# Patient Record
Sex: Female | Born: 1950
Health system: Southern US, Community
[De-identification: ages and names within clinical notes are randomized; demographics above are authoritative.]

## PROBLEM LIST (undated history)

## (undated) DIAGNOSIS — K219 Gastro-esophageal reflux disease without esophagitis: Secondary | ICD-10-CM

## (undated) DIAGNOSIS — R569 Unspecified convulsions: Secondary | ICD-10-CM

## (undated) DIAGNOSIS — R011 Cardiac murmur, unspecified: Secondary | ICD-10-CM

## (undated) DIAGNOSIS — C50919 Malignant neoplasm of unspecified site of unspecified female breast: Secondary | ICD-10-CM

## (undated) DIAGNOSIS — I10 Essential (primary) hypertension: Secondary | ICD-10-CM

## (undated) DIAGNOSIS — E7800 Pure hypercholesterolemia, unspecified: Secondary | ICD-10-CM

## (undated) HISTORY — PX: TUBAL LIGATION: SHX77

## (undated) HISTORY — DX: Cardiac murmur, unspecified: R01.1

## (undated) HISTORY — DX: Pure hypercholesterolemia, unspecified: E78.00

## (undated) HISTORY — DX: Essential (primary) hypertension: I10

## (undated) HISTORY — DX: Unspecified convulsions: R56.9

## (undated) HISTORY — DX: Gastro-esophageal reflux disease without esophagitis: K21.9

---

## 1976-12-21 HISTORY — PX: ABDOMINAL HYSTERECTOMY: SHX81

## 1989-12-21 DIAGNOSIS — C50919 Malignant neoplasm of unspecified site of unspecified female breast: Secondary | ICD-10-CM

## 1989-12-21 HISTORY — DX: Malignant neoplasm of unspecified site of unspecified female breast: C50.919

## 1989-12-21 HISTORY — PX: MASTECTOMY: SHX3

## 2005-05-06 ENCOUNTER — Ambulatory Visit: Payer: Self-pay

## 2006-07-28 ENCOUNTER — Ambulatory Visit: Payer: Self-pay

## 2008-07-10 ENCOUNTER — Ambulatory Visit: Payer: Self-pay

## 2008-07-10 IMAGING — MG MAM BCCCP UNILATERAL  RT W/CAD
1 series · 3 of 3 positions shown · non-contrast
Comparison: none

REASON FOR EXAM: History of left mastectomy in [P0]   [REDACTED]
COMMENTS:

PROCEDURE:     MAM - MAM SNOUN RT BREAST W/CAD  - [DATE]  [DATE]
RESULT:     Comparison is made to prior studies dated [DATE], [DATE]
and [DATE].
The breasts demonstrate a heterogeneous parenchymal pattern. There is no new
radiographic evidence to suggest malignancy.

[R CC · right · 3 of 3 slices shown]
[im 1/3]
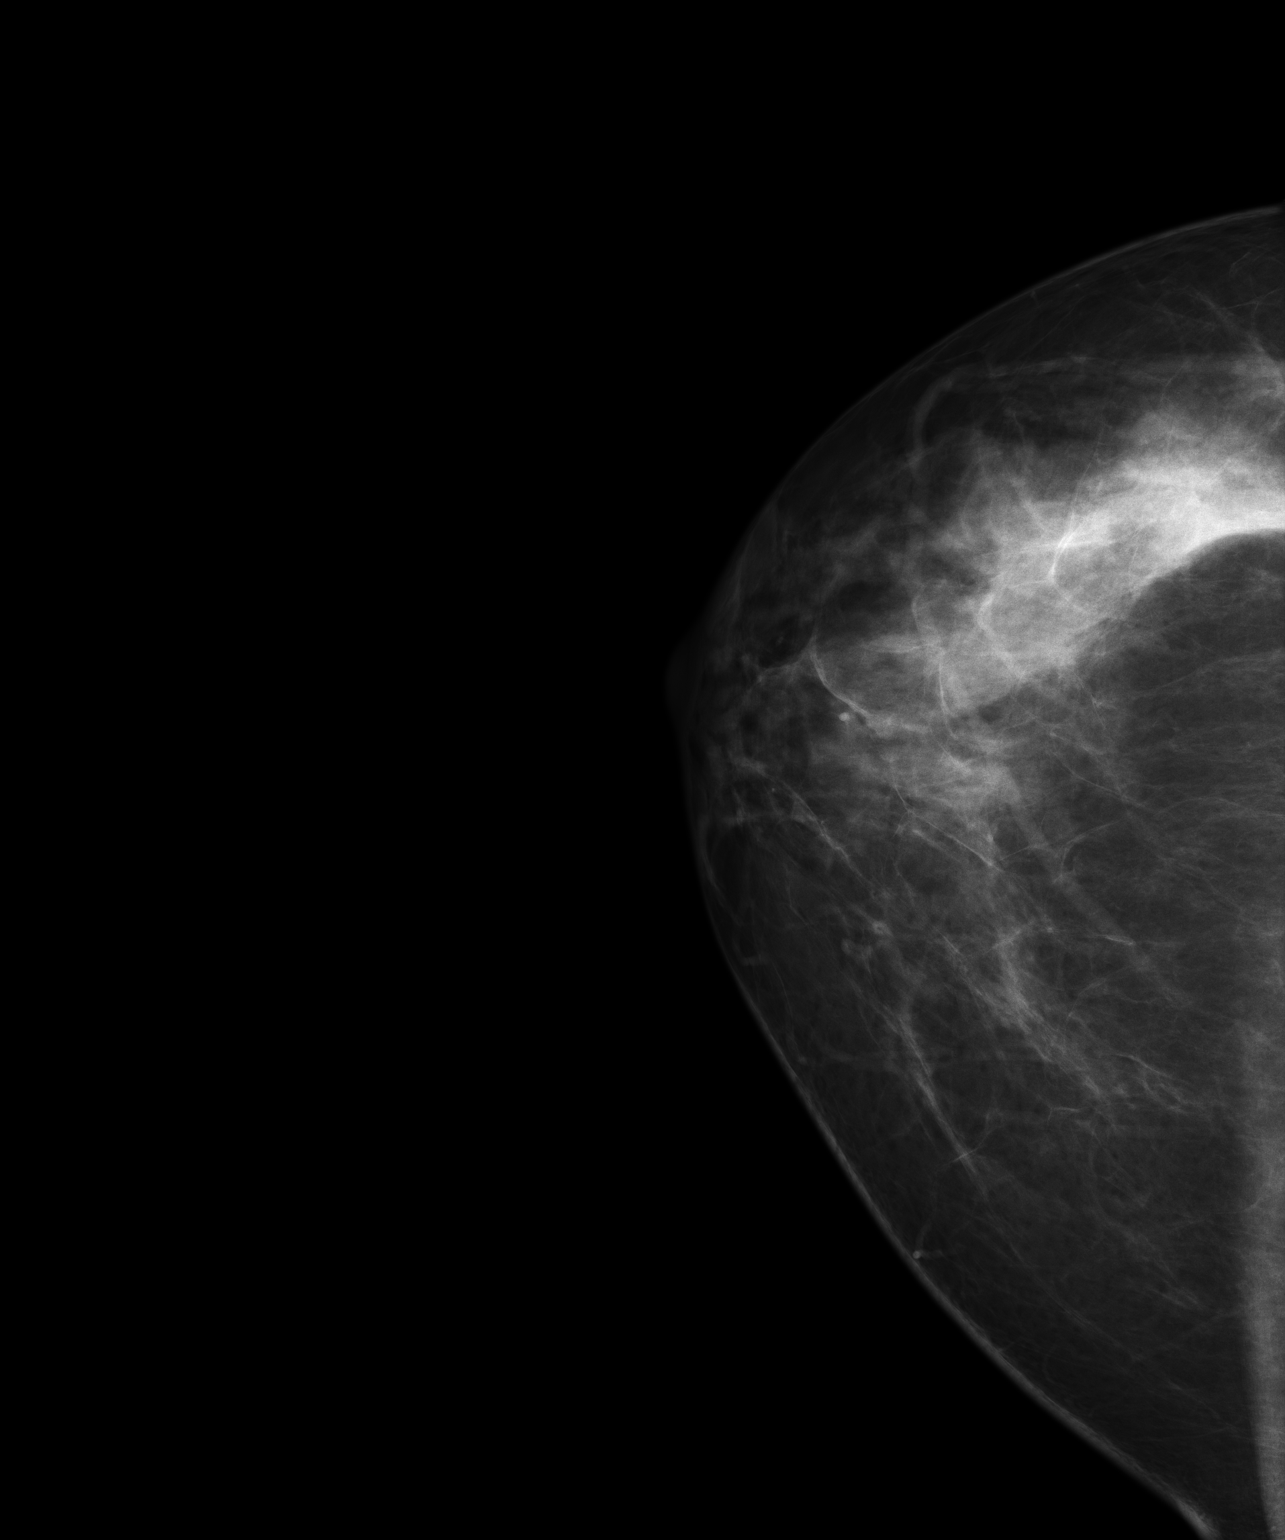
[im 2/3]
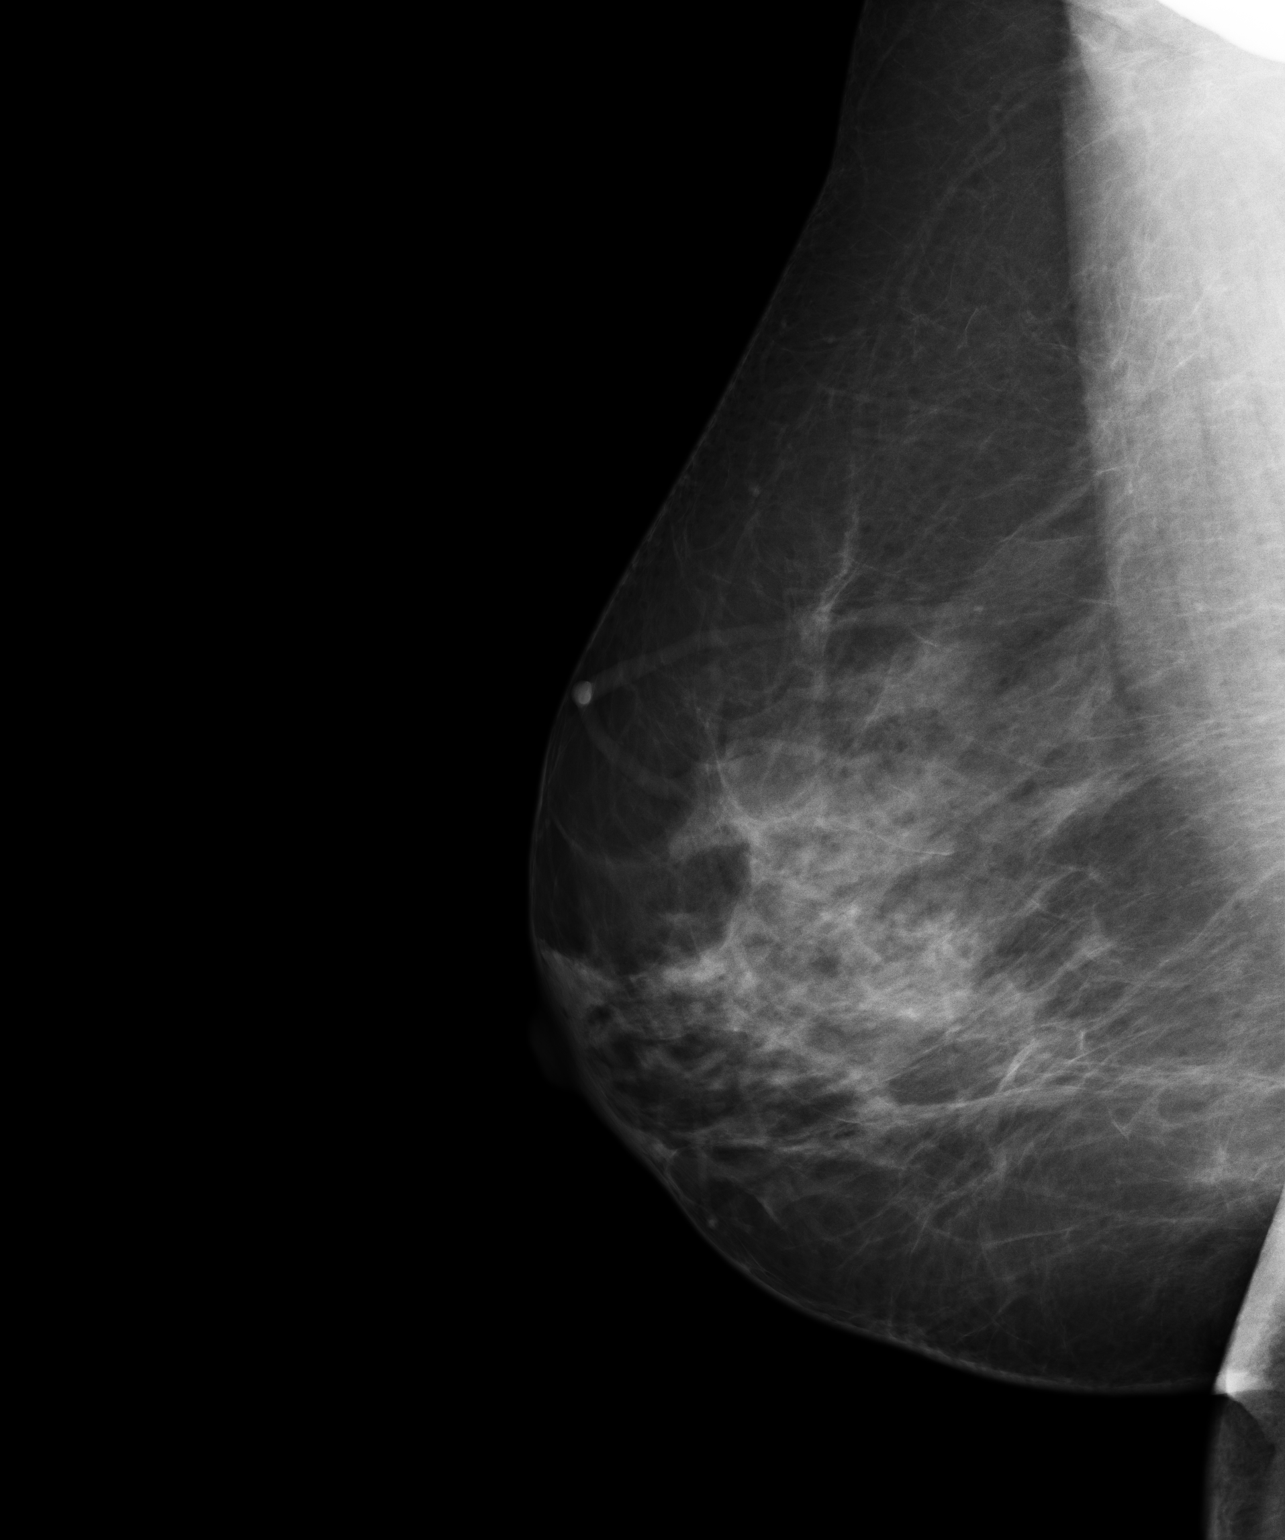
[im 3/3]
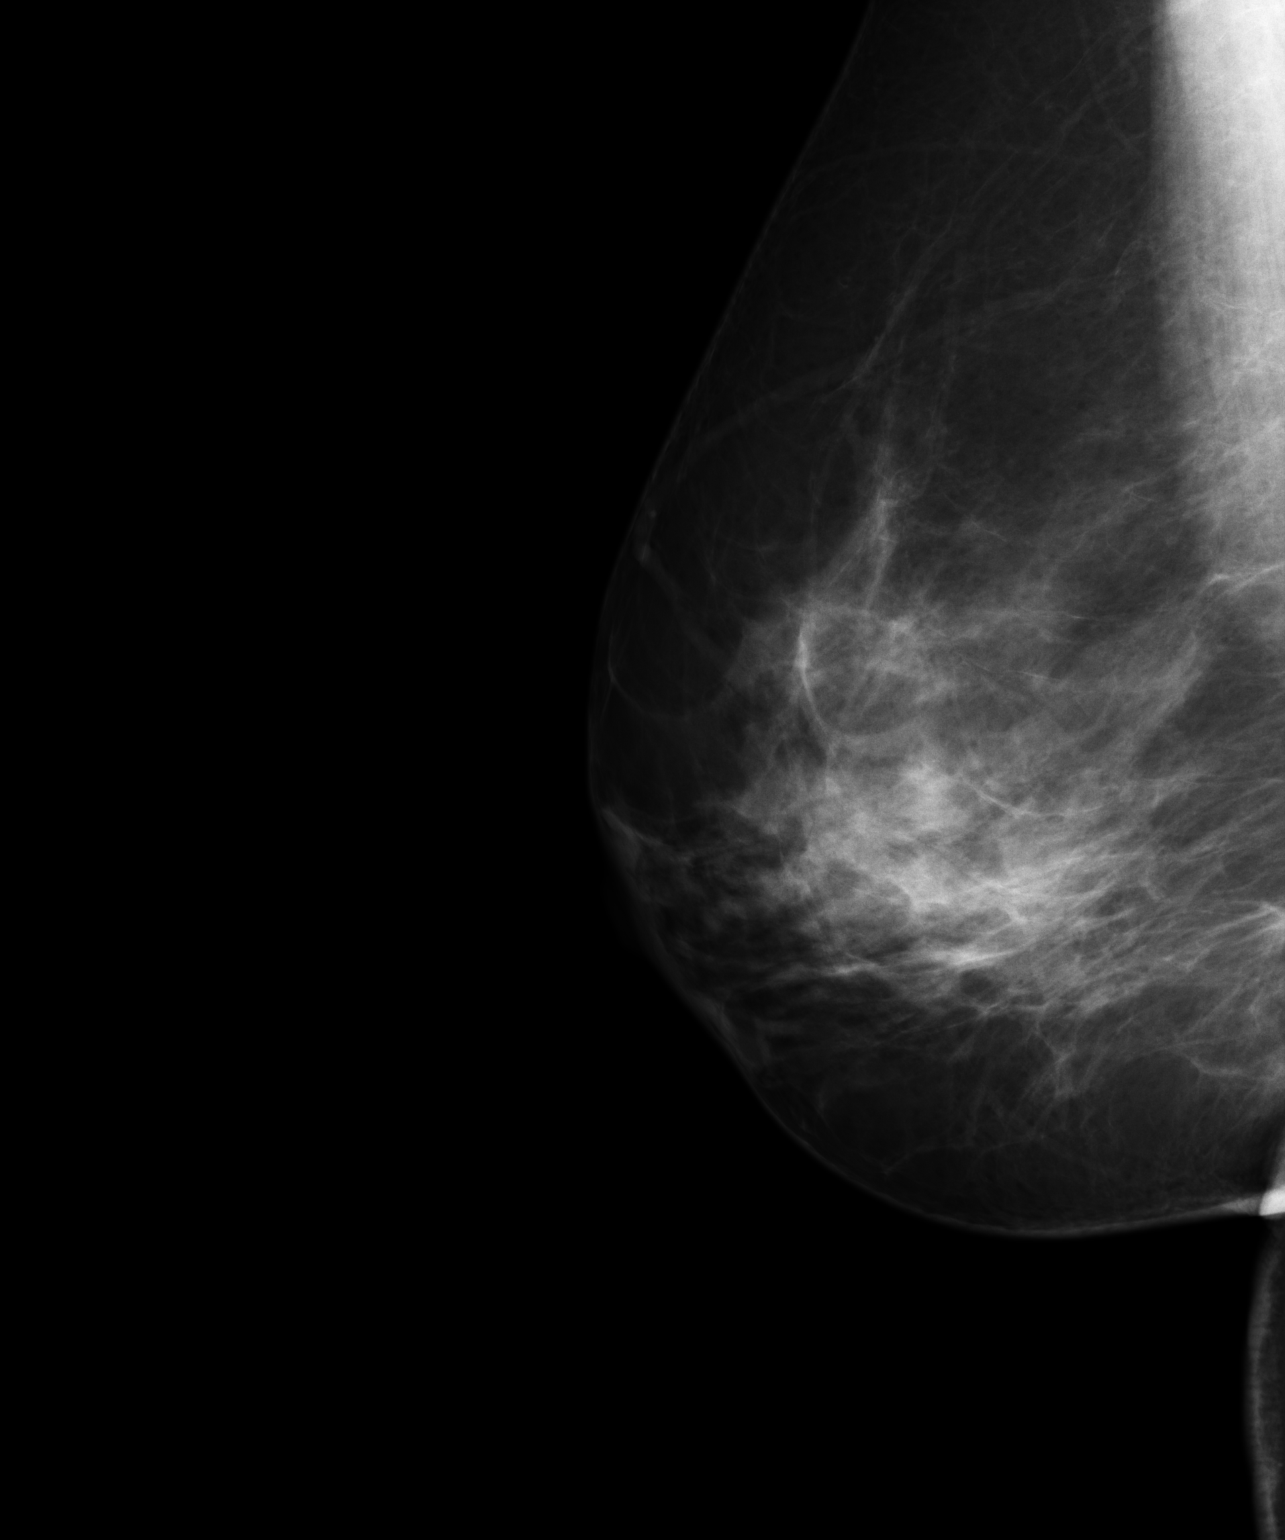

[3 of 3 positions shown; findings below may reference images not displayed]

IMPRESSION: BI-RADS: Category 2 - Benign Finding

Thank you for this opportunity to contribute to the care of your patient.

A NEGATIVE MAMMOGRAM REPORT DOES NOT PRECLUDE BIOPSY OR OTHER EVALUATION OF
A CLINICALLY PALPABLE OR OTHERWISE SUSPICIOUS MASS OR LESION. BREAST CANCER
MAY NOT BE DETECTED BY MAMMOGRAPHY IN UP TO 10% OF CASES.

## 2009-07-30 ENCOUNTER — Ambulatory Visit: Payer: Self-pay

## 2009-07-30 IMAGING — MG MAM BCCCP UNILATERAL RIGHT
1 series · 3 of 3 positions shown · non-contrast
Comparison: none

REASON FOR EXAM: history of left cancer with mastectomy [Y8] [REDACTED] YURIDIA
COMMENTS:

[R CC · right · 3 of 3 slices shown]
[im 1/3]
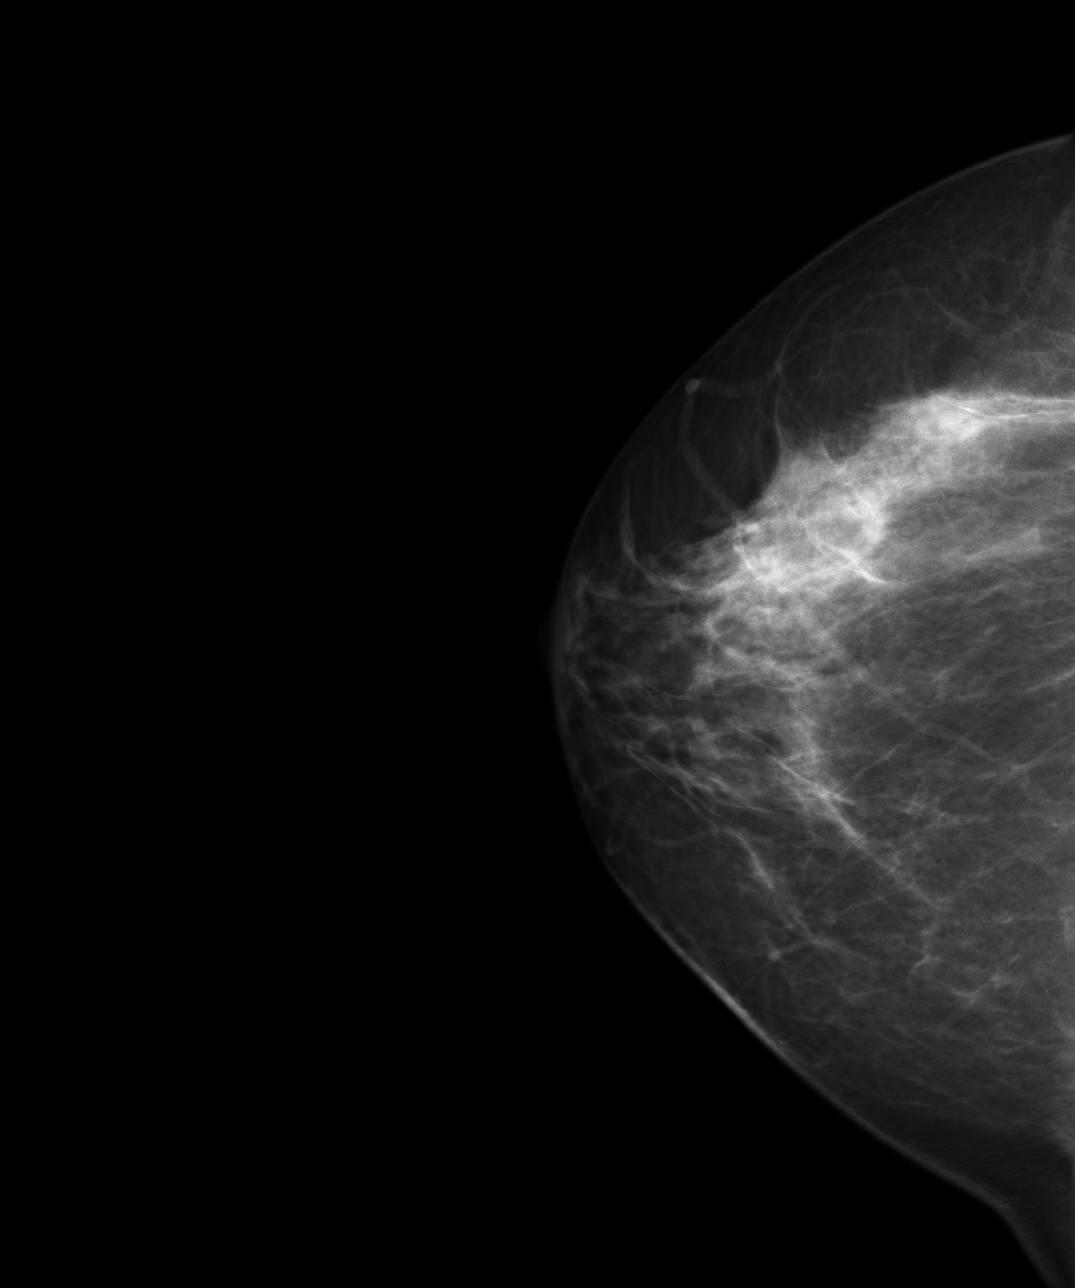
[im 2/3]
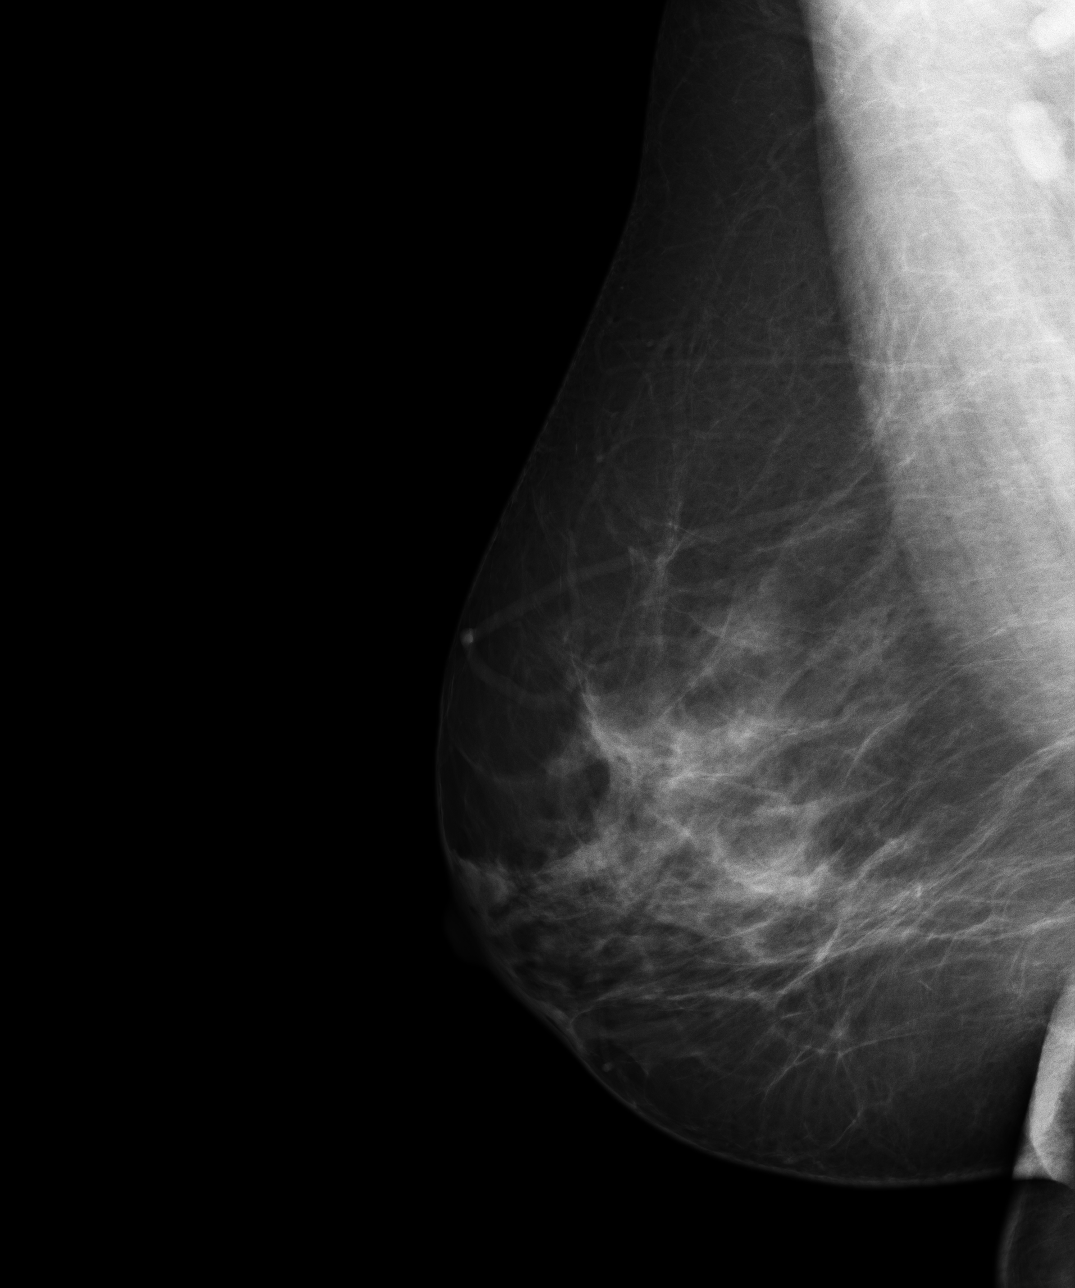
[im 3/3]
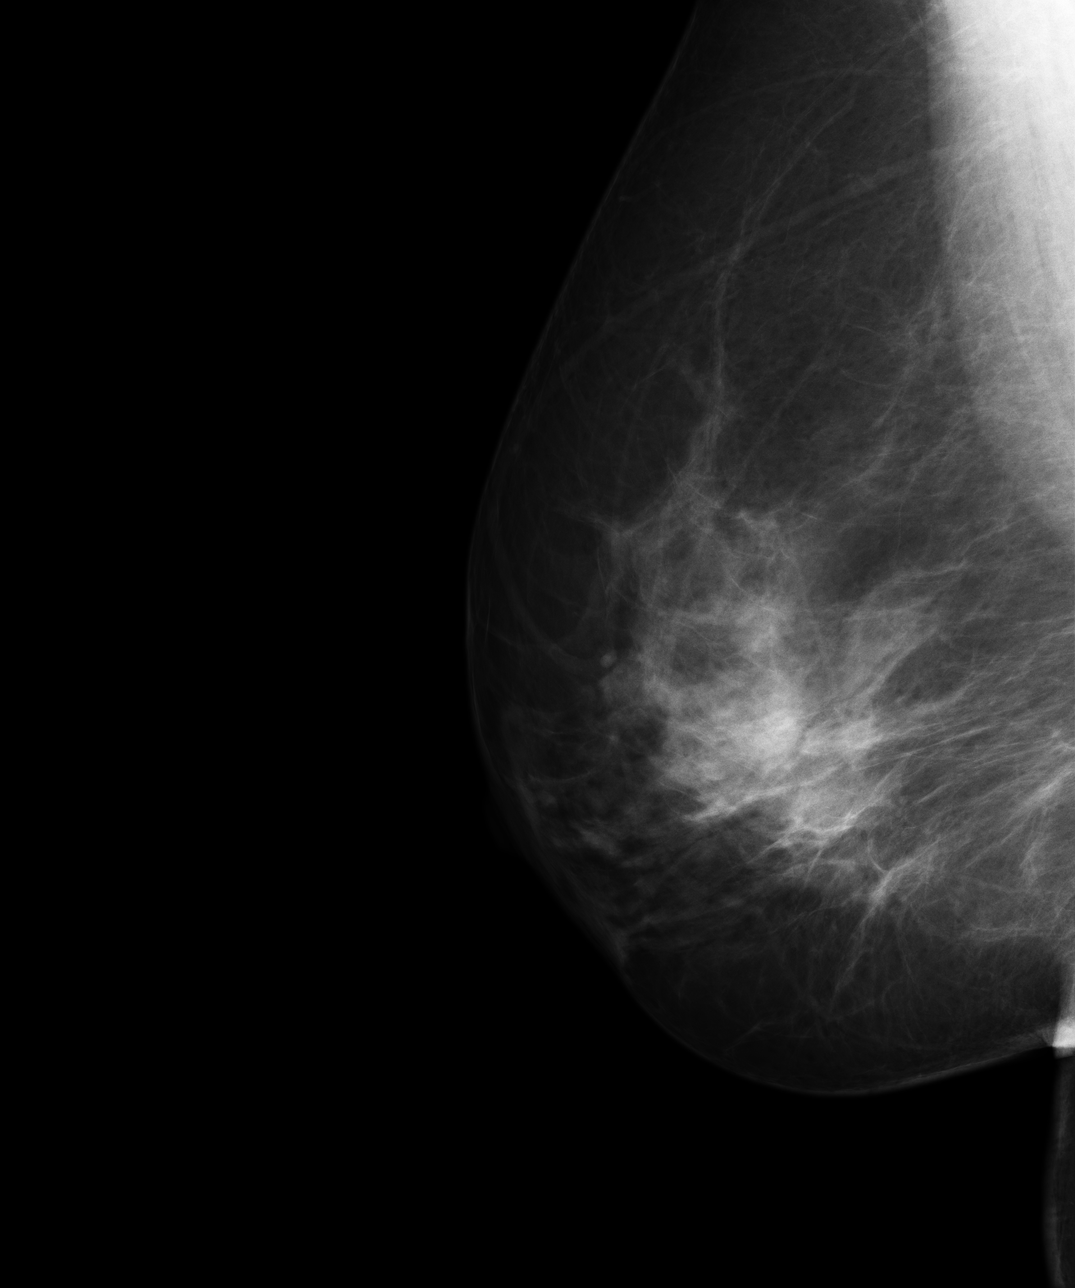

[3 of 3 positions shown; findings below may reference images not displayed]

PROCEDURE:     MAM - MAM YURIDIA RT BREAST W/CAD  - [DATE] [DATE]

RESULT:     Comparison is made to film screen images of [DATE] of the
right breast only and to digital images dated [DATE] as well as
[DATE] and [DATE] of the right breast.

The right breast exhibits a moderately dense parenchymal pattern which
appears to be stable without evidence of an area of developing density or
dominant mass. There is no malignant calcification or architectural
distortion.
IMPRESSION: Stable, benign appearing unilateral right breast mammogram.

BI-RADS: Category 2 - Benign Finding

RECOMMENDATION:  Please continue to encourage annual mammographic follow-up.

Thank you for this opportunity to contribute to the care of your patient.

A NEGATIVE MAMMOGRAM REPORT DOES NOT PRECLUDE BIOPSY OR OTHER EVALUATION OF
A CLINICALLY PALPABLE OR OTHERWISE SUSPICIOUS MASS OR LESION. BREAST CANCER
MAY NOT BE DETECTED BY MAMMOGRAPHY IN UP TO 10% OF CASES.

## 2010-01-31 ENCOUNTER — Emergency Department: Payer: Self-pay | Admitting: Emergency Medicine

## 2010-07-30 ENCOUNTER — Ambulatory Visit: Payer: Self-pay

## 2010-07-30 IMAGING — MG MAM BCCCP DIG UNI RT W/CAD
1 series · 3 of 3 positions shown · non-contrast
Comparison: none

REASON FOR EXAM: history of left mastectomy [1I]
COMMENTS:

[R CC · right · 3 of 3 slices shown]
[im 1/3]
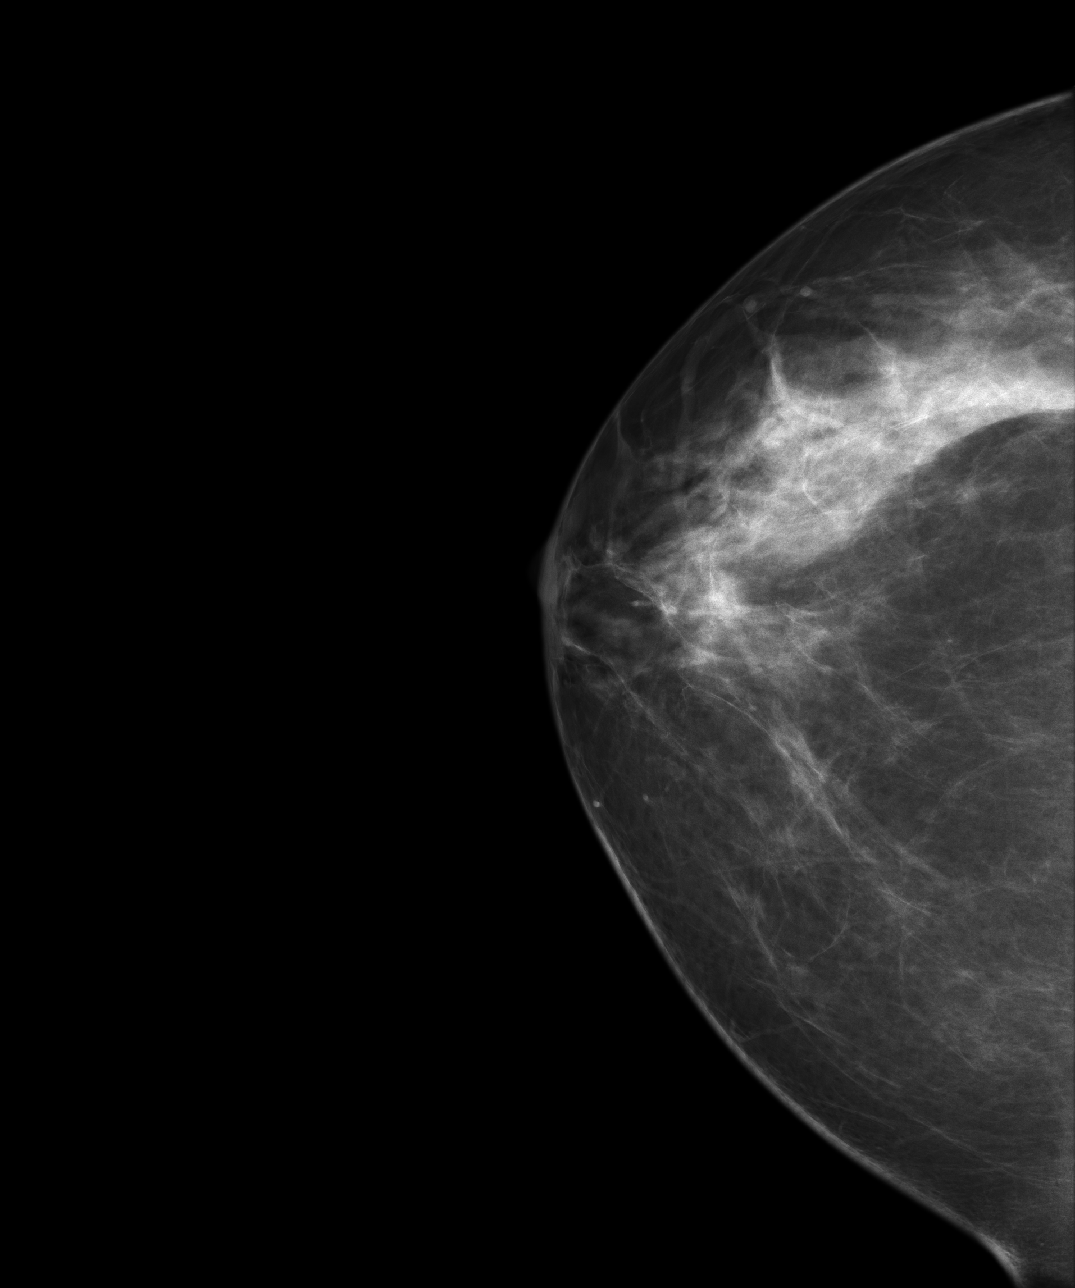
[im 2/3]
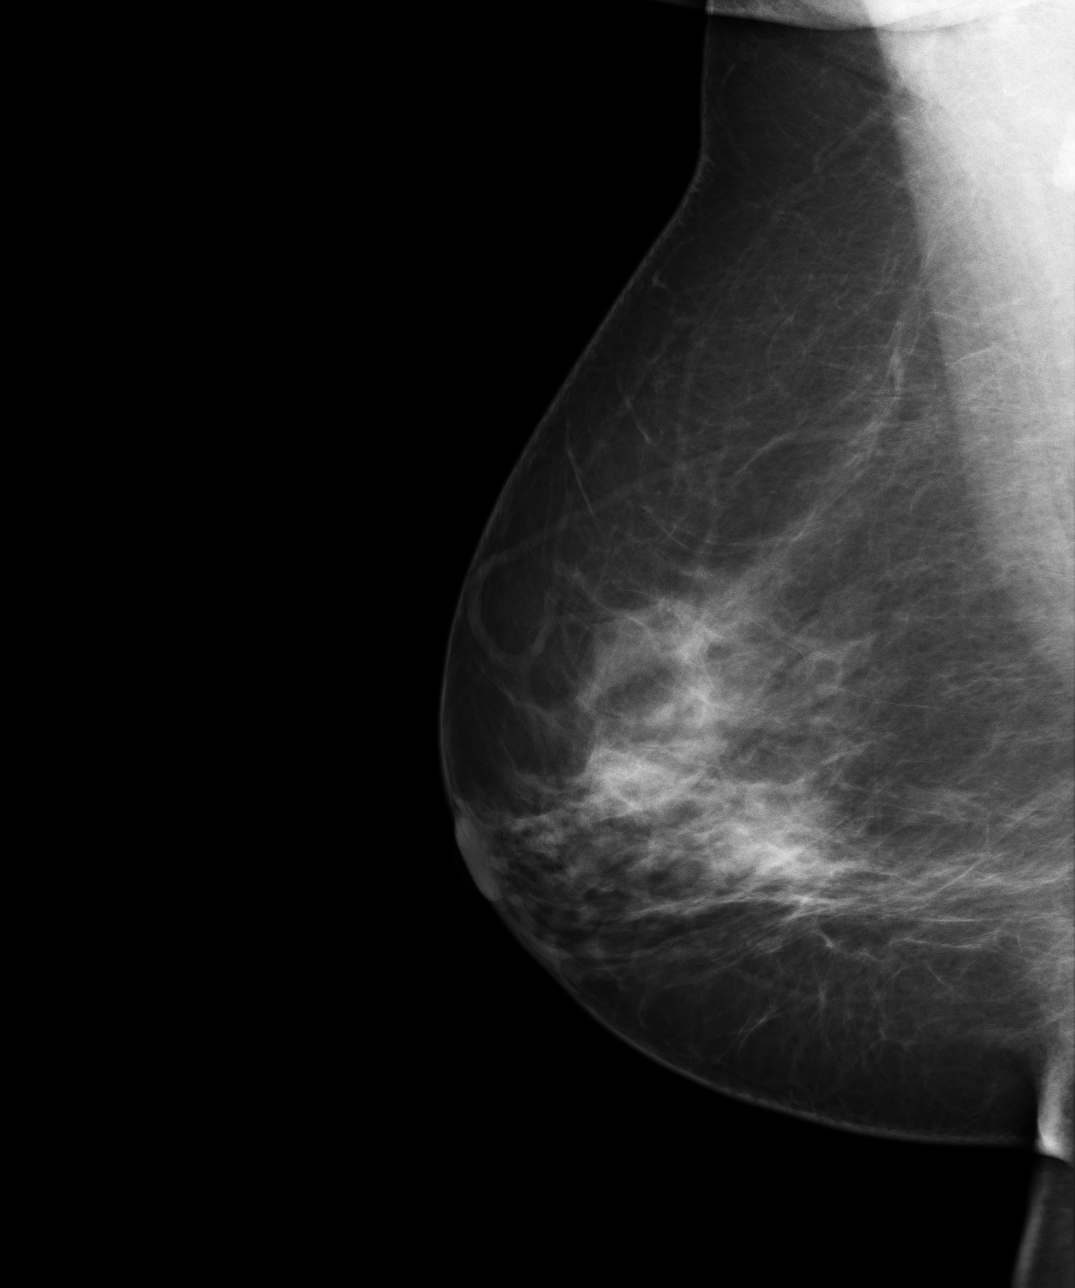
[im 3/3]
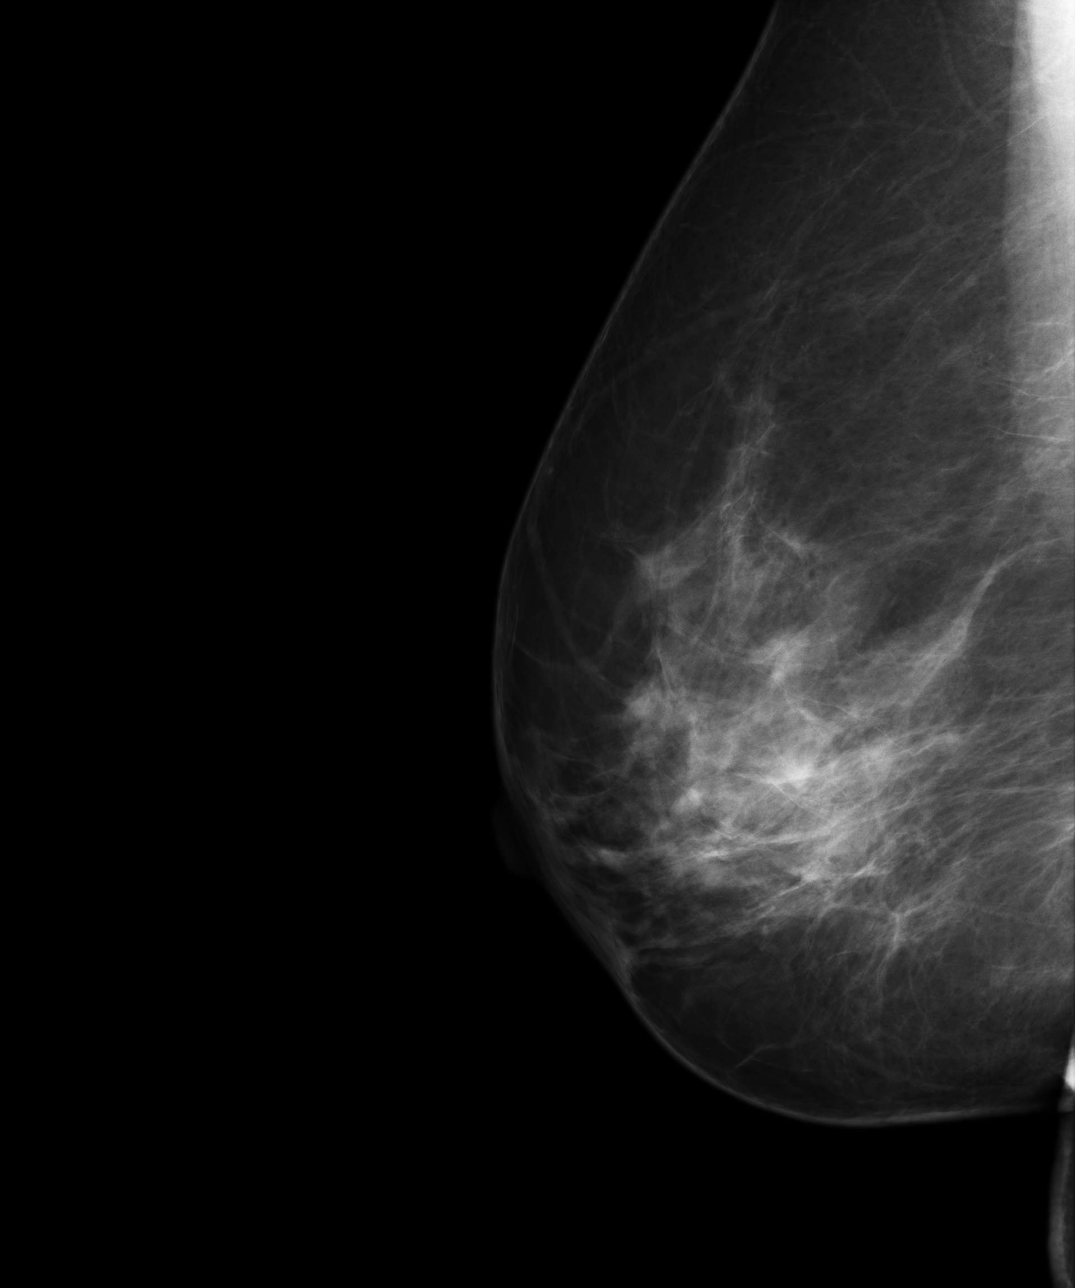

[3 of 3 positions shown; findings below may reference images not displayed]

PROCEDURE:     MAM - MAM [REDACTED] DIG UNI RT W/CAD  - [DATE] [DATE]

RESULT:     Comparison is made to right breast analog images of [DATE], and
to images digitally obtained on [DATE], as well as [DATE] and [DATE].
The breasts exhibit a stable, moderately dense parenchymal pattern without a
developing density, dominant mass or malignant calcification.
IMPRESSION: 1.Stable, benign appearing unilateral right breast mammogram.

BI-RADS: Category 2 - Benign Findings

RECOMMENDATIONS:

1.     Please continue to encourage yearly mammographic follow-up.

A NEGATIVE MAMMOGRAM REPORT DOES NOT PRECLUDE BIOPSY OR OTHER EVALUATION OF
A CLINICALLY PALPABLE OR OTHERWISE SUSPICIOUS MASS OR LESION. BREAST CANCER
MAY NOT BE DETECTED BY MAMMOGRAPHY IN UP TO 10% OF CASES.

## 2010-08-03 ENCOUNTER — Emergency Department: Payer: Self-pay | Admitting: Internal Medicine

## 2012-09-22 ENCOUNTER — Ambulatory Visit: Payer: Self-pay

## 2012-09-22 IMAGING — MG MM MAMMO DIAGNOSTIC UNILATERAL*R*
1 series · 3 of 3 positions shown · non-contrast
Comparison: none

REASON FOR EXAM: HX BRST CA left mast
COMMENTS:

PROCEDURE:     MAM - MAM DGTL UNI MAM RT BREAST W/CAD  - [DATE]  [DATE]
RESULT:     COMPARISON:  [DATE], [DATE], [DATE]
TECHNIQUE: Digital right mammograms were obtained. FDA approved
computer-aided detection (CAD) for mammography was utilized for this study.

[R CC · right · 3 of 3 slices shown]
[im 1/3]
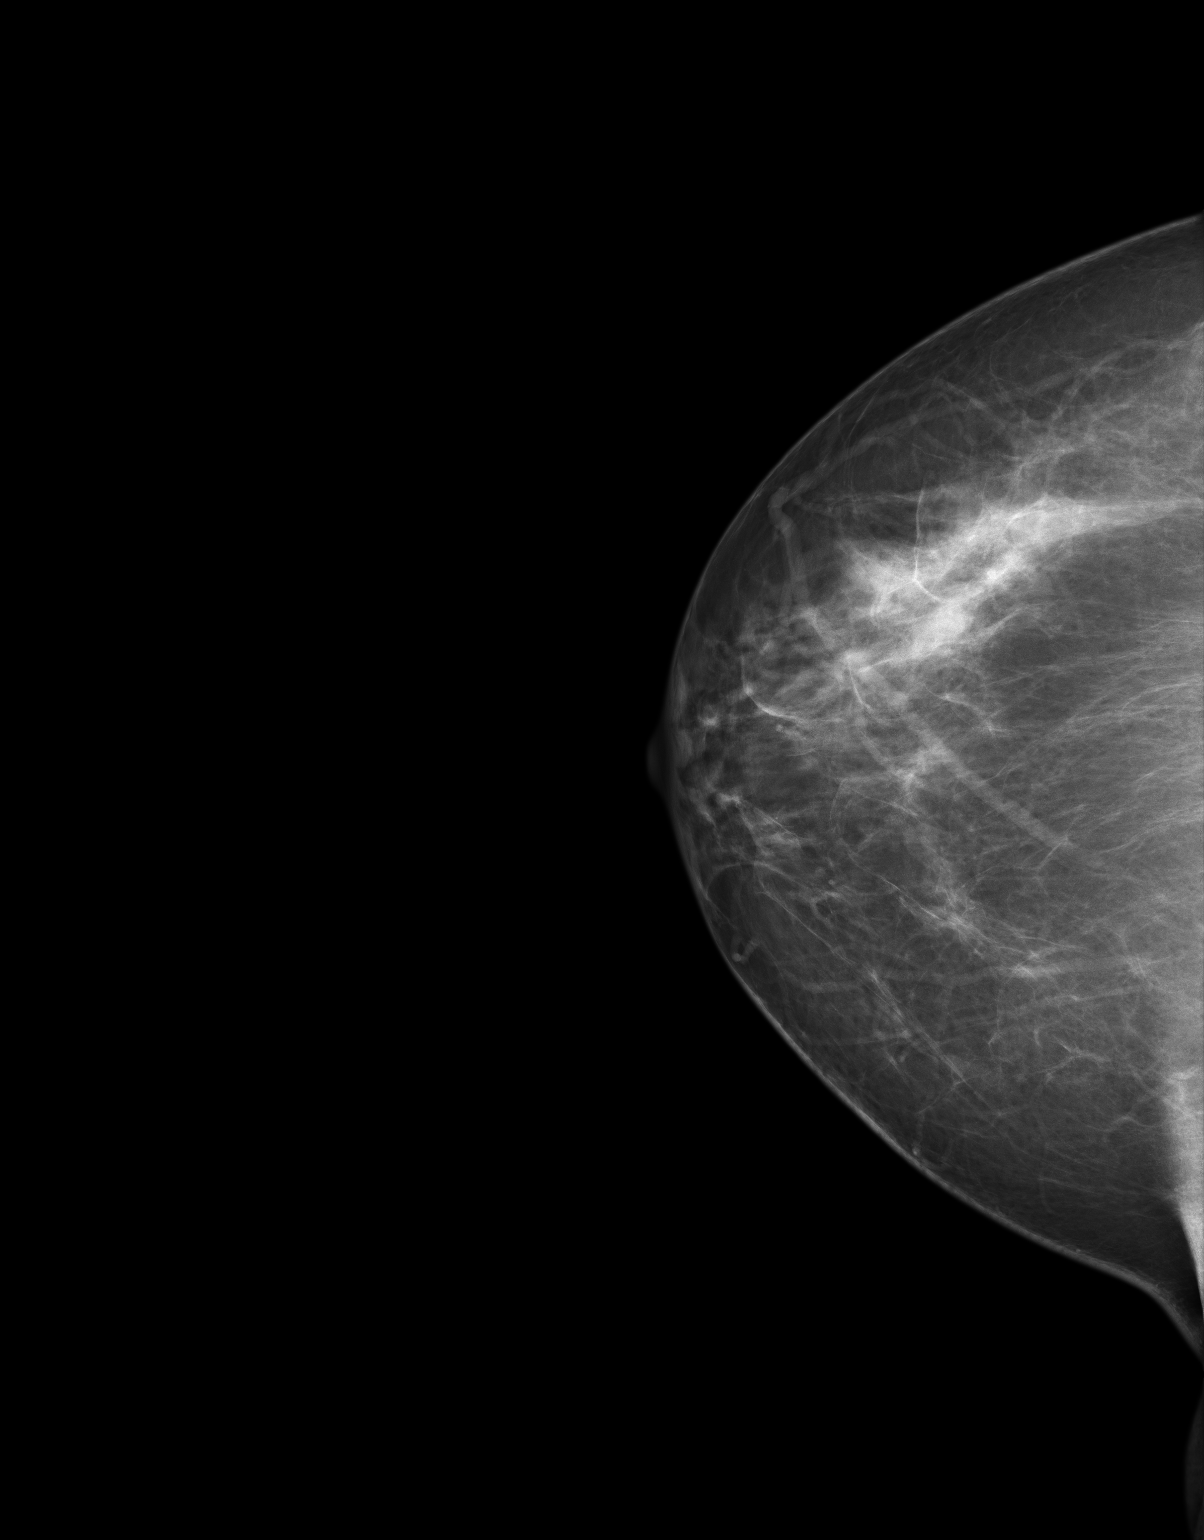
[im 2/3]
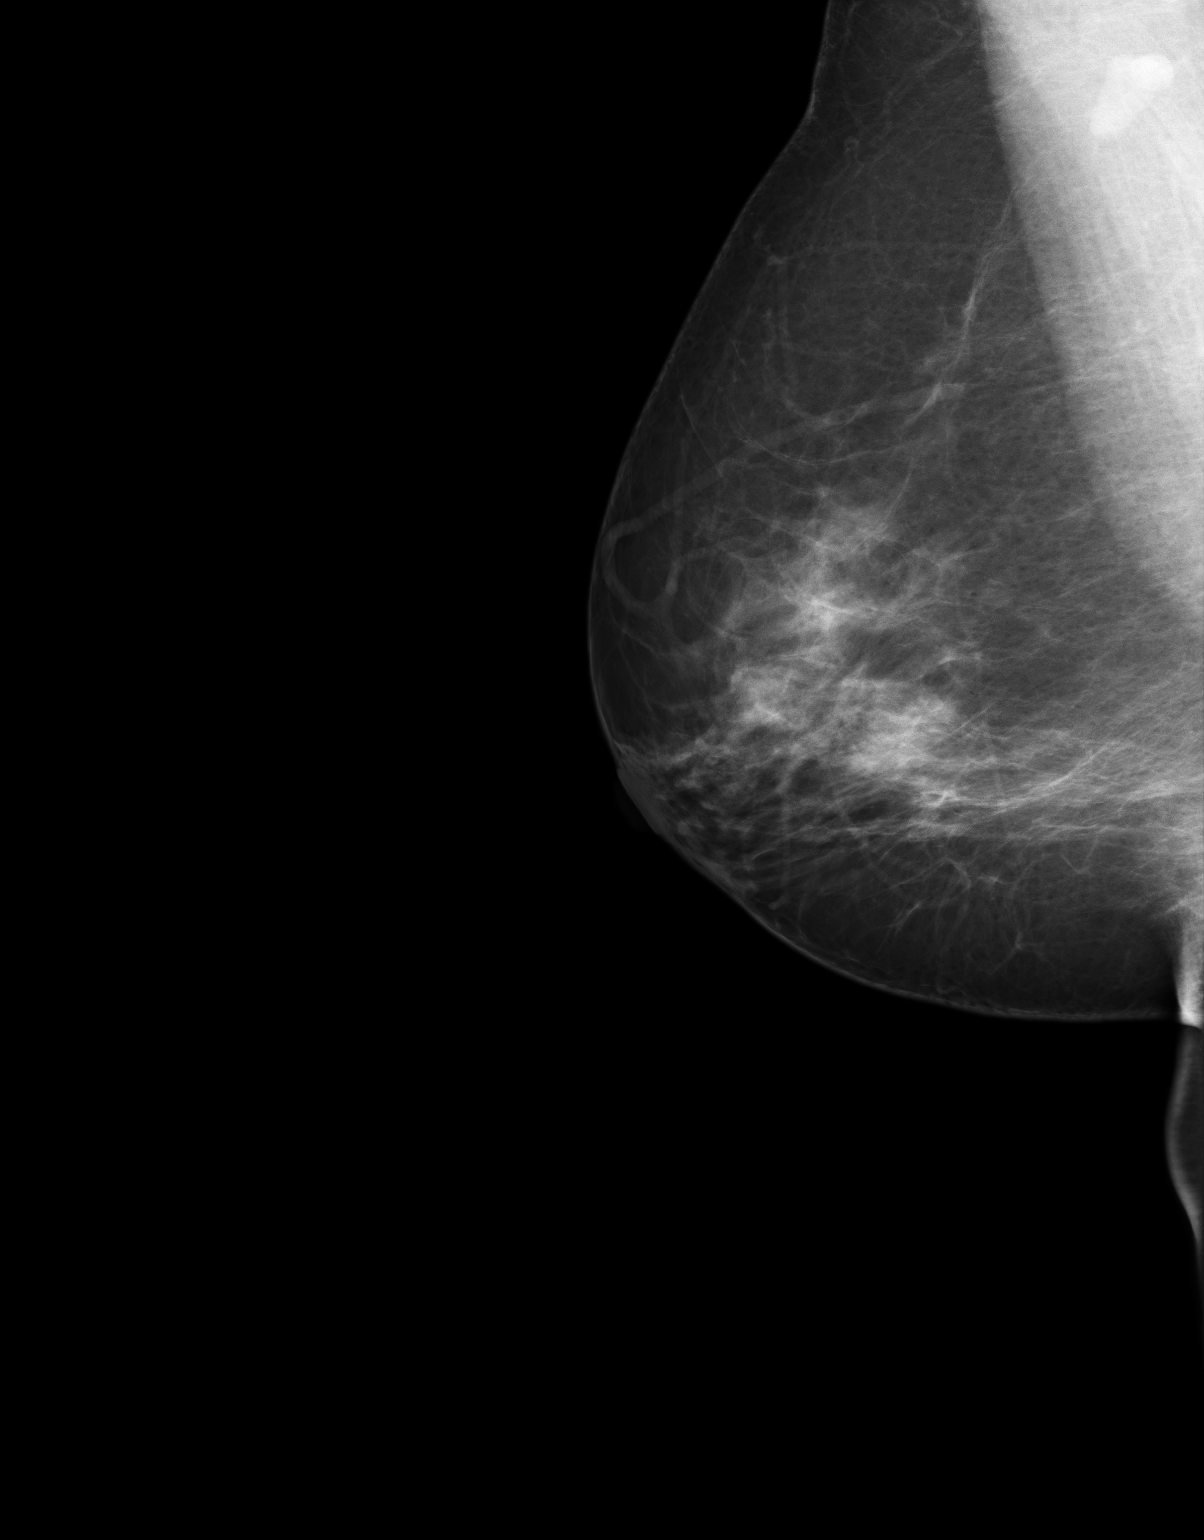
[im 3/3]
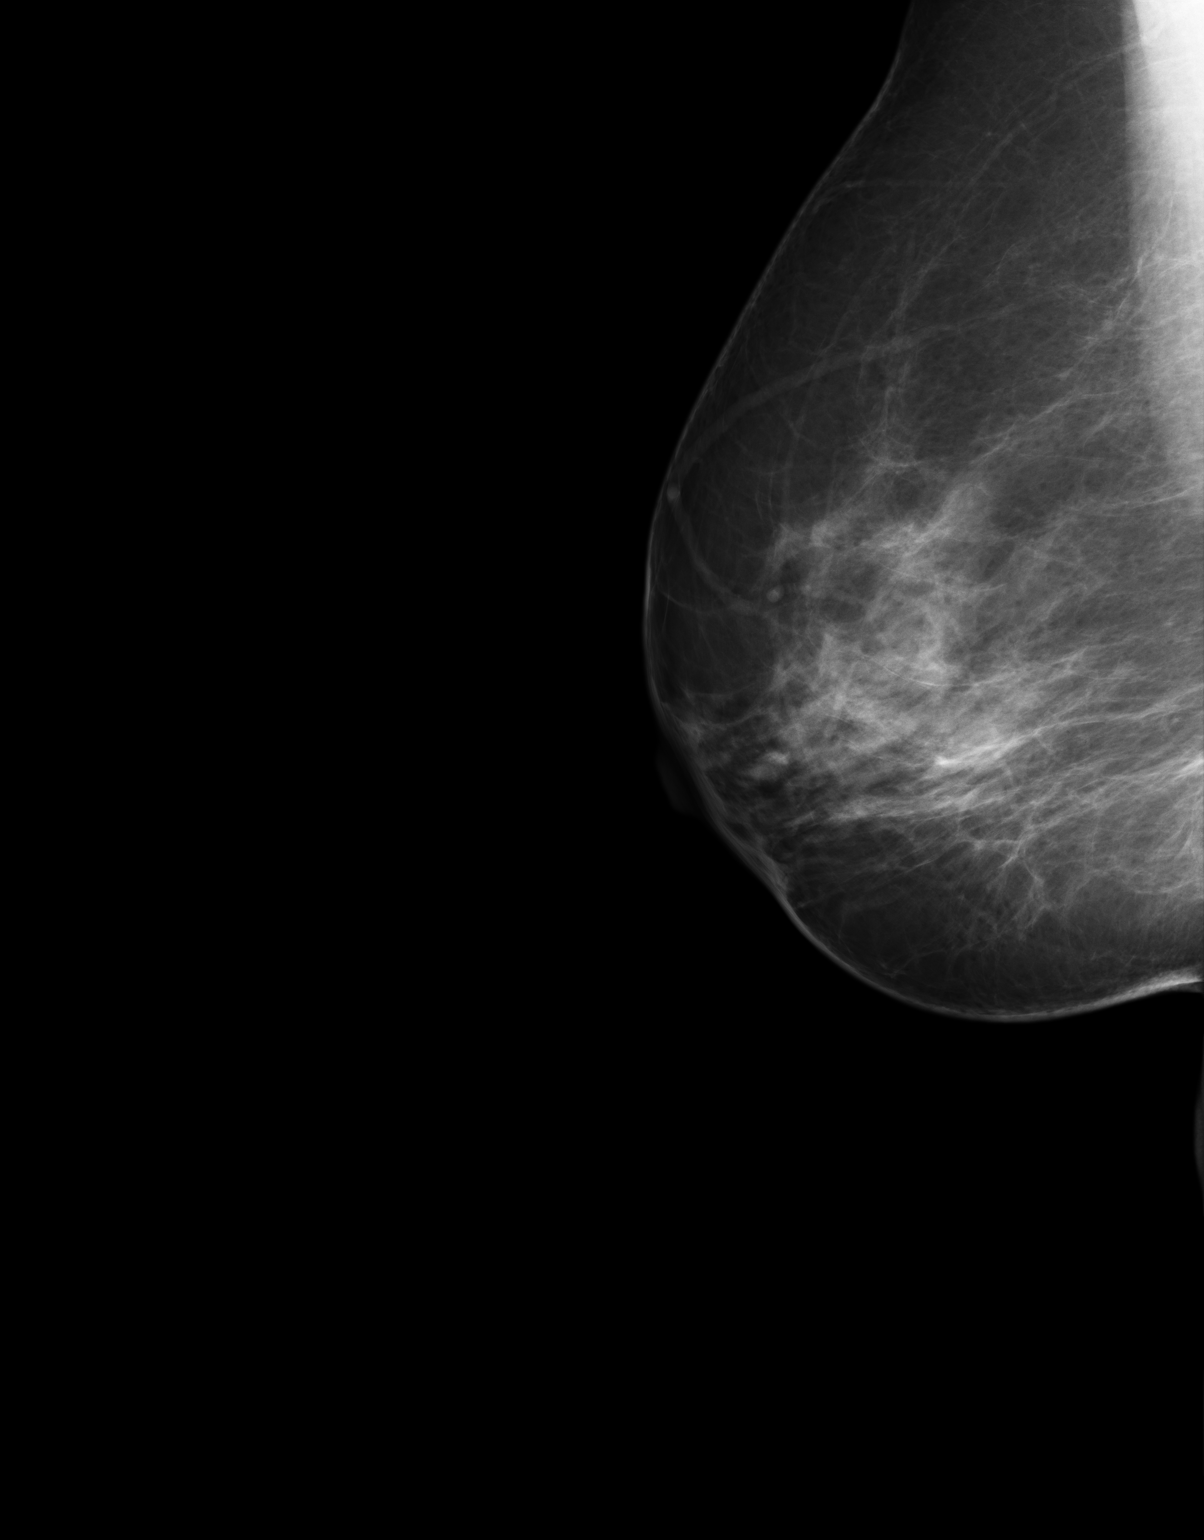

[3 of 3 positions shown; findings below may reference images not displayed]

FINDING: The right breast is heterogeneously dense which may lower the sensitivity of
mammography.  There is no dominant mass, architectural distortion or
clusters of suspicious microcalcifications.
IMPRESSION: 1.     Stable right breast mammogram.
2.     Annual mammographic follow up recommended.
3.     BI-RADS:  Category 2- Benign.

A negative mammogram report does not preclude biopsy or other evaluation of
a clinically palpable or otherwise suspicious mass or lesion. Breast cancer
may not be detected by mammography in up to 10% of cases.

[REDACTED]

## 2013-01-16 ENCOUNTER — Ambulatory Visit: Payer: Self-pay | Admitting: Gastroenterology

## 2013-09-25 ENCOUNTER — Ambulatory Visit: Payer: Self-pay

## 2013-09-25 IMAGING — MG MM MAMMO SCREENING UNILAT*R*
1 series · 2 of 2 positions shown · non-contrast
Comparison: Previous exam(s).

REASON FOR EXAM: SCR MAMMO NO ORDER
COMMENTS:

PROCEDURE:     MAM - MAM DGTL UNI SCREEN W/CAD RIGHT  - [DATE]  [DATE]
CLINICAL DATA: Screening.
EXAM:
DIGITAL SCREENING UNILATERAL RIGHT MAMMOGRAM

[R CC · right · 2 of 2 slices shown]
[im 1/2]
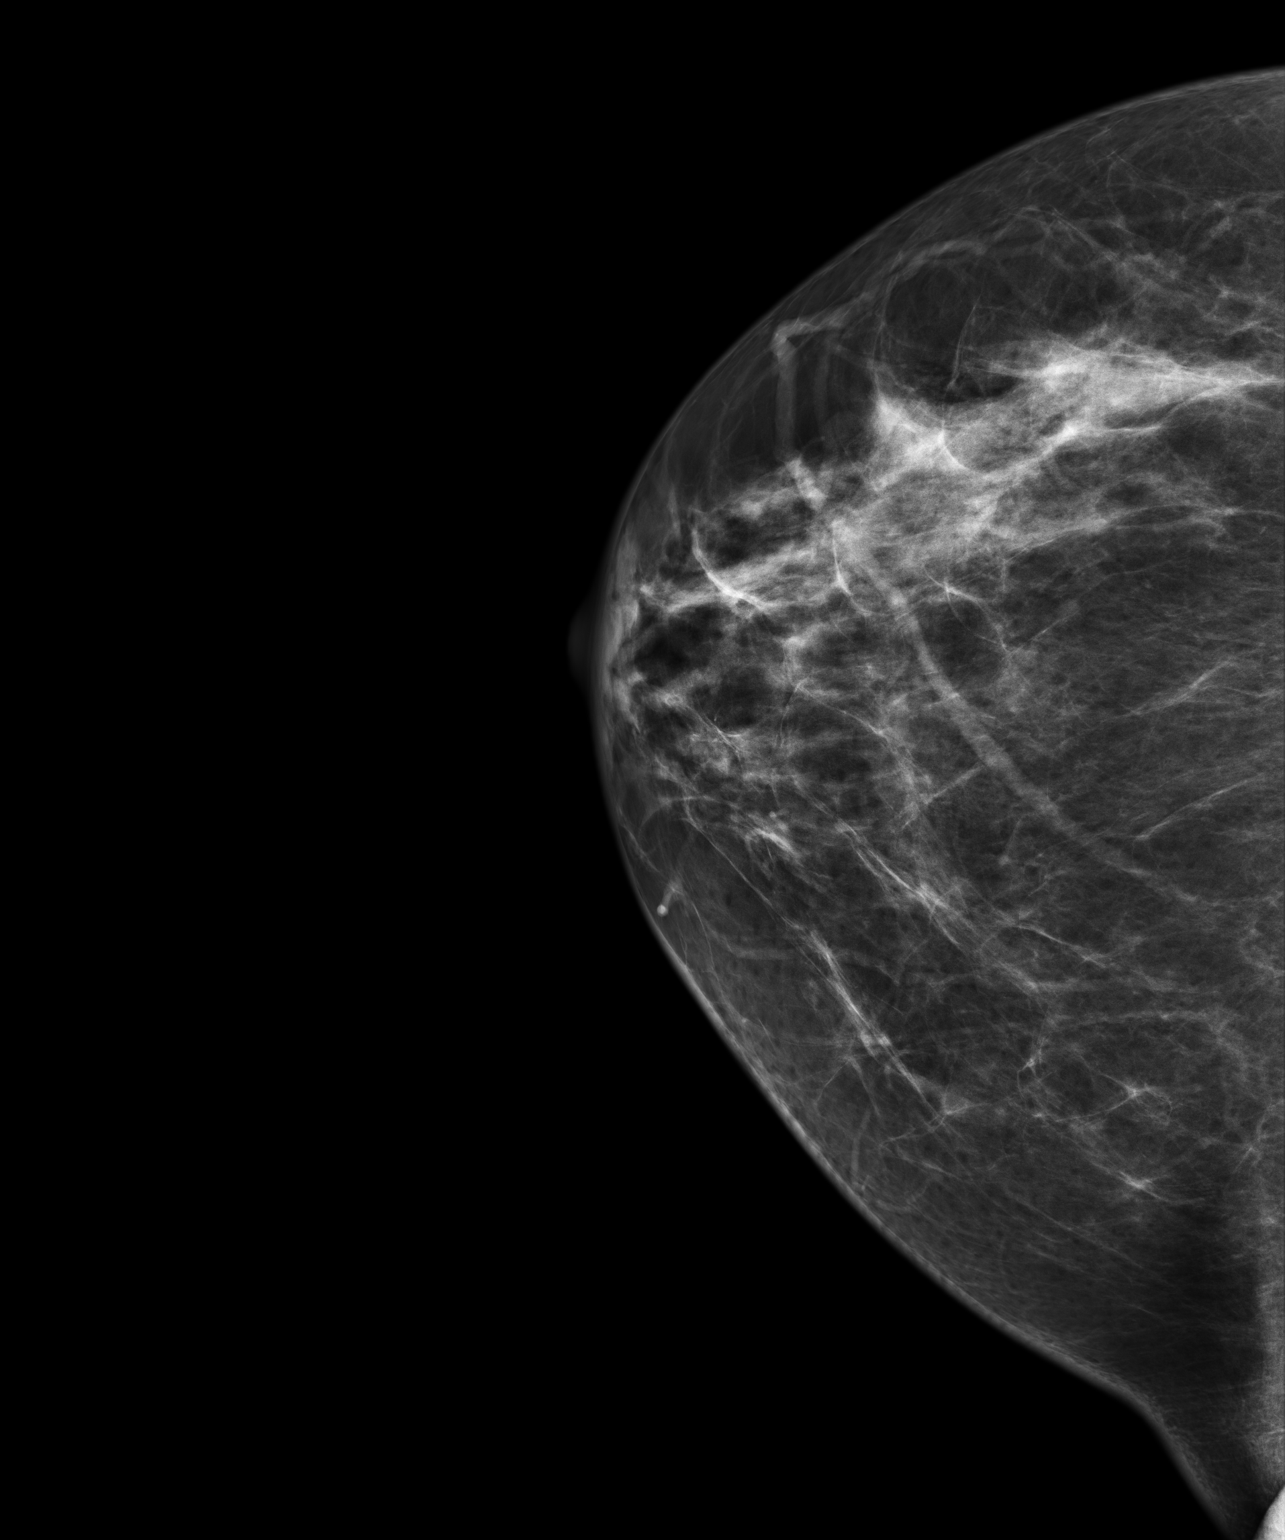
[im 2/2]
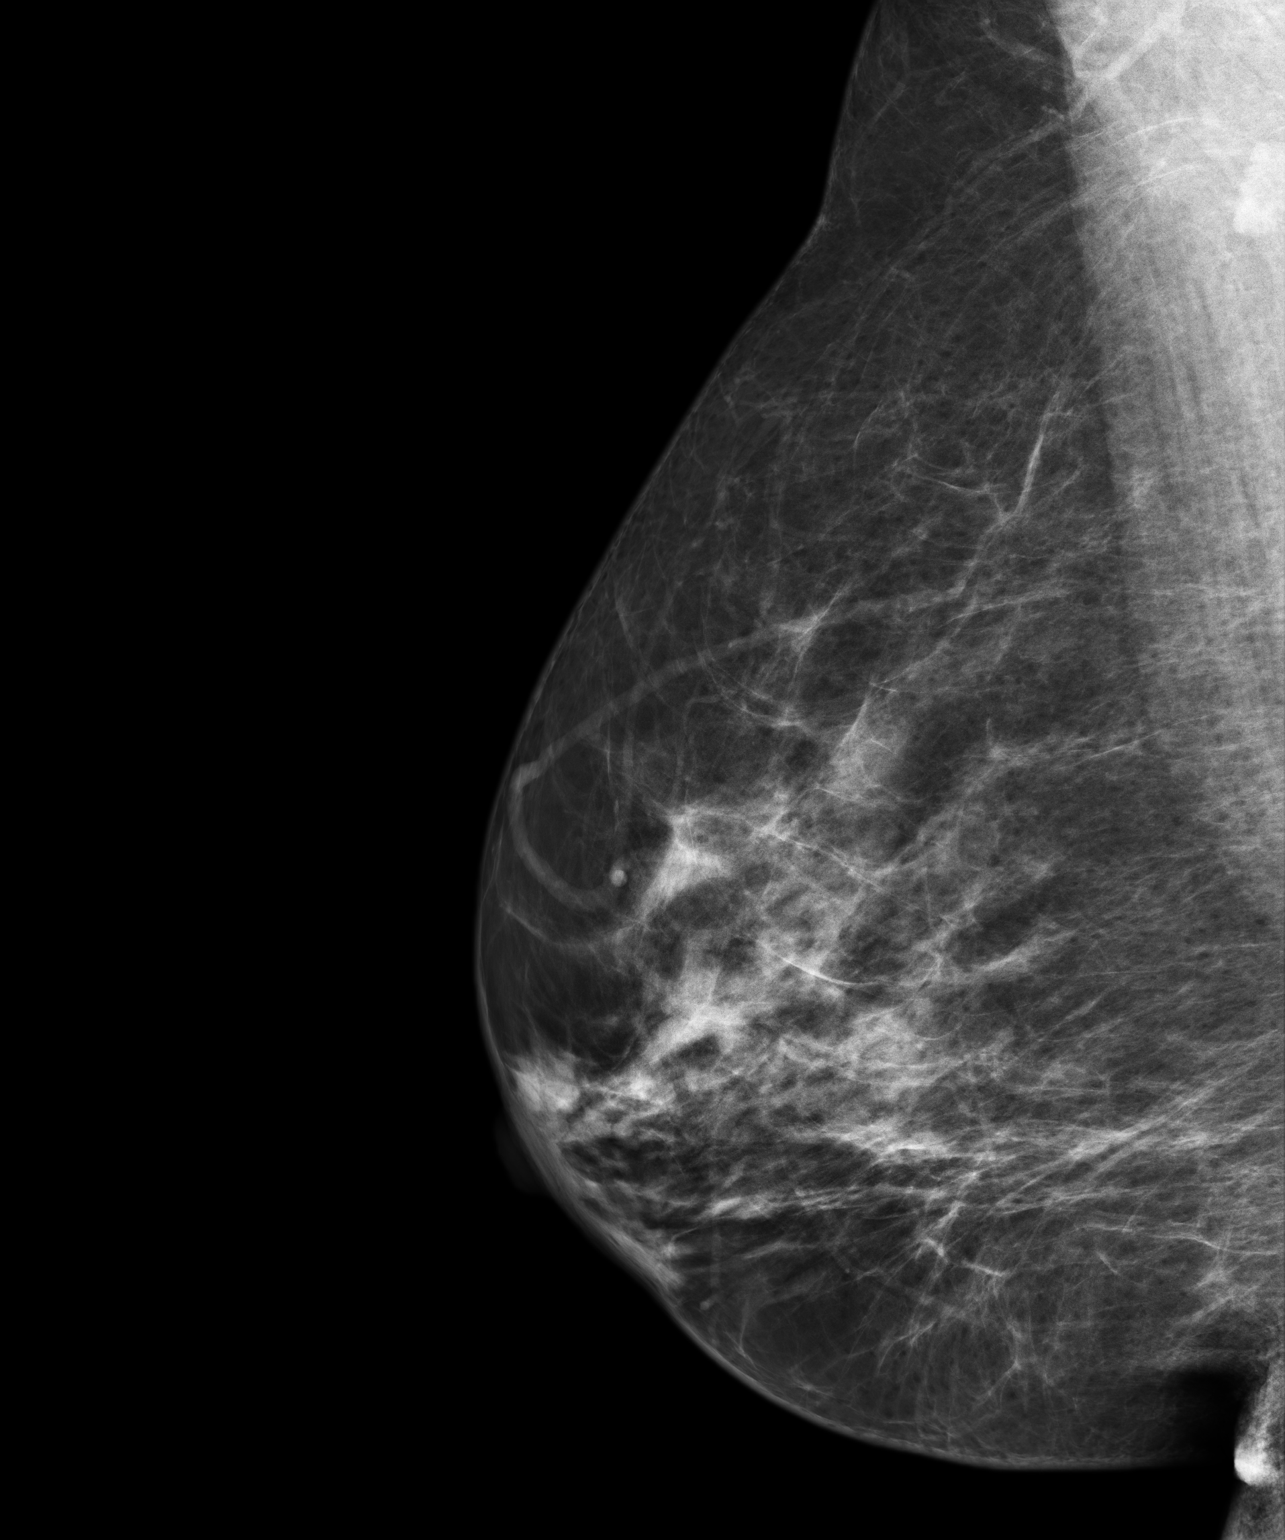

[2 of 2 positions shown; findings below may reference images not displayed]

ACR Breast Density Category b: There are scattered areas of
fibroglandular density.
FINDINGS: There are no findings suspicious for malignancy.
IMPRESSION: No mammographic evidence of malignancy. A result letter of this
screening mammogram will be mailed directly to the patient.

RECOMMENDATION:
Screening mammogram in one year. (Code:[SV])

BI-RADS CATEGORY  1: Negative

## 2014-08-13 ENCOUNTER — Ambulatory Visit: Payer: Self-pay | Admitting: Podiatry

## 2014-09-03 ENCOUNTER — Ambulatory Visit: Payer: Self-pay | Admitting: Podiatry

## 2015-04-08 ENCOUNTER — Ambulatory Visit
Admit: 2015-04-08 | Disposition: A | Discharge status: Home / Self Care | Payer: Self-pay | Attending: Unknown Physician Specialty | Admitting: Unknown Physician Specialty

## 2015-04-08 IMAGING — MG MM MAMMO SCREENING UNILAT*R*
1 series · 2 of 2 positions shown · non-contrast
Comparison: Previous exam(s).

CLINICAL DATA: Screening.

EXAM:
DIGITAL SCREENING UNILATERAL RIGHT MAMMOGRAM

[R CC · right · 2 of 2 slices shown]
[im 1/2]
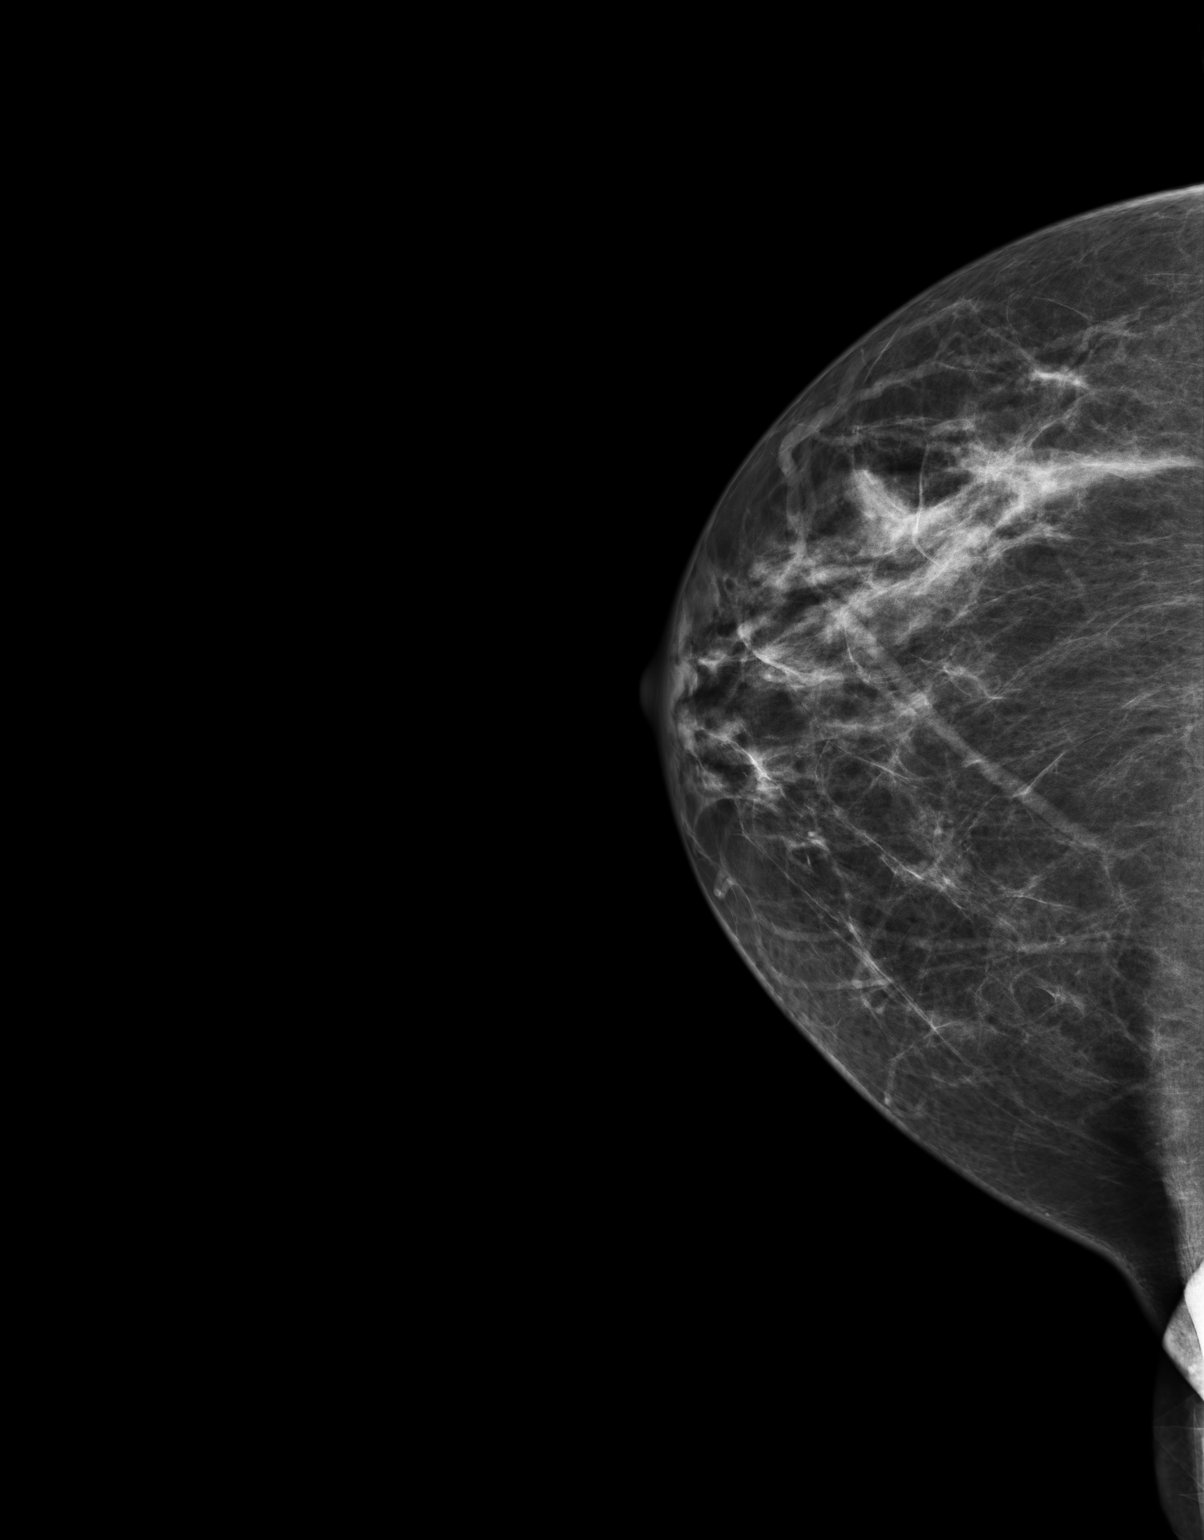
[im 2/2]
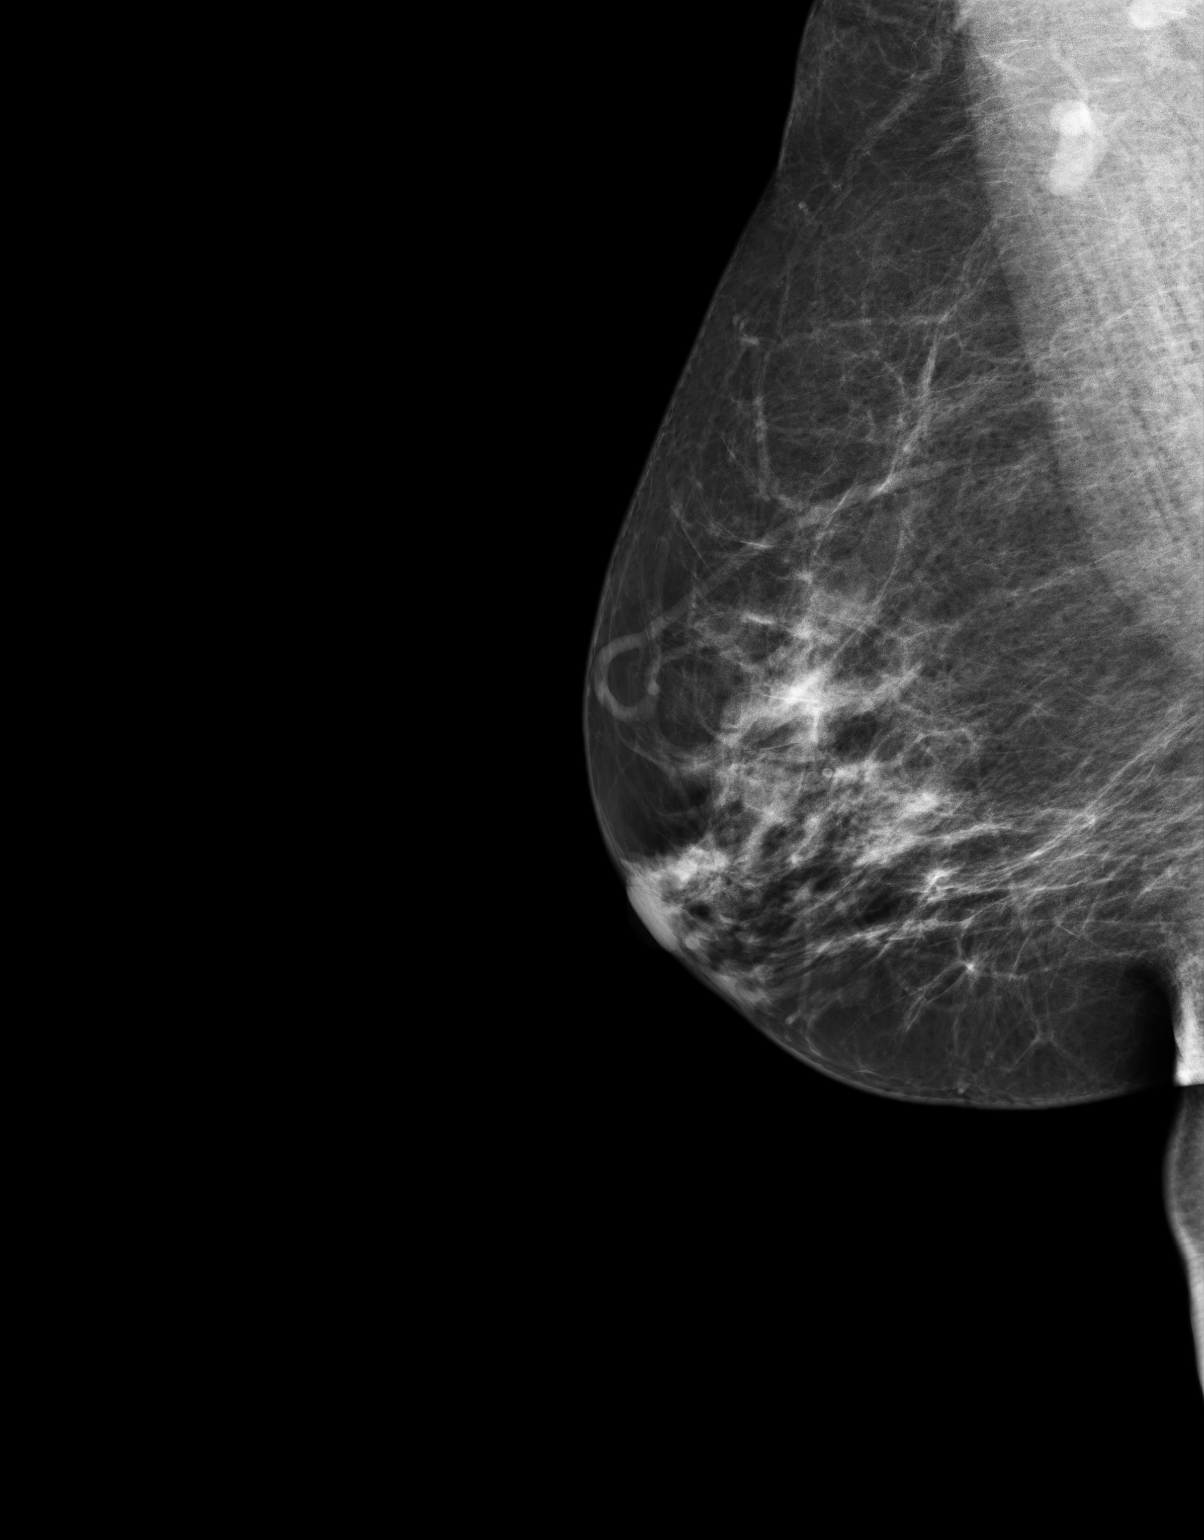

[2 of 2 positions shown; findings below may reference images not displayed]

ACR Breast Density Category c: The breast tissue is heterogeneously
dense, which may obscure small masses.
FINDINGS: In the right breast, a possible asymmetry warrants further
evaluation. In the left breast, no findings suspicious for
malignancy.
IMPRESSION: Further evaluation is suggested for possible asymmetry in the right
breast.

RECOMMENDATION:
Diagnostic mammogram and possibly ultrasound of the right breast.
(Code:[RF])

The patient will be contacted regarding the findings, and additional
imaging will be scheduled.

BI-RADS CATEGORY  0: Incomplete. Need additional imaging evaluation
and/or prior mammograms for comparison.

## 2015-04-10 ENCOUNTER — Other Ambulatory Visit: Payer: Self-pay | Admitting: Unknown Physician Specialty

## 2015-04-10 DIAGNOSIS — N6489 Other specified disorders of breast: Secondary | ICD-10-CM

## 2015-04-22 ENCOUNTER — Ambulatory Visit
Admission: RE | Admit: 2015-04-22 | Discharge: 2015-04-22 | Disposition: A | Discharge status: Home / Self Care | Payer: BLUE CROSS/BLUE SHIELD | Source: Ambulatory Visit | Attending: Unknown Physician Specialty | Admitting: Unknown Physician Specialty

## 2015-04-22 DIAGNOSIS — N6489 Other specified disorders of breast: Secondary | ICD-10-CM

## 2015-04-22 IMAGING — MG MM DIAG BREAST TOMO UNI RIGHT
6 series · 6 of 14 positions shown · non-contrast
Comparison: [DATE] and prior mammograms dating back to
[DATE].

CLINICAL DATA: 63-year-old female with possible outer right breast
mass on screening mammogram. History of left breast cancer and left
mastectomy in [ZR].

EXAM:
DIGITAL DIAGNOSTIC RIGHT MAMMOGRAM WITH 3D TOMOSYNTHESIS AND CAD

[R CC]
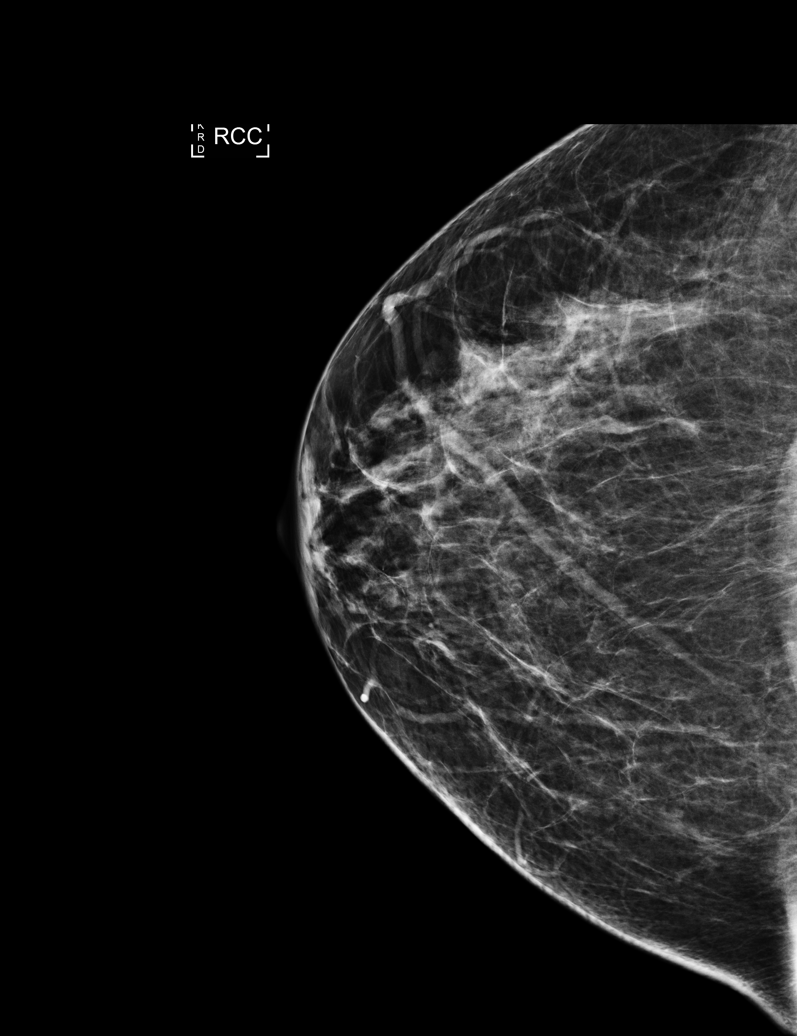

[R MLO]
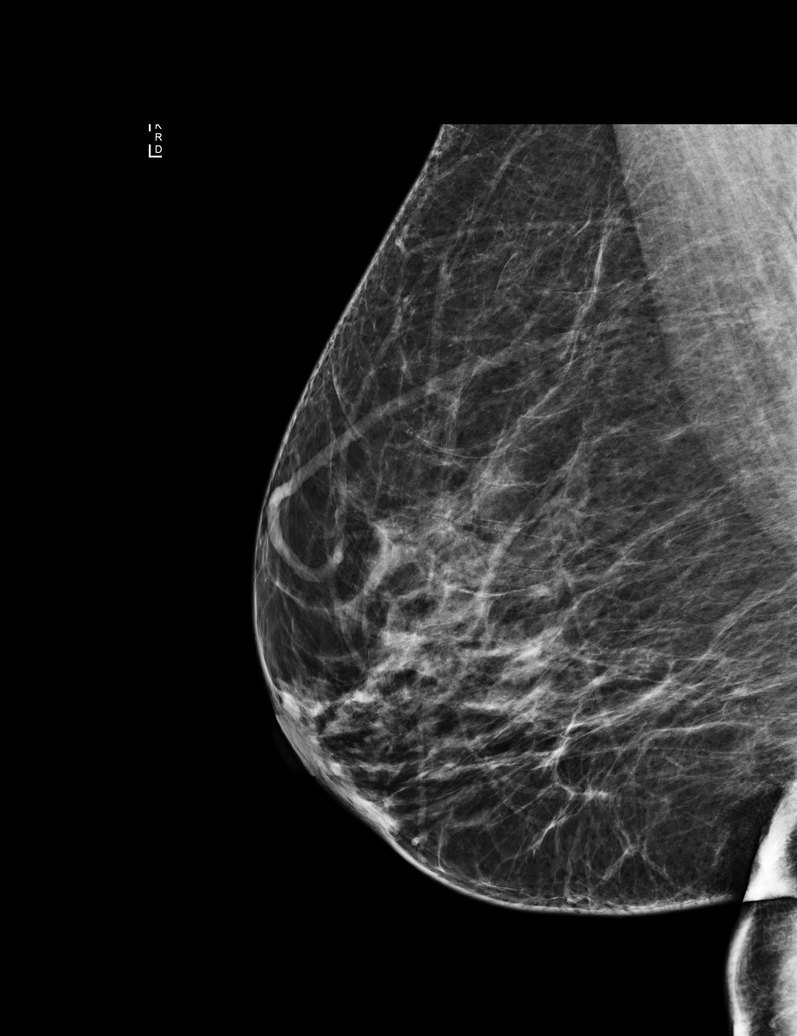

[R CC synth-2D]
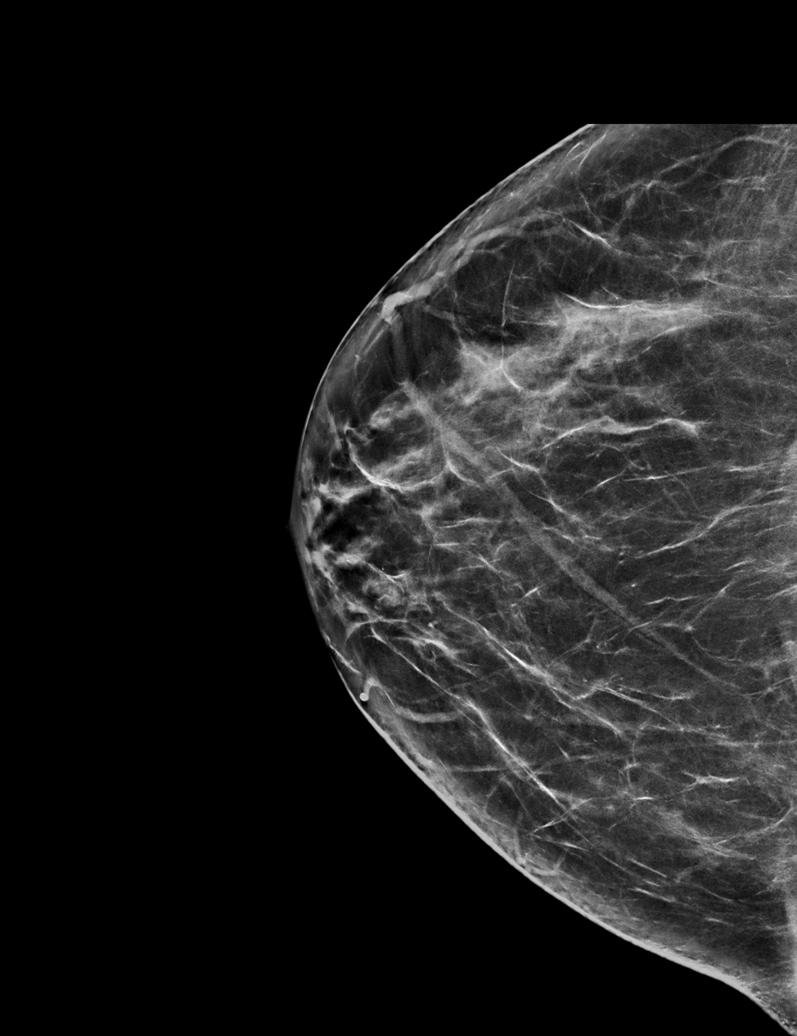

[R MLO synth-2D]
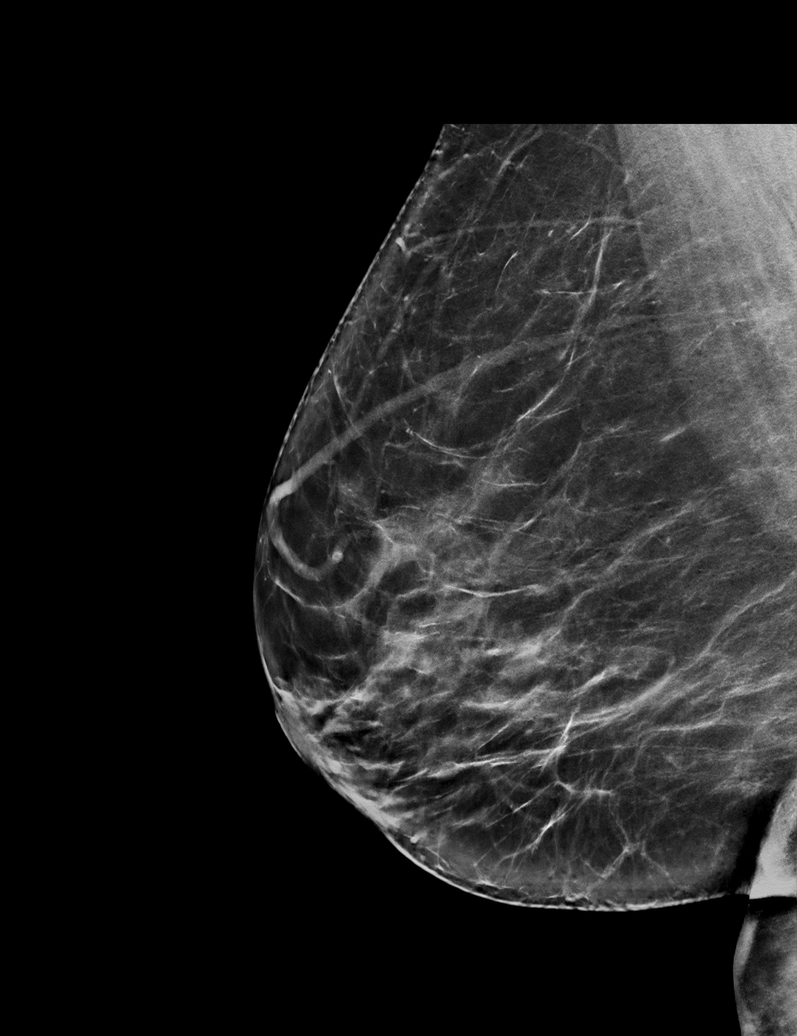

[R MLO tomo · tomo slice 41/81.0]
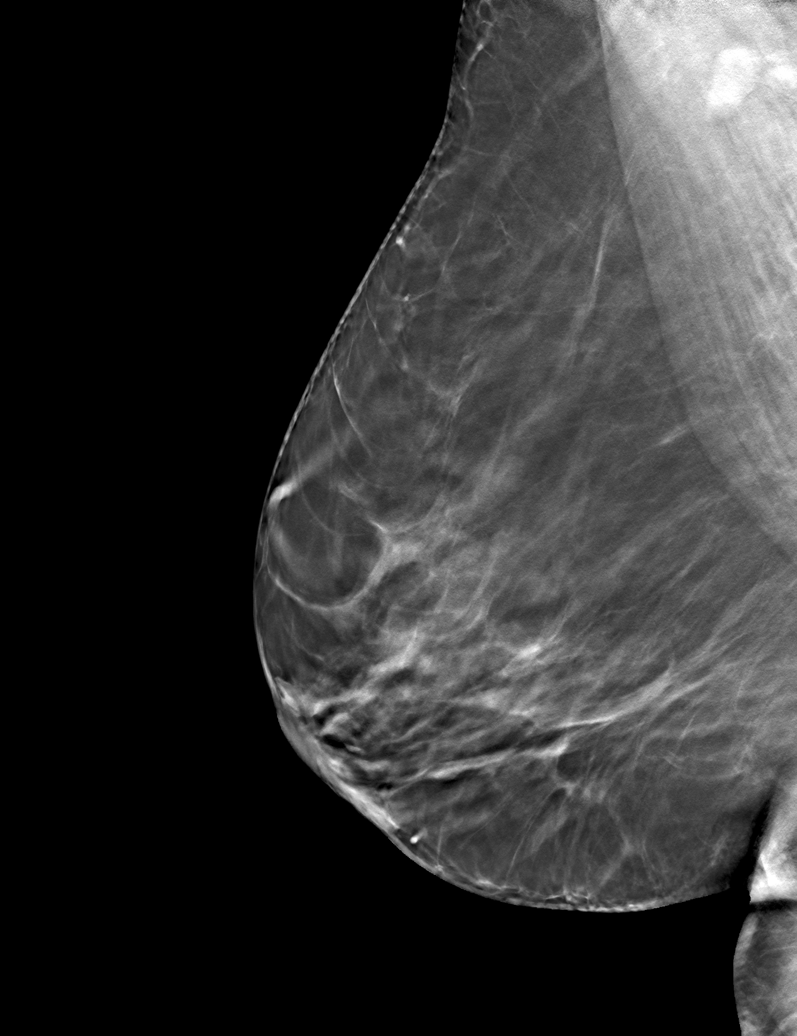

[R CC tomo · tomo slice 35/70.0]
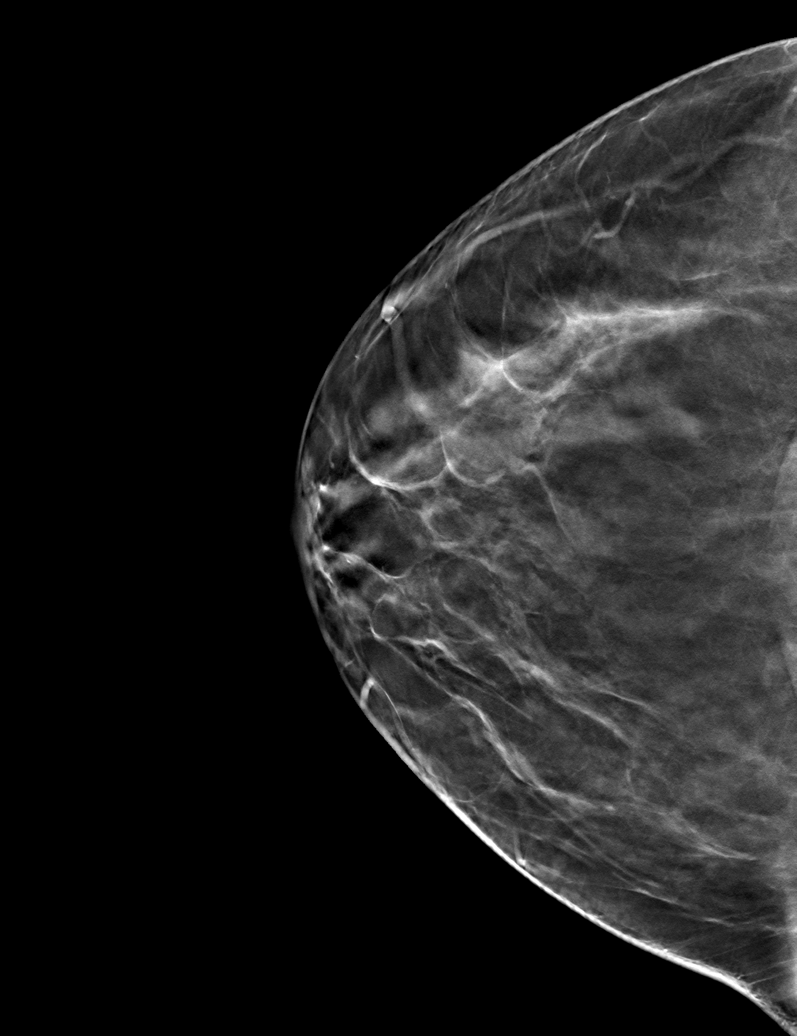

[6 of 14 positions shown; findings below may reference images not displayed]

ACR Breast Density Category b: There are scattered areas of
fibroglandular density.
FINDINGS: Routine 2D and 3D views of the right breast demonstrate no
persistent mass, distortion or worrisome calcifications. No findings
suspicious for malignancy are identified.

Mammographic images were processed with CAD.
IMPRESSION: No persistent abnormality in the area of the screening study
finding.

RECOMMENDATION:
Right screening mammogram in 1 year.

I have discussed the findings and recommendations with the patient.
Results were also provided in writing at the conclusion of the
visit. If applicable, a reminder letter will be sent to the patient
regarding the next appointment.

BI-RADS CATEGORY  1: Negative.

## 2015-05-28 ENCOUNTER — Telehealth: Payer: Self-pay | Admitting: Unknown Physician Specialty

## 2015-05-28 NOTE — Telephone Encounter (Signed)
Thanks.  We wrote an order yesterday

## 2015-05-28 NOTE — Telephone Encounter (Signed)
Pt called stated she needs a RX for a mastectomy bra. RX needs to be faxed to [phone] [facility]). Please call pt with any concerns or questions @ [phone]. Thanks.

## 2015-05-30 ENCOUNTER — Other Ambulatory Visit: Payer: Self-pay | Admitting: Unknown Physician Specialty

## 2015-05-30 DIAGNOSIS — E785 Hyperlipidemia, unspecified: Secondary | ICD-10-CM

## 2015-05-30 MED ORDER — ATORVASTATIN CALCIUM 10 MG PO TABS: 10.0000 mg | ORAL_TABLET | Freq: Every day | ORAL | Status: DC

## 2015-05-30 NOTE — Telephone Encounter (Signed)
Pt needs seen further refills.  Please make an appointment

## 2015-06-03 ENCOUNTER — Encounter (INDEPENDENT_AMBULATORY_CARE_PROVIDER_SITE_OTHER): Payer: Self-pay

## 2015-06-03 ENCOUNTER — Ambulatory Visit: Payer: Self-pay | Admitting: Unknown Physician Specialty

## 2015-06-03 ENCOUNTER — Encounter: Payer: Self-pay | Admitting: Unknown Physician Specialty

## 2015-06-03 ENCOUNTER — Ambulatory Visit (INDEPENDENT_AMBULATORY_CARE_PROVIDER_SITE_OTHER): Payer: BLUE CROSS/BLUE SHIELD | Admitting: Unknown Physician Specialty

## 2015-06-03 ENCOUNTER — Other Ambulatory Visit: Payer: Self-pay | Admitting: Unknown Physician Specialty

## 2015-06-03 VITALS — BP 135/84 | HR 69 | Temp 98.4°F | Ht <= 58 in | Wt 145.6 lb

## 2015-06-03 DIAGNOSIS — D649 Anemia, unspecified: Secondary | ICD-10-CM

## 2015-06-03 DIAGNOSIS — E0521 Thyrotoxicosis with toxic multinodular goiter with thyrotoxic crisis or storm: Secondary | ICD-10-CM

## 2015-06-03 DIAGNOSIS — I10 Essential (primary) hypertension: Secondary | ICD-10-CM

## 2015-06-03 DIAGNOSIS — E049 Nontoxic goiter, unspecified: Secondary | ICD-10-CM | POA: Insufficient documentation

## 2015-06-03 DIAGNOSIS — G4700 Insomnia, unspecified: Secondary | ICD-10-CM | POA: Insufficient documentation

## 2015-06-03 DIAGNOSIS — I159 Secondary hypertension, unspecified: Secondary | ICD-10-CM

## 2015-06-03 DIAGNOSIS — E785 Hyperlipidemia, unspecified: Secondary | ICD-10-CM

## 2015-06-03 DIAGNOSIS — Z853 Personal history of malignant neoplasm of breast: Secondary | ICD-10-CM | POA: Insufficient documentation

## 2015-06-03 DIAGNOSIS — K219 Gastro-esophageal reflux disease without esophagitis: Secondary | ICD-10-CM

## 2015-06-03 LAB — LIPID PANEL PICCOLO, WAIVED
Chol/HDL Ratio Piccolo,Waive: 3.1 mg/dL
Cholesterol Piccolo, Waived: 178 mg/dL (ref <200)
HDL CHOL PICCOLO, WAIVED: 58 mg/dL (ref >59)
LDL Chol Calc Piccolo Waived: 101 mg/dL — ABNORMAL HIGH (ref <100)
TRIGLYCERIDES PICCOLO,WAIVED: 93 mg/dL (ref <150)
VLDL Chol Calc Piccolo,Waive: 19 mg/dL (ref <30)

## 2015-06-03 NOTE — Progress Notes (Signed)
   BP 135/84 mmHg  Pulse 69  Temp(Src) 98.4 F (36.9 C)  Ht 4' 9.4" (1.458 m)  Wt 145 lb 9.6 oz (66.044 kg)  BMI 31.07 kg/m2  SpO2 99%   Subjective:    Patient ID: Kelly Poole, female    DOB: 20-Oct-1951, 64 y.o.   MRN: [id number]  HPI: Kelly Poole is a 64 y.o. female  Chief Complaint  Patient presents with  . Hyperlipidemia    Relevant past medical, surgical, family and social history reviewed and updated as indicated. Interim medical history since our last visit reviewed. Allergies and medications reviewed and updated.  Hyperlipidemia This is a chronic (Check today) problem. This is a new diagnosis. Pertinent negatives include no chest pain or shortness of breath. Current antihyperlipidemic treatment includes statins. Risk factors for coronary artery disease include hypertension and a sedentary lifestyle.  Hypertension This is a chronic problem. The problem is controlled. Pertinent negatives include no anxiety, blurred vision, chest pain, headaches, malaise/fatigue, neck pain, orthopnea, palpitations, peripheral edema, PND, shortness of breath or sweats. The current treatment provides significant improvement. There are no compliance problems.  Hypertensive end-organ damage includes kidney disease.     Review of Systems  Constitutional: Negative for malaise/fatigue.  Eyes: Negative for blurred vision.  Respiratory: Negative for shortness of breath.   Cardiovascular: Negative for chest pain, palpitations, orthopnea and PND.  Musculoskeletal: Negative for neck pain.  Neurological: Negative for headaches.    Per HPI unless specifically indicated above     Objective:    BP 135/84 mmHg  Pulse 69  Temp(Src) 98.4 F (36.9 C)  Ht 4' 9.4" (1.458 m)  Wt 145 lb 9.6 oz (66.044 kg)  BMI 31.07 kg/m2  SpO2 99%  Wt Readings from Last 3 Encounters:  06/03/15 145 lb 9.6 oz (66.044 kg)  03/11/15 142 lb (64.411 kg)    Physical Exam  Constitutional: She is oriented to  person, place, and time. She appears well-developed and well-nourished. No distress.  HENT:  Head: Normocephalic and atraumatic.  Eyes: Conjunctivae and lids are normal. Right eye exhibits no discharge. Left eye exhibits no discharge. No scleral icterus.  Cardiovascular: Normal rate and regular rhythm.   Pulmonary/Chest: Effort normal and breath sounds normal. No respiratory distress.  Abdominal: Normal appearance and bowel sounds are normal. She exhibits no distension. There is no splenomegaly or hepatomegaly. There is no tenderness.  Musculoskeletal: Normal range of motion.  Neurological: She is alert and oriented to person, place, and time.  Skin: Skin is dry and intact. No rash noted. No pallor.  Psychiatric: She has a normal mood and affect. Her behavior is normal. Judgment and thought content normal.      No results found for this or any previous visit.    Assessment & Plan:   Problem List Items Addressed This Visit      Cardiovascular and Mediastinum   High blood pressure - Primary    Check CMP to follow low GFR from previous        Other   Hyperlipidemia    Other Visit Diagnoses    Anemia, unspecified anemia type            Follow up plan: Return in about 6 months (around 12/03/2015).

## 2015-06-03 NOTE — Assessment & Plan Note (Signed)
Check CMP to follow low GFR from previous

## 2015-06-04 LAB — COMPREHENSIVE METABOLIC PANEL
ALBUMIN: 4.5 g/dL (ref 3.6–4.8)
ALT: 12 IU/L (ref 0–32)
AST: 15 IU/L (ref 0–40)
Albumin/Globulin Ratio: 1.6 (ref 1.1–2.5)
Alkaline Phosphatase: 89 IU/L (ref 39–117)
BUN/Creatinine Ratio: 20 (ref 11–26)
BUN: 21 mg/dL (ref 8–27)
Bilirubin Total: 0.2 mg/dL (ref 0.0–1.2)
CHLORIDE: 102 mmol/L (ref 97–108)
CO2: 22 mmol/L (ref 18–29)
CREATININE: 1.04 mg/dL — AB (ref 0.57–1.00)
Calcium: 9.7 mg/dL (ref 8.7–10.3)
GFR calc non Af Amer: 58 mL/min/{1.73_m2} — ABNORMAL LOW (ref >59)
GFR, EST AFRICAN AMERICAN: 67 mL/min/{1.73_m2} (ref >59)
GLOBULIN, TOTAL: 2.9 g/dL (ref 1.5–4.5)
Glucose: 99 mg/dL (ref 65–99)
Potassium: 4.2 mmol/L (ref 3.5–5.2)
Sodium: 142 mmol/L (ref 134–144)
TOTAL PROTEIN: 7.4 g/dL (ref 6.0–8.5)

## 2015-06-04 LAB — CBC
HEMATOCRIT: 33.6 % — AB (ref 34.0–46.6)
HEMOGLOBIN: 10.4 g/dL — AB (ref 11.1–15.9)
MCH: 29.1 pg (ref 26.6–33.0)
MCHC: 31 g/dL — AB (ref 31.5–35.7)
MCV: 94 fL (ref 79–97)
Platelets: 298 10*3/uL (ref 150–379)
RBC: 3.58 x10E6/uL — ABNORMAL LOW (ref 3.77–5.28)
RDW: 13.7 % (ref 12.3–15.4)
WBC: 6.3 10*3/uL (ref 3.4–10.8)

## 2015-06-28 ENCOUNTER — Telehealth: Payer: Self-pay | Admitting: Unknown Physician Specialty

## 2015-06-28 NOTE — Telephone Encounter (Signed)
Routing to provider  

## 2015-06-28 NOTE — Telephone Encounter (Signed)
I called, reached voicemail; left message that I'll try her tomorrow

## 2015-06-28 NOTE — Telephone Encounter (Signed)
Pt called in and said that her blood pressure medication was costing $50 more and she cant afford if and she would like to know if you could call in something cheaper to [facility] on [address]

## 2015-06-29 NOTE — Telephone Encounter (Signed)
I called, reached voicemail; left message, will try Monday

## 2015-07-01 MED ORDER — LISINOPRIL-HYDROCHLOROTHIAZIDE 10-12.5 MG PO TABS: 1.0000 | ORAL_TABLET | Freq: Every day | ORAL | Status: DC

## 2015-07-01 NOTE — Telephone Encounter (Signed)
Spoke to patient and let her know that Dr. Sanda Klein sent in a new medication that will hopefully be cheaper for her. Patient also scheduled a nurse visit on 07/15/15 for a BP check to see how she is doing on the new medication.

## 2015-07-01 NOTE — Telephone Encounter (Addendum)
Please call patient I have tried to reach her Let her know I am switching her BP medicine to something that should be reasonably priced Ask her to come by for a CMA visit to recheck her BP 2-3 weeks after starting the new medicine (to [facility] for review)

## 2015-07-15 ENCOUNTER — Ambulatory Visit: Payer: 59

## 2015-07-15 VITALS — BP 113/75

## 2015-07-15 DIAGNOSIS — Z0130 Encounter for examination of blood pressure without abnormal findings: Secondary | ICD-10-CM

## 2015-07-28 ENCOUNTER — Other Ambulatory Visit: Payer: Self-pay | Admitting: Unknown Physician Specialty

## 2015-07-28 ENCOUNTER — Other Ambulatory Visit: Payer: Self-pay | Admitting: Family Medicine

## 2015-07-29 NOTE — Telephone Encounter (Signed)
Forwarding to her primary provider

## 2015-07-29 NOTE — Telephone Encounter (Signed)
Routing to provider  

## 2015-09-03 ENCOUNTER — Other Ambulatory Visit: Payer: Self-pay | Admitting: Unknown Physician Specialty

## 2015-09-03 MED ORDER — ATORVASTATIN CALCIUM 10 MG PO TABS: 10.0000 mg | ORAL_TABLET | Freq: Every day | ORAL | Status: DC

## 2015-09-03 NOTE — Telephone Encounter (Signed)
Last sgpt and creatinine and K+ checked; Rxs approved

## 2015-10-06 ENCOUNTER — Other Ambulatory Visit: Payer: Self-pay | Admitting: Family Medicine

## 2015-10-07 NOTE — Telephone Encounter (Signed)
Forwarding to primary 

## 2015-11-05 ENCOUNTER — Other Ambulatory Visit: Payer: Self-pay | Admitting: Unknown Physician Specialty

## 2015-11-11 ENCOUNTER — Other Ambulatory Visit: Payer: Self-pay | Admitting: Unknown Physician Specialty

## 2015-12-03 ENCOUNTER — Ambulatory Visit: Payer: BLUE CROSS/BLUE SHIELD | Admitting: Unknown Physician Specialty

## 2015-12-04 ENCOUNTER — Ambulatory Visit: Payer: 59 | Admitting: Unknown Physician Specialty

## 2015-12-07 ENCOUNTER — Other Ambulatory Visit: Payer: Self-pay | Admitting: Unknown Physician Specialty

## 2015-12-09 ENCOUNTER — Other Ambulatory Visit: Payer: Self-pay | Admitting: Unknown Physician Specialty

## 2015-12-19 ENCOUNTER — Ambulatory Visit: Payer: 59 | Admitting: Unknown Physician Specialty

## 2015-12-27 ENCOUNTER — Encounter: Payer: Self-pay | Admitting: Unknown Physician Specialty

## 2015-12-27 ENCOUNTER — Ambulatory Visit (INDEPENDENT_AMBULATORY_CARE_PROVIDER_SITE_OTHER): Payer: 59 | Admitting: Unknown Physician Specialty

## 2015-12-27 VITALS — BP 116/75 | HR 83 | Temp 98.3°F | Ht <= 58 in | Wt 149.4 lb

## 2015-12-27 DIAGNOSIS — D649 Anemia, unspecified: Secondary | ICD-10-CM

## 2015-12-27 DIAGNOSIS — I10 Essential (primary) hypertension: Secondary | ICD-10-CM | POA: Diagnosis not present

## 2015-12-27 DIAGNOSIS — E785 Hyperlipidemia, unspecified: Secondary | ICD-10-CM | POA: Diagnosis not present

## 2015-12-27 DIAGNOSIS — D518 Other vitamin B12 deficiency anemias: Secondary | ICD-10-CM | POA: Insufficient documentation

## 2015-12-27 NOTE — Progress Notes (Signed)
BP 116/75 mmHg  Pulse 83  Temp(Src) 98.3 F (36.8 C)  Ht 4' 8.5" (1.435 m)  Wt 149 lb 6.4 oz (67.767 kg)  BMI 32.91 kg/m2  SpO2 97%   Subjective:    Patient ID: Kelly Poole, female    DOB: 02/02/1951, 65 y.o.   MRN: [id number]  HPI: Kelly Poole is a 65 y.o. female  Chief Complaint  Patient presents with  . Hyperlipidemia  . Hypertension   Hypertension Using medications without difficulty Average home BPs "sometime" about the same as here.    No problems or lightheadedness No chest pain with exertion or shortness of breath No Edema   Hyperlipidemia Using medications without problems: No Muscle aches  Diet compliance: good Exercise "a little bit" but active at work  Anemia H/H was low last visit.  This is typical for her.  We need to recheck  Relevant past medical, surgical, family and social history reviewed and updated as indicated. Interim medical history since our last visit reviewed. Allergies and medications reviewed and updated.  Review of Systems  Per HPI unless specifically indicated above     Objective:    BP 116/75 mmHg  Pulse 83  Temp(Src) 98.3 F (36.8 C)  Ht 4' 8.5" (1.435 m)  Wt 149 lb 6.4 oz (67.767 kg)  BMI 32.91 kg/m2  SpO2 97%  Wt Readings from Last 3 Encounters:  12/27/15 149 lb 6.4 oz (67.767 kg)  06/03/15 145 lb 9.6 oz (66.044 kg)  03/11/15 142 lb (64.411 kg)    Physical Exam  Constitutional: She is oriented to person, place, and time. She appears well-developed and well-nourished. No distress.  HENT:  Head: Normocephalic and atraumatic.  Eyes: Conjunctivae and lids are normal. Right eye exhibits no discharge. Left eye exhibits no discharge. No scleral icterus.  Neck: Normal range of motion. Neck supple. No JVD present. Carotid bruit is not present.  Cardiovascular: Normal rate, regular rhythm and normal heart sounds.   Pulmonary/Chest: Effort normal and breath sounds normal.  Abdominal: Normal appearance. There is  no splenomegaly or hepatomegaly.  Musculoskeletal: Normal range of motion.  Neurological: She is alert and oriented to person, place, and time.  Skin: Skin is warm, dry and intact. No rash noted. No pallor.  Psychiatric: She has a normal mood and affect. Her behavior is normal. Judgment and thought content normal.    Results for orders placed or performed in visit on 06/03/15  CBC  Result Value Ref Range   WBC 6.3 3.4 - 10.8 x10E3/uL   RBC 3.58 (L) 3.77 - 5.28 x10E6/uL   Hemoglobin 10.4 (L) 11.1 - 15.9 g/dL   Hematocrit 33.6 (L) 34.0 - 46.6 %   MCV 94 79 - 97 fL   MCH 29.1 26.6 - 33.0 pg   MCHC 31.0 (L) 31.5 - 35.7 g/dL   RDW 13.7 12.3 - 15.4 %   Platelets 298 150 - 379 x10E3/uL  Comp Met (CMET)  Result Value Ref Range   Glucose 99 65 - 99 mg/dL   BUN 21 8 - 27 mg/dL   Creatinine, Ser 1.04 (H) 0.57 - 1.00 mg/dL   GFR calc non Af Amer 58 (L) >59 mL/min/1.73   GFR calc Af Amer 67 >59 mL/min/1.73   BUN/Creatinine Ratio 20 11 - 26   Sodium 142 134 - 144 mmol/L   Potassium 4.2 3.5 - 5.2 mmol/L   Chloride 102 97 - 108 mmol/L   CO2 22 18 - 29 mmol/L  Calcium 9.7 8.7 - 10.3 mg/dL   Total Protein 7.4 6.0 - 8.5 g/dL   Albumin 4.5 3.6 - 4.8 g/dL   Globulin, Total 2.9 1.5 - 4.5 g/dL   Albumin/Globulin Ratio 1.6 1.1 - 2.5   Bilirubin Total 0.2 0.0 - 1.2 mg/dL   Alkaline Phosphatase 89 39 - 117 IU/L   AST 15 0 - 40 IU/L   ALT 12 0 - 32 IU/L  Lipid Panel Piccolo, Waived  Result Value Ref Range   Cholesterol Piccolo, Waived 178 <200 mg/dL   HDL Chol Piccolo, Waived 58 >59 mg/dL   Triglycerides Piccolo,Waived 93 <150 mg/dL   Chol/HDL Ratio Piccolo,Waive 3.1 mg/dL   LDL Chol Calc Piccolo Waived 101 (H) <100 mg/dL   VLDL Chol Calc Piccolo,Waive 19 <30 mg/dL      Assessment & Plan:   Problem List Items Addressed This Visit      Unprioritized   Hyperlipidemia    Check lipid panel      Relevant Orders   Lipid Panel w/o Chol/HDL Ratio   Anemia    Check CBC      Relevant  Medications   vitamin B-12 (CYANOCOBALAMIN) 1000 MCG tablet   Other Relevant Orders   CBC with Differential/Platelet   Hypertension - Primary    Stable, continue present medications.        Relevant Orders   Comprehensive metabolic panel       Follow up plan: Return in about 6 months (around 06/25/2016).

## 2015-12-27 NOTE — Assessment & Plan Note (Signed)
Check CBC 

## 2015-12-27 NOTE — Assessment & Plan Note (Signed)
Stable, continue present medications.   

## 2015-12-27 NOTE — Assessment & Plan Note (Signed)
Check lipid panel  

## 2015-12-28 LAB — LIPID PANEL W/O CHOL/HDL RATIO
CHOLESTEROL TOTAL: 198 mg/dL (ref 100–199)
HDL: 58 mg/dL (ref >39)
LDL Calculated: 122 mg/dL — ABNORMAL HIGH (ref 0–99)
TRIGLYCERIDES: 89 mg/dL (ref 0–149)
VLDL Cholesterol Cal: 18 mg/dL (ref 5–40)

## 2015-12-28 LAB — CBC WITH DIFFERENTIAL/PLATELET
BASOS ABS: 0 10*3/uL (ref 0.0–0.2)
Basos: 1 %
EOS (ABSOLUTE): 0.1 10*3/uL (ref 0.0–0.4)
Eos: 2 %
Hematocrit: 33.2 % — ABNORMAL LOW (ref 34.0–46.6)
Hemoglobin: 10.6 g/dL — ABNORMAL LOW (ref 11.1–15.9)
IMMATURE GRANS (ABS): 0 10*3/uL (ref 0.0–0.1)
IMMATURE GRANULOCYTES: 0 %
LYMPHS: 36 %
Lymphocytes Absolute: 2.3 10*3/uL (ref 0.7–3.1)
MCH: 29.5 pg (ref 26.6–33.0)
MCHC: 31.9 g/dL (ref 31.5–35.7)
MCV: 93 fL (ref 79–97)
MONOS ABS: 0.4 10*3/uL (ref 0.1–0.9)
Monocytes: 7 %
NEUTROS PCT: 54 %
Neutrophils Absolute: 3.5 10*3/uL (ref 1.4–7.0)
PLATELETS: 299 10*3/uL (ref 150–379)
RBC: 3.59 x10E6/uL — AB (ref 3.77–5.28)
RDW: 13.8 % (ref 12.3–15.4)
WBC: 6.4 10*3/uL (ref 3.4–10.8)

## 2015-12-28 LAB — COMPREHENSIVE METABOLIC PANEL
A/G RATIO: 1.5 (ref 1.1–2.5)
ALK PHOS: 96 IU/L (ref 39–117)
ALT: 16 IU/L (ref 0–32)
AST: 14 IU/L (ref 0–40)
Albumin: 4.6 g/dL (ref 3.6–4.8)
BUN/Creatinine Ratio: 17 (ref 11–26)
BUN: 22 mg/dL (ref 8–27)
Bilirubin Total: 0.2 mg/dL (ref 0.0–1.2)
CALCIUM: 9.7 mg/dL (ref 8.7–10.3)
CHLORIDE: 103 mmol/L (ref 96–106)
CO2: 19 mmol/L (ref 18–29)
Creatinine, Ser: 1.31 mg/dL — ABNORMAL HIGH (ref 0.57–1.00)
GFR calc Af Amer: 50 mL/min/{1.73_m2} — ABNORMAL LOW (ref >59)
GFR calc non Af Amer: 44 mL/min/{1.73_m2} — ABNORMAL LOW (ref >59)
GLOBULIN, TOTAL: 3 g/dL (ref 1.5–4.5)
Glucose: 112 mg/dL — ABNORMAL HIGH (ref 65–99)
POTASSIUM: 4.5 mmol/L (ref 3.5–5.2)
SODIUM: 141 mmol/L (ref 134–144)
Total Protein: 7.6 g/dL (ref 6.0–8.5)

## 2015-12-31 ENCOUNTER — Other Ambulatory Visit: Payer: Self-pay | Admitting: Unknown Physician Specialty

## 2015-12-31 ENCOUNTER — Telehealth: Payer: Self-pay | Admitting: Unknown Physician Specialty

## 2015-12-31 DIAGNOSIS — N183 Chronic kidney disease, stage 3 unspecified: Secondary | ICD-10-CM

## 2015-12-31 NOTE — Progress Notes (Signed)
Discussed with pt.  GFR 44 with normocytic anemia.  Refer to Nephrology

## 2015-12-31 NOTE — Telephone Encounter (Signed)
Routing to Cheryl

## 2015-12-31 NOTE — Telephone Encounter (Signed)
Pt called stated she is calling Malachy Mood back regarding her labs. Patient missed the first call. Please call ASAP. Thanks

## 2016-01-02 ENCOUNTER — Telehealth: Payer: Self-pay | Admitting: Unknown Physician Specialty

## 2016-01-02 NOTE — Telephone Encounter (Signed)
Pt would like to speak to CW about her lisinopril.

## 2016-01-02 NOTE — Telephone Encounter (Signed)
Forward to provider

## 2016-01-03 MED ORDER — LISINOPRIL-HYDROCHLOROTHIAZIDE 10-12.5 MG PO TABS: 1.0000 | ORAL_TABLET | Freq: Every day | ORAL | Status: DC

## 2016-01-03 MED ORDER — ATORVASTATIN CALCIUM 10 MG PO TABS: 10.0000 mg | ORAL_TABLET | Freq: Every day | ORAL | Status: DC

## 2016-01-03 MED ORDER — OMEPRAZOLE MAGNESIUM 20 MG PO TBEC: 20.0000 mg | DELAYED_RELEASE_TABLET | Freq: Every day | ORAL | Status: DC

## 2016-01-03 NOTE — Telephone Encounter (Signed)
Discussed Lisinopril with patient and refills given.

## 2016-03-04 ENCOUNTER — Other Ambulatory Visit: Payer: Self-pay | Admitting: Unknown Physician Specialty

## 2016-04-02 ENCOUNTER — Other Ambulatory Visit: Payer: Self-pay | Admitting: Unknown Physician Specialty

## 2016-04-30 ENCOUNTER — Other Ambulatory Visit: Payer: Self-pay | Admitting: Unknown Physician Specialty

## 2016-04-30 DIAGNOSIS — Z1231 Encounter for screening mammogram for malignant neoplasm of breast: Secondary | ICD-10-CM

## 2016-05-06 ENCOUNTER — Other Ambulatory Visit: Payer: Self-pay | Admitting: Unknown Physician Specialty

## 2016-05-08 ENCOUNTER — Ambulatory Visit
Admission: RE | Admit: 2016-05-08 | Discharge: 2016-05-08 | Disposition: A | Discharge status: Home / Self Care | Payer: 59 | Source: Ambulatory Visit | Attending: Unknown Physician Specialty | Admitting: Unknown Physician Specialty

## 2016-05-08 DIAGNOSIS — Z1231 Encounter for screening mammogram for malignant neoplasm of breast: Secondary | ICD-10-CM | POA: Diagnosis present

## 2016-05-08 IMAGING — MG MM DIGITAL SCREENING UNILAT*R* W/ CAD
3 series · 3 of 3 positions shown · non-contrast
Comparison: Previous exam(s).

CLINICAL DATA: Screening.

EXAM:
DIGITAL SCREENING UNILATERAL RIGHT MAMMOGRAM WITH CAD

[R CC]
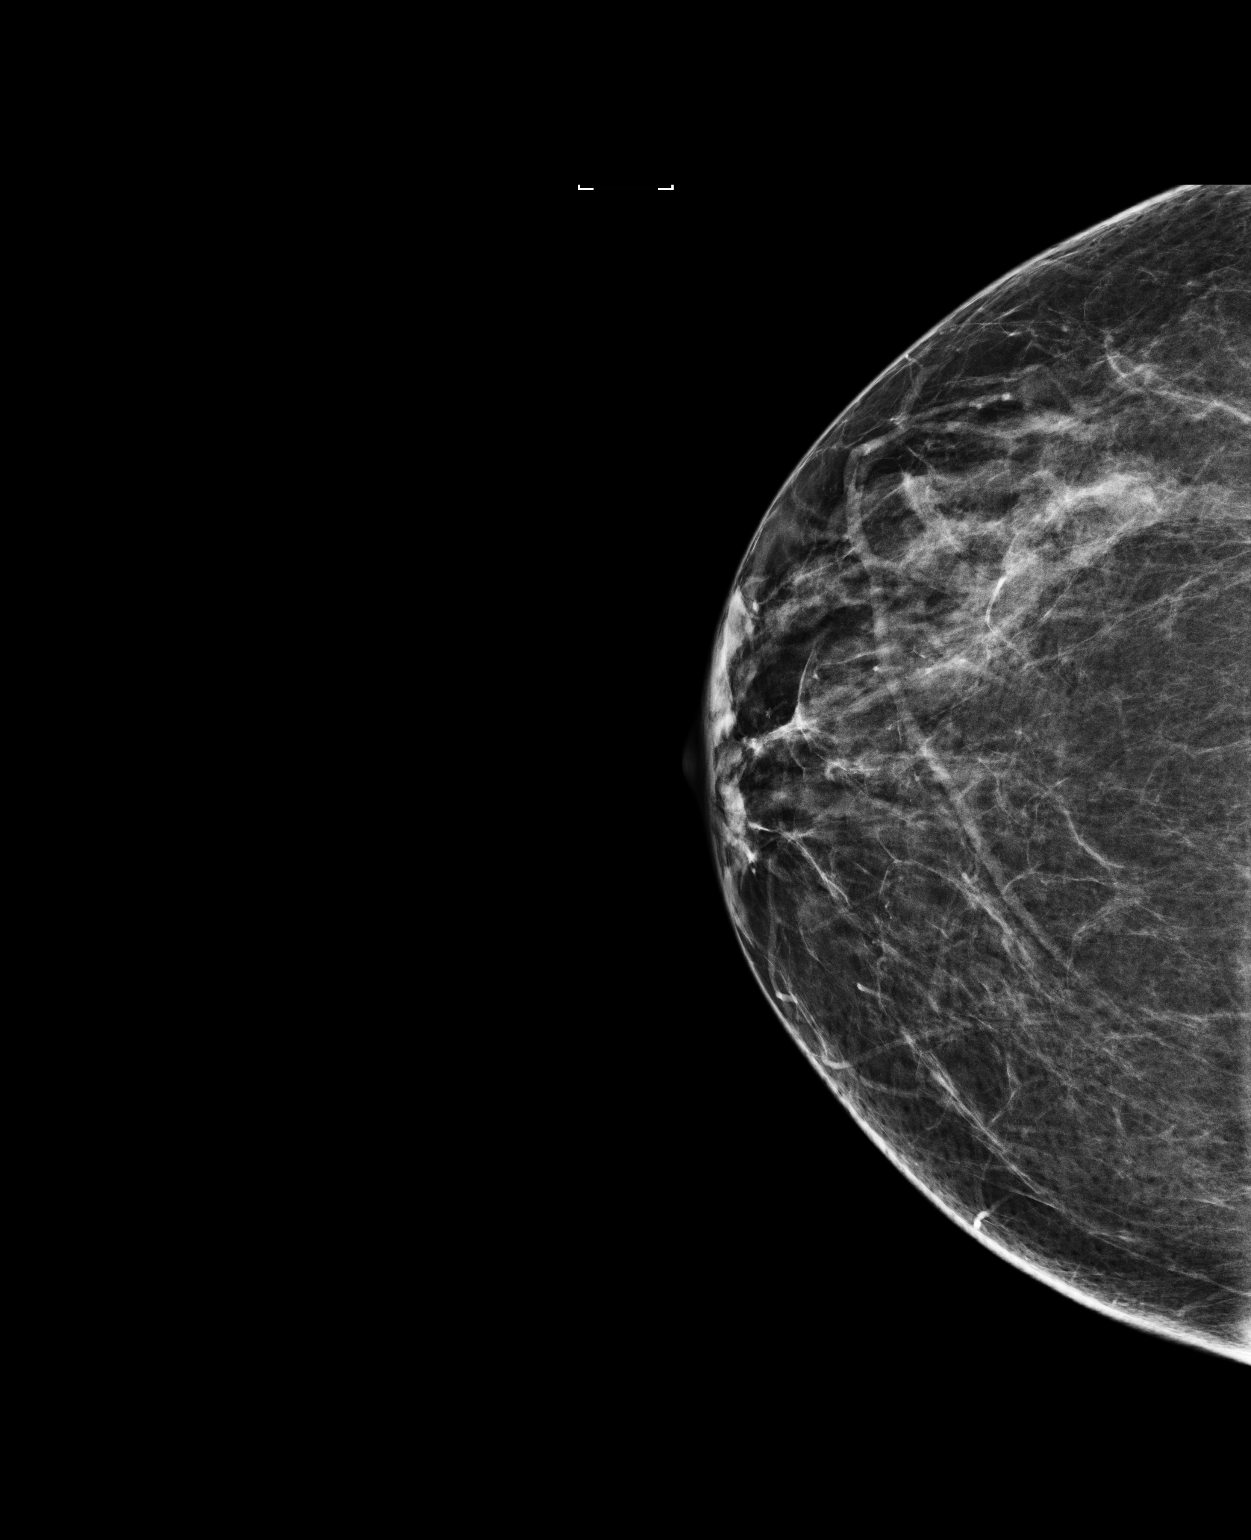

[R MLO]
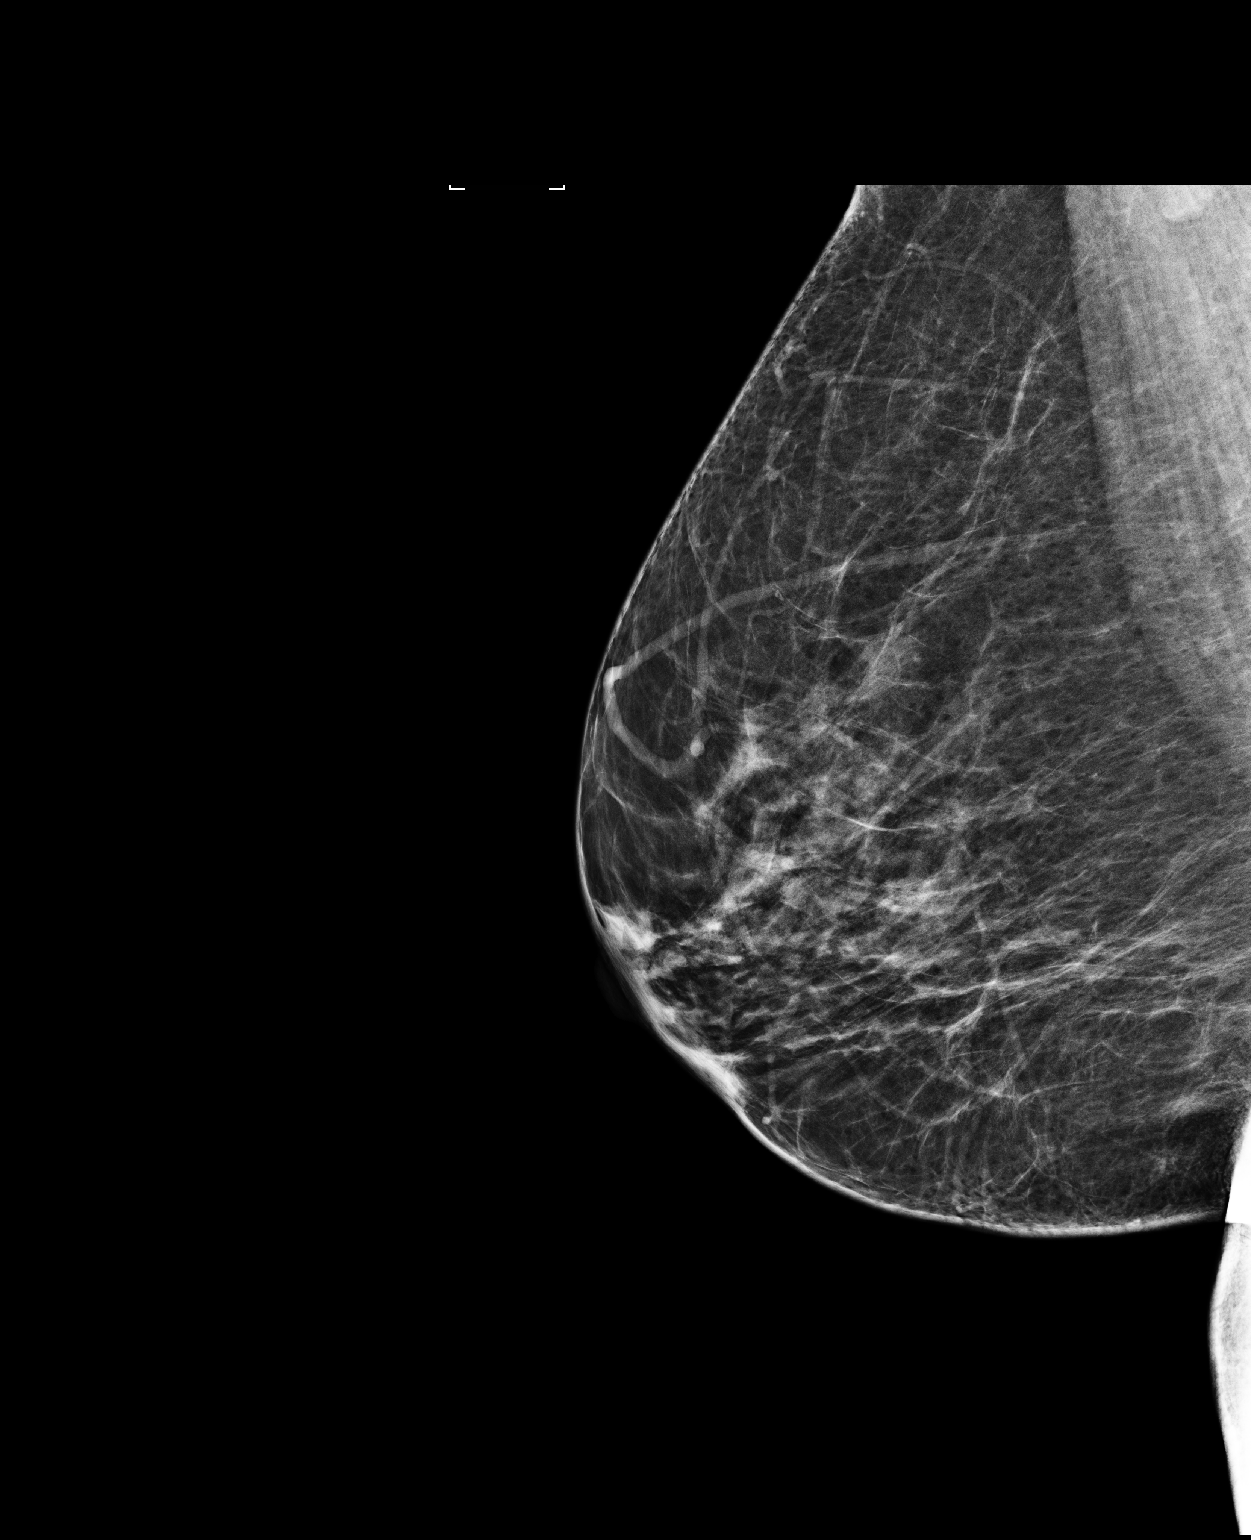

[R XCCL]
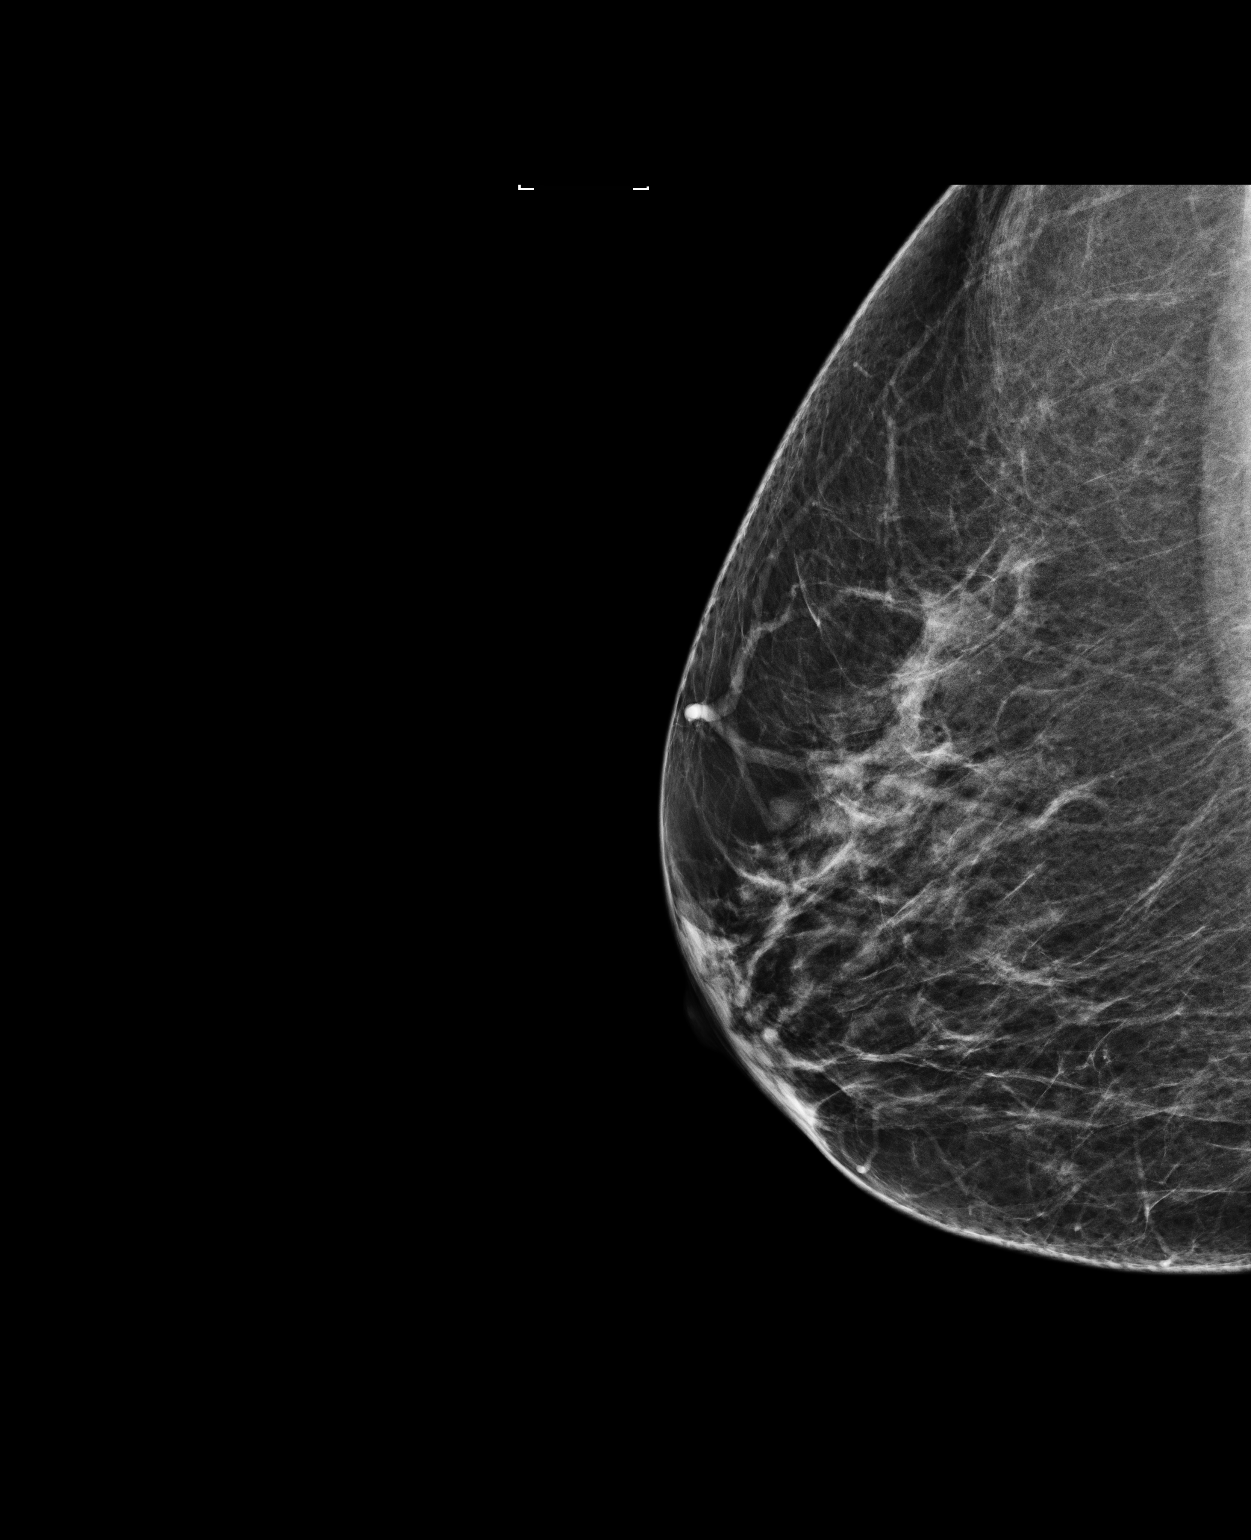

[3 of 3 positions shown; findings below may reference images not displayed]

ACR Breast Density Category b: There are scattered areas of
fibroglandular density.
FINDINGS: There are no findings suspicious for malignancy. Images were
processed with CAD.
IMPRESSION: No mammographic evidence of malignancy. A result letter of this
screening mammogram will be mailed directly to the patient.

RECOMMENDATION:
Screening mammogram in one year. (Code:[N8])

BI-RADS CATEGORY  1: Negative.

## 2016-06-05 ENCOUNTER — Other Ambulatory Visit: Payer: Self-pay | Admitting: Unknown Physician Specialty

## 2016-06-05 NOTE — Telephone Encounter (Signed)
Needs seen further refills 

## 2016-06-05 NOTE — Telephone Encounter (Signed)
Patient has f/u already scheduled for 06/29/16.

## 2016-06-11 ENCOUNTER — Other Ambulatory Visit: Payer: Self-pay | Admitting: Unknown Physician Specialty

## 2016-06-29 ENCOUNTER — Encounter: Payer: 59 | Admitting: Unknown Physician Specialty

## 2016-07-05 ENCOUNTER — Other Ambulatory Visit: Payer: Self-pay | Admitting: Unknown Physician Specialty

## 2016-07-10 ENCOUNTER — Other Ambulatory Visit: Payer: Self-pay | Admitting: Unknown Physician Specialty

## 2016-08-04 ENCOUNTER — Other Ambulatory Visit: Payer: Self-pay | Admitting: Unknown Physician Specialty

## 2016-09-04 ENCOUNTER — Other Ambulatory Visit: Payer: Self-pay | Admitting: Unknown Physician Specialty

## 2016-09-04 MED ORDER — LISINOPRIL-HYDROCHLOROTHIAZIDE 10-12.5 MG PO TABS: 1.0000 | ORAL_TABLET | Freq: Every day | ORAL | 1 refills | Status: DC

## 2016-09-04 MED ORDER — OMEPRAZOLE 20 MG PO CPDR
20.0000 mg | DELAYED_RELEASE_CAPSULE | Freq: Every day | ORAL | 1 refills | Status: DC
Start: 2016-09-04 — End: 2016-10-05

## 2016-09-04 MED ORDER — ATORVASTATIN CALCIUM 10 MG PO TABS: 10.0000 mg | ORAL_TABLET | Freq: Every day | ORAL | 1 refills | Status: DC

## 2016-09-04 NOTE — Telephone Encounter (Signed)
Routing to provider. Please send enough medication to get to appointment,

## 2016-09-04 NOTE — Telephone Encounter (Signed)
Pt called and made appt for 09/22/16.  She would like to get enough medications to get here through to that appt.  The medication had been denied due to not being seen since January.

## 2016-09-22 ENCOUNTER — Ambulatory Visit: Payer: 59 | Admitting: Unknown Physician Specialty

## 2016-10-05 ENCOUNTER — Encounter: Payer: Self-pay | Admitting: Unknown Physician Specialty

## 2016-10-05 ENCOUNTER — Ambulatory Visit: Payer: 59 | Admitting: Unknown Physician Specialty

## 2016-10-05 ENCOUNTER — Ambulatory Visit (INDEPENDENT_AMBULATORY_CARE_PROVIDER_SITE_OTHER): Payer: 59 | Admitting: Unknown Physician Specialty

## 2016-10-05 VITALS — BP 113/72 | HR 72 | Temp 97.9°F | Ht <= 58 in | Wt 148.0 lb

## 2016-10-05 DIAGNOSIS — N183 Chronic kidney disease, stage 3 unspecified: Secondary | ICD-10-CM | POA: Insufficient documentation

## 2016-10-05 DIAGNOSIS — I10 Essential (primary) hypertension: Secondary | ICD-10-CM

## 2016-10-05 DIAGNOSIS — E7800 Pure hypercholesterolemia, unspecified: Secondary | ICD-10-CM

## 2016-10-05 DIAGNOSIS — I129 Hypertensive chronic kidney disease with stage 1 through stage 4 chronic kidney disease, or unspecified chronic kidney disease: Secondary | ICD-10-CM

## 2016-10-05 DIAGNOSIS — N182 Chronic kidney disease, stage 2 (mild): Secondary | ICD-10-CM

## 2016-10-05 MED ORDER — OMEPRAZOLE 20 MG PO CPDR: 20.0000 mg | DELAYED_RELEASE_CAPSULE | Freq: Every day | ORAL | 12 refills | Status: DC

## 2016-10-05 MED ORDER — ATORVASTATIN CALCIUM 10 MG PO TABS: 10.0000 mg | ORAL_TABLET | Freq: Every day | ORAL | 6 refills | Status: DC

## 2016-10-05 MED ORDER — LISINOPRIL-HYDROCHLOROTHIAZIDE 10-12.5 MG PO TABS: 1.0000 | ORAL_TABLET | Freq: Every day | ORAL | 6 refills | Status: DC

## 2016-10-05 NOTE — Assessment & Plan Note (Signed)
Stable, continue present medications.   

## 2016-10-05 NOTE — Progress Notes (Addendum)
BP 113/72 (BP Location: Left Arm, Cuff Size: Large)   Pulse 72   Temp 97.9 F (36.6 C)   Ht 4' 9.5" (1.461 m) Comment: pt had shoes on  Wt 148 lb (67.1 kg) Comment: pt had shoes on  SpO2 100%   BMI 31.47 kg/m    Subjective:    Patient ID: Kelly Poole, female    DOB: 05-22-1951, 65 y.o.   MRN: [id number]  HPI: Kelly Poole is a 65 y.o. female  Chief Complaint  Patient presents with  . Hyperlipidemia  . Hypertension  . Gastroesophageal Reflux   Hypertension Using medications without difficulty Average home BPs "sometime" about the same as here.    No problems or lightheadedness No chest pain with exertion or shortness of breath No Edema   Hyperlipidemia Using medications without problems: No Muscle aches  Diet compliance: good Exercise "a little bit" but active at work  Anemia H/H was low last visit.  This is typical for her.  We need to recheck  Relevant past medical, surgical, family and social history reviewed and updated as indicated. Interim medical history since our last visit reviewed. Allergies and medications reviewed and updated.  Review of Systems  Per HPI unless specifically indicated above     Objective:    BP 113/72 (BP Location: Left Arm, Cuff Size: Large)   Pulse 72   Temp 97.9 F (36.6 C)   Ht 4' 9.5" (1.461 m) Comment: pt had shoes on  Wt 148 lb (67.1 kg) Comment: pt had shoes on  SpO2 100%   BMI 31.47 kg/m   Wt Readings from Last 3 Encounters:  10/05/16 148 lb (67.1 kg)  12/27/15 149 lb 6.4 oz (67.8 kg)  06/03/15 145 lb 9.6 oz (66 kg)    Physical Exam  Constitutional: She is oriented to person, place, and time. She appears well-developed and well-nourished. No distress.  HENT:  Head: Normocephalic and atraumatic.  Eyes: Conjunctivae and lids are normal. Right eye exhibits no discharge. Left eye exhibits no discharge. No scleral icterus.  Neck: Normal range of motion. Neck supple. No JVD present. Carotid bruit is not  present.  Cardiovascular: Normal rate, regular rhythm and normal heart sounds.   Pulmonary/Chest: Effort normal and breath sounds normal.  Abdominal: Normal appearance. There is no splenomegaly or hepatomegaly.  Musculoskeletal: Normal range of motion.  Neurological: She is alert and oriented to person, place, and time.  Skin: Skin is warm, dry and intact. No rash noted. No pallor.  Psychiatric: She has a normal mood and affect. Her behavior is normal. Judgment and thought content normal.    Results for orders placed or performed in visit on 12/27/15  Lipid Panel w/o Chol/HDL Ratio  Result Value Ref Range   Cholesterol, Total 198 100 - 199 mg/dL   Triglycerides 89 0 - 149 mg/dL   HDL 58 >39 mg/dL   VLDL Cholesterol Cal 18 5 - 40 mg/dL   LDL Calculated 122 (H) 0 - 99 mg/dL  Comprehensive metabolic panel  Result Value Ref Range   Glucose 112 (H) 65 - 99 mg/dL   BUN 22 8 - 27 mg/dL   Creatinine, Ser 1.31 (H) 0.57 - 1.00 mg/dL   GFR calc non Af Amer 44 (L) >59 mL/min/1.73   GFR calc Af Amer 50 (L) >59 mL/min/1.73   BUN/Creatinine Ratio 17 11 - 26   Sodium 141 134 - 144 mmol/L   Potassium 4.5 3.5 - 5.2 mmol/L   Chloride  103 96 - 106 mmol/L   CO2 19 18 - 29 mmol/L   Calcium 9.7 8.7 - 10.3 mg/dL   Total Protein 7.6 6.0 - 8.5 g/dL   Albumin 4.6 3.6 - 4.8 g/dL   Globulin, Total 3.0 1.5 - 4.5 g/dL   Albumin/Globulin Ratio 1.5 1.1 - 2.5   Bilirubin Total 0.2 0.0 - 1.2 mg/dL   Alkaline Phosphatase 96 39 - 117 IU/L   AST 14 0 - 40 IU/L   ALT 16 0 - 32 IU/L  CBC with Differential/Platelet  Result Value Ref Range   WBC 6.4 3.4 - 10.8 x10E3/uL   RBC 3.59 (L) 3.77 - 5.28 x10E6/uL   Hemoglobin 10.6 (L) 11.1 - 15.9 g/dL   Hematocrit 33.2 (L) 34.0 - 46.6 %   MCV 93 79 - 97 fL   MCH 29.5 26.6 - 33.0 pg   MCHC 31.9 31.5 - 35.7 g/dL   RDW 13.8 12.3 - 15.4 %   Platelets 299 150 - 379 x10E3/uL   Neutrophils 54 %   Lymphs 36 %   Monocytes 7 %   Eos 2 %   Basos 1 %   Neutrophils Absolute  3.5 1.4 - 7.0 x10E3/uL   Lymphocytes Absolute 2.3 0.7 - 3.1 x10E3/uL   Monocytes Absolute 0.4 0.1 - 0.9 x10E3/uL   EOS (ABSOLUTE) 0.1 0.0 - 0.4 x10E3/uL   Basophils Absolute 0.0 0.0 - 0.2 x10E3/uL   Immature Granulocytes 0 %   Immature Grans (Abs) 0.0 0.0 - 0.1 x10E3/uL      Assessment & Plan:   Problem List Items Addressed This Visit      Unprioritized   Chronic kidney disease, stage 3    Check CBC and CMP      Relevant Orders   CBC with Differential/Platelet   Comprehensive metabolic panel   Hyperlipidemia - Primary   Relevant Medications   atorvastatin (LIPITOR) 10 MG tablet   lisinopril-hydrochlorothiazide (PRINZIDE,ZESTORETIC) 10-12.5 MG tablet   Hypertension    Stable, continue present medications.        Relevant Medications   atorvastatin (LIPITOR) 10 MG tablet   lisinopril-hydrochlorothiazide (PRINZIDE,ZESTORETIC) 10-12.5 MG tablet   Other Relevant Orders   Comprehensive metabolic panel    Other Visit Diagnoses   None.      Follow up plan: Return in about 6 months (around 04/05/2017) for physical.

## 2016-10-05 NOTE — Assessment & Plan Note (Signed)
Check CBC and CMP. 

## 2016-10-05 NOTE — Addendum Note (Signed)
Addended by: Kathrine Haddock on: 10/05/2016 02:56 PM   Modules accepted: Orders

## 2016-10-06 ENCOUNTER — Encounter: Payer: Self-pay | Admitting: Unknown Physician Specialty

## 2016-10-06 LAB — CBC WITH DIFFERENTIAL/PLATELET
BASOS ABS: 0 10*3/uL (ref 0.0–0.2)
Basos: 0 %
EOS (ABSOLUTE): 0.2 10*3/uL (ref 0.0–0.4)
EOS: 2 %
HEMATOCRIT: 31 % — AB (ref 34.0–46.6)
HEMOGLOBIN: 10.1 g/dL — AB (ref 11.1–15.9)
Immature Grans (Abs): 0 10*3/uL (ref 0.0–0.1)
Immature Granulocytes: 0 %
LYMPHS ABS: 3 10*3/uL (ref 0.7–3.1)
Lymphs: 37 %
MCH: 30.5 pg (ref 26.6–33.0)
MCHC: 32.6 g/dL (ref 31.5–35.7)
MCV: 94 fL (ref 79–97)
MONOCYTES: 7 %
Monocytes Absolute: 0.6 10*3/uL (ref 0.1–0.9)
Neutrophils Absolute: 4.3 10*3/uL (ref 1.4–7.0)
Neutrophils: 54 %
Platelets: 303 10*3/uL (ref 150–379)
RBC: 3.31 x10E6/uL — ABNORMAL LOW (ref 3.77–5.28)
RDW: 13.6 % (ref 12.3–15.4)
WBC: 8 10*3/uL (ref 3.4–10.8)

## 2016-10-06 LAB — COMPREHENSIVE METABOLIC PANEL
A/G RATIO: 1.4 (ref 1.2–2.2)
ALBUMIN: 4.6 g/dL (ref 3.6–4.8)
ALK PHOS: 80 IU/L (ref 39–117)
ALT: 18 IU/L (ref 0–32)
AST: 18 IU/L (ref 0–40)
BUN / CREAT RATIO: 19 (ref 12–28)
BUN: 22 mg/dL (ref 8–27)
CHLORIDE: 103 mmol/L (ref 96–106)
CO2: 24 mmol/L (ref 18–29)
Calcium: 9.5 mg/dL (ref 8.7–10.3)
Creatinine, Ser: 1.16 mg/dL — ABNORMAL HIGH (ref 0.57–1.00)
GFR calc Af Amer: 58 mL/min/{1.73_m2} — ABNORMAL LOW (ref >59)
GFR calc non Af Amer: 51 mL/min/{1.73_m2} — ABNORMAL LOW (ref >59)
GLOBULIN, TOTAL: 3.2 g/dL (ref 1.5–4.5)
Glucose: 103 mg/dL — ABNORMAL HIGH (ref 65–99)
Potassium: 5 mmol/L (ref 3.5–5.2)
SODIUM: 141 mmol/L (ref 134–144)
Total Protein: 7.8 g/dL (ref 6.0–8.5)

## 2017-01-18 ENCOUNTER — Telehealth: Payer: Self-pay | Admitting: Unknown Physician Specialty

## 2017-01-18 NOTE — Telephone Encounter (Signed)
Called and left patient a VM letting her know that we received an order form this morning for these supplies. Kelly Poole signed it and I faxed it back. I let the patient know that this was done and I asked for her to please call me back if she had any questions or if we needed to do something else for her.

## 2017-01-19 ENCOUNTER — Telehealth: Payer: Self-pay | Admitting: Unknown Physician Specialty

## 2017-01-19 NOTE — Telephone Encounter (Signed)
Patient called regarding the order.  I gave her the information that this had been taken care of.  Just FYI.  Thank You

## 2017-03-30 ENCOUNTER — Telehealth: Payer: Self-pay | Admitting: Unknown Physician Specialty

## 2017-03-30 NOTE — Telephone Encounter (Signed)
Called and spoke to the patient. I let her know what Malachy Mood said about Ambien. She asked if there was something that she could be prescribed or recommended that she take for hot flashes. Patient states she has been bothered with them since she was 52.

## 2017-03-30 NOTE — Telephone Encounter (Signed)
Routing to provider  

## 2017-03-30 NOTE — Telephone Encounter (Signed)
Ambien is not a recommended medication for hot flashes

## 2017-03-30 NOTE — Telephone Encounter (Signed)
Pt wanted to know if she could take ambrien for hot flashes with her current medications.

## 2017-03-30 NOTE — Telephone Encounter (Signed)
Spoke with Malachy Mood about this patient in person to ask if the patient could take anything OTC. Cheryl stated estroven PM. Called and left the patient a VM (signed DPR) letting her know what Malachy Mood said . I asked for her to please give [place] a call back to schedule her f/up visit.

## 2017-03-30 NOTE — Telephone Encounter (Signed)
She is due to be seen (6 months from October visit).  We can certainly talk about it then.

## 2017-04-06 ENCOUNTER — Other Ambulatory Visit: Payer: Self-pay | Admitting: Unknown Physician Specialty

## 2017-04-06 DIAGNOSIS — Z1231 Encounter for screening mammogram for malignant neoplasm of breast: Secondary | ICD-10-CM

## 2017-05-11 ENCOUNTER — Ambulatory Visit: Payer: BLUE CROSS/BLUE SHIELD

## 2017-05-26 ENCOUNTER — Encounter: Payer: 59 | Admitting: Unknown Physician Specialty

## 2017-06-02 ENCOUNTER — Encounter: Payer: 59 | Admitting: Unknown Physician Specialty

## 2017-06-02 ENCOUNTER — Other Ambulatory Visit: Payer: Self-pay | Admitting: Unknown Physician Specialty

## 2017-06-04 ENCOUNTER — Telehealth: Payer: Self-pay | Admitting: Unknown Physician Specialty

## 2017-06-04 NOTE — Telephone Encounter (Signed)
I think Kelly Poole is due to seem me.  I sympathize with this problem.  It's best to start with Melatonin.  Other medications should be short term.  Best to schedule an appointment and we can discuss

## 2017-06-04 NOTE — Telephone Encounter (Signed)
Pt called and would like to know what she can take over the counter she can take to help her sleep. 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

## 2017-06-04 NOTE — Telephone Encounter (Signed)
Routing to provider  

## 2017-06-07 NOTE — Telephone Encounter (Signed)
Tried calling patient, no answer and VM box was full so I was unable to leave a message. Will try to call again later.  

## 2017-06-07 NOTE — Telephone Encounter (Signed)
Called and let patient know what Malachy Mood said. Patient already has appointment scheduled for 07/09/17.

## 2017-07-01 ENCOUNTER — Ambulatory Visit
Admission: RE | Admit: 2017-07-01 | Discharge: 2017-07-01 | Disposition: A | Discharge status: Home / Self Care | Payer: BLUE CROSS/BLUE SHIELD | Source: Ambulatory Visit | Attending: Unknown Physician Specialty | Admitting: Unknown Physician Specialty

## 2017-07-01 DIAGNOSIS — Z1231 Encounter for screening mammogram for malignant neoplasm of breast: Secondary | ICD-10-CM

## 2017-07-01 HISTORY — DX: Malignant neoplasm of unspecified site of unspecified female breast: C50.919

## 2017-07-01 IMAGING — MG MM DIGITAL SCREENING UNILAT*R* W/ TOMO W/ CAD
6 series · 6 of 14 positions shown · non-contrast
Comparison: Previous exam(s).

CLINICAL DATA: Screening.

EXAM:
2D DIGITAL SCREENING UNILATERAL RIGHT MAMMOGRAM WITH CAD AND ADJUNCT
TOMO

[R CC synth-2D]
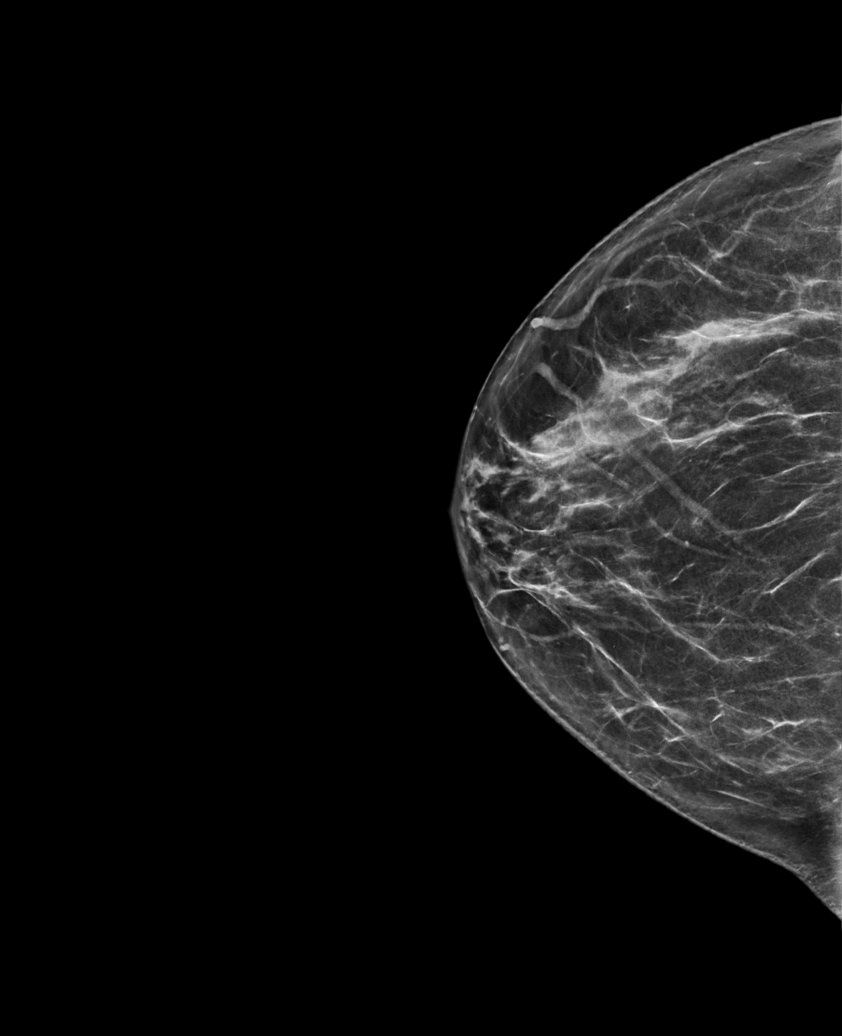

[R CC]
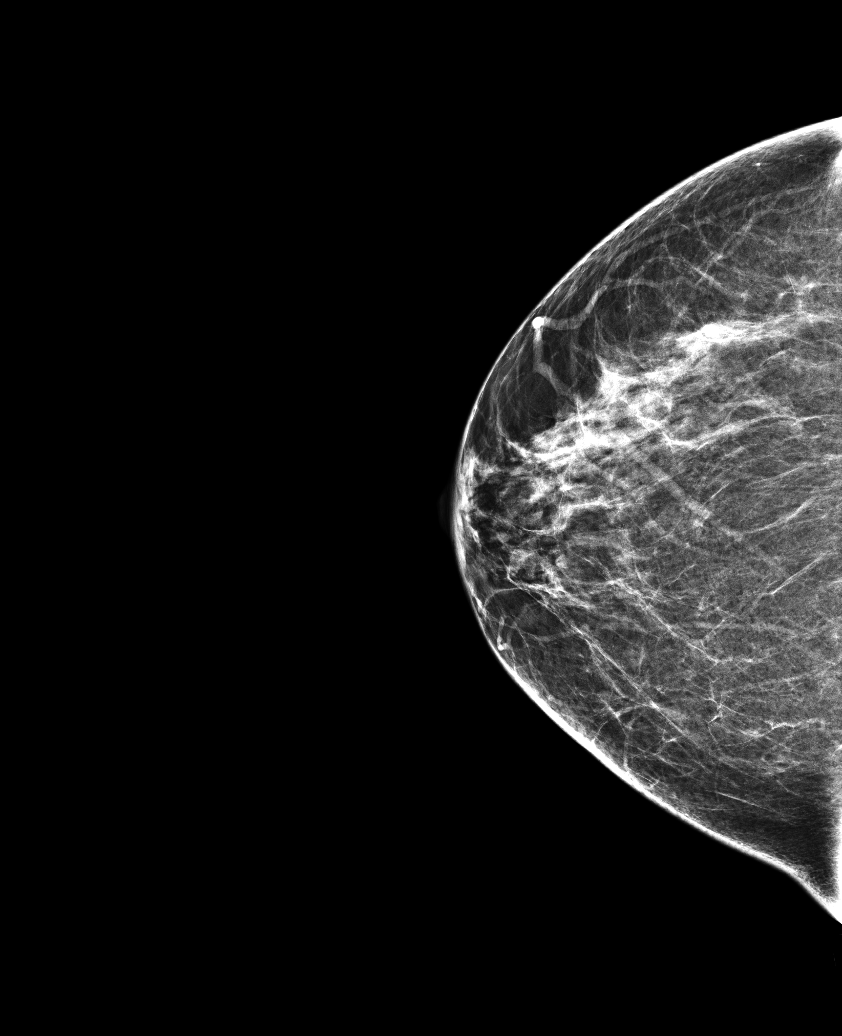

[R MLO synth-2D]
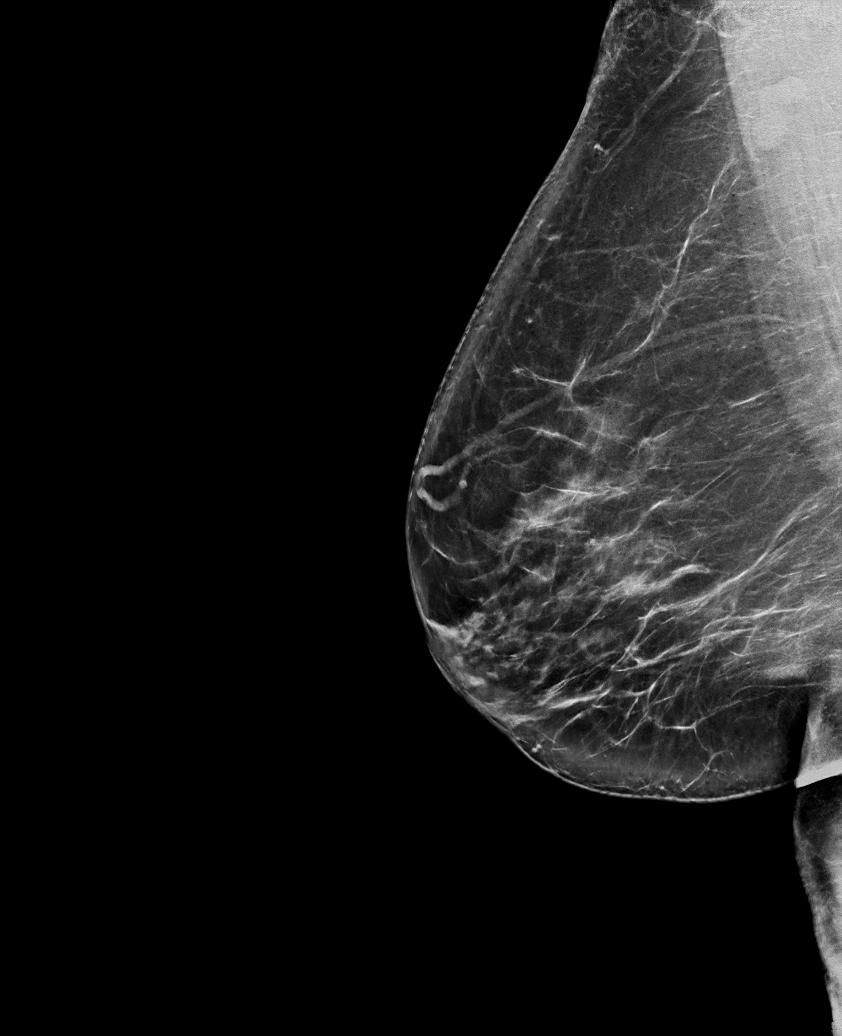

[R MLO]
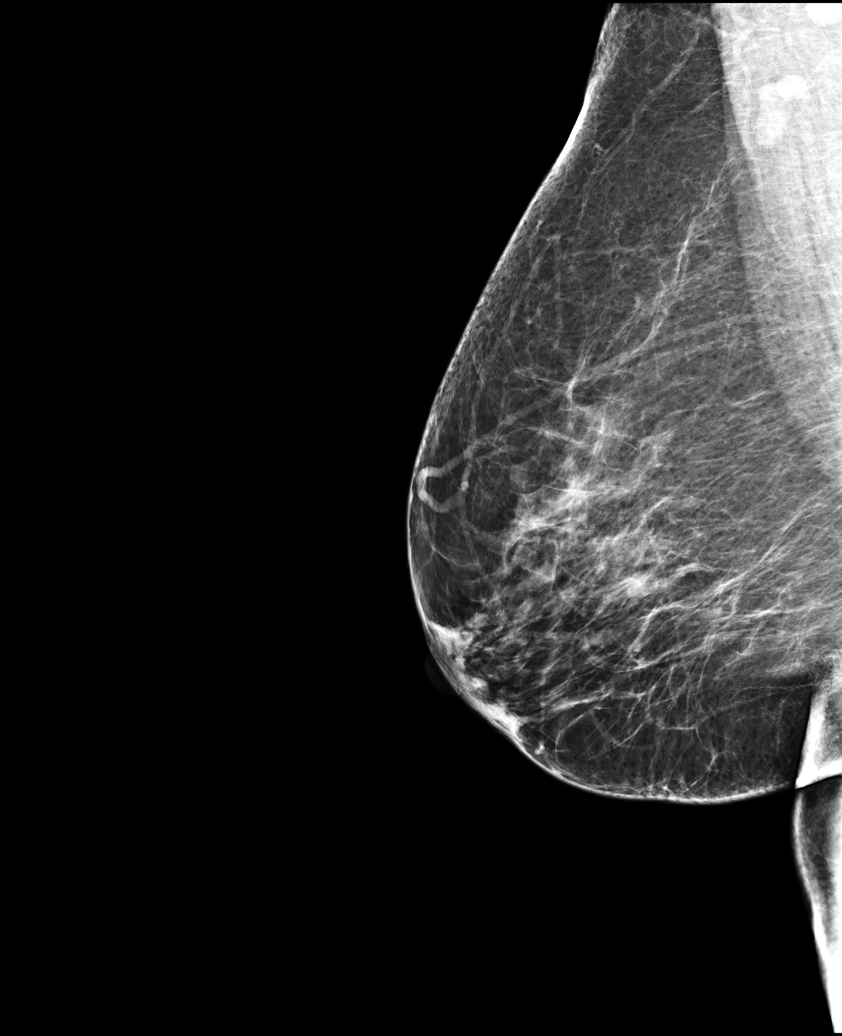

[R CC tomo · tomo slice 37/72.0]
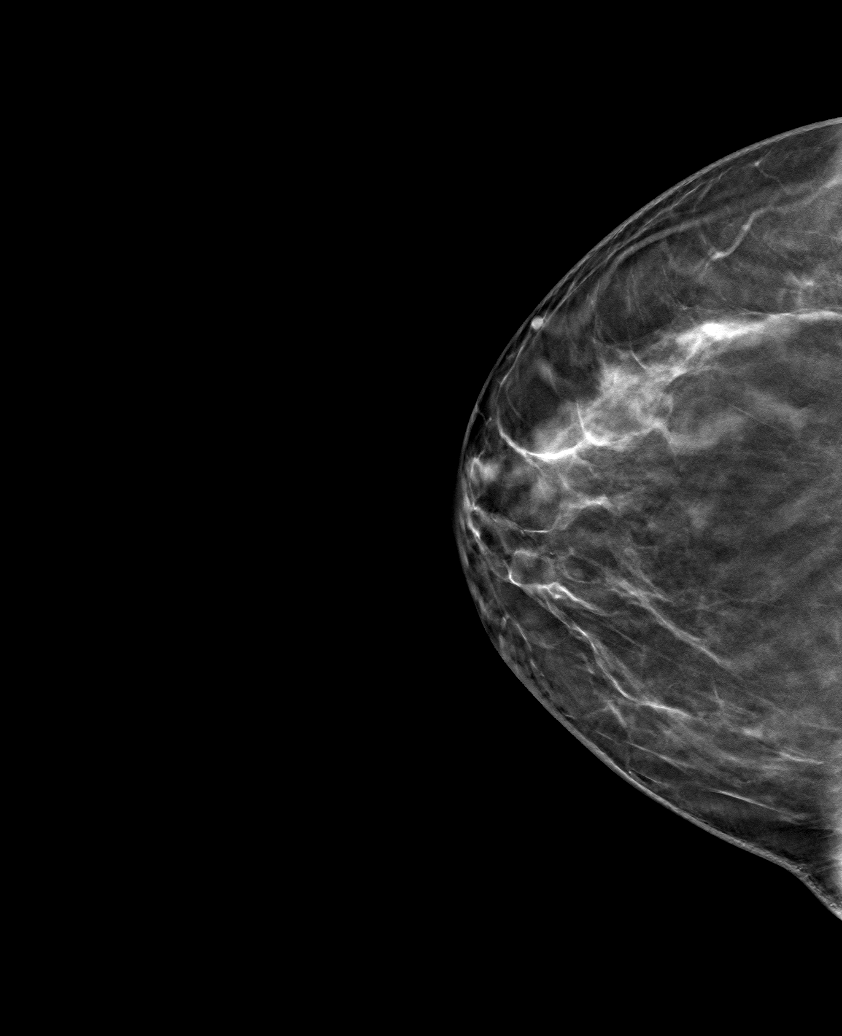

[R MLO tomo · tomo slice 45/89.0]
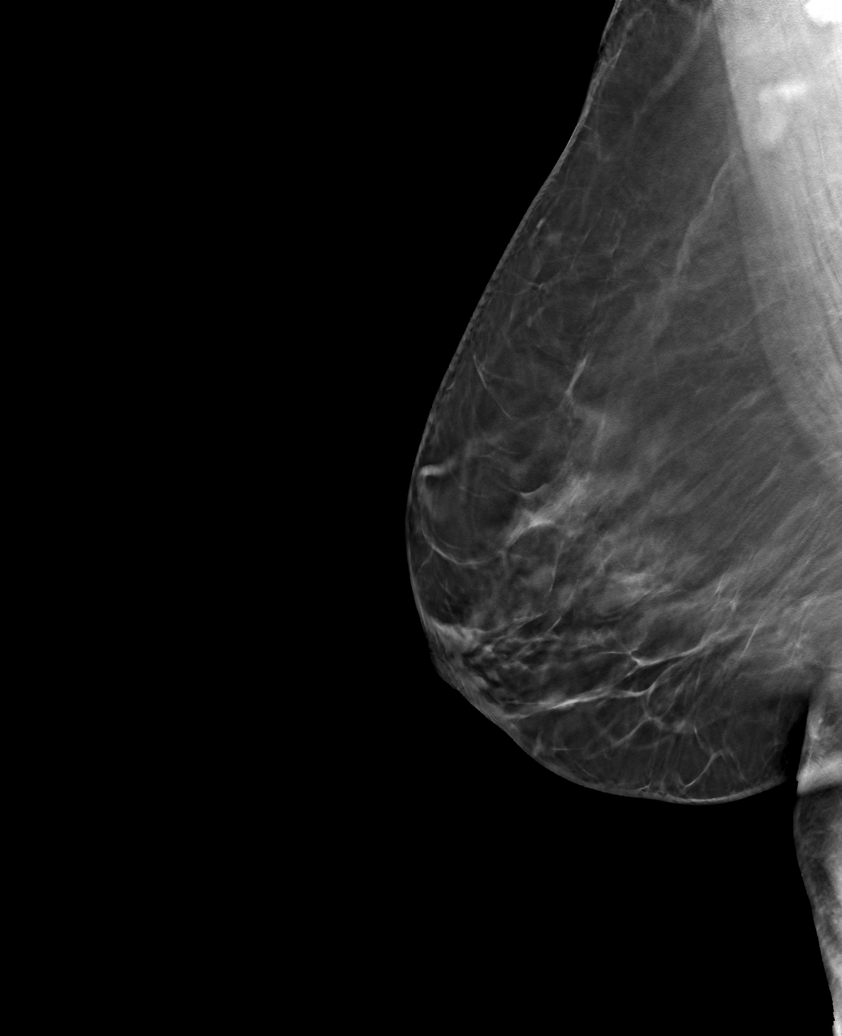

[6 of 14 positions shown; findings below may reference images not displayed]

ACR Breast Density Category c: The breast tissue is heterogeneously
dense, which may obscure small masses.
FINDINGS: There are no findings suspicious for malignancy. Images were
processed with CAD.
IMPRESSION: No mammographic evidence of malignancy. A result letter of this
screening mammogram will be mailed directly to the patient.

RECOMMENDATION:
Screening mammogram in one year. (Code:[FX])

BI-RADS CATEGORY  1: Negative.

## 2017-07-09 ENCOUNTER — Encounter: Payer: 59 | Admitting: Unknown Physician Specialty

## 2017-07-11 ENCOUNTER — Other Ambulatory Visit: Payer: Self-pay | Admitting: Unknown Physician Specialty

## 2017-07-12 ENCOUNTER — Other Ambulatory Visit: Payer: Self-pay

## 2017-07-12 MED ORDER — LISINOPRIL-HYDROCHLOROTHIAZIDE 10-12.5 MG PO TABS: 1.0000 | ORAL_TABLET | Freq: Every day | ORAL | 1 refills | Status: DC

## 2017-07-12 NOTE — Telephone Encounter (Signed)
Patient has upcoming appointment scheduled for 08/09/17.

## 2017-08-09 ENCOUNTER — Ambulatory Visit (INDEPENDENT_AMBULATORY_CARE_PROVIDER_SITE_OTHER): Payer: BLUE CROSS/BLUE SHIELD | Admitting: Unknown Physician Specialty

## 2017-08-09 ENCOUNTER — Encounter: Payer: Self-pay | Admitting: Unknown Physician Specialty

## 2017-08-09 VITALS — BP 122/83 | HR 72 | Temp 98.4°F | Ht <= 58 in | Wt 145.5 lb

## 2017-08-09 DIAGNOSIS — N183 Chronic kidney disease, stage 3 unspecified: Secondary | ICD-10-CM

## 2017-08-09 DIAGNOSIS — Z0000 Encounter for general adult medical examination without abnormal findings: Secondary | ICD-10-CM

## 2017-08-09 DIAGNOSIS — I10 Essential (primary) hypertension: Secondary | ICD-10-CM | POA: Diagnosis not present

## 2017-08-09 DIAGNOSIS — E7800 Pure hypercholesterolemia, unspecified: Secondary | ICD-10-CM

## 2017-08-09 MED ORDER — ATORVASTATIN CALCIUM 10 MG PO TABS
10.0000 mg | ORAL_TABLET | Freq: Every day | ORAL | 1 refills | Status: DC
Start: 2017-08-09 — End: 2018-01-30

## 2017-08-09 MED ORDER — OMEPRAZOLE 20 MG PO CPDR: 20.0000 mg | DELAYED_RELEASE_CAPSULE | Freq: Every day | ORAL | 3 refills | Status: DC

## 2017-08-09 MED ORDER — LISINOPRIL-HYDROCHLOROTHIAZIDE 10-12.5 MG PO TABS: 1.0000 | ORAL_TABLET | Freq: Every day | ORAL | 1 refills | Status: DC

## 2017-08-09 NOTE — Progress Notes (Signed)
BP 122/83   Pulse 72   Temp 98.4 F (36.9 C)   Ht 4\' 9"  (1.448 m)   Wt 145 lb 8 oz (66 kg)   SpO2 98%   BMI 31.49 kg/m    Subjective:    Patient ID: Kelly Poole, female    DOB: [birth year], 66 y.o.   MRN: [id number]  HPI: Kelly Poole is a 66 y.o. female  Chief Complaint  Patient presents with  . Annual Exam   Hypertension Using medications without difficulty Average home BP Doing well  No problems or lightheadedness No chest pain with exertion or shortness of breath No Edema  Hyperlipidemia Using medications without problems: No Muscle aches  Diet compliance: Improving diet Exercise: walking   Social History   Social History  . Marital status: Single    Spouse name: N/A  . Number of children: N/A  . Years of education: N/A   Occupational History  . Not on file.   Social History Main Topics  . Smoking status: Never Smoker  . Smokeless tobacco: Never Used  . Alcohol use No  . Drug use: No  . Sexual activity: Yes   Other Topics Concern  . Not on file   Social History Narrative  . No narrative on file   Family History  Problem Relation Age of Onset  . Stroke Mother   . Thyroid disease Mother   . Cancer Father   . Diabetes Maternal Aunt   . Breast cancer Neg Hx    Past Medical History:  Diagnosis Date  . Breast cancer ([place]) 1991   left breast mastectomy. No chemo or rad tx  . GERD (gastroesophageal reflux disease)   . Heart murmur   . High cholesterol   . Hypertension    Past Surgical History:  Procedure Laterality Date  . ABDOMINAL HYSTERECTOMY  1978  . MASTECTOMY Left 1991   Due to breast cancer  . TUBAL LIGATION      Relevant past medical, surgical, family and social history reviewed and updated as indicated. Interim medical history since our last visit reviewed. Allergies and medications reviewed and updated.  Review of Systems  Per HPI unless specifically indicated above     Objective:    BP 122/83   Pulse 72    Temp 98.4 F (36.9 C)   Ht 4\' 9"  (1.448 m)   Wt 145 lb 8 oz (66 kg)   SpO2 98%   BMI 31.49 kg/m   Wt Readings from Last 3 Encounters:  08/09/17 145 lb 8 oz (66 kg)  10/05/16 148 lb (67.1 kg)  12/27/15 149 lb 6.4 oz (67.8 kg)    Physical Exam  Constitutional: She is oriented to person, place, and time. She appears well-developed and well-nourished.  HENT:  Head: Normocephalic and atraumatic.  Eyes: Pupils are equal, round, and reactive to light. Right eye exhibits no discharge. Left eye exhibits no discharge. No scleral icterus.  Neck: Normal range of motion. Neck supple. Carotid bruit is not present. No thyromegaly present.  Cardiovascular: Normal rate, regular rhythm and normal heart sounds.  Exam reveals no gallop and no friction rub.   No murmur heard. Pulmonary/Chest: Effort normal and breath sounds normal. No respiratory distress. She has no wheezes. She has no rales.  Left Masectomy  Abdominal: Soft. Bowel sounds are normal. There is no tenderness. There is no rebound.  Genitourinary: No breast swelling, tenderness or discharge.  Musculoskeletal: Normal range of motion.  Lymphadenopathy:  She has no cervical adenopathy.  Neurological: She is alert and oriented to person, place, and time.  Skin: Skin is warm, dry and intact. No rash noted.  Psychiatric: She has a normal mood and affect. Her speech is normal and behavior is normal. Judgment and thought content normal. Cognition and memory are normal.    Results for orders placed or performed in visit on 10/05/16  CBC with Differential/Platelet  Result Value Ref Range   WBC 8.0 3.4 - 10.8 x10E3/uL   RBC 3.31 (L) 3.77 - 5.28 x10E6/uL   Hemoglobin 10.1 (L) 11.1 - 15.9 g/dL   Hematocrit 31.0 (L) 34.0 - 46.6 %   MCV 94 79 - 97 fL   MCH 30.5 26.6 - 33.0 pg   MCHC 32.6 31.5 - 35.7 g/dL   RDW 13.6 12.3 - 15.4 %   Platelets 303 150 - 379 x10E3/uL   Neutrophils 54 Not Estab. %   Lymphs 37 Not Estab. %   Monocytes 7 Not  Estab. %   Eos 2 Not Estab. %   Basos 0 Not Estab. %   Neutrophils Absolute 4.3 1.4 - 7.0 x10E3/uL   Lymphocytes Absolute 3.0 0.7 - 3.1 x10E3/uL   Monocytes Absolute 0.6 0.1 - 0.9 x10E3/uL   EOS (ABSOLUTE) 0.2 0.0 - 0.4 x10E3/uL   Basophils Absolute 0.0 0.0 - 0.2 x10E3/uL   Immature Granulocytes 0 Not Estab. %   Immature Grans (Abs) 0.0 0.0 - 0.1 x10E3/uL  Comprehensive metabolic panel  Result Value Ref Range   Glucose 103 (H) 65 - 99 mg/dL   BUN 22 8 - 27 mg/dL   Creatinine, Ser 1.16 (H) 0.57 - 1.00 mg/dL   GFR calc non Af Amer 51 (L) >59 mL/min/1.73   GFR calc Af Amer 58 (L) >59 mL/min/1.73   BUN/Creatinine Ratio 19 12 - 28   Sodium 141 134 - 144 mmol/L   Potassium 5.0 3.5 - 5.2 mmol/L   Chloride 103 96 - 106 mmol/L   CO2 24 18 - 29 mmol/L   Calcium 9.5 8.7 - 10.3 mg/dL   Total Protein 7.8 6.0 - 8.5 g/dL   Albumin 4.6 3.6 - 4.8 g/dL   Globulin, Total 3.2 1.5 - 4.5 g/dL   Albumin/Globulin Ratio 1.4 1.2 - 2.2   Bilirubin Total <0.2 0.0 - 1.2 mg/dL   Alkaline Phosphatase 80 39 - 117 IU/L   AST 18 0 - 40 IU/L   ALT 18 0 - 32 IU/L      Assessment & Plan:   Problem List Items Addressed This Visit      Unprioritized   Chronic kidney disease, stage 3   Relevant Orders   Comprehensive metabolic panel   Hyperlipidemia - Primary   Relevant Medications   atorvastatin (LIPITOR) 10 MG tablet   lisinopril-hydrochlorothiazide (PRINZIDE,ZESTORETIC) 10-12.5 MG tablet   Other Relevant Orders   Lipid Panel w/o Chol/HDL Ratio   Hypertension   Relevant Medications   atorvastatin (LIPITOR) 10 MG tablet   lisinopril-hydrochlorothiazide (PRINZIDE,ZESTORETIC) 10-12.5 MG tablet   Other Relevant Orders   Comprehensive metabolic panel    Other Visit Diagnoses    Routine general medical examination at a health care facility       Relevant Orders   Lipid Panel w/o Chol/HDL Ratio   Comprehensive metabolic panel   CBC with Differential/Platelet   TSH   HIV antibody   Hepatitis C  antibody       Follow up plan: Return in about 3 months (around 11/09/2017).

## 2017-08-10 ENCOUNTER — Telehealth: Payer: Self-pay | Admitting: Unknown Physician Specialty

## 2017-08-10 LAB — CBC WITH DIFFERENTIAL/PLATELET
BASOS: 1 %
Basophils Absolute: 0 10*3/uL (ref 0.0–0.2)
EOS (ABSOLUTE): 0.2 10*3/uL (ref 0.0–0.4)
EOS: 3 %
HEMATOCRIT: 31.9 % — AB (ref 34.0–46.6)
HEMOGLOBIN: 10.2 g/dL — AB (ref 11.1–15.9)
IMMATURE GRANS (ABS): 0 10*3/uL (ref 0.0–0.1)
IMMATURE GRANULOCYTES: 0 %
LYMPHS: 43 %
Lymphocytes Absolute: 2.9 10*3/uL (ref 0.7–3.1)
MCH: 29.8 pg (ref 26.6–33.0)
MCHC: 32 g/dL (ref 31.5–35.7)
MCV: 93 fL (ref 79–97)
MONOCYTES: 7 %
MONOS ABS: 0.4 10*3/uL (ref 0.1–0.9)
NEUTROS PCT: 46 %
Neutrophils Absolute: 3.1 10*3/uL (ref 1.4–7.0)
Platelets: 328 10*3/uL (ref 150–379)
RBC: 3.42 x10E6/uL — AB (ref 3.77–5.28)
RDW: 13.9 % (ref 12.3–15.4)
WBC: 6.7 10*3/uL (ref 3.4–10.8)

## 2017-08-10 LAB — COMPREHENSIVE METABOLIC PANEL
ALT: 14 IU/L (ref 0–32)
AST: 17 IU/L (ref 0–40)
Albumin/Globulin Ratio: 1.6 (ref 1.2–2.2)
Albumin: 4.7 g/dL (ref 3.6–4.8)
Alkaline Phosphatase: 86 IU/L (ref 39–117)
BUN/Creatinine Ratio: 20 (ref 12–28)
BUN: 28 mg/dL — AB (ref 8–27)
Bilirubin Total: 0.2 mg/dL (ref 0.0–1.2)
CALCIUM: 9.8 mg/dL (ref 8.7–10.3)
CO2: 21 mmol/L (ref 20–29)
Chloride: 105 mmol/L (ref 96–106)
Creatinine, Ser: 1.37 mg/dL — ABNORMAL HIGH (ref 0.57–1.00)
GFR calc Af Amer: 47 mL/min/{1.73_m2} — ABNORMAL LOW (ref >59)
GFR, EST NON AFRICAN AMERICAN: 41 mL/min/{1.73_m2} — AB (ref >59)
GLOBULIN, TOTAL: 3 g/dL (ref 1.5–4.5)
GLUCOSE: 99 mg/dL (ref 65–99)
Potassium: 4.7 mmol/L (ref 3.5–5.2)
SODIUM: 141 mmol/L (ref 134–144)
Total Protein: 7.7 g/dL (ref 6.0–8.5)

## 2017-08-10 LAB — LIPID PANEL W/O CHOL/HDL RATIO
Cholesterol, Total: 186 mg/dL (ref 100–199)
HDL: 49 mg/dL (ref >39)
LDL Calculated: 117 mg/dL — ABNORMAL HIGH (ref 0–99)
Triglycerides: 99 mg/dL (ref 0–149)
VLDL CHOLESTEROL CAL: 20 mg/dL (ref 5–40)

## 2017-08-10 LAB — HIV ANTIBODY (ROUTINE TESTING W REFLEX): HIV Screen 4th Generation wRfx: NONREACTIVE

## 2017-08-10 LAB — HEPATITIS C ANTIBODY: Hep C Virus Ab: <0.1 s/co ratio (ref 0.0–0.9)

## 2017-08-10 LAB — TSH: TSH: 3.22 u[IU]/mL (ref 0.450–4.500)

## 2017-08-10 NOTE — Telephone Encounter (Signed)
Discussed with pt with lower kidney function.  Will recheck at next visit.  States she drinks a lot of water.

## 2017-11-09 ENCOUNTER — Encounter: Payer: Self-pay | Admitting: Unknown Physician Specialty

## 2017-11-09 ENCOUNTER — Ambulatory Visit (INDEPENDENT_AMBULATORY_CARE_PROVIDER_SITE_OTHER): Payer: BLUE CROSS/BLUE SHIELD | Admitting: Unknown Physician Specialty

## 2017-11-09 DIAGNOSIS — I10 Essential (primary) hypertension: Secondary | ICD-10-CM | POA: Diagnosis not present

## 2017-11-09 DIAGNOSIS — N183 Chronic kidney disease, stage 3 unspecified: Secondary | ICD-10-CM

## 2017-11-09 DIAGNOSIS — E7800 Pure hypercholesterolemia, unspecified: Secondary | ICD-10-CM | POA: Diagnosis not present

## 2017-11-09 MED ORDER — LISINOPRIL 10 MG PO TABS: 10.0000 mg | ORAL_TABLET | Freq: Every day | ORAL | 3 refills | Status: DC

## 2017-11-09 NOTE — Assessment & Plan Note (Addendum)
Low normal.  Stop HCTZ and continue Lisinopril 10 mg.  Hopefully that will help with kidney functions.

## 2017-11-09 NOTE — Assessment & Plan Note (Addendum)
Check GFR today.  Discussed drinking plenty of fluids.  Stop HCTZ but continue Lisinopril.  Avoid NSAIDS.  Discussed possible referral to nephrology

## 2017-11-09 NOTE — Patient Instructions (Signed)
Stop Lisinopril/HCTZ and start Lisinopril 10 mg.

## 2017-11-09 NOTE — Progress Notes (Signed)
BP 101/67   Pulse 81   Temp 98.3 F (36.8 C) (Oral)   Wt 144 lb 9.6 oz (65.6 kg)   SpO2 98%   BMI 31.29 kg/m    Subjective:    Patient ID: Kelly Poole, female    DOB: [birth year], 66 y.o.   MRN: [id number]  HPI: Kelly Poole is a 66 y.o. female  Chief Complaint  Patient presents with  . Hyperlipidemia  . Hypertension   Hypertension Using medications without difficulty Average home BPs  Not checking   No problems or lightheadedness No chest pain with exertion or shortness of breath No Edema  Hyperlipidemia Using medications without problems No Muscle aches  Diet compliance:Exercise: Doing well.  Doesn't eat as much The 10-year ASCVD risk score Mikey Bussing DC Brooke Bonito., et al., 2013) is: 4.7%   Values used to calculate the score:     Age: 64 years     Sex: Female     Is Non-Hispanic African American: Yes     Diabetic: No     Tobacco smoker: No     Systolic Blood Pressure: 481 mmHg     Is BP treated: Yes     HDL Cholesterol: 49 mg/dL     Total Cholesterol: 186 mg/dL  CKD Last GFR was 47 and needs recheck and monitoring.    Relevant past medical, surgical, family and social history reviewed and updated as indicated. Interim medical history since our last visit reviewed. Allergies and medications reviewed and updated.  Review of Systems  Constitutional: Negative for activity change and fatigue.  HENT: Negative.   Genitourinary: Negative.   Musculoskeletal: Negative.   Psychiatric/Behavioral: Negative.     Per HPI unless specifically indicated above     Objective:    BP 101/67   Pulse 81   Temp 98.3 F (36.8 C) (Oral)   Wt 144 lb 9.6 oz (65.6 kg)   SpO2 98%   BMI 31.29 kg/m   Wt Readings from Last 3 Encounters:  11/09/17 144 lb 9.6 oz (65.6 kg)  08/09/17 145 lb 8 oz (66 kg)  10/05/16 148 lb (67.1 kg)    Physical Exam  Constitutional: She is oriented to person, place, and time. She appears well-developed and well-nourished. No distress.  HENT:    Head: Normocephalic and atraumatic.  Eyes: Conjunctivae and lids are normal. Right eye exhibits no discharge. Left eye exhibits no discharge. No scleral icterus.  Neck: Normal range of motion. Neck supple. No JVD present. Carotid bruit is not present.  Cardiovascular: Normal rate, regular rhythm and normal heart sounds.  Pulmonary/Chest: Effort normal and breath sounds normal.  Abdominal: Normal appearance. There is no splenomegaly or hepatomegaly.  Musculoskeletal: Normal range of motion.  Neurological: She is alert and oriented to person, place, and time.  Skin: Skin is warm, dry and intact. No rash noted. No pallor.  Psychiatric: She has a normal mood and affect. Her behavior is normal. Judgment and thought content normal.    Results for orders placed or performed in visit on 08/09/17  Lipid Panel w/o Chol/HDL Ratio  Result Value Ref Range   Cholesterol, Total 186 100 - 199 mg/dL   Triglycerides 99 0 - 149 mg/dL   HDL 49 >39 mg/dL   VLDL Cholesterol Cal 20 5 - 40 mg/dL   LDL Calculated 117 (H) 0 - 99 mg/dL  Comprehensive metabolic panel  Result Value Ref Range   Glucose 99 65 - 99 mg/dL   BUN 28 (H) 8 -  27 mg/dL   Creatinine, Ser 1.37 (H) 0.57 - 1.00 mg/dL   GFR calc non Af Amer 41 (L) >59 mL/min/1.73   GFR calc Af Amer 47 (L) >59 mL/min/1.73   BUN/Creatinine Ratio 20 12 - 28   Sodium 141 134 - 144 mmol/L   Potassium 4.7 3.5 - 5.2 mmol/L   Chloride 105 96 - 106 mmol/L   CO2 21 20 - 29 mmol/L   Calcium 9.8 8.7 - 10.3 mg/dL   Total Protein 7.7 6.0 - 8.5 g/dL   Albumin 4.7 3.6 - 4.8 g/dL   Globulin, Total 3.0 1.5 - 4.5 g/dL   Albumin/Globulin Ratio 1.6 1.2 - 2.2   Bilirubin Total 0.2 0.0 - 1.2 mg/dL   Alkaline Phosphatase 86 39 - 117 IU/L   AST 17 0 - 40 IU/L   ALT 14 0 - 32 IU/L  CBC with Differential/Platelet  Result Value Ref Range   WBC 6.7 3.4 - 10.8 x10E3/uL   RBC 3.42 (L) 3.77 - 5.28 x10E6/uL   Hemoglobin 10.2 (L) 11.1 - 15.9 g/dL   Hematocrit 31.9 (L) 34.0 -  46.6 %   MCV 93 79 - 97 fL   MCH 29.8 26.6 - 33.0 pg   MCHC 32.0 31.5 - 35.7 g/dL   RDW 13.9 12.3 - 15.4 %   Platelets 328 150 - 379 x10E3/uL   Neutrophils 46 Not Estab. %   Lymphs 43 Not Estab. %   Monocytes 7 Not Estab. %   Eos 3 Not Estab. %   Basos 1 Not Estab. %   Neutrophils Absolute 3.1 1.4 - 7.0 x10E3/uL   Lymphocytes Absolute 2.9 0.7 - 3.1 x10E3/uL   Monocytes Absolute 0.4 0.1 - 0.9 x10E3/uL   EOS (ABSOLUTE) 0.2 0.0 - 0.4 x10E3/uL   Basophils Absolute 0.0 0.0 - 0.2 x10E3/uL   Immature Granulocytes 0 Not Estab. %   Immature Grans (Abs) 0.0 0.0 - 0.1 x10E3/uL  TSH  Result Value Ref Range   TSH 3.220 0.450 - 4.500 uIU/mL  HIV antibody  Result Value Ref Range   HIV Screen 4th Generation wRfx Non Reactive Non Reactive  Hepatitis C antibody  Result Value Ref Range   Hep C Virus Ab <0.1 0.0 - 0.9 s/co ratio      Assessment & Plan:   Problem List Items Addressed This Visit      Unprioritized   Chronic kidney disease, stage 3 ([facility])    Check GFR today.  Discussed drinking plenty of fluids.  Stop HCTZ but continue Lisinopril.  Avoid NSAIDS.  Discussed possible referral to nephrology      Hyperlipidemia    On Atorvastatin.  Check lipid panel      Relevant Medications   lisinopril (PRINIVIL,ZESTRIL) 10 MG tablet   Other Relevant Orders   Lipid Panel w/o Chol/HDL Ratio   Hypertension    Low normal.  Stop HCTZ and continue Lisinopril 10 mg.  Hopefully that will help with kidney functions.         Relevant Medications   lisinopril (PRINIVIL,ZESTRIL) 10 MG tablet   Other Relevant Orders   Comprehensive metabolic panel       Follow up plan: Return in about 6 months (around 05/09/2018).

## 2017-11-09 NOTE — Assessment & Plan Note (Signed)
On Atorvastatin.  Check lipid panel

## 2017-11-10 LAB — COMPREHENSIVE METABOLIC PANEL
ALBUMIN: 4.4 g/dL (ref 3.6–4.8)
ALK PHOS: 94 IU/L (ref 39–117)
ALT: 22 IU/L (ref 0–32)
AST: 24 IU/L (ref 0–40)
Albumin/Globulin Ratio: 1.5 (ref 1.2–2.2)
BUN / CREAT RATIO: 19 (ref 12–28)
BUN: 25 mg/dL (ref 8–27)
CHLORIDE: 107 mmol/L — AB (ref 96–106)
CO2: 21 mmol/L (ref 20–29)
Calcium: 9.4 mg/dL (ref 8.7–10.3)
Creatinine, Ser: 1.35 mg/dL — ABNORMAL HIGH (ref 0.57–1.00)
GFR calc Af Amer: 48 mL/min/{1.73_m2} — ABNORMAL LOW (ref >59)
GFR calc non Af Amer: 42 mL/min/{1.73_m2} — ABNORMAL LOW (ref >59)
GLOBULIN, TOTAL: 2.9 g/dL (ref 1.5–4.5)
GLUCOSE: 101 mg/dL — AB (ref 65–99)
POTASSIUM: 4.3 mmol/L (ref 3.5–5.2)
SODIUM: 141 mmol/L (ref 134–144)
Total Protein: 7.3 g/dL (ref 6.0–8.5)

## 2017-11-10 LAB — LIPID PANEL W/O CHOL/HDL RATIO
CHOLESTEROL TOTAL: 185 mg/dL (ref 100–199)
HDL: 51 mg/dL (ref >39)
LDL Calculated: 116 mg/dL — ABNORMAL HIGH (ref 0–99)
Triglycerides: 88 mg/dL (ref 0–149)
VLDL Cholesterol Cal: 18 mg/dL (ref 5–40)

## 2017-11-16 ENCOUNTER — Telehealth: Payer: Self-pay | Admitting: Unknown Physician Specialty

## 2017-11-16 NOTE — Telephone Encounter (Signed)
Copied from [place]. Topic: Quick Communication - See Telephone Encounter >> Nov 16, 2017  8:57 AM Bea Graff, NT wrote: CRM for notification. See Telephone encounter for: Patient would like a call to go over her lab results.  11/16/17.

## 2017-11-16 NOTE — Telephone Encounter (Signed)
Routing to provider. Malachy Mood, I do not see anything about lab results or where a letter was sent. Is there anything I can tell the patient about her labs?

## 2017-11-16 NOTE — Telephone Encounter (Signed)
Yes, it is unsigned in her box as I have been meaning to call her.  Please let her know her kidney function is about the same.  I would like to recheck this in 3 months rather than 6.  Can someone make an appt for her?

## 2017-11-16 NOTE — Telephone Encounter (Signed)
Called and left patient a VM (signed DPR) about results. I let her know what Malachy Mood said and asked for the patient to please call us back to change her 6 month f/up appointment to a 3 month f/up/

## 2017-11-17 ENCOUNTER — Encounter: Payer: Self-pay | Admitting: Unknown Physician Specialty

## 2017-12-01 ENCOUNTER — Telehealth: Payer: Self-pay | Admitting: Unknown Physician Specialty

## 2017-12-01 ENCOUNTER — Ambulatory Visit: Payer: Self-pay | Admitting: *Deleted

## 2017-12-01 NOTE — Telephone Encounter (Signed)
Converted  To  Triage  Call

## 2017-12-01 NOTE — Telephone Encounter (Signed)
Sent to pcp

## 2017-12-01 NOTE — Telephone Encounter (Signed)
Message relayed to patient. Verbalized understanding and denied questions.   

## 2017-12-01 NOTE — Telephone Encounter (Signed)
Pt    Denies  Injury  Mild  Pain    Wants  To  Know  What  She  Can  Take  Due  To  Kidney  Problems     Advised  Not to  Take  Anything  Till  Office  Calls  You  Back   Reason for Disposition . Shoulder pain  Answer Assessment - Initial Assessment Questions 1. ONSET: "When did the pain start?"      YEST  2. LOCATION: "Where is the pain located?"      l  SHOULDER   3. PAIN: "How bad is the pain?" (Scale 1-10; or mild, moderate, severe)   - MILD (1-3): doesn't interfere with normal activities   - MODERATE (4-7): interferes with normal activities (e.g., work or school) or awakens from sleep   - SEVERE (8-10): excruciating pain, unable to do any normal activities, unable to move arm at all due to pain     MILD   4. WORK OR EXERCISE: "Has there been any recent work or exercise that involved this part of the body?"      NO 5. CAUSE: "What do you think is causing the shoulder pain?"       pT  THINKS  IT [place]  SLEPT ON IT WRONG 6. OTHER SYMPTOMS: "Do you have any other symptoms?" (e.g., neck pain, swelling, rash, fever, numbness, weakness)     NO 7. PREGNANCY: "Is there any chance you are pregnant?" "When was your last menstrual period?"      Hysterectomy  Protocols used: SHOULDER PAIN-A-AH

## 2017-12-01 NOTE — Telephone Encounter (Signed)
She can take Tylenol.

## 2018-01-30 ENCOUNTER — Other Ambulatory Visit: Payer: Self-pay | Admitting: Unknown Physician Specialty

## 2018-02-16 ENCOUNTER — Ambulatory Visit: Payer: BLUE CROSS/BLUE SHIELD | Admitting: Unknown Physician Specialty

## 2018-02-23 ENCOUNTER — Ambulatory Visit: Payer: Self-pay | Admitting: Unknown Physician Specialty

## 2018-02-25 ENCOUNTER — Ambulatory Visit: Payer: Self-pay | Admitting: Unknown Physician Specialty

## 2018-02-28 ENCOUNTER — Ambulatory Visit: Payer: Self-pay | Admitting: Unknown Physician Specialty

## 2018-03-08 ENCOUNTER — Ambulatory Visit: Payer: Self-pay | Admitting: Unknown Physician Specialty

## 2018-03-09 ENCOUNTER — Ambulatory Visit (INDEPENDENT_AMBULATORY_CARE_PROVIDER_SITE_OTHER): Payer: BLUE CROSS/BLUE SHIELD | Admitting: Unknown Physician Specialty

## 2018-03-09 ENCOUNTER — Encounter: Payer: Self-pay | Admitting: Unknown Physician Specialty

## 2018-03-09 DIAGNOSIS — N183 Chronic kidney disease, stage 3 unspecified: Secondary | ICD-10-CM

## 2018-03-09 DIAGNOSIS — I10 Essential (primary) hypertension: Secondary | ICD-10-CM

## 2018-03-09 NOTE — Assessment & Plan Note (Signed)
Recheck today. 

## 2018-03-09 NOTE — Progress Notes (Signed)
BP 128/83   Pulse 65   Temp 99.1 F (37.3 C) (Oral)   Ht 4\' 9"  (1.448 m)   Wt 144 lb 1.6 oz (65.4 kg)   SpO2 98%   BMI 31.18 kg/m    Subjective:    Patient ID: Kelly Poole, female    DOB: 1951/02/21, 67 y.o.   MRN: [id number]  HPI: Kelly Poole is a 67 y.o. female  Chief Complaint  Patient presents with  . Hyperlipidemia  . Hypertension  . Chronic Kidney Disease   Hypertension Last visit stopped HCTZ due to low normal BP and CKD Using medications without difficulty Average home BPs Not checking  No problems or lightheadedness No chest pain with exertion or shortness of breath No Edema  Relevant past medical, surgical, family and social history reviewed and updated as indicated. Interim medical history since our last visit reviewed. Allergies and medications reviewed and updated.  Review of Systems  Constitutional: Negative.   HENT: Negative.   Eyes: Negative.   Respiratory: Negative.   Cardiovascular: Negative.   Gastrointestinal: Negative.   Endocrine: Negative.   Genitourinary: Negative.   Musculoskeletal: Negative.   Skin: Negative.   Allergic/Immunologic: Negative.   Neurological: Negative.   Hematological: Negative.   Psychiatric/Behavioral: Negative.     Per HPI unless specifically indicated above     Objective:    BP 128/83   Pulse 65   Temp 99.1 F (37.3 C) (Oral)   Ht 4\' 9"  (1.448 m)   Wt 144 lb 1.6 oz (65.4 kg)   SpO2 98%   BMI 31.18 kg/m   Wt Readings from Last 3 Encounters:  03/09/18 144 lb 1.6 oz (65.4 kg)  11/09/17 144 lb 9.6 oz (65.6 kg)  08/09/17 145 lb 8 oz (66 kg)    Physical Exam  Constitutional: She is oriented to person, place, and time. She appears well-developed and well-nourished. No distress.  HENT:  Head: Normocephalic and atraumatic.  Eyes: Conjunctivae and lids are normal. Right eye exhibits no discharge. Left eye exhibits no discharge. No scleral icterus.  Neck: Normal range of motion. Neck supple. No  JVD present. Carotid bruit is not present.  Cardiovascular: Normal rate, regular rhythm and normal heart sounds.  Pulmonary/Chest: Effort normal and breath sounds normal.  Abdominal: Normal appearance. There is no splenomegaly or hepatomegaly.  Musculoskeletal: Normal range of motion.  Neurological: She is alert and oriented to person, place, and time.  Skin: Skin is warm, dry and intact. No rash noted. No pallor.  Psychiatric: She has a normal mood and affect. Her behavior is normal. Judgment and thought content normal.    Results for orders placed or performed in visit on 11/09/17  Comprehensive metabolic panel  Result Value Ref Range   Glucose 101 (H) 65 - 99 mg/dL   BUN 25 8 - 27 mg/dL   Creatinine, Ser 1.35 (H) 0.57 - 1.00 mg/dL   GFR calc non Af Amer 42 (L) >59 mL/min/1.73   GFR calc Af Amer 48 (L) >59 mL/min/1.73   BUN/Creatinine Ratio 19 12 - 28   Sodium 141 134 - 144 mmol/L   Potassium 4.3 3.5 - 5.2 mmol/L   Chloride 107 (H) 96 - 106 mmol/L   CO2 21 20 - 29 mmol/L   Calcium 9.4 8.7 - 10.3 mg/dL   Total Protein 7.3 6.0 - 8.5 g/dL   Albumin 4.4 3.6 - 4.8 g/dL   Globulin, Total 2.9 1.5 - 4.5 g/dL   Albumin/Globulin Ratio 1.5  1.2 - 2.2   Bilirubin Total <0.2 0.0 - 1.2 mg/dL   Alkaline Phosphatase 94 39 - 117 IU/L   AST 24 0 - 40 IU/L   ALT 22 0 - 32 IU/L  Lipid Panel w/o Chol/HDL Ratio  Result Value Ref Range   Cholesterol, Total 185 100 - 199 mg/dL   Triglycerides 88 0 - 149 mg/dL   HDL 51 >39 mg/dL   VLDL Cholesterol Cal 18 5 - 40 mg/dL   LDL Calculated 116 (H) 0 - 99 mg/dL      Assessment & Plan:   Problem List Items Addressed This Visit      Unprioritized   Chronic kidney disease, stage 3 ([place])    Recheck today.        Relevant Orders   Comprehensive metabolic panel   Hypertension    Stable, continue present medications. OK to continue off of HCTZ       Relevant Orders   Comprehensive metabolic panel       Follow up plan: Return for August for  physical.

## 2018-03-09 NOTE — Assessment & Plan Note (Signed)
Stable, continue present medications. OK to continue off of HCTZ

## 2018-03-10 ENCOUNTER — Encounter: Payer: Self-pay | Admitting: Unknown Physician Specialty

## 2018-03-10 LAB — COMPREHENSIVE METABOLIC PANEL
ALBUMIN: 4.3 g/dL (ref 3.6–4.8)
ALT: 24 IU/L (ref 0–32)
AST: 19 IU/L (ref 0–40)
Albumin/Globulin Ratio: 1.4 (ref 1.2–2.2)
Alkaline Phosphatase: 90 IU/L (ref 39–117)
BUN / CREAT RATIO: 15 (ref 12–28)
BUN: 17 mg/dL (ref 8–27)
Bilirubin Total: <0.2 mg/dL (ref 0.0–1.2)
CALCIUM: 9.5 mg/dL (ref 8.7–10.3)
CO2: 23 mmol/L (ref 20–29)
CREATININE: 1.16 mg/dL — AB (ref 0.57–1.00)
Chloride: 105 mmol/L (ref 96–106)
GFR calc Af Amer: 57 mL/min/{1.73_m2} — ABNORMAL LOW (ref >59)
GFR, EST NON AFRICAN AMERICAN: 50 mL/min/{1.73_m2} — AB (ref >59)
GLOBULIN, TOTAL: 3.1 g/dL (ref 1.5–4.5)
GLUCOSE: 98 mg/dL (ref 65–99)
Potassium: 4.5 mmol/L (ref 3.5–5.2)
SODIUM: 142 mmol/L (ref 134–144)
Total Protein: 7.4 g/dL (ref 6.0–8.5)

## 2018-03-14 ENCOUNTER — Telehealth: Payer: Self-pay | Admitting: Unknown Physician Specialty

## 2018-03-14 NOTE — Telephone Encounter (Signed)
Copied from [place] #[id number]. Topic: Quick Communication - See Telephone Encounter >> Mar 14, 2018  4:09 PM Bea Graff, NT wrote: CRM for notification. See Telephone encounter for: 03/14/18. Pt would like a call with her lab results.

## 2018-03-15 NOTE — Telephone Encounter (Signed)
Call pt 

## 2018-03-17 NOTE — Telephone Encounter (Signed)
Pt calling for update on lab results

## 2018-05-10 ENCOUNTER — Ambulatory Visit: Payer: BLUE CROSS/BLUE SHIELD | Admitting: Unknown Physician Specialty

## 2018-05-18 ENCOUNTER — Telehealth: Payer: Self-pay

## 2018-05-18 NOTE — Telephone Encounter (Signed)
Can this be written?  thanks

## 2018-05-18 NOTE — Telephone Encounter (Signed)
Copied from [place] [id number]. Topic: General - Other >> May 18, 2018 11:53 AM Yvette Rack wrote: Reason for CRM: pt calling wanting a Mastectomy bra and a prosthesis bra inserts she would like for them to go to the [facility] at [address] in [place] ([phone]    Malachy Mood, is it OK to write an order?

## 2018-05-19 NOTE — Telephone Encounter (Signed)
Patient calling because NP Wicker only sent one script for one bra. She needs at least 7 or 8. Please call patient once addition script is written.

## 2018-05-19 NOTE — Telephone Encounter (Signed)
Okay to write extra bra RX?

## 2018-05-20 NOTE — Telephone Encounter (Signed)
[place], can you find out what is needed here?  Thanks

## 2018-05-20 NOTE — Telephone Encounter (Signed)
Called and spoke to patient. She would like the order for 8 mastectomy bra's and 2 prosthesis bra inserts. Order updated with quantity and re-faxed to [facility].

## 2018-06-16 ENCOUNTER — Other Ambulatory Visit: Payer: Self-pay | Admitting: Unknown Physician Specialty

## 2018-06-16 DIAGNOSIS — Z1231 Encounter for screening mammogram for malignant neoplasm of breast: Secondary | ICD-10-CM

## 2018-07-05 ENCOUNTER — Ambulatory Visit
Admission: RE | Admit: 2018-07-05 | Discharge: 2018-07-05 | Disposition: A | Discharge status: Home / Self Care | Payer: BLUE CROSS/BLUE SHIELD | Source: Ambulatory Visit | Attending: Unknown Physician Specialty | Admitting: Unknown Physician Specialty

## 2018-07-05 DIAGNOSIS — Z1231 Encounter for screening mammogram for malignant neoplasm of breast: Secondary | ICD-10-CM | POA: Diagnosis present

## 2018-07-05 IMAGING — MG MM DIGITAL SCREENING UNILAT*R* W/ TOMO W/ CAD
4 series · 4 of 12 positions shown · non-contrast
Comparison: Previous exam(s).

CLINICAL DATA: Screening.

EXAM:
DIGITAL SCREENING UNILATERAL RIGHT MAMMOGRAM WITH CAD AND TOMO

[R MLO synth-2D]
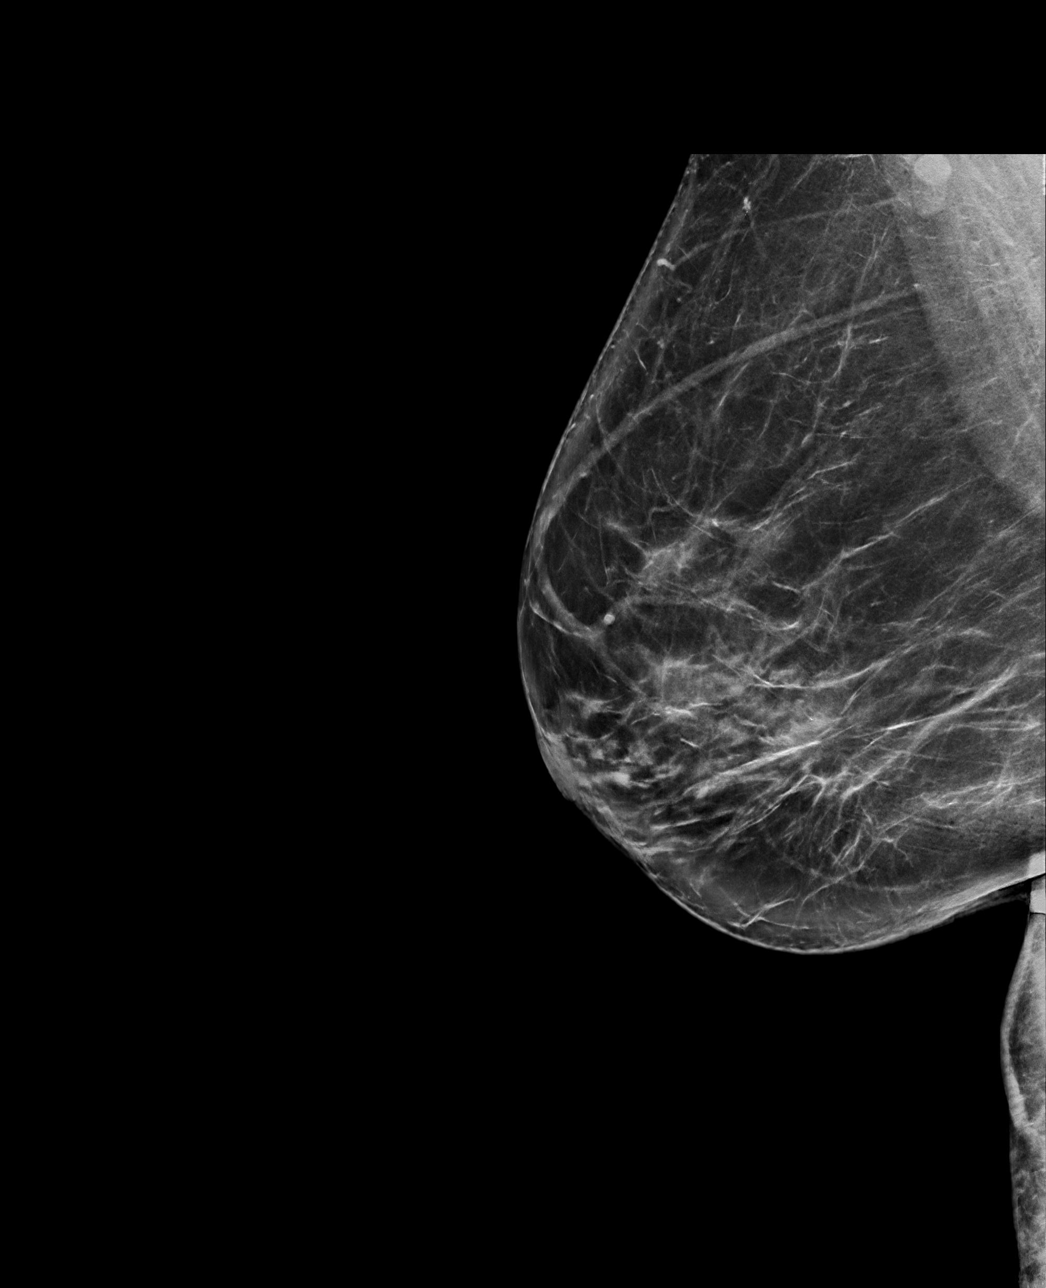

[R CC synth-2D]
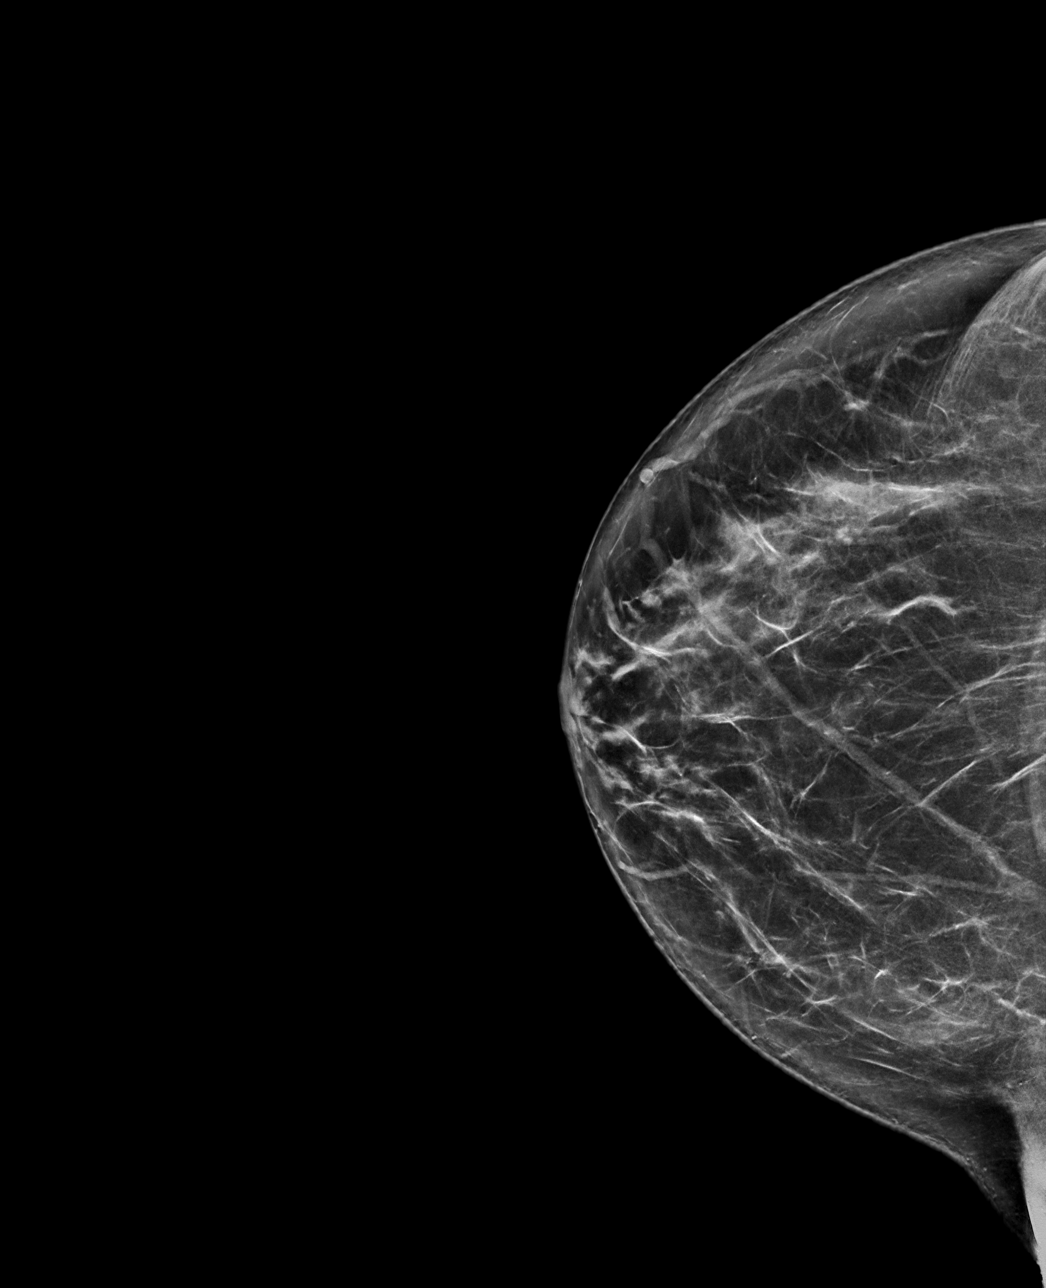

[R CC tomo · tomo slice 39/78.0]
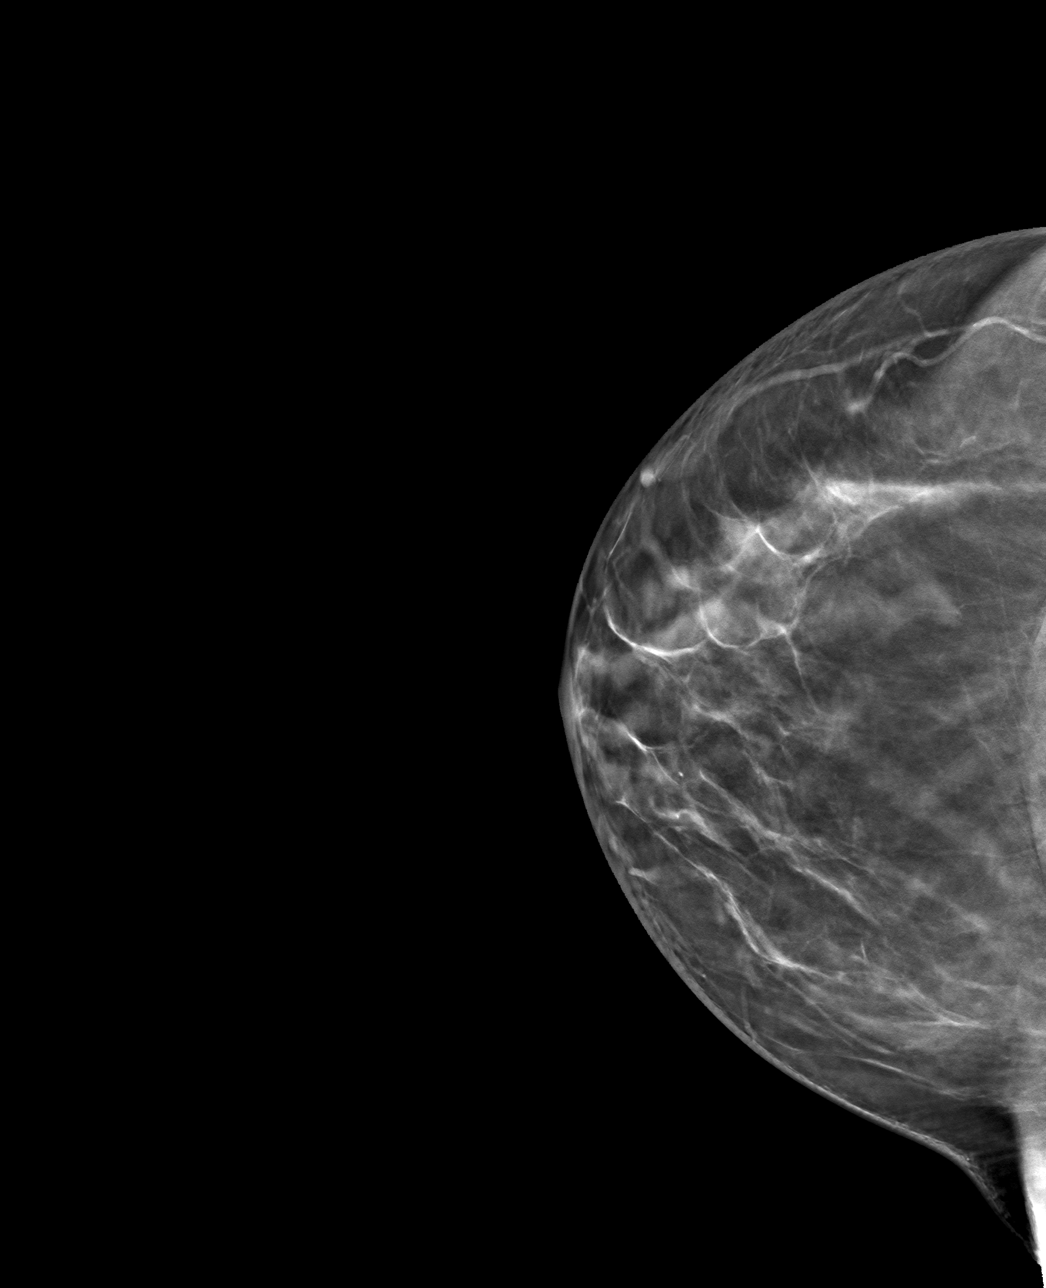

[R MLO tomo · tomo slice 44/87.0]
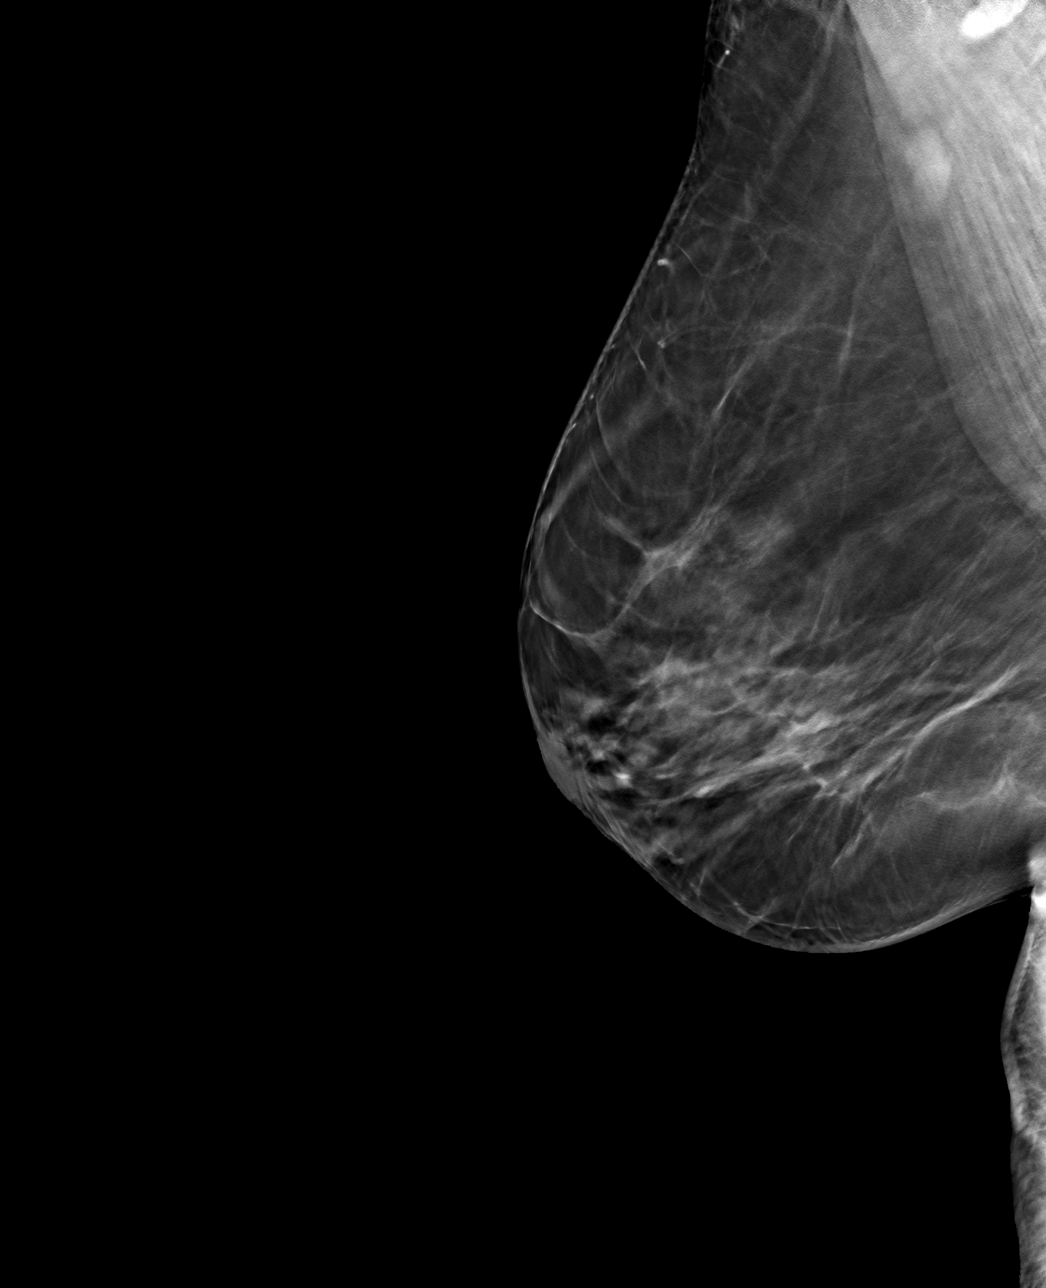

[4 of 12 positions shown; findings below may reference images not displayed]

ACR Breast Density Category b: There are scattered areas of
fibroglandular density.
FINDINGS: The patient has had a left mastectomy. There are no findings
suspicious for malignancy. Images were processed with CAD.
IMPRESSION: No mammographic evidence of malignancy. A result letter of this
screening mammogram will be mailed directly to the patient.

RECOMMENDATION:
Screening mammogram in one year.  (Code:[FD])

BI-RADS CATEGORY  1: Negative.

## 2018-07-08 ENCOUNTER — Encounter: Payer: Self-pay | Admitting: Unknown Physician Specialty

## 2018-07-11 ENCOUNTER — Telehealth: Payer: Self-pay | Admitting: Unknown Physician Specialty

## 2018-07-11 NOTE — Telephone Encounter (Signed)
Called and left patient a VM (signed DPR) letting her know about result.

## 2018-07-11 NOTE — Telephone Encounter (Signed)
Mammogram is normal.  They typically send a letter.

## 2018-07-11 NOTE — Telephone Encounter (Signed)
Routing to provider for results.

## 2018-07-11 NOTE — Telephone Encounter (Signed)
Copied from [place] [id number]. Topic: General - Other >> Jul 11, 2018 12:35 PM Yvette Rack wrote: Reason for CRM: pt calling to get mammogram results that was done last week

## 2018-08-05 ENCOUNTER — Other Ambulatory Visit: Payer: Self-pay | Admitting: Unknown Physician Specialty

## 2018-08-09 ENCOUNTER — Encounter: Payer: BLUE CROSS/BLUE SHIELD | Admitting: Physician Assistant

## 2018-08-09 ENCOUNTER — Encounter: Payer: BLUE CROSS/BLUE SHIELD | Admitting: Unknown Physician Specialty

## 2018-08-23 ENCOUNTER — Encounter: Payer: BLUE CROSS/BLUE SHIELD | Admitting: Physician Assistant

## 2018-08-23 ENCOUNTER — Encounter: Payer: Self-pay | Admitting: Physician Assistant

## 2018-08-23 ENCOUNTER — Ambulatory Visit (INDEPENDENT_AMBULATORY_CARE_PROVIDER_SITE_OTHER): Payer: BLUE CROSS/BLUE SHIELD | Admitting: Physician Assistant

## 2018-08-23 VITALS — BP 163/80 | HR 70 | Ht <= 58 in | Wt 140.0 lb

## 2018-08-23 DIAGNOSIS — Z853 Personal history of malignant neoplasm of breast: Secondary | ICD-10-CM

## 2018-08-23 DIAGNOSIS — E0521 Thyrotoxicosis with toxic multinodular goiter with thyrotoxic crisis or storm: Secondary | ICD-10-CM

## 2018-08-23 DIAGNOSIS — Z0000 Encounter for general adult medical examination without abnormal findings: Secondary | ICD-10-CM | POA: Diagnosis not present

## 2018-08-23 DIAGNOSIS — E7800 Pure hypercholesterolemia, unspecified: Secondary | ICD-10-CM

## 2018-08-23 DIAGNOSIS — I10 Essential (primary) hypertension: Secondary | ICD-10-CM

## 2018-08-23 NOTE — Progress Notes (Signed)
Subjective:    Patient ID: Kelly Poole, female    DOB: [birth year], 67 y.o.   MRN: [id number]  Kelly Poole is a 67 y.o. female presenting on 08/23/2018 for Annual Exam   HPI   Lives with fiance and grandson, 19 years with fiance. 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    Hysterectomy: 1979 for fibroids, reports she does not have cervix Mammogram: personal history breast cancer, left mastectomy in 1991, removed left breast Colonoscopy: some polyps removed but reports not precancerous, return in 10 years per patient  HTN: reports stress in family with brother who is very ill right now. Does not want to increase medication. Currently taking 10 mg Lisinopril daily.   BP Readings from Last 3 Encounters:  08/23/18 (!) 163/80  03/09/18 128/83  11/09/17 101/67   HLD: Currently taking 10 mg lipitor daily. Denies issues with this.   Social History   Tobacco Use  . Smoking status: Never Smoker  . Smokeless tobacco: Never Used  Substance Use Topics  . Alcohol use: No    Alcohol/week: 0.0 standard drinks  . Drug use: No    Review of Systems Per HPI unless specifically indicated above     Objective:    BP (!) 163/80   Pulse 70   Ht 4' 8.5" (1.435 m)   Wt 140 lb (63.5 kg)   SpO2 100%   BMI 30.83 kg/m   Wt Readings from Last 3 Encounters:  08/23/18 140 lb (63.5 kg)  03/09/18 144 lb 1.6 oz (65.4 kg)  11/09/17 144 lb 9.6 oz (65.6 kg)    Physical Exam  Constitutional: She is oriented to person, place, and time. She appears well-developed and well-nourished.  HENT:  Right Ear: External ear normal.  Left Ear: External ear normal.  Nose: Nose normal.  Mouth/Throat: Oropharynx is clear and moist. No oropharyngeal exudate.  Neck: Neck supple.  Cardiovascular: Normal rate and regular rhythm.  Pulmonary/Chest: Effort normal and breath sounds normal.  Breast exam declined.   Abdominal: Soft. Bowel sounds are normal.  Lymphadenopathy:    She has  no cervical adenopathy.  Neurological: She is alert and oriented to person, place, and time.  Skin: Skin is warm and dry.  Psychiatric: She has a normal mood and affect. Her behavior is normal.   Results for orders placed or performed in visit on 08/23/18  Comp Met (CMET)  Result Value Ref Range   Glucose 80 65 - 99 mg/dL   BUN 18 8 - 27 mg/dL   Creatinine, Ser 1.12 (H) 0.57 - 1.00 mg/dL   GFR calc non Af Amer 52 (L) >59 mL/min/1.73   GFR calc Af Amer 60 >59 mL/min/1.73   BUN/Creatinine Ratio 16 12 - 28   Sodium 142 134 - 144 mmol/L   Potassium 4.3 3.5 - 5.2 mmol/L   Chloride 101 96 - 106 mmol/L   CO2 23 20 - 29 mmol/L   Calcium 9.5 8.7 - 10.3 mg/dL   Total Protein 7.6 6.0 - 8.5 g/dL   Albumin 4.3 3.6 - 4.8 g/dL   Globulin, Total 3.3 1.5 - 4.5 g/dL   Albumin/Globulin Ratio 1.3 1.2 - 2.2   Bilirubin Total <0.2 0.0 - 1.2 mg/dL   Alkaline Phosphatase 74 39 - 117 IU/L   AST 19 0 - 40 IU/L   ALT 18 0 - 32 IU/L  Lipid Profile  Result Value Ref Range   Cholesterol, Total 165 100 - 199 mg/dL  Triglycerides 92 0 - 149 mg/dL   HDL 58 >39 mg/dL   VLDL Cholesterol Cal 18 5 - 40 mg/dL   LDL Calculated 89 0 - 99 mg/dL   Chol/HDL Ratio 2.8 0.0 - 4.4 ratio  TSH  Result Value Ref Range   TSH 1.610 0.450 - 4.500 uIU/mL      Assessment & Plan:  1. Annual physical exam   2. Essential hypertension  Continue current medications.  - Comp Met (CMET)  3. Pure hypercholesterolemia  Continue current medications.   - Lipid Profile  4. 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 HX: breast cancer  - MM Digital Screening; Future    Follow up plan: Return in about 4 months (around 12/23/2018).  Carles Collet, PA-C [facility] 08/24/2018, 3:39 PM

## 2018-08-24 LAB — COMPREHENSIVE METABOLIC PANEL
ALT: 18 IU/L (ref 0–32)
AST: 19 IU/L (ref 0–40)
Albumin/Globulin Ratio: 1.3 (ref 1.2–2.2)
Albumin: 4.3 g/dL (ref 3.6–4.8)
Alkaline Phosphatase: 74 IU/L (ref 39–117)
BUN/Creatinine Ratio: 16 (ref 12–28)
BUN: 18 mg/dL (ref 8–27)
Bilirubin Total: <0.2 mg/dL (ref 0.0–1.2)
CO2: 23 mmol/L (ref 20–29)
Calcium: 9.5 mg/dL (ref 8.7–10.3)
Chloride: 101 mmol/L (ref 96–106)
Creatinine, Ser: 1.12 mg/dL — ABNORMAL HIGH (ref 0.57–1.00)
GFR calc Af Amer: 60 mL/min/{1.73_m2} (ref >59)
GFR calc non Af Amer: 52 mL/min/{1.73_m2} — ABNORMAL LOW (ref >59)
Globulin, Total: 3.3 g/dL (ref 1.5–4.5)
Glucose: 80 mg/dL (ref 65–99)
Potassium: 4.3 mmol/L (ref 3.5–5.2)
Sodium: 142 mmol/L (ref 134–144)
Total Protein: 7.6 g/dL (ref 6.0–8.5)

## 2018-08-24 LAB — LIPID PANEL
Chol/HDL Ratio: 2.8 ratio (ref 0.0–4.4)
Cholesterol, Total: 165 mg/dL (ref 100–199)
HDL: 58 mg/dL (ref >39)
LDL Calculated: 89 mg/dL (ref 0–99)
Triglycerides: 92 mg/dL (ref 0–149)
VLDL Cholesterol Cal: 18 mg/dL (ref 5–40)

## 2018-08-24 LAB — TSH: TSH: 1.61 u[IU]/mL (ref 0.450–4.500)

## 2018-08-24 NOTE — Patient Instructions (Signed)

## 2018-11-05 ENCOUNTER — Other Ambulatory Visit: Payer: Self-pay | Admitting: Unknown Physician Specialty

## 2018-11-23 ENCOUNTER — Ambulatory Visit: Payer: BLUE CROSS/BLUE SHIELD | Admitting: Nurse Practitioner

## 2018-12-05 ENCOUNTER — Ambulatory Visit: Payer: BLUE CROSS/BLUE SHIELD | Admitting: Nurse Practitioner

## 2018-12-23 ENCOUNTER — Encounter: Payer: Self-pay | Admitting: Nurse Practitioner

## 2018-12-23 ENCOUNTER — Ambulatory Visit (INDEPENDENT_AMBULATORY_CARE_PROVIDER_SITE_OTHER): Payer: BLUE CROSS/BLUE SHIELD | Admitting: Nurse Practitioner

## 2018-12-23 VITALS — BP 126/82 | HR 75 | Temp 98.3°F | Wt 141.2 lb

## 2018-12-23 DIAGNOSIS — K219 Gastro-esophageal reflux disease without esophagitis: Secondary | ICD-10-CM | POA: Diagnosis not present

## 2018-12-23 DIAGNOSIS — E7800 Pure hypercholesterolemia, unspecified: Secondary | ICD-10-CM

## 2018-12-23 DIAGNOSIS — D518 Other vitamin B12 deficiency anemias: Secondary | ICD-10-CM

## 2018-12-23 DIAGNOSIS — N182 Chronic kidney disease, stage 2 (mild): Secondary | ICD-10-CM

## 2018-12-23 DIAGNOSIS — I1310 Hypertensive heart and chronic kidney disease without heart failure, with stage 1 through stage 4 chronic kidney disease, or unspecified chronic kidney disease: Secondary | ICD-10-CM

## 2018-12-23 MED ORDER — ATORVASTATIN CALCIUM 10 MG PO TABS: 10.0000 mg | ORAL_TABLET | Freq: Every day | ORAL | 6 refills | Status: DC

## 2018-12-23 NOTE — Assessment & Plan Note (Signed)
Chronic, stable on Omeprazole.  Reports return of symptoms without medication, when has trialed GDR in past.  Continue current dose.  Mag level today.

## 2018-12-23 NOTE — Progress Notes (Addendum)
 BP 126/82 (BP Location: Left Arm, Patient Position: Sitting)   Pulse 75   Temp 98.3 F (36.8 C) (Oral)   Wt 141 lb 3.2 oz (64 kg)   SpO2 98%   BMI 31.10 kg/m    Subjective:    Patient ID: Kelly Poole, female    DOB: 12/27/1950, 67 y.o.   MRN: [id number]  HPI: Kelly Poole is a 67 y.o. female presents for follow-up  Chief Complaint  Patient presents with  . Hyperlipidemia  . Hypertension   HYPERTENSION / HYPERLIPIDEMIA Satisfied with current treatment? yes Duration of hypertension: chronic BP monitoring frequency: not checking BP range:  BP medication side effects: no Duration of hyperlipidemia: chronic Cholesterol medication side effects: no Cholesterol supplements: none Medication compliance: good compliance Aspirin: yes Recent stressors: no Recurrent headaches: no Visual changes: no Palpitations: no Dyspnea: no Chest pain: no Lower extremity edema: no Dizzy/lightheaded: no   ANEMIA Takes B12 daily, no recent CBC or anemia panel on chart. Anemia status: stable Etiology of anemia: Duration of anemia treatment:  Compliance with treatment: good compliance Iron supplementation side effects: no Severity of anemia: mild Fatigue: no Decreased exercise tolerance: no  Dyspnea on exertion: no Palpitations: no Bleeding: no Pica: no   GERD Reports that without Omeprazole has worsening reflux.   GERD control status: controlled  Satisfied with current treatment? yes Heartburn frequency:  Medication side effects: no  Medication compliance: better Previous GERD medications: Antacid use frequency:   Duration:  Nature:  Location:  Heartburn duration:  Alleviatiating factors:   Aggravating factors:  Dysphagia: no Odynophagia:  no Hematemesis: no Blood in stool: no EGD: no   Relevant past medical, surgical, family and social history reviewed and updated as indicated. Interim medical history since our last visit reviewed. Allergies and  medications reviewed and updated.  Review of Systems  Constitutional: Negative for activity change, appetite change, diaphoresis, fatigue and fever.  Respiratory: Negative for cough, chest tightness, shortness of breath and wheezing.   Cardiovascular: Negative for chest pain, palpitations and leg swelling.  Gastrointestinal: Negative for abdominal distention, abdominal pain, constipation, diarrhea, nausea and vomiting.  Endocrine: Negative for cold intolerance, heat intolerance, polydipsia, polyphagia and polyuria.  Neurological: Negative for dizziness, syncope, weakness, light-headedness, numbness and headaches.  Psychiatric/Behavioral: Negative.     Per HPI unless specifically indicated above     Objective:    BP 126/82 (BP Location: Left Arm, Patient Position: Sitting)   Pulse 75   Temp 98.3 F (36.8 C) (Oral)   Wt 141 lb 3.2 oz (64 kg)   SpO2 98%   BMI 31.10 kg/m   Wt Readings from Last 3 Encounters:  12/23/18 141 lb 3.2 oz (64 kg)  08/23/18 140 lb (63.5 kg)  03/09/18 144 lb 1.6 oz (65.4 kg)    Physical Exam Vitals signs and nursing note reviewed.  Constitutional:      General: She is awake.     Appearance: She is well-developed.  HENT:     Head: Normocephalic.     Right Ear: Hearing normal.     Left Ear: Hearing normal.     Nose: Nose normal.     Mouth/Throat:     Mouth: Mucous membranes are moist.  Eyes:     General: Lids are normal.        Right eye: No discharge.        Left eye: No discharge.     Conjunctiva/sclera: Conjunctivae normal.     Pupils: Pupils are equal,   round, and reactive to light.  Neck:     Musculoskeletal: Normal range of motion and neck supple.     Thyroid: No thyromegaly.     Vascular: No carotid bruit or JVD.  Cardiovascular:     Rate and Rhythm: Normal rate and regular rhythm.     Heart sounds: Normal heart sounds.  Pulmonary:     Effort: Pulmonary effort is normal.     Breath sounds: Normal breath sounds.  Abdominal:      General: Bowel sounds are normal.     Palpations: Abdomen is soft. There is no hepatomegaly or splenomegaly.  Musculoskeletal:     Right lower leg: No edema.     Left lower leg: No edema.  Lymphadenopathy:     Cervical: No cervical adenopathy.  Skin:    General: Skin is warm and dry.  Neurological:     Mental Status: She is alert and oriented to person, place, and time.  Psychiatric:        Attention and Perception: Attention normal.        Mood and Affect: Mood normal.        Behavior: Behavior normal. Behavior is cooperative.        Thought Content: Thought content normal.        Judgment: Judgment normal.     Results for orders placed or performed in visit on 08/23/18  Comp Met (CMET)  Result Value Ref Range   Glucose 80 65 - 99 mg/dL   BUN 18 8 - 27 mg/dL   Creatinine, Ser 1.12 (H) 0.57 - 1.00 mg/dL   GFR calc non Af Amer 52 (L) >59 mL/min/1.73   GFR calc Af Amer 60 >59 mL/min/1.73   BUN/Creatinine Ratio 16 12 - 28   Sodium 142 134 - 144 mmol/L   Potassium 4.3 3.5 - 5.2 mmol/L   Chloride 101 96 - 106 mmol/L   CO2 23 20 - 29 mmol/L   Calcium 9.5 8.7 - 10.3 mg/dL   Total Protein 7.6 6.0 - 8.5 g/dL   Albumin 4.3 3.6 - 4.8 g/dL   Globulin, Total 3.3 1.5 - 4.5 g/dL   Albumin/Globulin Ratio 1.3 1.2 - 2.2   Bilirubin Total <0.2 0.0 - 1.2 mg/dL   Alkaline Phosphatase 74 39 - 117 IU/L   AST 19 0 - 40 IU/L   ALT 18 0 - 32 IU/L  Lipid Profile  Result Value Ref Range   Cholesterol, Total 165 100 - 199 mg/dL   Triglycerides 92 0 - 149 mg/dL   HDL 58 >39 mg/dL   VLDL Cholesterol Cal 18 5 - 40 mg/dL   LDL Calculated 89 0 - 99 mg/dL   Chol/HDL Ratio 2.8 0.0 - 4.4 ratio  TSH  Result Value Ref Range   TSH 1.610 0.450 - 4.500 uIU/mL      Assessment & Plan:   Problem List Items Addressed This Visit      Cardiovascular and Mediastinum   Hypertension - Primary    Chronic, stable.  Continue current medication regimen.  Lisinopril for kidney protection.  CMP today.       Relevant Medications   atorvastatin (LIPITOR) 10 MG tablet     Digestive   Gastroesophageal reflux disease    Chronic, stable on Omeprazole.  Reports return of symptoms without medication, when has trialed GDR in past.  Continue current dose.  Mag level today.        Relevant Orders   Magnesium       Other   Hyperlipidemia    Chronic, stable.  Continue Atorvastatin daily.  CMP today.      Relevant Medications   atorvastatin (LIPITOR) 10 MG tablet   Vitamin B12 deficiency (dietary) anemia    Chronic, continue B12 supplement daily.  CBC and anemia panel today, has not been checked since August 2018.      Relevant Orders   Anemia panel   CBC with Differential/Platelet       Follow up plan: Return in about 6 months (around 06/23/2019) for HTN/HLD and anemia.

## 2018-12-23 NOTE — Assessment & Plan Note (Signed)
Chronic, stable.  Continue Atorvastatin daily.  CMP today.

## 2018-12-23 NOTE — Assessment & Plan Note (Signed)
Chronic, continue B12 supplement daily.  CBC and anemia panel today, has not been checked since August 2018.

## 2018-12-23 NOTE — Patient Instructions (Signed)
Anemia  Anemia is a condition in which you do not have enough red blood cells or hemoglobin. Hemoglobin is a substance in red blood cells that carries oxygen. When you do not have enough red blood cells or hemoglobin (are anemic), your body cannot get enough oxygen and your organs may not work properly. As a result, you may feel very tired or have other problems. What are the causes? Common causes of anemia include:  Excessive bleeding. Anemia can be caused by excessive bleeding inside or outside the body, including bleeding from the intestine or from periods in women.  Poor nutrition.  Long-lasting (chronic) kidney, thyroid, and liver disease.  Bone marrow disorders.  Cancer and treatments for cancer.  HIV (human immunodeficiency virus) and AIDS (acquired immunodeficiency syndrome).  Treatments for HIV and AIDS.  Spleen problems.  Blood disorders.  Infections, medicines, and autoimmune disorders that destroy red blood cells. What are the signs or symptoms? Symptoms of this condition include:  Minor weakness.  Dizziness.  Headache.  Feeling heartbeats that are irregular or faster than normal (palpitations).  Shortness of breath, especially with exercise.  Paleness.  Cold sensitivity.  Indigestion.  Nausea.  Difficulty sleeping.  Difficulty concentrating. Symptoms may occur suddenly or develop slowly. If your anemia is mild, you may not have symptoms. How is this diagnosed? This condition is diagnosed based on:  Blood tests.  Your medical history.  A physical exam.  Bone marrow biopsy. Your health care provider may also check your stool (feces) for blood and may do additional testing to look for the cause of your bleeding. You may also have other tests, including:  Imaging tests, such as a CT scan or MRI.  Endoscopy.  Colonoscopy. How is this treated? Treatment for this condition depends on the cause. If you continue to lose a lot of blood, you may  need to be treated at a hospital. Treatment may include:  Taking supplements of iron, vitamin S31, or folic acid.  Taking a hormone medicine (erythropoietin) that can help to stimulate red blood cell growth.  Having a blood transfusion. This may be needed if you lose a lot of blood.  Making changes to your diet.  Having surgery to remove your spleen. Follow these instructions at home:  Take over-the-counter and prescription medicines only as told by your health care provider.  Take supplements only as told by your health care provider.  Follow any diet instructions that you were given.  Keep all follow-up visits as told by your health care provider. This is important. Contact a health care provider if:  You develop new bleeding anywhere in the body. Get help right away if:  You are very weak.  You are short of breath.  You have pain in your abdomen or chest.  You are dizzy or feel faint.  You have trouble concentrating.  You have bloody or black, tarry stools.  You vomit repeatedly or you vomit up blood. Summary  Anemia is a condition in which you do not have enough red blood cells or enough of a substance in your red blood cells that carries oxygen (hemoglobin).  Symptoms may occur suddenly or develop slowly.  If your anemia is mild, you may not have symptoms.  This condition is diagnosed with blood tests as well as a medical history and physical exam. Other tests may be needed.  Treatment for this condition depends on the cause of the anemia. This information is not intended to replace advice given to you by  your health care provider. Make sure you discuss any questions you have with your health care provider. Document Released: 01/14/2005 Document Revised: 01/08/2017 Document Reviewed: 01/08/2017 Elsevier Interactive Patient Education  2019 [employer].

## 2018-12-23 NOTE — Assessment & Plan Note (Addendum)
Chronic, stable.  Continue current medication regimen.  Lisinopril for kidney protection.  CMP today.

## 2018-12-26 LAB — CBC WITH DIFFERENTIAL/PLATELET
BASOS ABS: 0.1 10*3/uL (ref 0.0–0.2)
Basos: 1 %
EOS (ABSOLUTE): 0.1 10*3/uL (ref 0.0–0.4)
EOS: 1 %
Hemoglobin: 10.9 g/dL — ABNORMAL LOW (ref 11.1–15.9)
Immature Grans (Abs): 0 10*3/uL (ref 0.0–0.1)
Immature Granulocytes: 0 %
LYMPHS ABS: 2.7 10*3/uL (ref 0.7–3.1)
Lymphs: 27 %
MCH: 31 pg (ref 26.6–33.0)
MCHC: 33.7 g/dL (ref 31.5–35.7)
MCV: 92 fL (ref 79–97)
MONOCYTES: 7 %
MONOS ABS: 0.7 10*3/uL (ref 0.1–0.9)
NEUTROS ABS: 6.2 10*3/uL (ref 1.4–7.0)
Neutrophils: 64 %
PLATELETS: 274 10*3/uL (ref 150–450)
RBC: 3.52 x10E6/uL — AB (ref 3.77–5.28)
RDW: 13 % (ref 12.3–15.4)
WBC: 9.7 10*3/uL (ref 3.4–10.8)

## 2018-12-26 LAB — ANEMIA PANEL
FOLATE, RBC: 1372 ng/mL (ref >498)
Ferritin: 145 ng/mL (ref 15–150)
Folate, Hemolysate: 443 ng/mL
Hematocrit: 32.3 % — ABNORMAL LOW (ref 34.0–46.6)
IRON SATURATION: 16 % (ref 15–55)
Iron: 36 ug/dL (ref 27–139)
RETIC CT PCT: 1.2 % (ref 0.6–2.6)
TIBC: 220 ug/dL — AB (ref 250–450)
UIBC: 184 ug/dL (ref 118–369)
Vitamin B-12: 1535 pg/mL — ABNORMAL HIGH (ref 232–1245)

## 2018-12-26 LAB — COMPREHENSIVE METABOLIC PANEL
ALT: 17 IU/L (ref 0–32)
AST: 18 IU/L (ref 0–40)
Albumin/Globulin Ratio: 1.5 (ref 1.2–2.2)
Albumin: 4.5 g/dL (ref 3.6–4.8)
Alkaline Phosphatase: 79 IU/L (ref 39–117)
BUN/Creatinine Ratio: 15 (ref 12–28)
BUN: 17 mg/dL (ref 8–27)
CO2: 20 mmol/L (ref 20–29)
Calcium: 9.6 mg/dL (ref 8.7–10.3)
Chloride: 105 mmol/L (ref 96–106)
Creatinine, Ser: 1.12 mg/dL — ABNORMAL HIGH (ref 0.57–1.00)
GFR calc Af Amer: 60 mL/min/{1.73_m2} (ref >59)
GFR calc non Af Amer: 52 mL/min/{1.73_m2} — ABNORMAL LOW (ref >59)
GLOBULIN, TOTAL: 3 g/dL (ref 1.5–4.5)
Glucose: 80 mg/dL (ref 65–99)
Potassium: 4.3 mmol/L (ref 3.5–5.2)
SODIUM: 143 mmol/L (ref 134–144)
Total Protein: 7.5 g/dL (ref 6.0–8.5)

## 2018-12-26 LAB — MAGNESIUM: Magnesium: 1.7 mg/dL (ref 1.6–2.3)

## 2019-01-04 ENCOUNTER — Telehealth: Payer: Self-pay | Admitting: Nurse Practitioner

## 2019-01-04 ENCOUNTER — Ambulatory Visit: Payer: Self-pay

## 2019-01-04 NOTE — Telephone Encounter (Signed)
Call place to patient. Left VM to call office.

## 2019-01-04 NOTE — Telephone Encounter (Signed)
Message from Conception Chancy, NT sent at 01/04/2019 2:59 PM EST   Patient would like to know if it is something over the counter she can get for hot flashes or if that is something that would have to be prescribed from the doctor.   Okay to leave voicemail.   Voicemail left for pt to return call to office x 2 but no call back.

## 2019-01-04 NOTE — Telephone Encounter (Signed)
Spoke to patient via telephone about hot flashes.  She is currently taking 100 units of Vitamin E a day, recommended she trial going up to 400 units a day.  If no benefit then recommend she make an appointment and we can discuss other medication options prescription wise that are available to her.

## 2019-02-07 ENCOUNTER — Telehealth: Payer: Self-pay | Admitting: Nurse Practitioner

## 2019-02-07 NOTE — Telephone Encounter (Signed)
Copied from [place] [id number]. Topic: General - Other >> Feb 07, 2019  1:26 PM Yvette Rack wrote: Reason for CRM: RX for a  10 mastectomy bra and 1 prosthesis  . RX needs to be faxed to [phone] [facility]).

## 2019-02-07 NOTE — Telephone Encounter (Signed)
Order written, signed by provider and faxed to [facility] along w/ Demographic and insurance. Pt was made aware.

## 2019-02-07 NOTE — Telephone Encounter (Signed)
Can you all lend me a hand and help me get a script ready for this and fax it for me?  Thanks.

## 2019-02-17 DIAGNOSIS — Z4432 Encounter for fitting and adjustment of external left breast prosthesis: Secondary | ICD-10-CM | POA: Diagnosis not present

## 2019-02-17 DIAGNOSIS — C50112 Malignant neoplasm of central portion of left female breast: Secondary | ICD-10-CM | POA: Diagnosis not present

## 2019-02-20 ENCOUNTER — Encounter: Payer: Self-pay | Admitting: Family Medicine

## 2019-02-20 ENCOUNTER — Ambulatory Visit (INDEPENDENT_AMBULATORY_CARE_PROVIDER_SITE_OTHER): Payer: Medicare HMO | Admitting: Family Medicine

## 2019-02-20 VITALS — BP 160/84 | HR 80 | Temp 98.2°F | Wt 142.6 lb

## 2019-02-20 DIAGNOSIS — N3000 Acute cystitis without hematuria: Secondary | ICD-10-CM

## 2019-02-20 DIAGNOSIS — R300 Dysuria: Secondary | ICD-10-CM | POA: Diagnosis not present

## 2019-02-20 MED ORDER — NITROFURANTOIN MONOHYD MACRO 100 MG PO CAPS: 100.0000 mg | ORAL_CAPSULE | Freq: Two times a day (BID) | ORAL | 0 refills | Status: DC

## 2019-02-20 NOTE — Progress Notes (Signed)
BP (!) 160/84   Pulse 80   Temp 98.2 F (36.8 C) (Oral)   Wt 142 lb 9.6 oz (64.7 kg)   SpO2 98%   BMI 31.41 kg/m    Subjective:    Patient ID: Kelly Poole, female    DOB: 1951/06/03, 68 y.o.   MRN: [id number]  HPI: Kelly Poole is a 68 y.o. female  Chief Complaint  Patient presents with  . Urinary Tract Infection    pt states she has had pain and burning with urination for the past 3 to 4 days    URINARY SYMPTOMS Duration: about a week Dysuria: yes Urinary frequency: yes Urgency: yes Small volume voids: yes Symptom severity: moderate Urinary incontinence: no Foul odor: yes Hematuria: no Abdominal pain: no Back pain: no Suprapubic pain/pressure: no Flank pain: no Fever:  no Vomiting: no Relief with cranberry juice: no Relief with pyridium: no Status: worse Previous urinary tract infection: yes Recurrent urinary tract infection: no Treatments attempted: increasing fluids  Relevant past medical, surgical, family and social history reviewed and updated as indicated. Interim medical history since our last visit reviewed. Allergies and medications reviewed and updated.  Review of Systems  Constitutional: Negative.   Respiratory: Negative.   Cardiovascular: Negative.   Gastrointestinal: Positive for abdominal pain. Negative for abdominal distention, anal bleeding, blood in stool, constipation, diarrhea, nausea, rectal pain and vomiting.  Genitourinary: Positive for dysuria, frequency and urgency. Negative for decreased urine volume, difficulty urinating, dyspareunia, enuresis, flank pain, genital sores, hematuria, menstrual problem, pelvic pain, vaginal bleeding, vaginal discharge and vaginal pain.  Skin: Negative.   Neurological: Negative.   Psychiatric/Behavioral: Negative.     Per HPI unless specifically indicated above     Objective:    BP (!) 160/84   Pulse 80   Temp 98.2 F (36.8 C) (Oral)   Wt 142 lb 9.6 oz (64.7 kg)   SpO2 98%   BMI  31.41 kg/m   Wt Readings from Last 3 Encounters:  02/20/19 142 lb 9.6 oz (64.7 kg)  12/23/18 141 lb 3.2 oz (64 kg)  08/23/18 140 lb (63.5 kg)    Physical Exam Vitals signs and nursing note reviewed.  Constitutional:      General: She is not in acute distress.    Appearance: Normal appearance. She is not ill-appearing, toxic-appearing or diaphoretic.  HENT:     Head: Normocephalic and atraumatic.     Right Ear: External ear normal.     Left Ear: External ear normal.     Nose: Nose normal.     Mouth/Throat:     Mouth: Mucous membranes are moist.     Pharynx: Oropharynx is clear.  Eyes:     General: No scleral icterus.       Right eye: No discharge.        Left eye: No discharge.     Extraocular Movements: Extraocular movements intact.     Conjunctiva/sclera: Conjunctivae normal.     Pupils: Pupils are equal, round, and reactive to light.  Neck:     Musculoskeletal: Normal range of motion and neck supple.  Cardiovascular:     Rate and Rhythm: Normal rate and regular rhythm.     Pulses: Normal pulses.     Heart sounds: Normal heart sounds. No murmur. No friction rub. No gallop.   Pulmonary:     Effort: Pulmonary effort is normal. No respiratory distress.     Breath sounds: Normal breath sounds. No stridor. No wheezing, rhonchi  or rales.  Chest:     Chest wall: No tenderness.  Musculoskeletal: Normal range of motion.  Skin:    General: Skin is warm and dry.     Capillary Refill: Capillary refill takes less than 2 seconds.     Coloration: Skin is not jaundiced or pale.     Findings: No bruising, erythema, lesion or rash.  Neurological:     General: No focal deficit present.     Mental Status: She is alert and oriented to person, place, and time. Mental status is at baseline.  Psychiatric:        Mood and Affect: Mood normal.        Behavior: Behavior normal.        Thought Content: Thought content normal.        Judgment: Judgment normal.     Results for orders placed  or performed in visit on 12/23/18  Anemia panel  Result Value Ref Range   Total Iron Binding Capacity 220 (L) 250 - 450 ug/dL   UIBC 184 118 - 369 ug/dL   Iron 36 27 - 139 ug/dL   Iron Saturation 16 15 - 55 %   Vitamin B-12 1,535 (H) 232 - 1,245 pg/mL   Folate, Hemolysate 443.0 Not Estab. ng/mL   Hematocrit 32.3 (L) 34.0 - 46.6 %   Folate, RBC 1,372 >498 ng/mL   Ferritin 145 15 - 150 ng/mL   Retic Ct Pct 1.2 0.6 - 2.6 %  CBC with Differential/Platelet  Result Value Ref Range   WBC 9.7 3.4 - 10.8 x10E3/uL   RBC 3.52 (L) 3.77 - 5.28 x10E6/uL   Hemoglobin 10.9 (L) 11.1 - 15.9 g/dL   MCV 92 79 - 97 fL   MCH 31.0 26.6 - 33.0 pg   MCHC 33.7 31.5 - 35.7 g/dL   RDW 13.0 12.3 - 15.4 %   Platelets 274 150 - 450 x10E3/uL   Neutrophils 64 Not Estab. %   Lymphs 27 Not Estab. %   Monocytes 7 Not Estab. %   Eos 1 Not Estab. %   Basos 1 Not Estab. %   Neutrophils Absolute 6.2 1.4 - 7.0 x10E3/uL   Lymphocytes Absolute 2.7 0.7 - 3.1 x10E3/uL   Monocytes Absolute 0.7 0.1 - 0.9 x10E3/uL   EOS (ABSOLUTE) 0.1 0.0 - 0.4 x10E3/uL   Basophils Absolute 0.1 0.0 - 0.2 x10E3/uL   Immature Granulocytes 0 Not Estab. %   Immature Grans (Abs) 0.0 0.0 - 0.1 x10E3/uL  Comprehensive metabolic panel  Result Value Ref Range   Glucose 80 65 - 99 mg/dL   BUN 17 8 - 27 mg/dL   Creatinine, Ser 1.12 (H) 0.57 - 1.00 mg/dL   GFR calc non Af Amer 52 (L) >59 mL/min/1.73   GFR calc Af Amer 60 >59 mL/min/1.73   BUN/Creatinine Ratio 15 12 - 28   Sodium 143 134 - 144 mmol/L   Potassium 4.3 3.5 - 5.2 mmol/L   Chloride 105 96 - 106 mmol/L   CO2 20 20 - 29 mmol/L   Calcium 9.6 8.7 - 10.3 mg/dL   Total Protein 7.5 6.0 - 8.5 g/dL   Albumin 4.5 3.6 - 4.8 g/dL   Globulin, Total 3.0 1.5 - 4.5 g/dL   Albumin/Globulin Ratio 1.5 1.2 - 2.2   Bilirubin Total <0.2 0.0 - 1.2 mg/dL   Alkaline Phosphatase 79 39 - 117 IU/L   AST 18 0 - 40 IU/L   ALT 17 0 - 32 IU/L  Magnesium  Result Value Ref Range   Magnesium 1.7 1.6 - 2.3  mg/dL      Assessment & Plan:   Problem List Items Addressed This Visit    None    Visit Diagnoses    Acute cystitis without hematuria    -  Primary   Will treat with nitrofurantoin. Call with any concerns or if not getting better. Continue to monitor.    Dysuria       3+ leuks   Relevant Orders   UA/M w/rflx Culture, Routine       Follow up plan: Return As scheduled with PCP in July.

## 2019-02-22 LAB — UA/M W/RFLX CULTURE, ROUTINE
Bilirubin, UA: NEGATIVE
Glucose, UA: NEGATIVE
Ketones, UA: NEGATIVE
Nitrite, UA: NEGATIVE
Protein, UA: NEGATIVE
RBC, UA: NEGATIVE
Specific Gravity, UA: 1.02 (ref 1.005–1.030)
Urobilinogen, Ur: 0.2 mg/dL (ref 0.2–1.0)
pH, UA: 5.5 (ref 5.0–7.5)

## 2019-02-22 LAB — URINE CULTURE, REFLEX

## 2019-02-22 LAB — MICROSCOPIC EXAMINATION: RBC, UA: NONE SEEN /hpf (ref 0–2)

## 2019-02-28 ENCOUNTER — Telehealth: Payer: Self-pay | Admitting: Nurse Practitioner

## 2019-02-28 ENCOUNTER — Encounter: Payer: Self-pay | Admitting: Nurse Practitioner

## 2019-02-28 ENCOUNTER — Ambulatory Visit (INDEPENDENT_AMBULATORY_CARE_PROVIDER_SITE_OTHER): Payer: Medicare HMO | Admitting: Nurse Practitioner

## 2019-02-28 VITALS — BP 148/91 | HR 82 | Temp 98.3°F | Ht <= 58 in | Wt 139.5 lb

## 2019-02-28 DIAGNOSIS — J029 Acute pharyngitis, unspecified: Secondary | ICD-10-CM | POA: Diagnosis not present

## 2019-02-28 NOTE — Progress Notes (Signed)
BP (!) 148/91 (BP Location: Left Arm, Patient Position: Sitting, Cuff Size: Normal)   Pulse 82   Temp 98.3 F (36.8 C)   Ht 4' 8.5" (1.435 m)   Wt 139 lb 8 oz (63.3 kg)   SpO2 98%   BMI 30.72 kg/m    Subjective:    Patient ID: Kelly Poole, female    DOB: [birth year], 68 y.o.   MRN: [id number]  HPI: Kelly Poole is a 68 y.o. female  Chief Complaint  Patient presents with  . Sore Throat    Patient states that he throat started hurting when she started taking the antibiotics on 3/2, patient states that it was hard to swallow due to the pain   SORE THROAT: Was placed on Macrobid for a UTI 02/20/19 and reports sore throat started with the first dose of this medication.  Last dose of Macrobid was yesterday. States throat is sore and burning and it can be hard to swallow at times. Denies SOB or CP. Pt reports symptoms only present shortly after swallowing the Macrobid and once this morning (after completion of the medication). Recommend gargling with warm salt water, increased fluid intake, chloraseptic throat spray and lozenges, and Claritin or Zyrtec for allergy symptoms in throat.  Worst symptom: burning in throat Fever: no Cough: yes Shortness of breath: no Wheezing: no Chest pain: no Chest tightness: yes Chest congestion: no Nasal congestion: no Runny nose: no Post nasal drip: no Sneezing: no Sore throat: yes- worse immediately after taking Macrobid and then returned again this morning once Swollen glands: no Sinus pressure: no Headache: no Face pain: no Toothache: no Ear pain: no  Ear pressure: no  Eyes red/itching:no Eye drainage/crusting: no  Vomiting: no Rash: no Fatigue: no Sick contacts: no Strep contacts: no  Context: fluctuating Recurrent sinusitis: no Relief with OTC cold/cough medications: no  Treatments attempted: none   Relevant past medical, surgical, family and social history reviewed and updated as indicated. Interim medical history since  our last visit reviewed. Allergies and medications reviewed and updated.  Review of Systems Per HPI unless specifically indicated above     Objective:    BP (!) 148/91 (BP Location: Left Arm, Patient Position: Sitting, Cuff Size: Normal)   Pulse 82   Temp 98.3 F (36.8 C)   Ht 4' 8.5" (1.435 m)   Wt 139 lb 8 oz (63.3 kg)   SpO2 98%   BMI 30.72 kg/m   Wt Readings from Last 3 Encounters:  02/28/19 139 lb 8 oz (63.3 kg)  02/20/19 142 lb 9.6 oz (64.7 kg)  12/23/18 141 lb 3.2 oz (64 kg)    Physical Exam Vitals signs and nursing note reviewed.  Constitutional:      Appearance: She is well-developed.  HENT:     Head: Normocephalic.     Right Ear: Hearing, tympanic membrane, ear canal and external ear normal.     Left Ear: Hearing, tympanic membrane, ear canal and external ear normal.     Nose: Nose normal.     Right Sinus: No maxillary sinus tenderness or frontal sinus tenderness.     Left Sinus: No maxillary sinus tenderness or frontal sinus tenderness.     Mouth/Throat:     Mouth: Mucous membranes are moist. No lacerations or angioedema.     Pharynx: Oropharynx is clear. Uvula midline. No pharyngeal swelling, posterior oropharyngeal erythema or uvula swelling.     Tonsils: No tonsillar exudate.  Eyes:     General:  Right eye: No discharge.        Left eye: No discharge.     Conjunctiva/sclera: Conjunctivae normal.     Pupils: Pupils are equal, round, and reactive to light.  Neck:     Musculoskeletal: Normal range of motion and neck supple.     Thyroid: No thyromegaly.     Vascular: No carotid bruit or JVD.  Cardiovascular:     Rate and Rhythm: Normal rate and regular rhythm.     Heart sounds: Normal heart sounds.  Pulmonary:     Effort: Pulmonary effort is normal.     Breath sounds: Normal breath sounds.  Abdominal:     General: Bowel sounds are normal.     Palpations: Abdomen is soft.  Lymphadenopathy:     Cervical: No cervical adenopathy.  Skin:     General: Skin is warm and dry.     Capillary Refill: Capillary refill takes less than 2 seconds.  Neurological:     Mental Status: She is alert and oriented to person, place, and time.  Psychiatric:        Behavior: Behavior normal.        Thought Content: Thought content normal.        Judgment: Judgment normal.     Results for orders placed or performed in visit on 02/20/19  Microscopic Examination  Result Value Ref Range   WBC, UA 6-10 (A) 0 - 5 /hpf   RBC, UA None seen 0 - 2 /hpf   Epithelial Cells (non renal) 0-10 0 - 10 /hpf   Bacteria, UA Moderate (A) None seen/Few  Urine Culture, Reflex  Result Value Ref Range   Urine Culture, Routine Final report    Organism ID, Bacteria Comment   UA/M w/rflx Culture, Routine  Result Value Ref Range   Specific Gravity, UA 1.020 1.005 - 1.030   pH, UA 5.5 5.0 - 7.5   Color, UA Yellow Yellow   Appearance Ur Cloudy (A) Clear   Leukocytes, UA 3+ (A) Negative   Protein, UA Negative Negative/Trace   Glucose, UA Negative Negative   Ketones, UA Negative Negative   RBC, UA Negative Negative   Bilirubin, UA Negative Negative   Urobilinogen, Ur 0.2 0.2 - 1.0 mg/dL   Nitrite, UA Negative Negative   Microscopic Examination See below:    Urinalysis Reflex Comment       Assessment & Plan:   Problem List Items Addressed This Visit    None    Visit Diagnoses    Sore throat    -  Primary   Recommend Zyrtec or Claritin, salt water gargles, increased fluid intake, and throat lozenges. F/U if symptoms fail to improve or worsen.    Relevant Orders   Rapid Strep Screen ([facility] ONLY)       Follow up plan: Return in about 6 months (around 08/31/2019) for HTN.   NOTE WRITTEN BY [employer] DNP STUDENT.  ASSESSMENT AND PLAN OF CARE REVIEWED WITH STUDENT, AGREE WITH ABOVE FINDINGS AND PLAN.

## 2019-02-28 NOTE — Patient Instructions (Signed)
Sore Throat  When you have a sore throat, your throat may feel:  · Tender.  · Burning.  · Irritated.  · Scratchy.  · Painful when you swallow.  · Painful when you talk.  Many things can cause a sore throat, such as:  · An infection.  · Allergies.  · Dry air.  · Smoke or pollution.  · Radiation treatment.  · Gastroesophageal reflux disease (GERD).  · A tumor.  A sore throat can be the first sign of another sickness. It can happen with other problems, like:  · Coughing.  · Sneezing.  · Fever.  · Swelling in the neck.  Most sore throats go away without treatment.  Follow these instructions at home:         · Take over-the-counter medicines only as told by your doctor.  ? If your child has a sore throat, do not give your child aspirin.  · Drink enough fluids to keep your pee (urine) pale yellow.  · Rest when you feel you need to.  · To help with pain:  ? Sip warm liquids, such as broth, herbal tea, or warm water.  ? Eat or drink cold or frozen liquids, such as frozen ice pops.  ? Gargle with a salt-water mixture 3-4 times a day or as needed. To make a salt-water mixture, add ½-1 tsp (3-6 g) of salt to 1 cup (237 mL) of warm water. Mix it until you cannot see the salt anymore.  ? Suck on hard candy or throat lozenges.  ? Put a cool-mist humidifier in your bedroom at night.  ? Sit in the bathroom with the door closed for 5-10 minutes while you run hot water in the shower.  · Do not use any products that contain nicotine or tobacco, such as cigarettes, e-cigarettes, and chewing tobacco. If you need help quitting, ask your doctor.  · Wash your hands well and often with soap and water. If soap and water are not available, use hand sanitizer.  Contact a doctor if:  · You have a fever for more than 2-3 days.  · You keep having symptoms for more than 2-3 days.  · Your throat does not get better in 7 days.  · You have a fever and your symptoms suddenly get worse.  · Your child who is 3 months to 3 years old has a temperature of  102.2°F (39°C) or higher.  Get help right away if:  · You have trouble breathing.  · You cannot swallow fluids, soft foods, or your saliva.  · You have swelling in your throat or neck that gets worse.  · You keep feeling sick to your stomach (nauseous).  · You keep throwing up (vomiting).  Summary  · A sore throat is pain, burning, irritation, or scratchiness in the throat. Many things can cause a sore throat.  · Take over-the-counter medicines only as told by your doctor. Do not give your child aspirin.  · Drink plenty of fluids, and rest as needed.  · Contact a doctor if your symptoms get worse or your sore throat does not get better within 7 days.  This information is not intended to replace advice given to you by your health care provider. Make sure you discuss any questions you have with your health care provider.  Document Released: 09/15/2008 Document Revised: 05/09/2018 Document Reviewed: 05/09/2018  Elsevier Interactive Patient Education © 2019 Elsevier Inc.

## 2019-02-28 NOTE — Telephone Encounter (Signed)
Spoke to Kelly Poole via telephone about c/o sore throat.  She reports sore throat started after taking Macrobid for recent UTI, denies difficulty with breathing or SOB.  States occasional difficulty swallowing with sore throat.  She has finished her course of Macrobid, finished yesterday.  Denies any other viral symptoms.  Discussed that sore throat may be related to something other than recent abx, possible initial presentation for URI.  She would like to come in to be seen.  Scheduled appointment for this afternoon.

## 2019-02-28 NOTE — Telephone Encounter (Signed)
Copied from [place] [id number]. Topic: General - Other >> Feb 28, 2019 10:20 AM Yvette Rack wrote: Reason for CRM: Pt stated she is experiencing sore throat from the antibiotic that she was taking for the uti. Pt did not want to schedule an appt. Pt requests that a medication for sore throat be sent to [facility] [place] [facility] [place]. Cb# [phone]

## 2019-03-03 LAB — CULTURE, GROUP A STREP: Strep A Culture: NEGATIVE

## 2019-03-03 LAB — RAPID STREP SCREEN (MED CTR MEBANE ONLY): Strep Gp A Ag, IA W/Reflex: NEGATIVE

## 2019-03-22 ENCOUNTER — Ambulatory Visit: Payer: Self-pay | Admitting: *Deleted

## 2019-03-22 NOTE — Telephone Encounter (Signed)
Pt states she has had "Hot flashes" for 16 years. "Post menopausal, since 68 years old." States they have worsened past several months, keeping her awake at night. Denied any other symptoms. States they occur during day as well; duration 3-4 minutes. Pt questioning if there is any OTC med, supplements she can take to help alleviate these hot flashes. Has never been on hormones. Please advise: [phone]  Reason for Disposition . [1] Caller requesting NON-URGENT health information AND [2] PCP's office is the best resource  Answer Assessment - Initial Assessment Questions 1. REASON FOR CALL or QUESTION: "What is your reason for calling today?" or "How can I best help you?" or "What question do you have that I can help answer?"    OTC med for hot flashes  Protocols used: INFORMATION ONLY CALL-A-AH

## 2019-03-23 NOTE — Telephone Encounter (Signed)
Please see if patient can be added to schedule tomorrow for virtual or telephone visit.  Thanks.

## 2019-03-23 NOTE — Telephone Encounter (Signed)
Thank you.  Will speak with her tomorrow then.

## 2019-03-23 NOTE — Telephone Encounter (Signed)
Spoke with patient.  She would like a call back put her on the schedule for 4/3 @ 8:30 with Jolene  Thank you

## 2019-03-23 NOTE — Telephone Encounter (Signed)
Called pt no answer left voicemail.

## 2019-03-24 ENCOUNTER — Other Ambulatory Visit: Payer: Self-pay

## 2019-03-24 ENCOUNTER — Telehealth: Payer: Self-pay | Admitting: Nurse Practitioner

## 2019-03-24 ENCOUNTER — Ambulatory Visit (INDEPENDENT_AMBULATORY_CARE_PROVIDER_SITE_OTHER): Payer: Medicare HMO | Admitting: Nurse Practitioner

## 2019-03-24 ENCOUNTER — Encounter: Payer: Self-pay | Admitting: Nurse Practitioner

## 2019-03-24 DIAGNOSIS — N951 Menopausal and female climacteric states: Secondary | ICD-10-CM | POA: Diagnosis not present

## 2019-03-24 MED ORDER — CITALOPRAM HYDROBROMIDE 20 MG PO TABS: ORAL_TABLET | ORAL | 3 refills | Status: DC

## 2019-03-24 NOTE — Patient Instructions (Signed)
Menopause  Menopause is the normal time of life when menstrual periods stop completely. It is usually confirmed by 12 months without a menstrual period. The transition to menopause (perimenopause) most often happens between the ages of 45 and 55. During perimenopause, hormone levels change in your body, which can cause symptoms and affect your health. Menopause may increase your risk for:   Loss of bone (osteoporosis), which causes bone breaks (fractures).   Depression.   Hardening and narrowing of the arteries (atherosclerosis), which can cause heart attacks and strokes.  What are the causes?  This condition is usually caused by a natural change in hormone levels that happens as you get older. The condition may also be caused by surgery to remove both ovaries (bilateral oophorectomy).  What increases the risk?  This condition is more likely to start at an earlier age if you have certain medical conditions or treatments, including:   A tumor of the pituitary gland in the brain.   A disease that affects the ovaries and hormone production.   Radiation treatment for cancer.   Certain cancer treatments, such as chemotherapy or hormone (anti-estrogen) therapy.   Heavy smoking and excessive alcohol use.   Family history of early menopause.  This condition is also more likely to develop earlier in women who are very thin.  What are the signs or symptoms?  Symptoms of this condition include:   Hot flashes.   Irregular menstrual periods.   Night sweats.   Changes in feelings about sex. This could be a decrease in sex drive or an increased comfort around your sexuality.   Vaginal dryness and thinning of the vaginal walls. This may cause painful intercourse.   Dryness of the skin and development of wrinkles.   Headaches.   Problems sleeping (insomnia).   Mood swings or irritability.   Memory problems.   Weight gain.   Hair growth on the face and chest.   Bladder infections or problems with urinating.  How  is this diagnosed?  This condition is diagnosed based on your medical history, a physical exam, your age, your menstrual history, and your symptoms. Hormone tests may also be done.  How is this treated?  In some cases, no treatment is needed. You and your health care provider should make a decision together about whether treatment is necessary. Treatment will be based on your individual condition and preferences. Treatment for this condition focuses on managing symptoms. Treatment may include:   Menopausal hormone therapy (MHT).   Medicines to treat specific symptoms or complications.   Acupuncture.   Vitamin or herbal supplements.  Before starting treatment, make sure to let your health care provider know if you have a personal or family history of:   Heart disease.   Breast cancer.   Blood clots.   Diabetes.   Osteoporosis.  Follow these instructions at home:  Lifestyle   Do not use any products that contain nicotine or tobacco, such as cigarettes and e-cigarettes. If you need help quitting, ask your health care provider.   Get at least 30 minutes of physical activity on 5 or more days each week.   Avoid alcoholic and caffeinated beverages, as well as spicy foods. This may help prevent hot flashes.   Get 7-8 hours of sleep each night.   If you have hot flashes, try:  ? Dressing in layers.  ? Avoiding things that may trigger hot flashes, such as spicy food, warm places, or stress.  ? Taking slow, deep   breaths when a hot flash starts.  ? Keeping a fan in your home and office.   Find ways to manage stress, such as deep breathing, meditation, or journaling.   Consider going to group therapy with other women who are having menopause symptoms. Ask your health care provider about recommended group therapy meetings.  Eating and drinking   Eat a healthy, balanced diet that contains whole grains, lean protein, low-fat dairy, and plenty of fruits and vegetables.   Your health care provider may recommend  adding more soy to your diet. Foods that contain soy include tofu, tempeh, and soy milk.   Eat plenty of foods that contain calcium and vitamin D for bone health. Items that are rich in calcium include low-fat milk, yogurt, beans, almonds, sardines, broccoli, and kale.  Medicines   Take over-the-counter and prescription medicines only as told by your health care provider.   Talk with your health care provider before starting any herbal supplements. If prescribed, take vitamins and supplements as told by your health care provider. These may include:  ? Calcium. Women age 51 and older should get 1,200 mg (milligrams) of calcium every day.  ? Vitamin D. Women need 600-800 International Units of vitamin D each day.  ? Vitamins B12 and B6. Aim for 50 micrograms of B12 and 1.5 mg of B6 each day.  General instructions   Keep track of your menstrual periods, including:  ? When they occur.  ? How heavy they are and how long they last.  ? How much time passes between periods.   Keep track of your symptoms, noting when they start, how often you have them, and how long they last.   Use vaginal lubricants or moisturizers to help with vaginal dryness and improve comfort during sex.   Keep all follow-up visits as told by your health care provider. This is important. This includes any group therapy or counseling.  Contact a health care provider if:   You are still having menstrual periods after age 55.   You have pain during sex.   You have not had a period for 12 months and you develop vaginal bleeding.  Get help right away if:   You have:  ? Severe depression.  ? Excessive vaginal bleeding.  ? Pain when you urinate.  ? A fast or irregular heart beat (palpitations).  ? Severe headaches.  ? Abdomen (abdominal) pain or severe indigestion.   You fell and you think you have a broken bone.   You develop leg or chest pain.   You develop vision problems.   You feel a lump in your breast.  Summary   Menopause is the normal  time of life when menstrual periods stop completely. It is usually confirmed by 12 months without a menstrual period.   The transition to menopause (perimenopause) most often happens between the ages of 45 and 55.   Symptoms can be managed through medicines, lifestyle changes, and complementary therapies such as acupuncture.   Eat a balanced diet that is rich in nutrients to promote bone health and heart health and to manage symptoms during menopause.  This information is not intended to replace advice given to you by your health care provider. Make sure you discuss any questions you have with your health care provider.  Document Released: 02/27/2004 Document Revised: 01/09/2017 Document Reviewed: 01/09/2017  Elsevier Interactive Patient Education  2019 Elsevier Inc.

## 2019-03-24 NOTE — Telephone Encounter (Signed)
Copied from [place] [id number]. Topic: General - Inquiry >> Mar 24, 2019  2:13 PM Virl Axe D wrote: Reason for CRM: Pt called and asked if she could take Black cohosh that was discussed today with Jolene. She does not want to take what was prescribed for her hot flashes because she does not want to get nausea. She would also like to know how much of the Black cohosh to take. Please advise.

## 2019-03-24 NOTE — Assessment & Plan Note (Signed)
Ongoing.  Not candidate for hormone therapy d/t h/o breast CA.  Discussed alternate options.  At this time patient wishes to trial Citalopram.  Will start at 10 MG x one week and then increase to 20 MG.  Recommend wearing layers at home, avoiding spicy foods, alcohol, caffeine, keeping home cool, and drinking plenty of fluids + getting plenty of rest.  Return in 4 weeks for follow-up.

## 2019-03-24 NOTE — Progress Notes (Addendum)
Ht 4\' 8"  (1.422 m)   Wt 140 lb (63.5 kg)   BMI 31.39 kg/m    Subjective:    Patient ID: Kelly Poole, female    DOB: 1951-12-14, 68 y.o.   MRN: [id number]  HPI: Kelly Poole is a 68 y.o. female  Chief Complaint  Patient presents with  . Hot Flashes    Ongoing for 15 years. Patient states her hot flashes slow down and they come right back.     . This visit was completed via telephone due to the restrictions of the COVID-19 pandemic. All issues as above were discussed and addressed but no physical exam was performed. If it was felt that the patient should be evaluated in the office, they were directed there. The patient verbally consented to this visit. Patient was unable to complete an audio/visual visit due to Lack of equipment. Due to the catastrophic nature of the COVID-19 pandemic, this visit was done through audio contact only. . Location of the patient: home . Location of the provider: home . Those involved with this call:  . Provider: Marnee Guarneri, DNP, AGPCNP-C . CMA: Merilyn Baba, [place] . Front Desk/Registration: Don Perking  . Time spent on call: 15 minutes on the phone discussing health concerns   MENOPAUSAL SYMPTOMS Has had ongoing vasomotor symptoms for 15 years since her hysterectomy.  These symptoms fluctuate between mild to moderate.  Worse at this time with increased stressors.  Has history of breast cancer.  Discussed with her that hormone therapy is contraindicated in her case and discussed alternate options to include: behavioral elements (layered clothing, decrease caffeine and alcohol intake, increase hydration, decrease stress), herbal remedies (discussed these are not FDA approved, but some show benefit), and use of alternate prescriptions (such as SNRI).   Gravida/Para: 2/2 Duration: stable Symptom severity: mild Hot flashes: yes Night sweats: yes Sleep disturbances: yes Vaginal dryness: yes Dyspareunia:no Decreased libido: no  Emotional lability: no Stress incontinence: no Previous HRT/pharmacotherapy: no Hysterectomy: no GYN surgery: hysterectomy   Absolute Contraindications to Hormonal Therapy:     Undiagnosed vaginal bleeding: no    Breast cancer: yes    Endometrial cancer: no    Coronary disease: no    Cerebrovascular disease: no    Venous thromboembolic disease: no  Relevant past medical, surgical, family and social history reviewed and updated as indicated. Interim medical history since our last visit reviewed. Allergies and medications reviewed and updated.  Review of Systems  Constitutional: Negative for activity change, appetite change, diaphoresis, fatigue and fever.  Respiratory: Negative for cough, chest tightness and shortness of breath.   Cardiovascular: Negative for chest pain, palpitations and leg swelling.  Gastrointestinal: Negative for abdominal distention, abdominal pain, constipation, diarrhea, nausea and vomiting.  Endocrine: Negative for cold intolerance, heat intolerance, polydipsia, polyphagia and polyuria.  Genitourinary: Negative for dyspareunia, pelvic pain, vaginal discharge and vaginal pain.  Neurological: Negative for dizziness, syncope, weakness, light-headedness, numbness and headaches.  Psychiatric/Behavioral: Negative.     Per HPI unless specifically indicated above     Objective:    Ht 4\' 8"  (1.422 m)   Wt 140 lb (63.5 kg)   BMI 31.39 kg/m   Wt Readings from Last 3 Encounters:  03/24/19 140 lb (63.5 kg)  02/28/19 139 lb 8 oz (63.3 kg)  02/20/19 142 lb 9.6 oz (64.7 kg)    Physical Exam   Not performed, telephone only visit.  Results for orders placed or performed in visit on 02/28/19  Rapid Strep  Screen ([facility] ONLY)  Result Value Ref Range   Strep Gp A Ag, IA W/Reflex Negative Negative  Culture, Group A Strep  Result Value Ref Range   Strep A Culture Negative       Assessment & Plan:   Problem List Items Addressed This Visit      Other    Vasomotor symptoms due to menopause    Ongoing.  Not candidate for hormone therapy d/t h/o breast CA.  Discussed alternate options.  At this time patient wishes to trial Citalopram.  Will start at 10 MG x one week and then increase to 20 MG.  Recommend wearing layers at home, avoiding spicy foods, alcohol, caffeine, keeping home cool, and drinking plenty of fluids + getting plenty of rest.  Return in 4 weeks for follow-up.         I discussed the assessment and treatment plan with the patient. The patient was provided an opportunity to ask questions and all were answered. The patient agreed with the plan and demonstrated an understanding of the instructions.   The patient was advised to call back or seek an in-person evaluation if the symptoms worsen or if the condition fails to improve as anticipated.   I provided 15 minutes of time during this encounter.  Follow up plan: Return in about 4 weeks (around 04/21/2019) for Hot flashes.

## 2019-04-06 ENCOUNTER — Other Ambulatory Visit: Payer: Self-pay

## 2019-04-06 MED ORDER — LISINOPRIL 10 MG PO TABS: 10.0000 mg | ORAL_TABLET | Freq: Every day | ORAL | 2 refills | Status: DC

## 2019-04-06 NOTE — Telephone Encounter (Signed)
Medication Refill and 90 day supply request for Lisinopril

## 2019-04-25 ENCOUNTER — Ambulatory Visit (INDEPENDENT_AMBULATORY_CARE_PROVIDER_SITE_OTHER): Payer: Medicare HMO | Admitting: Nurse Practitioner

## 2019-04-25 ENCOUNTER — Encounter: Payer: Self-pay | Admitting: Nurse Practitioner

## 2019-04-25 ENCOUNTER — Other Ambulatory Visit: Payer: Self-pay

## 2019-04-25 DIAGNOSIS — N951 Menopausal and female climacteric states: Secondary | ICD-10-CM | POA: Diagnosis not present

## 2019-04-25 NOTE — Patient Instructions (Signed)
Menopause  Menopause is the normal time of life when menstrual periods stop completely. It is usually confirmed by 12 months without a menstrual period. The transition to menopause (perimenopause) most often happens between the ages of 45 and 55. During perimenopause, hormone levels change in your body, which can cause symptoms and affect your health. Menopause may increase your risk for:   Loss of bone (osteoporosis), which causes bone breaks (fractures).   Depression.   Hardening and narrowing of the arteries (atherosclerosis), which can cause heart attacks and strokes.  What are the causes?  This condition is usually caused by a natural change in hormone levels that happens as you get older. The condition may also be caused by surgery to remove both ovaries (bilateral oophorectomy).  What increases the risk?  This condition is more likely to start at an earlier age if you have certain medical conditions or treatments, including:   A tumor of the pituitary gland in the brain.   A disease that affects the ovaries and hormone production.   Radiation treatment for cancer.   Certain cancer treatments, such as chemotherapy or hormone (anti-estrogen) therapy.   Heavy smoking and excessive alcohol use.   Family history of early menopause.  This condition is also more likely to develop earlier in women who are very thin.  What are the signs or symptoms?  Symptoms of this condition include:   Hot flashes.   Irregular menstrual periods.   Night sweats.   Changes in feelings about sex. This could be a decrease in sex drive or an increased comfort around your sexuality.   Vaginal dryness and thinning of the vaginal walls. This may cause painful intercourse.   Dryness of the skin and development of wrinkles.   Headaches.   Problems sleeping (insomnia).   Mood swings or irritability.   Memory problems.   Weight gain.   Hair growth on the face and chest.   Bladder infections or problems with urinating.  How  is this diagnosed?  This condition is diagnosed based on your medical history, a physical exam, your age, your menstrual history, and your symptoms. Hormone tests may also be done.  How is this treated?  In some cases, no treatment is needed. You and your health care provider should make a decision together about whether treatment is necessary. Treatment will be based on your individual condition and preferences. Treatment for this condition focuses on managing symptoms. Treatment may include:   Menopausal hormone therapy (MHT).   Medicines to treat specific symptoms or complications.   Acupuncture.   Vitamin or herbal supplements.  Before starting treatment, make sure to let your health care provider know if you have a personal or family history of:   Heart disease.   Breast cancer.   Blood clots.   Diabetes.   Osteoporosis.  Follow these instructions at home:  Lifestyle   Do not use any products that contain nicotine or tobacco, such as cigarettes and e-cigarettes. If you need help quitting, ask your health care provider.   Get at least 30 minutes of physical activity on 5 or more days each week.   Avoid alcoholic and caffeinated beverages, as well as spicy foods. This may help prevent hot flashes.   Get 7-8 hours of sleep each night.   If you have hot flashes, try:  ? Dressing in layers.  ? Avoiding things that may trigger hot flashes, such as spicy food, warm places, or stress.  ? Taking slow, deep   breaths when a hot flash starts.  ? Keeping a fan in your home and office.   Find ways to manage stress, such as deep breathing, meditation, or journaling.   Consider going to group therapy with other women who are having menopause symptoms. Ask your health care provider about recommended group therapy meetings.  Eating and drinking   Eat a healthy, balanced diet that contains whole grains, lean protein, low-fat dairy, and plenty of fruits and vegetables.   Your health care provider may recommend  adding more soy to your diet. Foods that contain soy include tofu, tempeh, and soy milk.   Eat plenty of foods that contain calcium and vitamin D for bone health. Items that are rich in calcium include low-fat milk, yogurt, beans, almonds, sardines, broccoli, and kale.  Medicines   Take over-the-counter and prescription medicines only as told by your health care provider.   Talk with your health care provider before starting any herbal supplements. If prescribed, take vitamins and supplements as told by your health care provider. These may include:  ? Calcium. Women age 51 and older should get 1,200 mg (milligrams) of calcium every day.  ? Vitamin D. Women need 600-800 International Units of vitamin D each day.  ? Vitamins B12 and B6. Aim for 50 micrograms of B12 and 1.5 mg of B6 each day.  General instructions   Keep track of your menstrual periods, including:  ? When they occur.  ? How heavy they are and how long they last.  ? How much time passes between periods.   Keep track of your symptoms, noting when they start, how often you have them, and how long they last.   Use vaginal lubricants or moisturizers to help with vaginal dryness and improve comfort during sex.   Keep all follow-up visits as told by your health care provider. This is important. This includes any group therapy or counseling.  Contact a health care provider if:   You are still having menstrual periods after age 55.   You have pain during sex.   You have not had a period for 12 months and you develop vaginal bleeding.  Get help right away if:   You have:  ? Severe depression.  ? Excessive vaginal bleeding.  ? Pain when you urinate.  ? A fast or irregular heart beat (palpitations).  ? Severe headaches.  ? Abdomen (abdominal) pain or severe indigestion.   You fell and you think you have a broken bone.   You develop leg or chest pain.   You develop vision problems.   You feel a lump in your breast.  Summary   Menopause is the normal  time of life when menstrual periods stop completely. It is usually confirmed by 12 months without a menstrual period.   The transition to menopause (perimenopause) most often happens between the ages of 45 and 55.   Symptoms can be managed through medicines, lifestyle changes, and complementary therapies such as acupuncture.   Eat a balanced diet that is rich in nutrients to promote bone health and heart health and to manage symptoms during menopause.  This information is not intended to replace advice given to you by your health care provider. Make sure you discuss any questions you have with your health care provider.  Document Released: 02/27/2004 Document Revised: 01/09/2017 Document Reviewed: 01/09/2017  Elsevier Interactive Patient Education  2019 Elsevier Inc.

## 2019-04-25 NOTE — Assessment & Plan Note (Signed)
Chronic, improved with Citalopram.  Continue current medication.  HRT contraindicated due to h/o breast CA.

## 2019-04-25 NOTE — Progress Notes (Addendum)
Ht 4\' 8"  (1.422 m)   Wt 140 lb (63.5 kg)   BMI 31.39 kg/m    Subjective:    Patient ID: Kelly Poole, female    DOB: [birth year], 68 y.o.   MRN: [id number]  HPI: Kelly Poole is a 68 y.o. female  Chief Complaint  Patient presents with  . Hot Flashes    f/u    . This visit was completed via telephone due to the restrictions of the COVID-19 pandemic. All issues as above were discussed and addressed but no physical exam was performed. If it was felt that the patient should be evaluated in the office, they were directed there. The patient verbally consented to this visit. Patient was unable to complete an audio/visual visit due to Lack of equipment. Due to the catastrophic nature of the COVID-19 pandemic, this visit was done through audio contact only. . Location of the patient: home . Location of the provider: home . Those involved with this call:  . Provider: Marnee Guarneri, DNP . CMA: Lesle Chris, [place] . Front Desk/Registration: Linard Millers  . Time spent on call: 15 minutes on the phone discussing health concerns. 10 minutes total spent in review of patient's record and preparation of their chart. I verified patient identity using two factors (patient name and date of birth). Patient consents verbally to being seen via telemedicine visit today.   MENOPAUSAL SYMPTOMS Ongoing vasomotor symptoms for 15 years since her hysterectomy.  These symptoms fluctuate between mild to moderate.  Has history of breast cancer.  Discussed with her that hormone therapy is contraindicated in her case and discussed alternate options to include: she wished to trial Citalopram which was started 03/24/2019.  She reports hot flashes are less and she is sleeping a little better.  States she has noticed "much improvement" and is feeling overall better. Gravida/Para: 2/2 Duration: stable Symptom severity: mild Hot flashes: yes Night sweats: yes Sleep disturbances: yes Vaginal dryness: yes  Dyspareunia:no Decreased libido: no Emotional lability: no Stress incontinence: no Previous HRT/pharmacotherapy: no Hysterectomy: yes Average interval between menses:  Length of menses:  Flow: Dysmenorrhea:  GYN surgery:  Absolute Contraindications to Hormonal Therapy:     Undiagnosed vaginal bleeding: no    Breast cancer: yes    Endometrial cancer: no    Coronary disease: no    Cerebrovascular disease: no    Venous thromboembolic disease: no  Relevant past medical, surgical, family and social history reviewed and updated as indicated. Interim medical history since our last visit reviewed. Allergies and medications reviewed and updated.  Review of Systems  Constitutional: Negative for activity change, appetite change, diaphoresis, fatigue and fever.  Respiratory: Negative for cough, chest tightness and shortness of breath.   Cardiovascular: Negative for chest pain, palpitations and leg swelling.  Gastrointestinal: Negative for abdominal distention, abdominal pain, constipation, diarrhea, nausea and vomiting.  Neurological: Negative for dizziness, syncope, weakness, light-headedness, numbness and headaches.  Psychiatric/Behavioral: Negative.     Per HPI unless specifically indicated above     Objective:    Ht 4\' 8"  (1.422 m)   Wt 140 lb (63.5 kg)   BMI 31.39 kg/m   Wt Readings from Last 3 Encounters:  04/25/19 140 lb (63.5 kg)  03/24/19 140 lb (63.5 kg)  02/28/19 139 lb 8 oz (63.3 kg)    Physical Exam   Unable to obtain, no visual access.  Results for orders placed or performed in visit on 02/28/19  Rapid Strep Screen ([facility] ONLY)  Result Value Ref Range   Strep Gp A Ag, IA W/Reflex Negative Negative  Culture, Group A Strep  Result Value Ref Range   Strep A Culture Negative       Assessment & Plan:   Problem List Items Addressed This Visit      Other   Vasomotor symptoms due to menopause    Chronic, improved with Citalopram.  Continue current  medication.  HRT contraindicated due to h/o breast CA.        I discussed the assessment and treatment plan with the patient. The patient was provided an opportunity to ask questions and all were answered. The patient agreed with the plan and demonstrated an understanding of the instructions.   The patient was advised to call back or seek an in-person evaluation if the symptoms worsen or if the condition fails to improve as anticipated.   I provided 15 minutes of time during this encounter.   Follow up plan: Return in about 3 months (around 07/26/2019) for HTN, GERD, HLD, Menopause.

## 2019-06-30 ENCOUNTER — Other Ambulatory Visit: Payer: Self-pay

## 2019-06-30 ENCOUNTER — Ambulatory Visit (INDEPENDENT_AMBULATORY_CARE_PROVIDER_SITE_OTHER): Payer: Medicare HMO | Admitting: Nurse Practitioner

## 2019-06-30 ENCOUNTER — Encounter: Payer: Self-pay | Admitting: Nurse Practitioner

## 2019-06-30 VITALS — Temp 97.5°F | Wt 140.0 lb

## 2019-06-30 DIAGNOSIS — N951 Menopausal and female climacteric states: Secondary | ICD-10-CM

## 2019-06-30 DIAGNOSIS — I10 Essential (primary) hypertension: Secondary | ICD-10-CM

## 2019-06-30 DIAGNOSIS — E7800 Pure hypercholesterolemia, unspecified: Secondary | ICD-10-CM | POA: Diagnosis not present

## 2019-06-30 NOTE — Progress Notes (Signed)
Temp (!) 97.5 F (36.4 C) (Oral)   Wt 140 lb (63.5 kg)   BMI 31.39 kg/m    Subjective:    Patient ID: Kelly Poole, female    DOB: March 30, 1951, 68 y.o.   MRN: [id number]  HPI: Kelly Poole is a 68 y.o. female  Chief Complaint  Patient presents with  . Hyperlipidemia  . Hypertension  . Hot Flashes    pt states she is still having night sweats and hot flashes    . This visit was completed via telephone due to the restrictions of the COVID-19 pandemic. All issues as above were discussed and addressed but no physical exam was performed. If it was felt that the patient should be evaluated in the office, they were directed there. The patient verbally consented to this visit. Patient was unable to complete an audio/visual visit due to Lack of equipment. Due to the catastrophic nature of the COVID-19 pandemic, this visit was done through audio contact only. . Location of the patient: home . Location of the provider: home . Those involved with this call:  . Provider: Marnee Guarneri, DNP . CMA: Merilyn Baba, CMA . Front Desk/Registration: Jill Side  . Time spent on call: 15 minutes on the phone discussing health concerns. 5 minutes total spent in review of patient's record and preparation of their chart.  . I verified patient identity using two factors (patient name and date of birth). Patient consents verbally to being seen via telemedicine visit today.    HYPERTENSION / HYPERLIPIDEMIA Currently continues on Lisinopril 10 MG and Atorvastatin 10 MG. Satisfied with current treatment? yes Duration of hypertension: chronic BP monitoring frequency: not checking, has not checked past couple months BP range:  BP medication side effects: no Duration of hyperlipidemia: chronic Cholesterol medication side effects: no Cholesterol supplements: none Medication compliance: good compliance Aspirin: yes Recent stressors: no Recurrent headaches: no Visual changes: no Palpitations:  no Dyspnea: no Chest pain: no Lower extremity edema: no Dizzy/lightheaded: no   MENOPAUSAL SYMPTOMS Continues to have hot flashes at night.  Currently on Citalopram 20 MG daily, which she reported at last visit improved symptoms.  Has history of breast cancer.  Reports she stopped taking Citalopram a week ago and is taking black Cohosh, discussed risks with this herbal supplement with patient.  She wishes to continue to take at this time vs taking alternative medication like Gabapentin.   Duration: uncontrolled Symptom severity: mild Hot flashes: yes Night sweats: yes Sleep disturbances: sometimes Vaginal dryness: no Dyspareunia:no Decreased libido: no Emotional lability: no Stress incontinence: no Previous HRT/pharmacotherapy: no Hysterectomy: yes Absolute Contraindications to Hormonal Therapy:     Undiagnosed vaginal bleeding: no    Breast cancer: yes    Endometrial cancer: no    Coronary disease: no    Cerebrovascular disease: no    Venous thromboembolic disease: no  Relevant past medical, surgical, family and social history reviewed and updated as indicated. Interim medical history since our last visit reviewed. Allergies and medications reviewed and updated.  Review of Systems  Constitutional: Negative for activity change, appetite change, diaphoresis, fatigue and fever.  Respiratory: Negative for cough, chest tightness and shortness of breath.   Cardiovascular: Negative for chest pain, palpitations and leg swelling.  Gastrointestinal: Negative for abdominal distention, abdominal pain, constipation, diarrhea, nausea and vomiting.  Neurological: Negative for dizziness, syncope, weakness, light-headedness, numbness and headaches.  Psychiatric/Behavioral: Negative.     Per HPI unless specifically indicated above     Objective:  Temp (!) 97.5 F (36.4 C) (Oral)   Wt 140 lb (63.5 kg)   BMI 31.39 kg/m   Wt Readings from Last 3 Encounters:  06/30/19 140 lb (63.5 kg)   04/25/19 140 lb (63.5 kg)  03/24/19 140 lb (63.5 kg)    Physical Exam   Unable to perform due to telephone visit only  Results for orders placed or performed in visit on 02/28/19  Rapid Strep Screen ([facility] ONLY)   Specimen: Other   OTHER  Result Value Ref Range   Strep Gp A Ag, IA W/Reflex Negative Negative  Culture, Group A Strep   OTHER  Result Value Ref Range   Strep A Culture Negative       Assessment & Plan:   Problem List Items Addressed This Visit      Cardiovascular and Mediastinum   Hypertension - Primary    Chronic, ongoing.  Continue current medication regimen, Lisinopril.  Labs at next visit.        Other   Hyperlipidemia    Chronic, ongoing.  Continue current medication regimen and check labs at next visit.      Vasomotor symptoms due to menopause    Ongoing, stopped taking Citalopram due to no ongoing benefit.  Is taking black Cohosh, discussed risks with this herbal supplement with patient.  She wishes to continue to take at this time vs taking alternative medication like Gabapentin.  Will follow up next visit and further discuss.  Consider 300 MG TID Gabapentin at next visit.         I discussed the assessment and treatment plan with the patient. The patient was provided an opportunity to ask questions and all were answered. The patient agreed with the plan and demonstrated an understanding of the instructions.   The patient was advised to call back or seek an in-person evaluation if the symptoms worsen or if the condition fails to improve as anticipated.   I provided 15 minutes of time during this encounter.  Follow up plan: Return in about 8 weeks (around 08/25/2019) for HTN/HLD/Anemia and menopause (in person with labs).

## 2019-06-30 NOTE — Assessment & Plan Note (Signed)
Ongoing, stopped taking Citalopram due to no ongoing benefit.  Is taking black Cohosh, discussed risks with this herbal supplement with patient.  She wishes to continue to take at this time vs taking alternative medication like Gabapentin.  Will follow up next visit and further discuss.  Consider 300 MG TID Gabapentin at next visit.

## 2019-06-30 NOTE — Patient Instructions (Signed)
Menopause Menopause is the normal time of life when menstrual periods stop completely. It is usually confirmed by 12 months without a menstrual period. The transition to menopause (perimenopause) most often happens between the ages of 45 and 55. During perimenopause, hormone levels change in your body, which can cause symptoms and affect your health. Menopause may increase your risk for:  Loss of bone (osteoporosis), which causes bone breaks (fractures).  Depression.  Hardening and narrowing of the arteries (atherosclerosis), which can cause heart attacks and strokes. What are the causes? This condition is usually caused by a natural change in hormone levels that happens as you get older. The condition may also be caused by surgery to remove both ovaries (bilateral oophorectomy). What increases the risk? This condition is more likely to start at an earlier age if you have certain medical conditions or treatments, including:  A tumor of the pituitary gland in the brain.  A disease that affects the ovaries and hormone production.  Radiation treatment for cancer.  Certain cancer treatments, such as chemotherapy or hormone (anti-estrogen) therapy.  Heavy smoking and excessive alcohol use.  Family history of early menopause. This condition is also more likely to develop earlier in women who are very thin. What are the signs or symptoms? Symptoms of this condition include:  Hot flashes.  Irregular menstrual periods.  Night sweats.  Changes in feelings about sex. This could be a decrease in sex drive or an increased comfort around your sexuality.  Vaginal dryness and thinning of the vaginal walls. This may cause painful intercourse.  Dryness of the skin and development of wrinkles.  Headaches.  Problems sleeping (insomnia).  Mood swings or irritability.  Memory problems.  Weight gain.  Hair growth on the face and chest.  Bladder infections or problems with urinating. How  is this diagnosed? This condition is diagnosed based on your medical history, a physical exam, your age, your menstrual history, and your symptoms. Hormone tests may also be done. How is this treated? In some cases, no treatment is needed. You and your health care provider should make a decision together about whether treatment is necessary. Treatment will be based on your individual condition and preferences. Treatment for this condition focuses on managing symptoms. Treatment may include:  Menopausal hormone therapy (MHT).  Medicines to treat specific symptoms or complications.  Acupuncture.  Vitamin or herbal supplements. Before starting treatment, make sure to let your health care provider know if you have a personal or family history of:  Heart disease.  Breast cancer.  Blood clots.  Diabetes.  Osteoporosis. Follow these instructions at home: Lifestyle  Do not use any products that contain nicotine or tobacco, such as cigarettes and e-cigarettes. If you need help quitting, ask your health care provider.  Get at least 30 minutes of physical activity on 5 or more days each week.  Avoid alcoholic and caffeinated beverages, as well as spicy foods. This may help prevent hot flashes.  Get 7-8 hours of sleep each night.  If you have hot flashes, try: ? Dressing in layers. ? Avoiding things that may trigger hot flashes, such as spicy food, warm places, or stress. ? Taking slow, deep breaths when a hot flash starts. ? Keeping a fan in your home and office.  Find ways to manage stress, such as deep breathing, meditation, or journaling.  Consider going to group therapy with other women who are having menopause symptoms. Ask your health care provider about recommended group therapy meetings. Eating and   drinking  Eat a healthy, balanced diet that contains whole grains, lean protein, low-fat dairy, and plenty of fruits and vegetables.  Your health care provider may recommend  adding more soy to your diet. Foods that contain soy include tofu, tempeh, and soy milk.  Eat plenty of foods that contain calcium and vitamin D for bone health. Items that are rich in calcium include low-fat milk, yogurt, beans, almonds, sardines, broccoli, and kale. Medicines  Take over-the-counter and prescription medicines only as told by your health care provider.  Talk with your health care provider before starting any herbal supplements. If prescribed, take vitamins and supplements as told by your health care provider. These may include: ? Calcium. Women age 51 and older should get 1,200 mg (milligrams) of calcium every day. ? Vitamin D. Women need 600-800 International Units of vitamin D each day. ? Vitamins B12 and B6. Aim for 50 micrograms of B12 and 1.5 mg of B6 each day. General instructions  Keep track of your menstrual periods, including: ? When they occur. ? How heavy they are and how long they last. ? How much time passes between periods.  Keep track of your symptoms, noting when they start, how often you have them, and how long they last.  Use vaginal lubricants or moisturizers to help with vaginal dryness and improve comfort during sex.  Keep all follow-up visits as told by your health care provider. This is important. This includes any group therapy or counseling. Contact a health care provider if:  You are still having menstrual periods after age 55.  You have pain during sex.  You have not had a period for 12 months and you develop vaginal bleeding. Get help right away if:  You have: ? Severe depression. ? Excessive vaginal bleeding. ? Pain when you urinate. ? A fast or irregular heart beat (palpitations). ? Severe headaches. ? Abdomen (abdominal) pain or severe indigestion.  You fell and you think you have a broken bone.  You develop leg or chest pain.  You develop vision problems.  You feel a lump in your breast. Summary  Menopause is the normal  time of life when menstrual periods stop completely. It is usually confirmed by 12 months without a menstrual period.  The transition to menopause (perimenopause) most often happens between the ages of 45 and 55.  Symptoms can be managed through medicines, lifestyle changes, and complementary therapies such as acupuncture.  Eat a balanced diet that is rich in nutrients to promote bone health and heart health and to manage symptoms during menopause. This information is not intended to replace advice given to you by your health care provider. Make sure you discuss any questions you have with your health care provider. Document Released: 02/27/2004 Document Revised: 11/19/2017 Document Reviewed: 01/09/2017 Elsevier Patient Education  2020 [employer] Inc.  

## 2019-06-30 NOTE — Assessment & Plan Note (Signed)
Chronic, ongoing.  Continue current medication regimen and check labs at next visit.

## 2019-06-30 NOTE — Assessment & Plan Note (Signed)
Chronic, ongoing.  Continue current medication regimen, Lisinopril.  Labs at next visit.

## 2019-07-13 ENCOUNTER — Telehealth: Payer: Self-pay | Admitting: Nurse Practitioner

## 2019-07-13 DIAGNOSIS — R300 Dysuria: Secondary | ICD-10-CM

## 2019-07-13 NOTE — Telephone Encounter (Signed)
Pt scheduled  

## 2019-07-13 NOTE — Telephone Encounter (Signed)
Copied from [place]. Topic: Appointment Scheduling - Scheduling Inquiry for Clinic >> Jul 13, 2019  9:26 AM Virl Axe D wrote: Reason for CRM: Pt called to request appt for UTI symptoms. No answer on FC line. Please return call >> Jul 13, 2019 11:23 AM Lennox Solders wrote: Pt would like abx. Pt is aware she may need to be seen.

## 2019-07-14 ENCOUNTER — Other Ambulatory Visit: Payer: Self-pay

## 2019-07-14 ENCOUNTER — Ambulatory Visit (INDEPENDENT_AMBULATORY_CARE_PROVIDER_SITE_OTHER): Payer: Medicare HMO | Admitting: Nurse Practitioner

## 2019-07-14 ENCOUNTER — Other Ambulatory Visit: Payer: Medicare HMO

## 2019-07-14 ENCOUNTER — Encounter: Payer: Self-pay | Admitting: Nurse Practitioner

## 2019-07-14 DIAGNOSIS — R300 Dysuria: Secondary | ICD-10-CM

## 2019-07-14 MED ORDER — CIPROFLOXACIN HCL 500 MG PO TABS: 500.0000 mg | ORAL_TABLET | Freq: Every day | ORAL | 0 refills | Status: AC

## 2019-07-14 NOTE — Patient Instructions (Signed)
Urinary Tract Infection, Adult A urinary tract infection (UTI) is an infection of any part of the urinary tract. The urinary tract includes:  The kidneys.  The ureters.  The bladder.  The urethra. These organs make, store, and get rid of pee (urine) in the body. What are the causes? This is caused by germs (bacteria) in your genital area. These germs grow and cause swelling (inflammation) of your urinary tract. What increases the risk? You are more likely to develop this condition if:  You have a small, thin tube (catheter) to drain pee.  You cannot control when you pee or poop (incontinence).  You are female, and: ? You use these methods to prevent pregnancy: ? A medicine that kills sperm (spermicide). ? A device that blocks sperm (diaphragm). ? You have low levels of a female hormone (estrogen). ? You are pregnant.  You have genes that add to your risk.  You are sexually active.  You take antibiotic medicines.  You have trouble peeing because of: ? A prostate that is bigger than normal, if you are female. ? A blockage in the part of your body that drains pee from the bladder (urethra). ? A kidney stone. ? A nerve condition that affects your bladder (neurogenic bladder). ? Not getting enough to drink. ? Not peeing often enough.  You have other conditions, such as: ? Diabetes. ? A weak disease-fighting system (immune system). ? Sickle cell disease. ? Gout. ? Injury of the spine. What are the signs or symptoms? Symptoms of this condition include:  Needing to pee right away (urgently).  Peeing often.  Peeing small amounts often.  Pain or burning when peeing.  Blood in the pee.  Pee that smells bad or not like normal.  Trouble peeing.  Pee that is cloudy.  Fluid coming from the vagina, if you are female.  Pain in the belly or lower back. Other symptoms include:  Throwing up (vomiting).  No urge to eat.  Feeling mixed up (confused).  Being tired  and grouchy (irritable).  A fever.  Watery poop (diarrhea). How is this treated? This condition may be treated with:  Antibiotic medicine.  Other medicines.  Drinking enough water. Follow these instructions at home:  Medicines  Take over-the-counter and prescription medicines only as told by your doctor.  If you were prescribed an antibiotic medicine, take it as told by your doctor. Do not stop taking it even if you start to feel better. General instructions  Make sure you: ? Pee until your bladder is empty. ? Do not hold pee for a long time. ? Empty your bladder after sex. ? Wipe from front to back after pooping if you are a female. Use each tissue one time when you wipe.  Drink enough fluid to keep your pee pale yellow.  Keep all follow-up visits as told by your doctor. This is important. Contact a doctor if:  You do not get better after 1-2 days.  Your symptoms go away and then come back. Get help right away if:  You have very bad back pain.  You have very bad pain in your lower belly.  You have a fever.  You are sick to your stomach (nauseous).  You are throwing up. Summary  A urinary tract infection (UTI) is an infection of any part of the urinary tract.  This condition is caused by germs in your genital area.  There are many risk factors for a UTI. These include having a small, thin   tube to drain pee and not being able to control when you pee or poop.  Treatment includes antibiotic medicines for germs.  Drink enough fluid to keep your pee pale yellow. This information is not intended to replace advice given to you by your health care provider. Make sure you discuss any questions you have with your health care provider. Document Released: 05/25/2008 Document Revised: 11/24/2018 Document Reviewed: 06/16/2018 Elsevier Patient Education  2020 [employer] Inc.  

## 2019-07-14 NOTE — Assessment & Plan Note (Signed)
Acute with UA noting 3+ leuks, bacteria, and WBC.  Script for Cipro sent.  Urine sent for culture and will adjust medication regimen as needed based on findings.  Recommend continued used of Vitamin C daily, increased fluids, and good hygiene techniques.  May take OTC pyridium for discomfort as needed.  Return to office for worsening or continued symptoms.

## 2019-07-14 NOTE — Progress Notes (Signed)
Ht 4\' 8"  (1.422 m)   Wt 140 lb (63.5 kg)   BMI 31.39 kg/m    Subjective:    Patient ID: Kelly Poole, female    DOB: 25-Nov-1951, 69 y.o.   MRN: [id number]  HPI: Kelly Poole is a 68 y.o. female  Chief Complaint  Patient presents with  . Dysuria    Ongoing 2 days.     . This visit was completed via telephone due to the restrictions of the COVID-19 pandemic. All issues as above were discussed and addressed but no physical exam was performed. If it was felt that the patient should be evaluated in the office, they were directed there. The patient verbally consented to this visit. Patient was unable to complete an audio/visual visit due to Lack of equipment. Due to the catastrophic nature of the COVID-19 pandemic, this visit was done through audio contact only. . Location of the patient: home . Location of the provider: home . Those involved with this call:  . Provider: Marnee Guarneri, DNP . CMA: Yvonna Alanis, CMA . Front Desk/Registration: Jill Side  . Time spent on call: 15 minutes on the phone discussing health concerns. 10 minutes total spent in review of patient's record and preparation of their chart.  . I verified patient identity using two factors (patient name and date of birth). Patient consents verbally to being seen via telemedicine visit today.    URINARY SYMPTOMS Symptoms present x 2 days.  Previous UTI in March, treated with Macrobid but she had reaction to this. Dysuria: burning Urinary frequency: no Urgency: no Small volume voids: no Symptom severity: yes Urinary incontinence: no Foul odor: yes Hematuria: no Abdominal pain: no Back pain: no Suprapubic pain/pressure: no Flank pain: no Fever:  none Vomiting: no Relief with cranberry juice: not used Relief with pyridium: not used Status: table Previous urinary tract infection: yes Recurrent urinary tract infection: yes Sexual activity: practices safe sex History of sexually transmitted  disease: no Treatments attempted: Vitamin C tablet daily and increasing fluids   Relevant past medical, surgical, family and social history reviewed and updated as indicated. Interim medical history since our last visit reviewed. Allergies and medications reviewed and updated.  Review of Systems  Constitutional: Negative for activity change, appetite change, diaphoresis, fatigue and fever.  Respiratory: Negative for cough, chest tightness and shortness of breath.   Cardiovascular: Negative for chest pain, palpitations and leg swelling.  Gastrointestinal: Negative for abdominal distention, abdominal pain, constipation, diarrhea, nausea and vomiting.  Genitourinary: Positive for dysuria. Negative for decreased urine volume, flank pain, frequency, hematuria, urgency and vaginal discharge.  Psychiatric/Behavioral: Negative.     Per HPI unless specifically indicated above     Objective:    Ht 4\' 8"  (1.422 m)   Wt 140 lb (63.5 kg)   BMI 31.39 kg/m   Wt Readings from Last 3 Encounters:  07/14/19 140 lb (63.5 kg)  06/30/19 140 lb (63.5 kg)  04/25/19 140 lb (63.5 kg)    Physical Exam   Unable to perform due to telephone visit only.  Results for orders placed or performed in visit on 07/14/19  Microscopic Examination   URINE  Result Value Ref Range   WBC, UA 6-10 (A) 0 - 5 /hpf   RBC None seen 0 - 2 /hpf   Epithelial Cells (non renal) 0-10 0 - 10 /hpf   Bacteria, UA Few (A) None seen/Few  Urine Culture, Reflex   URINE  Result Value Ref Range  Urine Culture, Routine WILL FOLLOW   UA/M w/rflx Culture, Routine   Specimen: Urine   URINE  Result Value Ref Range   Specific Gravity, UA 1.015 1.005 - 1.030   pH, UA 6.0 5.0 - 7.5   Color, UA Yellow Yellow   Appearance Ur Clear Clear   Leukocytes,UA 3+ (A) Negative   Protein,UA Negative Negative/Trace   Glucose, UA Negative Negative   Ketones, UA Negative Negative   RBC, UA Negative Negative   Bilirubin, UA Negative Negative    Urobilinogen, Ur 0.2 0.2 - 1.0 mg/dL   Nitrite, UA Negative Negative   Microscopic Examination See below:    Urinalysis Reflex Comment       Assessment & Plan:   Problem List Items Addressed This Visit      Other   Dysuria    Acute with UA noting 3+ leuks, bacteria, and WBC.  Script for Cipro sent.  Urine sent for culture and will adjust medication regimen as needed based on findings.  Recommend continued used of Vitamin C daily, increased fluids, and good hygiene techniques.  May take OTC pyridium for discomfort as needed.  Return to office for worsening or continued symptoms.         I discussed the assessment and treatment plan with the patient. The patient was provided an opportunity to ask questions and all were answered. The patient agreed with the plan and demonstrated an understanding of the instructions.   The patient was advised to call back or seek an in-person evaluation if the symptoms worsen or if the condition fails to improve as anticipated.   I provided 15 minutes of time during this encounter.  Follow up plan: Return if symptoms worsen or fail to improve.

## 2019-07-16 LAB — UA/M W/RFLX CULTURE, ROUTINE
Bilirubin, UA: NEGATIVE
Glucose, UA: NEGATIVE
Ketones, UA: NEGATIVE
Nitrite, UA: NEGATIVE
Protein,UA: NEGATIVE
RBC, UA: NEGATIVE
Specific Gravity, UA: 1.015 (ref 1.005–1.030)
Urobilinogen, Ur: 0.2 mg/dL (ref 0.2–1.0)
pH, UA: 6 (ref 5.0–7.5)

## 2019-07-16 LAB — URINE CULTURE, REFLEX

## 2019-07-16 LAB — MICROSCOPIC EXAMINATION: RBC, Urine: NONE SEEN /hpf (ref 0–2)

## 2019-07-17 ENCOUNTER — Telehealth: Payer: Self-pay | Admitting: Nurse Practitioner

## 2019-07-17 MED ORDER — AMOXICILLIN-POT CLAVULANATE 500-125 MG PO TABS: 1.0000 | ORAL_TABLET | Freq: Two times a day (BID) | ORAL | 0 refills | Status: AC

## 2019-07-17 NOTE — Telephone Encounter (Signed)
Complete.  Spoke to patient on phone.

## 2019-07-17 NOTE — Telephone Encounter (Signed)
Patient is calling because cipro was prescribed last week for UTI.Patient states that the Cipro did not help and she is requesting the antibotic that she had in March to  Address the UTI.  Please advise  [place]. Thank you

## 2019-07-17 NOTE — Telephone Encounter (Signed)
Medication Refill - Medication:  nitrofurantoin, macrocrystal-monohydrate  Has the patient contacted their pharmacy? Yes. Advised to call office. Patient states she would like this prescription instead of the one previously prescribed on 7/24, as she is still having symptoms. Patient states she wants a call from PCP.   Preferred Pharmacy (with phone number or street name):  [facility] [place] [phone] (Phone) [phone] (Fax)   Agent: Please be advised that RX refills may take up to 3 business days. We ask that you follow-up with your pharmacy.

## 2019-07-17 NOTE — Telephone Encounter (Signed)
Routing to provider  

## 2019-07-17 NOTE — Telephone Encounter (Signed)
Spoke to patient on telephone and discussed that last time Macrobid caused her sore throat issues and do not wish to start this.  Will send in Augmentin instead, which she agrees with.

## 2019-08-01 ENCOUNTER — Telehealth: Payer: Self-pay | Admitting: Nurse Practitioner

## 2019-08-01 NOTE — Telephone Encounter (Signed)
Pt. Reports she just got over a UTI. Wants to know if she can drink green tea. Reports she continues with her water intake as well. Told pt. She could have green tea.

## 2019-08-03 ENCOUNTER — Other Ambulatory Visit: Payer: Self-pay | Admitting: Nurse Practitioner

## 2019-08-03 DIAGNOSIS — Z1231 Encounter for screening mammogram for malignant neoplasm of breast: Secondary | ICD-10-CM

## 2019-08-10 ENCOUNTER — Other Ambulatory Visit: Payer: Self-pay | Admitting: Family Medicine

## 2019-08-10 ENCOUNTER — Other Ambulatory Visit: Payer: Self-pay | Admitting: Nurse Practitioner

## 2019-08-14 DIAGNOSIS — Z20828 Contact with and (suspected) exposure to other viral communicable diseases: Secondary | ICD-10-CM | POA: Diagnosis not present

## 2019-08-24 ENCOUNTER — Other Ambulatory Visit: Payer: Self-pay

## 2019-08-24 ENCOUNTER — Ambulatory Visit (INDEPENDENT_AMBULATORY_CARE_PROVIDER_SITE_OTHER): Payer: Medicare HMO | Admitting: Family Medicine

## 2019-08-24 ENCOUNTER — Encounter: Payer: Self-pay | Admitting: Family Medicine

## 2019-08-24 VITALS — BP 132/80 | HR 65 | Temp 98.8°F | Ht <= 58 in | Wt 146.0 lb

## 2019-08-24 DIAGNOSIS — N951 Menopausal and female climacteric states: Secondary | ICD-10-CM | POA: Diagnosis not present

## 2019-08-24 DIAGNOSIS — S8001XA Contusion of right knee, initial encounter: Secondary | ICD-10-CM

## 2019-08-24 NOTE — Patient Instructions (Signed)
Can use certain anti-depressants, such as effexor, cymbalta, or paxil to help with symptoms of menopause such as hot flashes, night sweats, and mood swings

## 2019-08-24 NOTE — Progress Notes (Signed)
BP 132/80   Pulse 65   Temp 98.8 F (37.1 C) (Oral)   Ht 4\' 8"  (1.422 m)   Wt 146 lb (66.2 kg)   SpO2 100%   BMI 32.73 kg/m    Subjective:    Patient ID: Kelly Poole, female    DOB: May 20, 1951, 68 y.o.   MRN: [id number]  HPI: Kelly Poole is a 68 y.o. female  Chief Complaint  Patient presents with  . Knee Pain    pt states that she hit her right knee against the stering wheel 3-4 weeks ago   Patient presenting today for pain, bruising and firm knot on medial right knee after hitting it hard on the steering wheel about 3 weeks ago in that area. Has been using ice here and there with some relief. Pain is localized and not in actual knee joint. Patient reports good mobility and strength of extremity.  Also dealing with continued menopausal sxs including hot flashes, night sweats, low libido, and some vaginal dryness. Black cohosh helps some.   Relevant past medical, surgical, family and social history reviewed and updated as indicated. Interim medical history since our last visit reviewed. Allergies and medications reviewed and updated.  Review of Systems  Per HPI unless specifically indicated above     Objective:    BP 132/80   Pulse 65   Temp 98.8 F (37.1 C) (Oral)   Ht 4\' 8"  (1.422 m)   Wt 146 lb (66.2 kg)   SpO2 100%   BMI 32.73 kg/m   Wt Readings from Last 3 Encounters:  08/24/19 146 lb (66.2 kg)  07/14/19 140 lb (63.5 kg)  06/30/19 140 lb (63.5 kg)    Physical Exam Vitals signs and nursing note reviewed.  Constitutional:      Appearance: Normal appearance. She is not ill-appearing.  HENT:     Head: Atraumatic.  Eyes:     Extraocular Movements: Extraocular movements intact.     Conjunctiva/sclera: Conjunctivae normal.  Neck:     Musculoskeletal: Normal range of motion and neck supple.  Cardiovascular:     Rate and Rhythm: Normal rate and regular rhythm.     Heart sounds: Normal heart sounds.  Pulmonary:     Effort: Pulmonary effort is  normal.     Breath sounds: Normal breath sounds.  Musculoskeletal: Normal range of motion.     Comments: Localized bruising and firm knot palpable at point of impact medial right knee inferior to patella. TTP  Skin:    General: Skin is warm and dry.  Neurological:     Mental Status: She is alert and oriented to person, place, and time.  Psychiatric:        Mood and Affect: Mood normal.        Thought Content: Thought content normal.        Judgment: Judgment normal.     Results for orders placed or performed in visit on 07/14/19  Microscopic Examination   URINE  Result Value Ref Range   WBC, UA 6-10 (A) 0 - 5 /hpf   RBC None seen 0 - 2 /hpf   Epithelial Cells (non renal) 0-10 0 - 10 /hpf   Bacteria, UA Few (A) None seen/Few  Urine Culture, Reflex   URINE  Result Value Ref Range   Urine Culture, Routine Final report    Organism ID, Bacteria Comment   UA/M w/rflx Culture, Routine   Specimen: Urine   URINE  Result Value Ref Range  Specific Gravity, UA 1.015 1.005 - 1.030   pH, UA 6.0 5.0 - 7.5   Color, UA Yellow Yellow   Appearance Ur Clear Clear   Leukocytes,UA 3+ (A) Negative   Protein,UA Negative Negative/Trace   Glucose, UA Negative Negative   Ketones, UA Negative Negative   RBC, UA Negative Negative   Bilirubin, UA Negative Negative   Urobilinogen, Ur 0.2 0.2 - 1.0 mg/dL   Nitrite, UA Negative Negative   Microscopic Examination See below:    Urinalysis Reflex Comment       Assessment & Plan:   Problem List Items Addressed This Visit      Other   Vasomotor symptoms due to menopause    Discussed numerous options to help with her sxs, including SSRIs/SNRIs, gabapentin, clonidine, and estrogen cream for her dryness. Patient will consider these and continue the supplements for now       Other Visit Diagnoses    Contusion of right knee, initial encounter    -  Primary   Exam reassuring for contusion. Continue ice intermittently, OTC pain relievers prn. F/u if  not resolving    Greater than 25 minutes spent in direct care and counseling today with patient   Follow up plan: Return if symptoms worsen or fail to improve.

## 2019-08-29 NOTE — Assessment & Plan Note (Signed)
Discussed numerous options to help with her sxs, including SSRIs/SNRIs, gabapentin, clonidine, and estrogen cream for her dryness. Patient will consider these and continue the supplements for now

## 2019-08-31 ENCOUNTER — Ambulatory Visit (INDEPENDENT_AMBULATORY_CARE_PROVIDER_SITE_OTHER): Payer: Medicare HMO | Admitting: Nurse Practitioner

## 2019-08-31 ENCOUNTER — Encounter: Payer: Self-pay | Admitting: Nurse Practitioner

## 2019-08-31 ENCOUNTER — Other Ambulatory Visit: Payer: Self-pay

## 2019-08-31 ENCOUNTER — Telehealth: Payer: Self-pay | Admitting: Family Medicine

## 2019-08-31 DIAGNOSIS — D518 Other vitamin B12 deficiency anemias: Secondary | ICD-10-CM | POA: Diagnosis not present

## 2019-08-31 DIAGNOSIS — N951 Menopausal and female climacteric states: Secondary | ICD-10-CM | POA: Diagnosis not present

## 2019-08-31 NOTE — Patient Instructions (Signed)
Anemia  Anemia is a condition in which you do not have enough red blood cells or hemoglobin. Hemoglobin is a substance in red blood cells that carries oxygen. When you do not have enough red blood cells or hemoglobin (are anemic), your body cannot get enough oxygen and your organs may not work properly. As a result, you may feel very tired or have other problems. What are the causes? Common causes of anemia include:  Excessive bleeding. Anemia can be caused by excessive bleeding inside or outside the body, including bleeding from the intestine or from periods in women.  Poor nutrition.  Long-lasting (chronic) kidney, thyroid, and liver disease.  Bone marrow disorders.  Cancer and treatments for cancer.  HIV (human immunodeficiency virus) and AIDS (acquired immunodeficiency syndrome).  Treatments for HIV and AIDS.  Spleen problems.  Blood disorders.  Infections, medicines, and autoimmune disorders that destroy red blood cells. What are the signs or symptoms? Symptoms of this condition include:  Minor weakness.  Dizziness.  Headache.  Feeling heartbeats that are irregular or faster than normal (palpitations).  Shortness of breath, especially with exercise.  Paleness.  Cold sensitivity.  Indigestion.  Nausea.  Difficulty sleeping.  Difficulty concentrating. Symptoms may occur suddenly or develop slowly. If your anemia is mild, you may not have symptoms. How is this diagnosed? This condition is diagnosed based on:  Blood tests.  Your medical history.  A physical exam.  Bone marrow biopsy. Your health care provider may also check your stool (feces) for blood and may do additional testing to look for the cause of your bleeding. You may also have other tests, including:  Imaging tests, such as a CT scan or MRI.  Endoscopy.  Colonoscopy. How is this treated? Treatment for this condition depends on the cause. If you continue to lose a lot of blood, you may  need to be treated at a hospital. Treatment may include:  Taking supplements of iron, vitamin S31, or folic acid.  Taking a hormone medicine (erythropoietin) that can help to stimulate red blood cell growth.  Having a blood transfusion. This may be needed if you lose a lot of blood.  Making changes to your diet.  Having surgery to remove your spleen. Follow these instructions at home:  Take over-the-counter and prescription medicines only as told by your health care provider.  Take supplements only as told by your health care provider.  Follow any diet instructions that you were given.  Keep all follow-up visits as told by your health care provider. This is important. Contact a health care provider if:  You develop new bleeding anywhere in the body. Get help right away if:  You are very weak.  You are short of breath.  You have pain in your abdomen or chest.  You are dizzy or feel faint.  You have trouble concentrating.  You have bloody or black, tarry stools.  You vomit repeatedly or you vomit up blood. Summary  Anemia is a condition in which you do not have enough red blood cells or enough of a substance in your red blood cells that carries oxygen (hemoglobin).  Symptoms may occur suddenly or develop slowly.  If your anemia is mild, you may not have symptoms.  This condition is diagnosed with blood tests as well as a medical history and physical exam. Other tests may be needed.  Treatment for this condition depends on the cause of the anemia. This information is not intended to replace advice given to you by  your health care provider. Make sure you discuss any questions you have with your health care provider. Document Released: 01/14/2005 Document Revised: 11/19/2017 Document Reviewed: 01/08/2017 Elsevier Patient Education  2020 [employer].

## 2019-08-31 NOTE — Progress Notes (Signed)
There were no vitals taken for this visit.   Subjective:    Patient ID: Kelly Poole, female    DOB: [birth year], 68 y.o.   MRN: [id number]  HPI: Kelly Poole is a 68 y.o. female  Chief Complaint  Patient presents with  . Menopause  . Anemia    . This visit was completed via telephone due to the restrictions of the COVID-19 pandemic. All issues as above were discussed and addressed but no physical exam was performed. If it was felt that the patient should be evaluated in the office, they were directed there. The patient verbally consented to this visit. Patient was unable to complete an audio/visual visit due to Lack of equipment. Due to the catastrophic nature of the COVID-19 pandemic, this visit was done through audio contact only. . Location of the patient: home . Location of the provider: home . Those involved with this call:  . Provider: Marnee Guarneri, DNP . CMA: Yvonna Alanis, CMA . Front Desk/Registration: Don Perking  . Time spent on call: 15 minutes on the phone discussing health concerns. 10 minutes total spent in review of patient's record and preparation of their chart.  . I verified patient identity using two factors (patient name and date of birth). Patient consents verbally to being seen via telemedicine visit today.    MENOPAUSAL SYMPTOMS Hot flashes at HS, had trial of Citalopram but saw no benefit. Last visit had switched to taking [facility], discussed risks with this herbal supplement with patient.  She wishes to continue to take at this time vs taking alternative medications like Gabapentin, Clonidine, SNRI.  States that overall her hot flashes are not all the time and a bit better + she takes Melatonin to help her sleep.  Would prefer not to take prescription medication. Duration: uncontrolled Symptom severity: mild Hot flashes: yes Night sweats: yes Sleep disturbances: sometimes Vaginal dryness: no Dyspareunia:no Decreased libido: no  Emotional lability: no Stress incontinence: no Previous HRT/pharmacotherapy: no Hysterectomy: yes Absolute Contraindications to Hormonal Therapy:     Undiagnosed vaginal bleeding: no    Breast cancer: yes    Endometrial cancer: no    Coronary disease: no    Cerebrovascular disease: no    Venous thromboembolic disease: no  ANEMIA Takes daily B12 and iron.  Last levels B12 1535 and iron 36.  She reports continued use of supplements daily and overall feeling well.  Does not wish to come into office for visit or labs, due to Covid.  She wishes to wait until January and then obtain a physical.   Anemia status: stable Etiology of anemia: Duration of anemia treatment:  Compliance with treatment: good compliance Iron supplementation side effects: no Severity of anemia: mild Fatigue: no Decreased exercise tolerance: no  Dyspnea on exertion: no Palpitations: no Bleeding: no Pica: no  Relevant past medical, surgical, family and social history reviewed and updated as indicated. Interim medical history since our last visit reviewed. Allergies and medications reviewed and updated.  Review of Systems  Constitutional: Negative for activity change, appetite change, diaphoresis, fatigue and fever.  Respiratory: Negative for cough, chest tightness and shortness of breath.   Cardiovascular: Negative for chest pain, palpitations and leg swelling.  Gastrointestinal: Negative for abdominal distention, abdominal pain, constipation, diarrhea, nausea and vomiting.  Neurological: Negative for dizziness, syncope, weakness, light-headedness, numbness and headaches.  Psychiatric/Behavioral: Negative.     Per HPI unless specifically indicated above     Objective:    There were no  vitals taken for this visit.  Wt Readings from Last 3 Encounters:  08/24/19 146 lb (66.2 kg)  07/14/19 140 lb (63.5 kg)  06/30/19 140 lb (63.5 kg)    Physical Exam   Unable to perform due to telephone only visit.   Results for orders placed or performed in visit on 07/14/19  Microscopic Examination   URINE  Result Value Ref Range   WBC, UA 6-10 (A) 0 - 5 /hpf   RBC None seen 0 - 2 /hpf   Epithelial Cells (non renal) 0-10 0 - 10 /hpf   Bacteria, UA Few (A) None seen/Few  Urine Culture, Reflex   URINE  Result Value Ref Range   Urine Culture, Routine Final report    Organism ID, Bacteria Comment   UA/M w/rflx Culture, Routine   Specimen: Urine   URINE  Result Value Ref Range   Specific Gravity, UA 1.015 1.005 - 1.030   pH, UA 6.0 5.0 - 7.5   Color, UA Yellow Yellow   Appearance Ur Clear Clear   Leukocytes,UA 3+ (A) Negative   Protein,UA Negative Negative/Trace   Glucose, UA Negative Negative   Ketones, UA Negative Negative   RBC, UA Negative Negative   Bilirubin, UA Negative Negative   Urobilinogen, Ur 0.2 0.2 - 1.0 mg/dL   Nitrite, UA Negative Negative   Microscopic Examination See below:    Urinalysis Reflex Comment       Assessment & Plan:   Problem List Items Addressed This Visit      Other   Vitamin B12 deficiency (dietary) anemia    Chronic, stable.  Continue daily B12 supplement. Refuses labs or in office visit at this time due to Covid.  Will have return in 4 months for annual physical.      Vasomotor symptoms due to menopause    Ongoing, but some improvement reported.  Did trial of Citalopram with no benefit, she wishes to continue Black Cohosh at this time.  Have discussed at length alternative options that would be safe for her with her h/o breast CA.  Refuses labs or in office visit at this time due to Covid.  Will have return in 4 months for annual physical.        I discussed the assessment and treatment plan with the patient. The patient was provided an opportunity to ask questions and all were answered. The patient agreed with the plan and demonstrated an understanding of the instructions.   The patient was advised to call back or seek an in-person evaluation if  the symptoms worsen or if the condition fails to improve as anticipated.   I provided 15 minutes of time during this encounter.   Follow up plan: Return in about 4 months (around 12/31/2019) for Annual physical.

## 2019-08-31 NOTE — Assessment & Plan Note (Signed)
Ongoing, but some improvement reported.  Did trial of Citalopram with no benefit, she wishes to continue Black Cohosh at this time.  Have discussed at length alternative options that would be safe for her with her h/o breast CA.  Refuses labs or in office visit at this time due to Covid.  Will have return in 4 months for annual physical.

## 2019-08-31 NOTE — Assessment & Plan Note (Signed)
Chronic, stable.  Continue daily B12 supplement. Refuses labs or in office visit at this time due to Covid.  Will have return in 4 months for annual physical.

## 2019-08-31 NOTE — Telephone Encounter (Signed)
Discussed avoiding further icing, covering area with aquaphor and gauze in case blisters open up, and keeping area clean. F/u if worsening or not improving  Copied from [place] [id number]. Topic: General - Other >> Aug 31, 2019 12:25 PM Celene Kras A wrote: Reason for CRM: Pt called and is requesting to have PCP call her back and is requesting advice regarding pain that she is having in her knee. Please advise. >> Aug 31, 2019  2:26 PM Celene Kras A wrote: Pt called stating she believes she has freezer burn on her leg from the ice pack she put on it. Pt states she has welts and red bumps on her leg. Please advise.  >> Aug 31, 2019 12:56 PM Jill Side wrote: Pt was seen on 9/3 for this. Please advise if appt is needed.

## 2019-09-04 ENCOUNTER — Telehealth: Payer: Self-pay | Admitting: Nurse Practitioner

## 2019-09-04 NOTE — Telephone Encounter (Signed)
Appt scheduled

## 2019-09-04 NOTE — Telephone Encounter (Signed)
Copied from [place] [id number]. Topic: General - Other >> Sep 04, 2019 10:49 AM Pauline Good wrote: Reason for CRM: pt is still having problem with her right knee and want to have a xray >> Sep 04, 2019 11:15 AM Jill Side wrote: Please advise

## 2019-09-04 NOTE — Telephone Encounter (Signed)
Did not discuss at her recent visit, will request virtual visit for follow-up on this matter.

## 2019-09-05 ENCOUNTER — Other Ambulatory Visit: Payer: Self-pay | Admitting: Nurse Practitioner

## 2019-09-05 ENCOUNTER — Ambulatory Visit
Admission: RE | Admit: 2019-09-05 | Discharge: 2019-09-05 | Disposition: A | Discharge status: Home / Self Care | Payer: 59 | Source: Ambulatory Visit | Attending: Nurse Practitioner | Admitting: Nurse Practitioner

## 2019-09-05 DIAGNOSIS — Z1231 Encounter for screening mammogram for malignant neoplasm of breast: Secondary | ICD-10-CM | POA: Diagnosis not present

## 2019-09-05 IMAGING — MG MM DIGITAL SCREENING UNILAT*R* W/ TOMO W/ CAD
6 series · 6 of 18 positions shown · non-contrast
Comparison: Previous exam(s).

CLINICAL DATA: Screening.

EXAM:
DIGITAL SCREENING UNILATERAL RIGHT MAMMOGRAM WITH CAD AND TOMO

[R MLO synth-2D]
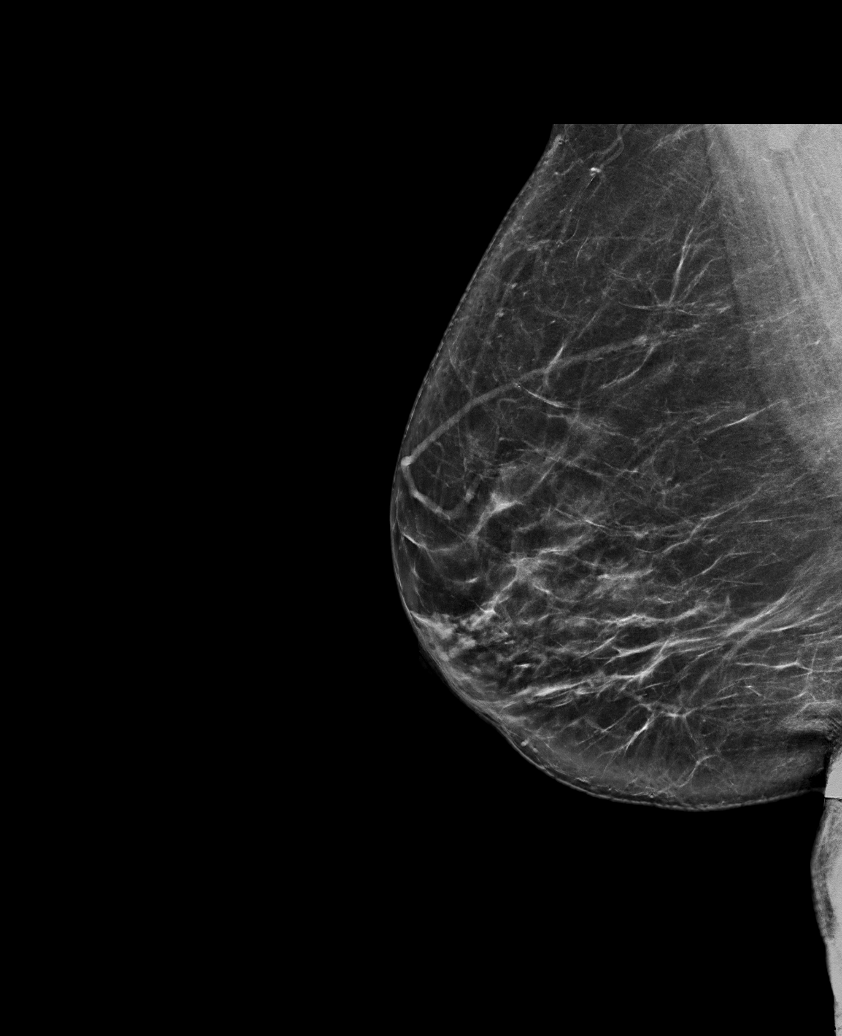

[R CC synth-2D (1 of 2)]
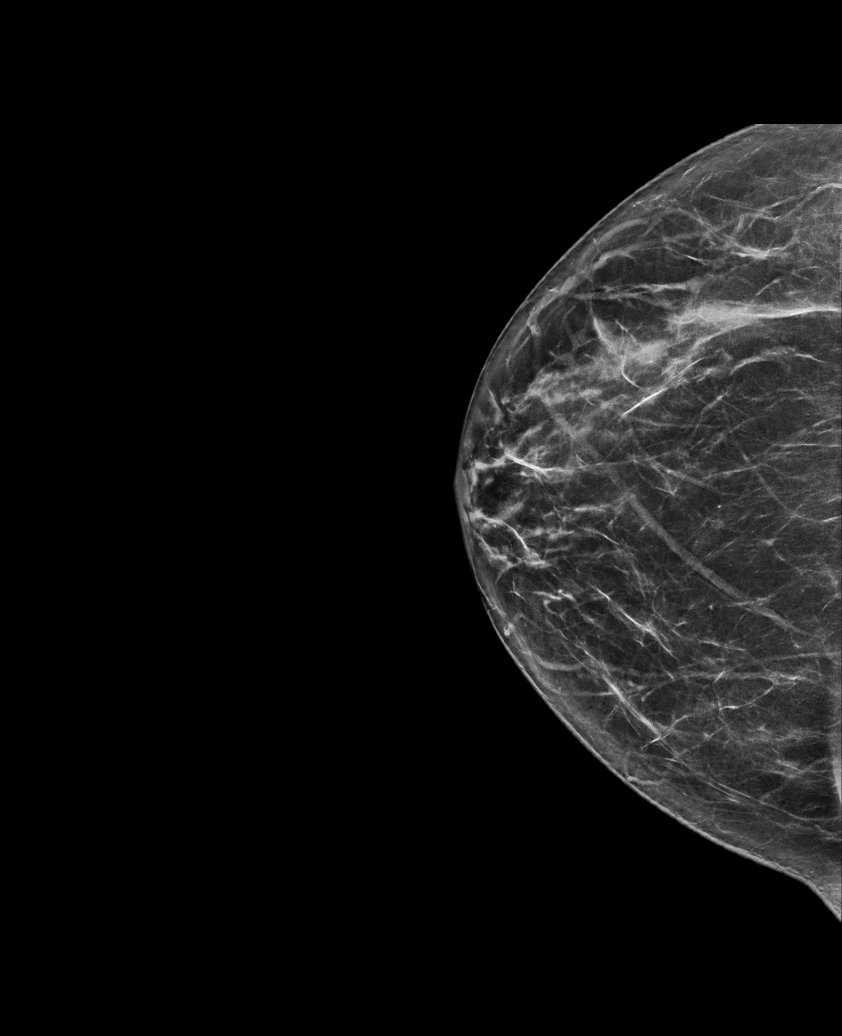

[R CC synth-2D (2 of 2)]
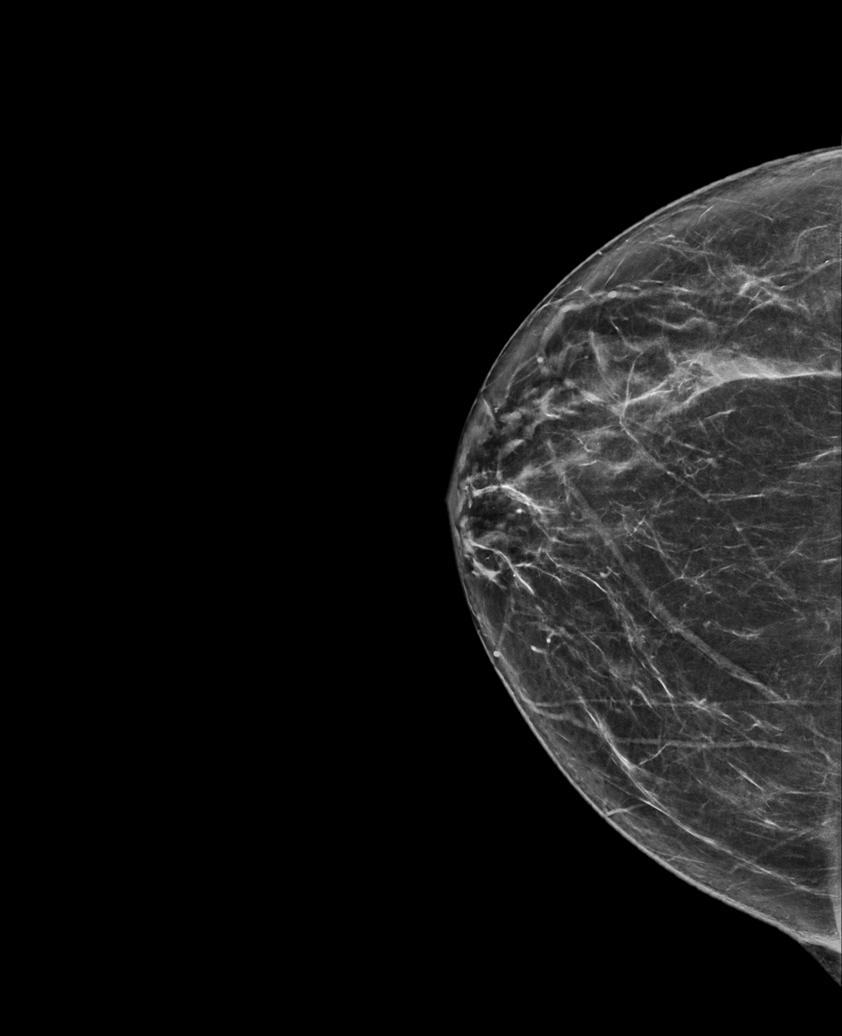

[R CC tomo (1 of 2) · tomo slice 37/72.0]
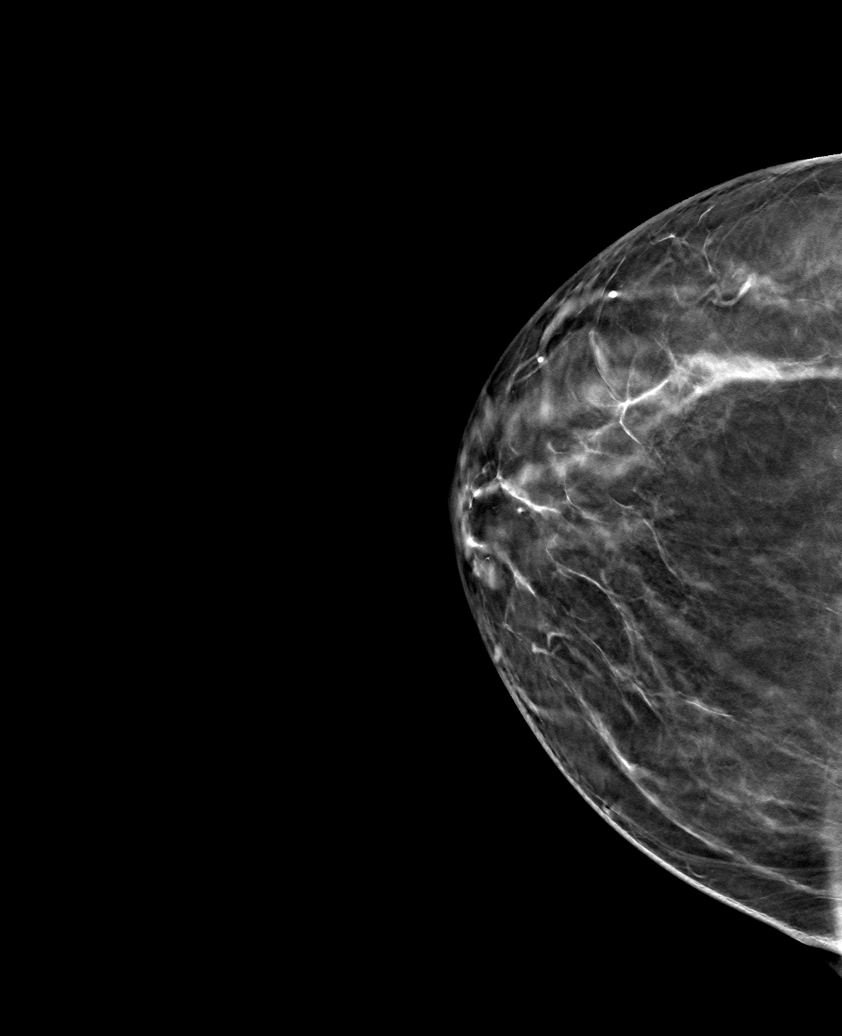

[R CC tomo (2 of 2) · tomo slice 37/72.0]
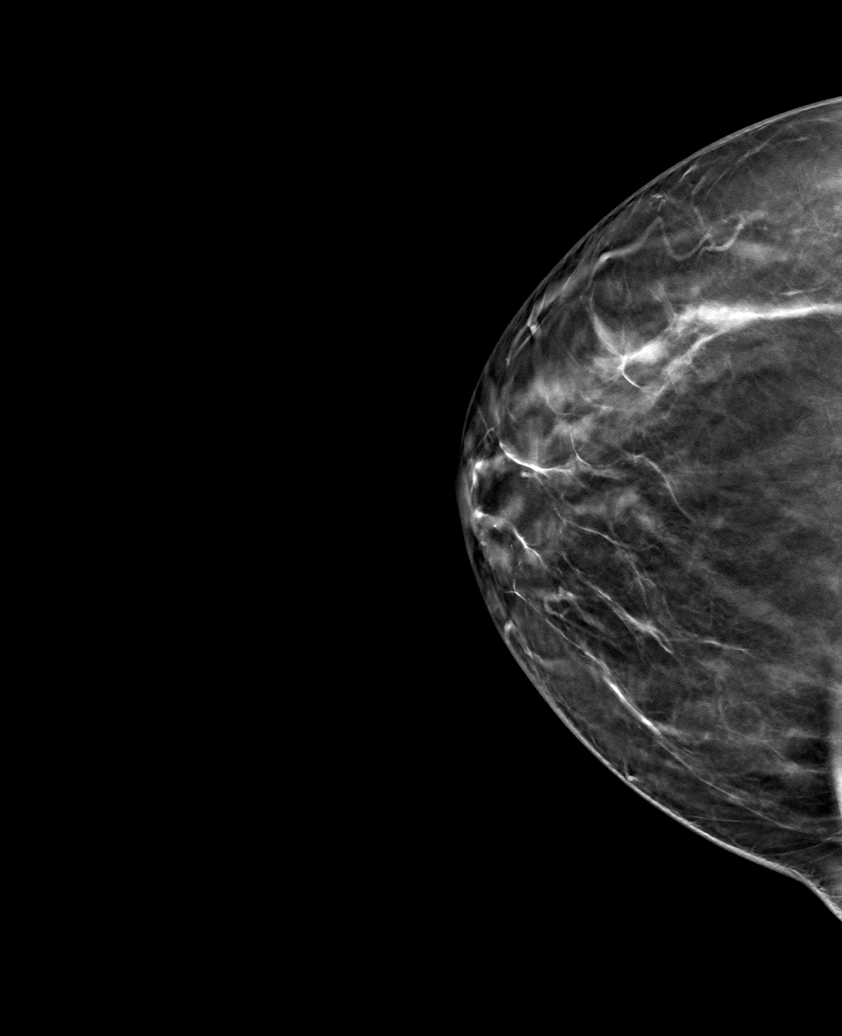

[R MLO tomo · tomo slice 42/83.0]
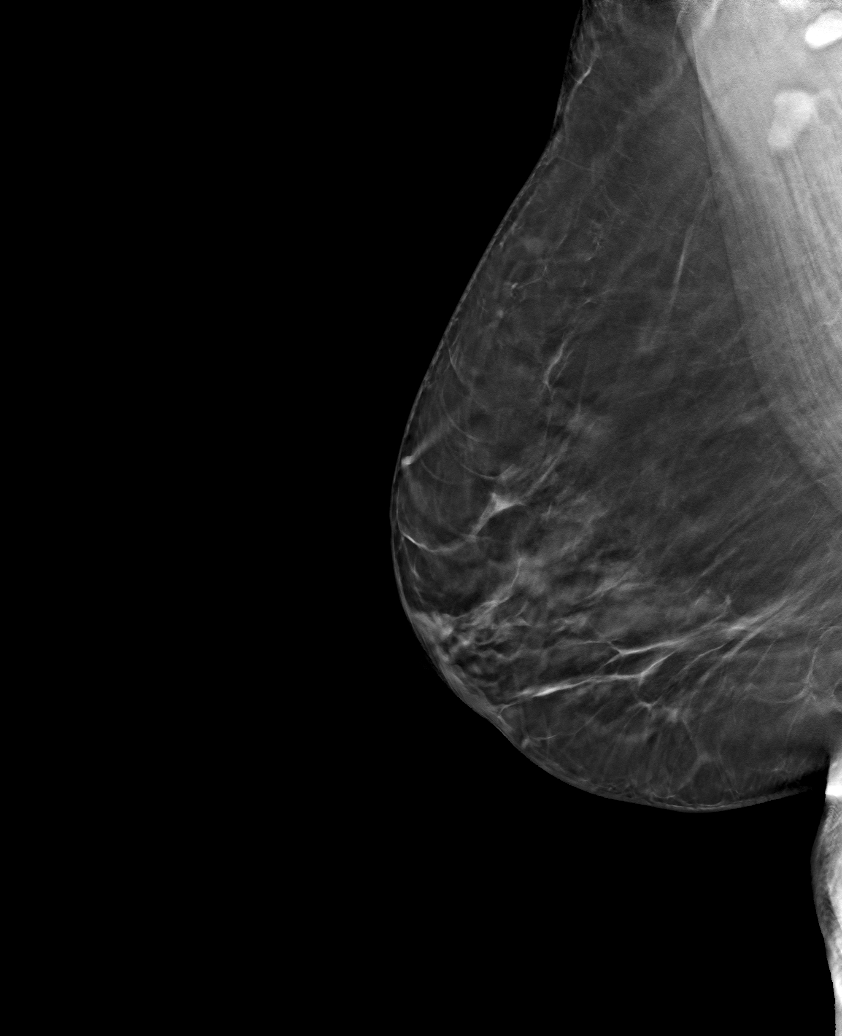

[6 of 18 positions shown; findings below may reference images not displayed]

ACR Breast Density Category b: There are scattered areas of
fibroglandular density.
FINDINGS: The patient has had a left mastectomy. There are no findings
suspicious for malignancy. Images were processed with CAD.
IMPRESSION: No mammographic evidence of malignancy. A result letter of this
screening mammogram will be mailed directly to the patient.

RECOMMENDATION:
Screening mammogram in one year.  (Code:[FD])

BI-RADS CATEGORY  1: Negative.

## 2019-09-07 ENCOUNTER — Other Ambulatory Visit: Payer: Self-pay

## 2019-09-07 ENCOUNTER — Encounter: Payer: Self-pay | Admitting: Nurse Practitioner

## 2019-09-07 ENCOUNTER — Ambulatory Visit (INDEPENDENT_AMBULATORY_CARE_PROVIDER_SITE_OTHER): Payer: Medicare HMO | Admitting: Nurse Practitioner

## 2019-09-07 DIAGNOSIS — M25561 Pain in right knee: Secondary | ICD-10-CM

## 2019-09-07 DIAGNOSIS — M1711 Unilateral primary osteoarthritis, right knee: Secondary | ICD-10-CM | POA: Insufficient documentation

## 2019-09-07 NOTE — Patient Instructions (Signed)

## 2019-09-07 NOTE — Assessment & Plan Note (Signed)
Suspect small contusion due to trauma, she reports this is improving.  Will defer imaging at this time and obtain if worsening symptoms.  Continue simple treatment at home and add on compression sleeve and Voltaren gel to knee as needed.  Return to office for worsening or continued issues.

## 2019-09-07 NOTE — Progress Notes (Signed)
There were no vitals taken for this visit.   Subjective:    Patient ID: Kelly Poole, female    DOB: June 30, 1951, 68 y.o.   MRN: [id number]  HPI: Kelly Poole is a 68 y.o. female  Chief Complaint  Patient presents with  . Knee Pain    pt states her R knee and upper leg has been hurting and swelling, pain comes and goes. States this has been going on for a few weeks     . This visit was completed via telephone due to the restrictions of the COVID-19 pandemic. All issues as above were discussed and addressed but no physical exam was performed. If it was felt that the patient should be evaluated in the office, they were directed there. The patient verbally consented to this visit. Patient was unable to complete an audio/visual visit due to Lack of equipment. Due to the catastrophic nature of the COVID-19 pandemic, this visit was done through audio contact only. . Location of the patient: home . Location of the provider: home . Those involved with this call:  . Provider: Marnee Guarneri, DNP . CMA: Yvonna Alanis, CMA . Front Desk/Registration: Jill Side  . Time spent on call: 15 minutes on the phone discussing health concerns. 10 minutes total spent in review of patient's record and preparation of their chart.  . I verified patient identity using two factors (patient name and date of birth). Patient consents verbally to being seen via telemedicine visit today.    KNEE PAIN Has been having "for a minute", hit her knee up against the steering wheel a few weeks back.  Since this time it has been sore.  It did have some swelling and a knot, states the knot is gone now and the swelling has improved some.  Has been placing ice on the area.  States the pain goes and comes, not painful that she can not bear it.  She is walking on it, but it is stiff and sore.  Has not had pain like this in knee before.  States the pain is better than what it was a few weeks ago and is improving.  Duration: months Involved knee: right Mechanism of injury: trauma Location:anterior Onset: gradual Severity: 7/10 at worst Quality:  dull and aching Frequency: intermittent Radiation: no Aggravating factors: bending  Alleviating factors: ice and APAP  Status: better Treatments attempted: ice and APAP , heating pad at night Relief with NSAIDs?:  No NSAIDs Taken Weakness with weight bearing or walking: no Sensation of giving way: no Locking: no Popping: no Bruising: no Swelling: no Redness: no Paresthesias/decreased sensation: no Fevers: no  Relevant past medical, surgical, family and social history reviewed and updated as indicated. Interim medical history since our last visit reviewed. Allergies and medications reviewed and updated.  Review of Systems  Constitutional: Negative for activity change, appetite change, diaphoresis, fatigue and fever.  Respiratory: Negative for cough, chest tightness and shortness of breath.   Cardiovascular: Negative for chest pain, palpitations and leg swelling.  Gastrointestinal: Negative for abdominal distention, abdominal pain, constipation, diarrhea, nausea and vomiting.  Musculoskeletal: Positive for arthralgias.  Psychiatric/Behavioral: Negative.     Per HPI unless specifically indicated above     Objective:    There were no vitals taken for this visit.  Wt Readings from Last 3 Encounters:  08/24/19 146 lb (66.2 kg)  07/14/19 140 lb (63.5 kg)  06/30/19 140 lb (63.5 kg)    Physical Exam   Unable to  perform due to telephone visit only.  She does report no tenderness with palpation of knee and no bruising or knots noted.  No swelling today per her report.  Results for orders placed or performed in visit on 07/14/19  Microscopic Examination   URINE  Result Value Ref Range   WBC, UA 6-10 (A) 0 - 5 /hpf   RBC None seen 0 - 2 /hpf   Epithelial Cells (non renal) 0-10 0 - 10 /hpf   Bacteria, UA Few (A) None seen/Few  Urine  Culture, Reflex   URINE  Result Value Ref Range   Urine Culture, Routine Final report    Organism ID, Bacteria Comment   UA/M w/rflx Culture, Routine   Specimen: Urine   URINE  Result Value Ref Range   Specific Gravity, UA 1.015 1.005 - 1.030   pH, UA 6.0 5.0 - 7.5   Color, UA Yellow Yellow   Appearance Ur Clear Clear   Leukocytes,UA 3+ (A) Negative   Protein,UA Negative Negative/Trace   Glucose, UA Negative Negative   Ketones, UA Negative Negative   RBC, UA Negative Negative   Bilirubin, UA Negative Negative   Urobilinogen, Ur 0.2 0.2 - 1.0 mg/dL   Nitrite, UA Negative Negative   Microscopic Examination See below:    Urinalysis Reflex Comment       Assessment & Plan:   Problem List Items Addressed This Visit      Other   Acute pain of right knee - Primary    Suspect small contusion due to trauma, she reports this is improving.  Will defer imaging at this time and obtain if worsening symptoms.  Continue simple treatment at home and add on compression sleeve and Voltaren gel to knee as needed.  Return to office for worsening or continued issues.         I discussed the assessment and treatment plan with the patient. The patient was provided an opportunity to ask questions and all were answered. The patient agreed with the plan and demonstrated an understanding of the instructions.   The patient was advised to call back or seek an in-person evaluation if the symptoms worsen or if the condition fails to improve as anticipated.   I provided 15 minutes of time during this encounter.  Follow up plan: Return if symptoms worsen or fail to improve.

## 2019-09-20 ENCOUNTER — Telehealth: Payer: Self-pay | Admitting: Nurse Practitioner

## 2019-09-20 NOTE — Telephone Encounter (Signed)
Patient calling and would like to talk to [facility] about her right knee pain that she said she is aware of the issue. Please call patient back, thanks.

## 2019-09-20 NOTE — Telephone Encounter (Signed)
Please advise patient if continuing to have knee pain would like to see her in office for visit to assess knee.

## 2019-09-20 NOTE — Telephone Encounter (Signed)
Called and spoke to patient. Message relayed. Scheduled for Friday 09/22/19 at 2:45 pm

## 2019-09-22 ENCOUNTER — Ambulatory Visit (INDEPENDENT_AMBULATORY_CARE_PROVIDER_SITE_OTHER): Payer: Medicare HMO | Admitting: Nurse Practitioner

## 2019-09-22 ENCOUNTER — Other Ambulatory Visit: Payer: Self-pay

## 2019-09-22 ENCOUNTER — Encounter: Payer: Self-pay | Admitting: Nurse Practitioner

## 2019-09-22 VITALS — BP 149/89 | HR 72 | Temp 98.4°F

## 2019-09-22 DIAGNOSIS — M25561 Pain in right knee: Secondary | ICD-10-CM | POA: Diagnosis not present

## 2019-09-22 NOTE — Progress Notes (Signed)
BP (!) 149/89   Pulse 72   Temp 98.4 F (36.9 C)   SpO2 98%    Subjective:    Patient ID: Kelly Poole, female    DOB: 03-28-1951, 68 y.o.   MRN: [id number]  HPI: Kelly Poole is a 68 y.o. female  Chief Complaint  Patient presents with  . Knee Pain    right   KNEE PAIN Has been having pain since August, hit her knee up against the steering wheel.  Pain started 2-3 weeks after this.  Since this time it has been sore.  It did have some swelling and a knot, states the knot is gone now and the swelling has improved some.  Has been placing ice on the area.  States the pain goes and comes, not so painful that she can not bear it.  She is walking on it, but it is stiff and sore.  Has not had pain like this in knee before.   Duration: months Involved knee: right Mechanism of injury: trauma Location:anterior Onset: gradual Severity: 7/10 at worst Quality:  dull and aching Frequency: intermittent Radiation: no Aggravating factors: bending  Alleviating factors: ice and APAP  Status: better Treatments attempted: APAP , heating pad at night Relief with NSAIDs?:  No NSAIDs Taken Weakness with weight bearing or walking: no Sensation of giving way: no Locking: no Popping: no Bruising: no Swelling: no Redness: no Paresthesias/decreased sensation: no Fevers: no  Relevant past medical, surgical, family and social history reviewed and updated as indicated. Interim medical history since our last visit reviewed. Allergies and medications reviewed and updated.  Review of Systems  Constitutional: Negative for activity change, appetite change, diaphoresis, fatigue and fever.  Respiratory: Negative for cough, chest tightness and shortness of breath.   Cardiovascular: Negative for chest pain, palpitations and leg swelling.  Gastrointestinal: Negative for abdominal distention, abdominal pain, constipation, diarrhea, nausea and vomiting.  Musculoskeletal: Positive for arthralgias.   Psychiatric/Behavioral: Negative.     Per HPI unless specifically indicated above     Objective:    BP (!) 149/89   Pulse 72   Temp 98.4 F (36.9 C)   SpO2 98%   Wt Readings from Last 3 Encounters:  08/24/19 146 lb (66.2 kg)  07/14/19 140 lb (63.5 kg)  06/30/19 140 lb (63.5 kg)    Physical Exam Vitals signs and nursing note reviewed.  Constitutional:      General: She is awake. She is not in acute distress.    Appearance: She is well-developed. She is obese. She is not ill-appearing.  HENT:     Head: Normocephalic.     Right Ear: Hearing normal.     Left Ear: Hearing normal.  Eyes:     General: Lids are normal.        Right eye: No discharge.        Left eye: No discharge.     Conjunctiva/sclera: Conjunctivae normal.     Pupils: Pupils are equal, round, and reactive to light.  Neck:     Musculoskeletal: Normal range of motion and neck supple.     Vascular: No carotid bruit.  Cardiovascular:     Rate and Rhythm: Normal rate and regular rhythm.     Heart sounds: Normal heart sounds. No murmur. No gallop.   Pulmonary:     Effort: Pulmonary effort is normal. No accessory muscle usage or respiratory distress.     Breath sounds: Normal breath sounds.  Abdominal:  General: Bowel sounds are normal.     Palpations: Abdomen is soft.  Musculoskeletal:     Right knee: She exhibits swelling (mild to medial aspect). She exhibits normal range of motion, no ecchymosis, no laceration, no erythema and no bony tenderness. Tenderness found.     Left knee: She exhibits normal range of motion, no swelling, no laceration and no erythema. No tenderness found.     Right lower leg: No edema.     Left lower leg: No edema.     Comments: Crepitus noted to left knee with mild tenderness to medial aspect of knee on palpation.  No decreased ROM noted.  Skin:    General: Skin is warm and dry.  Neurological:     Mental Status: She is alert and oriented to person, place, and time.   Psychiatric:        Attention and Perception: Attention normal.        Mood and Affect: Mood normal.        Behavior: Behavior normal. Behavior is cooperative.        Thought Content: Thought content normal.        Judgment: Judgment normal.     Results for orders placed or performed in visit on 07/14/19  Microscopic Examination   URINE  Result Value Ref Range   WBC, UA 6-10 (A) 0 - 5 /hpf   RBC None seen 0 - 2 /hpf   Epithelial Cells (non renal) 0-10 0 - 10 /hpf   Bacteria, UA Few (A) None seen/Few  Urine Culture, Reflex   URINE  Result Value Ref Range   Urine Culture, Routine Final report    Organism ID, Bacteria Comment   UA/M w/rflx Culture, Routine   Specimen: Urine   URINE  Result Value Ref Range   Specific Gravity, UA 1.015 1.005 - 1.030   pH, UA 6.0 5.0 - 7.5   Color, UA Yellow Yellow   Appearance Ur Clear Clear   Leukocytes,UA 3+ (A) Negative   Protein,UA Negative Negative/Trace   Glucose, UA Negative Negative   Ketones, UA Negative Negative   RBC, UA Negative Negative   Bilirubin, UA Negative Negative   Urobilinogen, Ur 0.2 0.2 - 1.0 mg/dL   Nitrite, UA Negative Negative   Microscopic Examination See below:    Urinalysis Reflex Comment       Assessment & Plan:   Problem List Items Addressed This Visit      Other   Acute pain of right knee - Primary    Suspect related to trauma in August.  Will order imaging.  She refuses PT at this time.  Continue simple treatment at home and add on compression sleeve and Voltaren gel to knee as needed.  Avoid Ibuprofen, may take Tylenol as needed.  Referral to ortho for further evaluation due to ongoing discomfort. Return in 4 weeks.      Relevant Orders   DG Knee Complete 4 Views Right   Ambulatory referral to Orthopedics       Follow up plan: Return in about 4 weeks (around 10/20/2019) for knee pain.

## 2019-09-22 NOTE — Patient Instructions (Addendum)
[address], [place], [place] [address]  Acute Knee Pain, Adult Many things can cause knee pain. Sometimes, knee pain is sudden (acute) and may be caused by damage, swelling, or irritation of the muscles and tissues that support your knee. The pain often goes away on its own with time and rest. If the pain does not go away, tests may be done to find out what is causing the pain. Follow these instructions at home: Pay attention to any changes in your symptoms. Take these actions to relieve your pain. If you have a knee sleeve or brace:   Wear the sleeve or brace as told by your doctor. Remove it only as told by your doctor.  Loosen the sleeve or brace if your toes: ? Tingle. ? Become numb. ? Turn cold and blue.  Keep the sleeve or brace clean.  If the sleeve or brace is not waterproof: ? Do not let it get wet. ? Cover it with a watertight covering when you take a bath or shower. Activity  Rest your knee.  Do not do things that cause pain.  Avoid activities where both feet leave the ground at the same time (high-impact activities). Examples are running, jumping rope, and doing jumping jacks.  Work with a physical therapist to make a safe exercise program, as told by your doctor. Managing pain, stiffness, and swelling   If told, put ice on the knee: ? Put ice in a plastic bag. ? Place a towel between your skin and the bag. ? Leave the ice on for 20 minutes, 2-3 times a day.  If told, put pressure (compression) on your injured knee to control swelling, give support, and help with discomfort. Compression may be done with an elastic bandage. General instructions  Take all medicines only as told by your doctor.  Raise (elevate) your knee while you are sitting or lying down. Make sure your knee is higher than your heart.  Sleep with a pillow under your knee.  Do not use any products that contain nicotine or tobacco. These include cigarettes, e-cigarettes, and chewing  tobacco. These products may slow down healing. If you need help quitting, ask your doctor.  If you are overweight, work with your doctor and a food expert (dietitian) to set goals to lose weight. Being overweight can make your knee hurt more.  Keep all follow-up visits as told by your doctor. This is important. Contact a doctor if:  The knee pain does not stop.  The knee pain changes or gets worse.  You have a fever along with knee pain.  Your knee feels warm when you touch it.  Your knee gives out or locks up. Get help right away if:  Your knee swells, and the swelling gets worse.  You cannot move your knee.  You have very bad knee pain. Summary  Many things can cause knee pain. The pain often goes away on its own with time and rest.  Your doctor may do tests to find out the cause of the pain.  Pay attention to any changes in your symptoms. Relieve your pain with rest, medicines, light activity, and use of ice.  Get help right away if you cannot move your knee or your knee pain is very bad. This information is not intended to replace advice given to you by your health care provider. Make sure you discuss any questions you have with your health care provider. Document Released: 03/05/2009 Document Revised: 05/19/2018 Document Reviewed: 05/19/2018   Elsevier Patient Education  2020 [employer] Inc[employer]  

## 2019-09-22 NOTE — Assessment & Plan Note (Signed)
Suspect related to trauma in August.  Will order imaging.  She refuses PT at this time.  Continue simple treatment at home and add on compression sleeve and Voltaren gel to knee as needed.  Avoid Ibuprofen, may take Tylenol as needed.  Referral to ortho for further evaluation due to ongoing discomfort. Return in 4 weeks.

## 2019-09-25 ENCOUNTER — Ambulatory Visit
Admission: RE | Admit: 2019-09-25 | Discharge: 2019-09-25 | Disposition: A | Discharge status: Home / Self Care | Payer: Medicare HMO | Source: Ambulatory Visit | Attending: Nurse Practitioner | Admitting: Nurse Practitioner

## 2019-09-25 ENCOUNTER — Other Ambulatory Visit: Payer: Self-pay

## 2019-09-25 DIAGNOSIS — M25561 Pain in right knee: Secondary | ICD-10-CM

## 2019-09-25 IMAGING — CR DG KNEE COMPLETE 4+V*R*
1 series · 4 of 4 positions shown · non-contrast
Comparison: None.

CLINICAL DATA: Right knee pain

EXAM:
RIGHT KNEE - COMPLETE 4+ VIEW

[Series 1: dg knee complete 4 views right · 0.14mm/px · 4 of 4 slices shown]
[im 1/4]
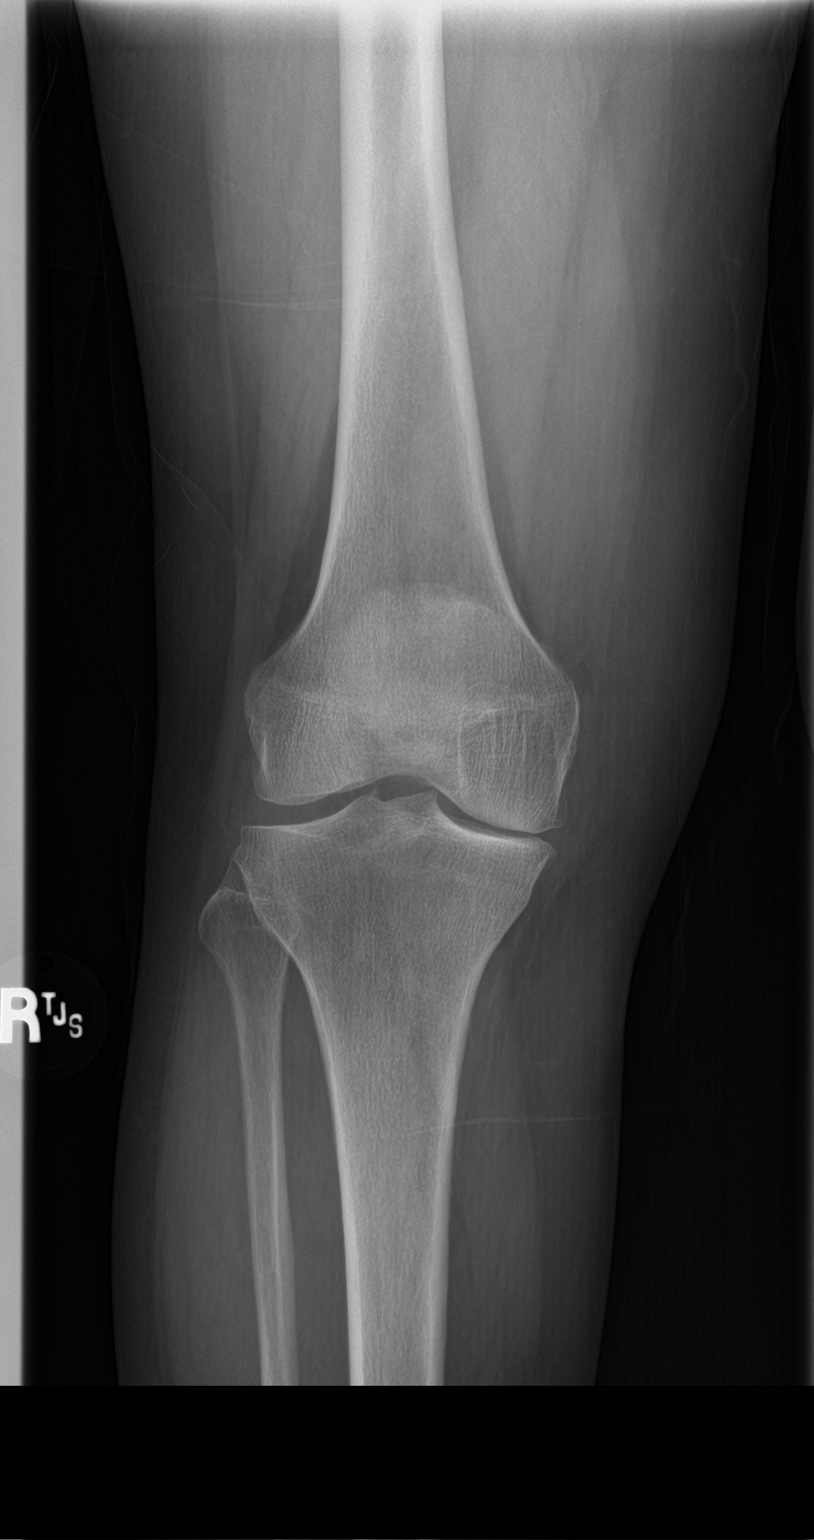
[im 2/4]
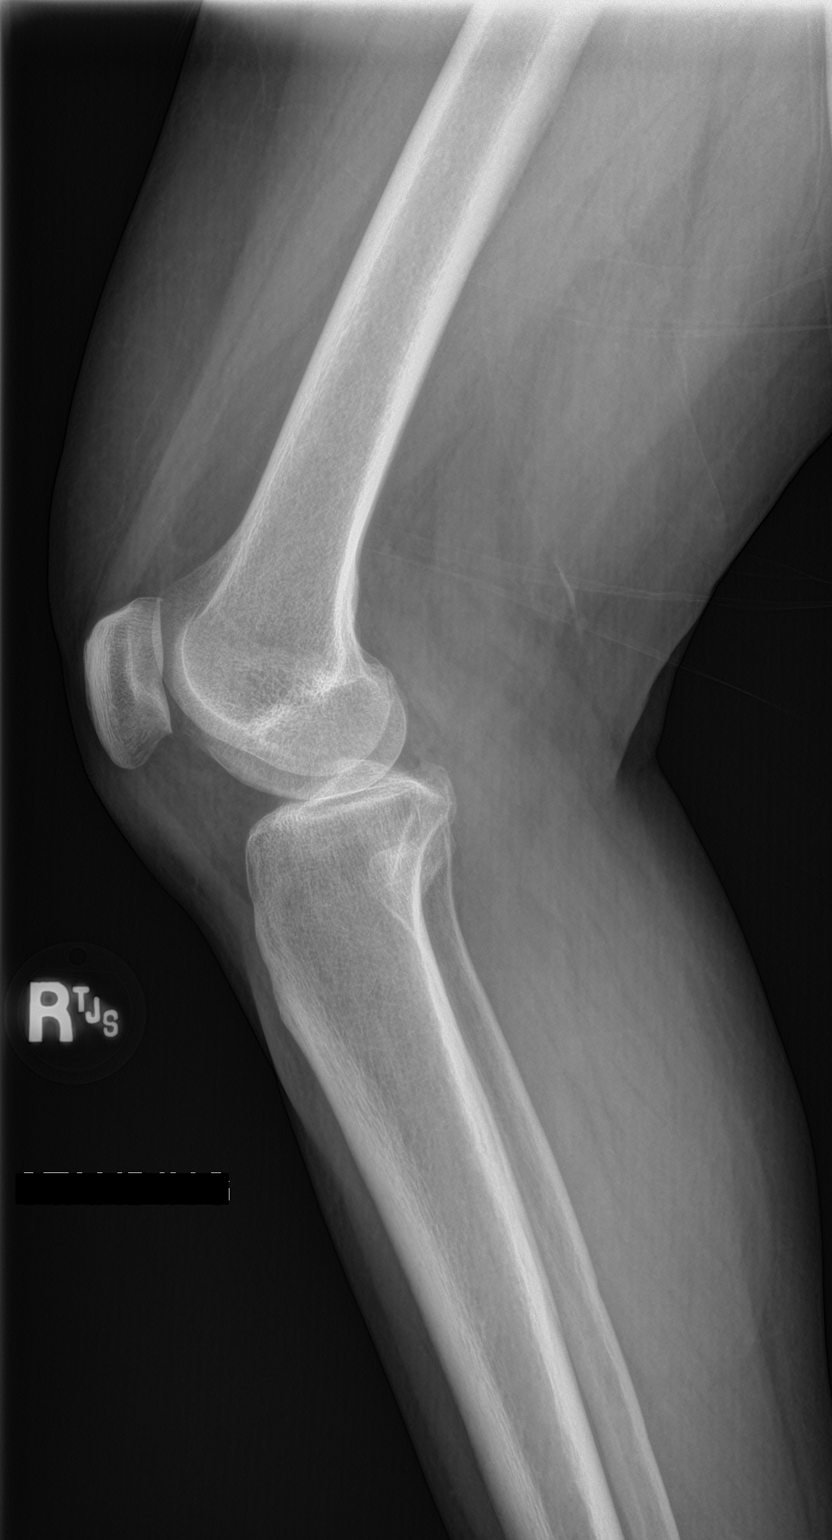
[im 3/4]
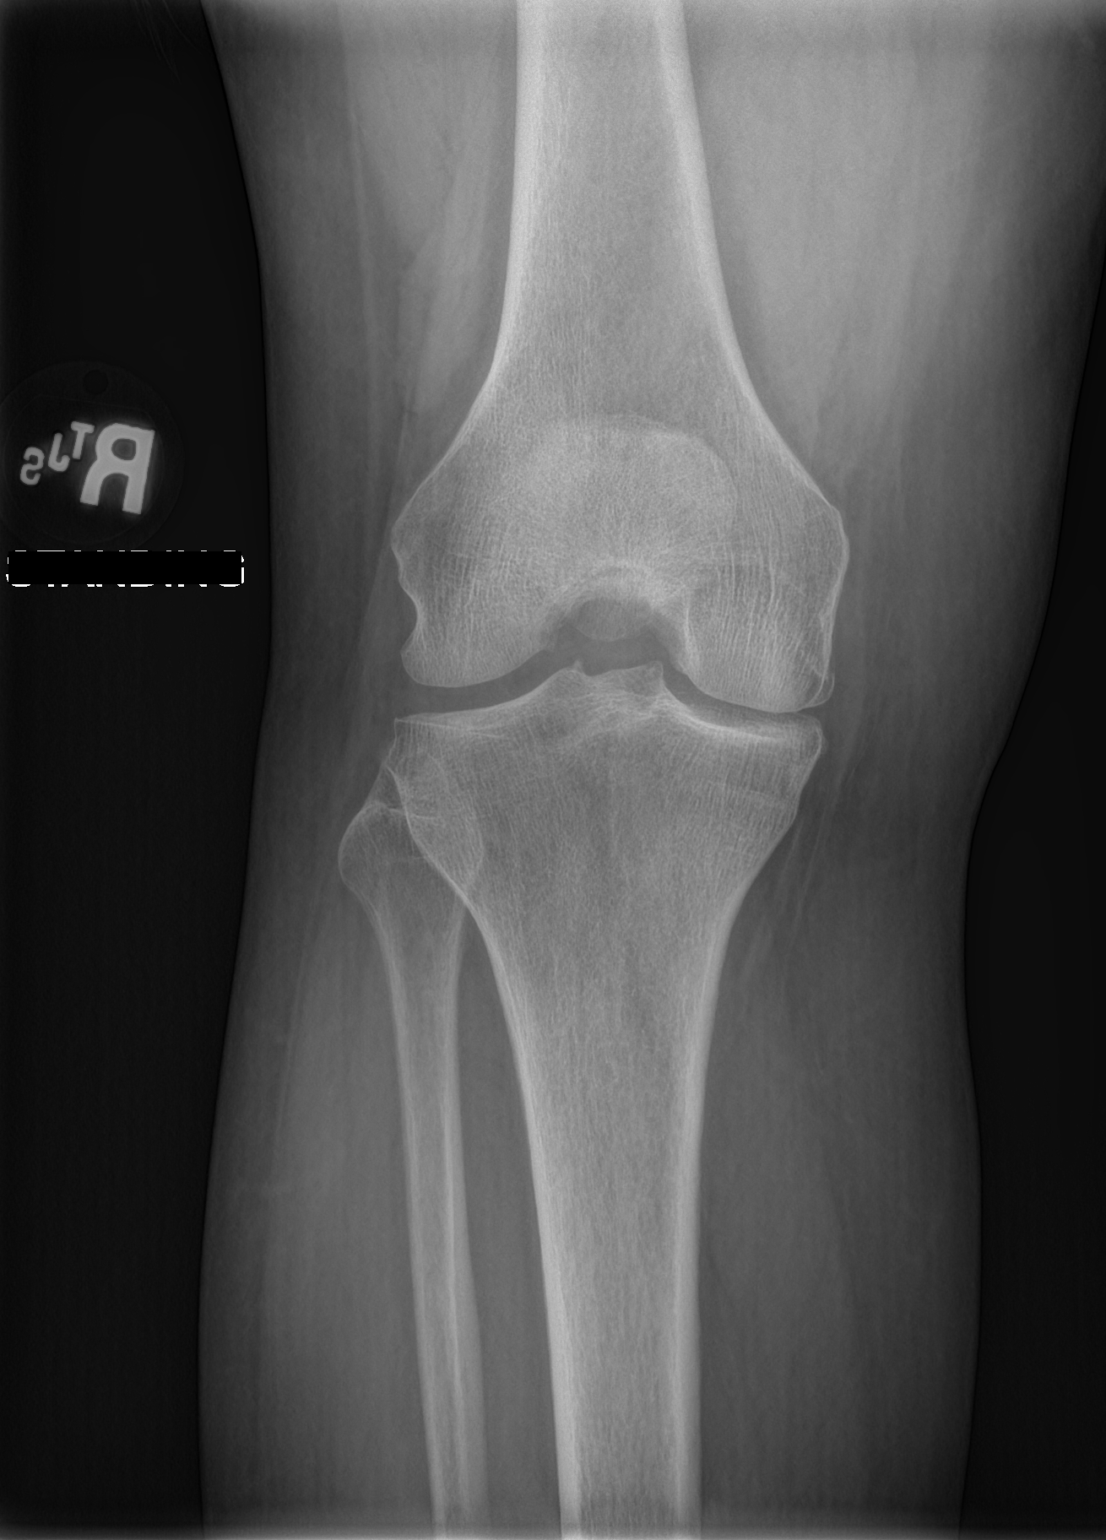
[im 4/4]
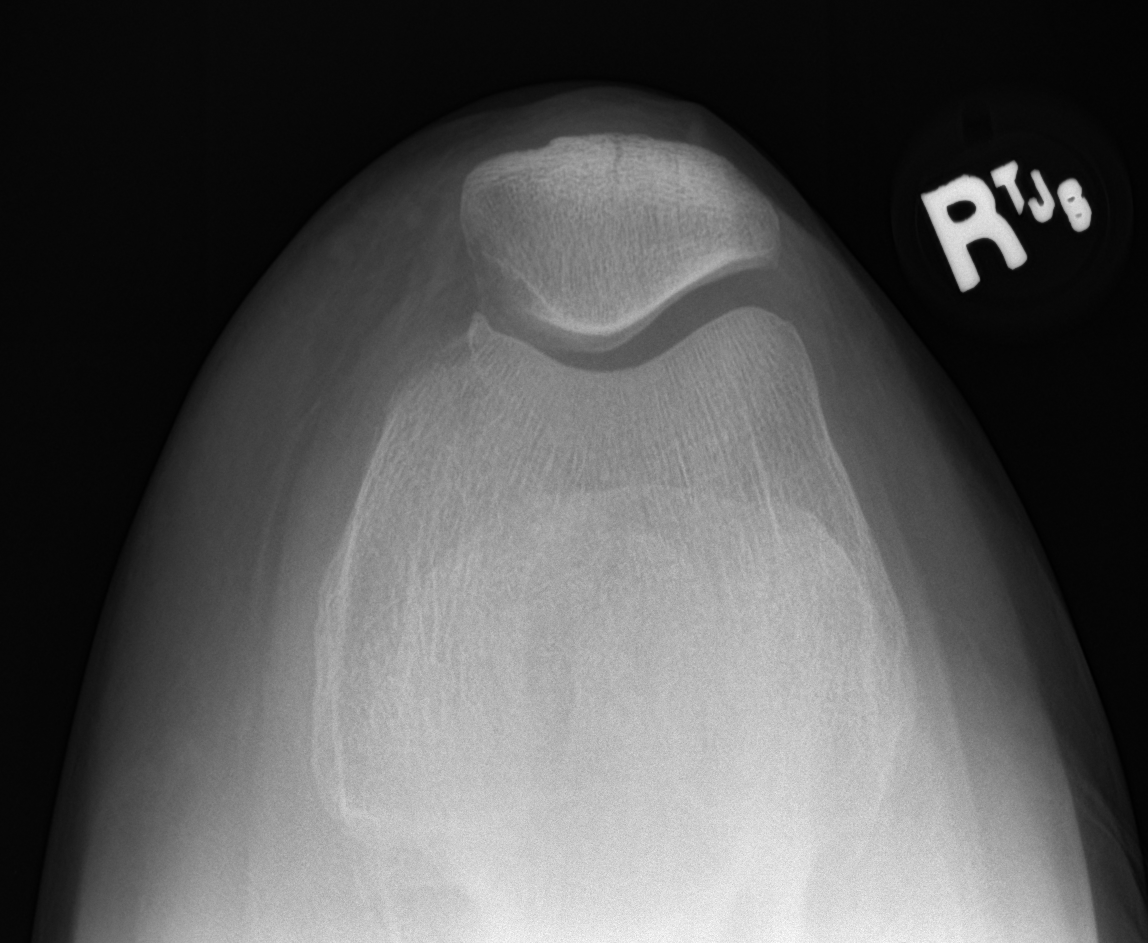

[4 of 4 positions shown; findings below may reference images not displayed]

FINDINGS: No acute fracture or dislocation. No aggressive osseous lesion.
Moderate medial femorotibial compartment joint space narrowing. No
joint effusion.
IMPRESSION: 1.  No acute osseous injury of the right knee.
2. Moderate osteoarthritis of the medial femorotibial compartment.

## 2019-09-27 ENCOUNTER — Other Ambulatory Visit: Payer: Self-pay | Admitting: Family Medicine

## 2019-09-27 ENCOUNTER — Other Ambulatory Visit: Payer: Self-pay | Admitting: Nurse Practitioner

## 2019-09-27 NOTE — Telephone Encounter (Signed)
Requested medication (s) are due for refill today:   Yes  Requested medication (s) are on the active medication list:   Yes  Future visit scheduled:   Yes 10/20/2019 with Cannady   Last ordered: 8/20/202  #30  0 refills.    Returned because 30 day courtesy refill has already been given.   Requested Prescriptions  Pending Prescriptions Disp Refills   atorvastatin (LIPITOR) 10 MG tablet [Pharmacy Med Name: Atorvastatin Calcium 10 MG Oral Tablet] 30 tablet 0    Sig: Take 1 tablet by mouth once daily     Cardiovascular:  Antilipid - Statins Failed - 09/27/2019  3:02 PM      Failed - Total Cholesterol in normal range and within 360 days    Cholesterol, Total  Date Value Ref Range Status  08/23/2018 165 100 - 199 mg/dL Final   Cholesterol Piccolo, Waived  Date Value Ref Range Status  06/03/2015 178 <200 mg/dL Final    Comment:                            Desirable                <200                         Borderline High      200- 239                         High                     >239          Failed - LDL in normal range and within 360 days    LDL Calculated  Date Value Ref Range Status  08/23/2018 89 0 - 99 mg/dL Final         Failed - HDL in normal range and within 360 days    HDL  Date Value Ref Range Status  08/23/2018 58 >39 mg/dL Final         Failed - Triglycerides in normal range and within 360 days    Triglycerides  Date Value Ref Range Status  08/23/2018 92 0 - 149 mg/dL Final   Triglycerides Piccolo,Waived  Date Value Ref Range Status  06/03/2015 93 <150 mg/dL Final    Comment:                            Normal                   <150                         Borderline High     150 - 199                         High                200 - 499                         Very High                >499          Passed - Patient is not pregnant  Passed - Valid encounter within last 12 months    Recent Outpatient Visits          5 days ago Acute pain  of right knee   [facility] [place], Henrine Screws T, NP   2 weeks ago Acute pain of right knee   [facility] [place], [place] T, NP   3 weeks ago Vitamin B12 deficiency (dietary) anemia   [facility], Barbaraann Faster, NP   1 month ago Contusion of right knee, initial encounter   [facility] [place], [place]   2 months ago [place], Barbaraann Faster, NP      Future Appointments            In 3 weeks Cannady, Barbaraann Faster, NP [facility], [place]   In 3 months [place], Barbaraann Faster, NP [facility], [facility]

## 2019-09-27 NOTE — Telephone Encounter (Signed)
Routing to provider  

## 2019-09-28 ENCOUNTER — Telehealth: Payer: Self-pay | Admitting: Nurse Practitioner

## 2019-09-28 NOTE — Telephone Encounter (Signed)
Looks like Welcome to [employer] she is eligible for.  We will see if Tiffany or Curt Bears can tell us when it needs to be done by.

## 2019-09-28 NOTE — Telephone Encounter (Signed)
Nursing crew can you get patient scheduled for this in my upcoming schedule at some point, in office.  Tiffany and Curt Bears can you provide me more info on this and what I need to document, as this is new to me.  We did not perform these in [place] setting.  Thanks.  I will be back in office later in the morning on October 16th.

## 2019-09-28 NOTE — Telephone Encounter (Signed)
Kelly Poole, what needs to be done with this patient? Is it a welcome to medicare, if so when does it need to be done by?

## 2019-09-28 NOTE — Telephone Encounter (Signed)
Called patient, she joined medicare last year, she is eligible for [id number]

## 2019-10-02 ENCOUNTER — Telehealth: Payer: Self-pay | Admitting: Nurse Practitioner

## 2019-10-02 NOTE — Telephone Encounter (Signed)
Yes, this will be fine.

## 2019-10-02 NOTE — Telephone Encounter (Signed)
Thank you, noted.

## 2019-10-02 NOTE — Telephone Encounter (Signed)
Done

## 2019-10-02 NOTE — Telephone Encounter (Signed)
Thank you Tiffany, this is helpful.  I will see if I can get her in before 10/16/2019 and if she is interested.  Tiffany R can you see if patient can come in for this before 10/16/19 and is interested.  Thank you.

## 2019-10-02 NOTE — Telephone Encounter (Signed)
Patient was enrolled in October 2019 so the Welcome to Medicare was actually due before 09/21/19 I will call pt to reach out about the 10/23 welcome to medicare with Jolene and let her know it will be with Tiffany for AWV-initial.

## 2019-10-02 NOTE — Telephone Encounter (Signed)
Looks like she is eligible for welcome to medicare until her birthday 10/16/2017 but Curt Bears is going to double check a specific site and let us know for sure.   Jolene I will provide you with some information on Welcome to Medicare, a good resource on those will also be Dr.Johnson.

## 2019-10-02 NOTE — Telephone Encounter (Signed)
Can this be done on 10/13/19 at 4pm, she has to have an appt after 230 pm.

## 2019-10-02 NOTE — Telephone Encounter (Signed)
Kelly Poole: Can you add this patient for a welcome to Medicare on 10/13/19 at 4pm, ok per [facility]

## 2019-10-12 ENCOUNTER — Other Ambulatory Visit: Payer: Self-pay

## 2019-10-12 ENCOUNTER — Ambulatory Visit (INDEPENDENT_AMBULATORY_CARE_PROVIDER_SITE_OTHER): Payer: Medicare HMO

## 2019-10-12 VITALS — BP 102/64 | HR 80 | Temp 97.8°F | Resp 15 | Ht <= 58 in | Wt 147.5 lb

## 2019-10-12 DIAGNOSIS — Z0000 Encounter for general adult medical examination without abnormal findings: Secondary | ICD-10-CM | POA: Diagnosis not present

## 2019-10-12 NOTE — Patient Instructions (Signed)
Ms. Evjen , Thank you for taking time to come for your Medicare Wellness Visit. I appreciate your ongoing commitment to your health goals. Please review the following plan we discussed and let me know if I can assist you in the future.   Screening recommendations/referrals: Colonoscopy: up to date Mammogram: up to date Bone Density: postponed until next year  Recommended yearly ophthalmology/optometry visit for glaucoma screening and checkup Recommended yearly dental visit for hygiene and checkup  Vaccinations: Influenza vaccine: declined Pneumococcal vaccine: declined  Tdap vaccine: declined  Shingles vaccine: shingrix eligible     Advanced directives: Advance directive discussed with you today. I have provided a copy for you to complete at home and have notarized. Once this is complete please bring a copy in to our office so we can scan it into your chart.  Conditions/risks identified: discussed chronic care management team   Next appointment: Follow up in one year for your annual wellness visit.    Preventive Care 38 Years and Older, Female Preventive care refers to lifestyle choices and visits with your health care provider that can promote health and wellness. What does preventive care include?  A yearly physical exam. This is also called an annual well check.  Dental exams once or twice a year.  Routine eye exams. Ask your health care provider how often you should have your eyes checked.  Personal lifestyle choices, including:  Daily care of your teeth and gums.  Regular physical activity.  Eating a healthy diet.  Avoiding tobacco and drug use.  Limiting alcohol use.  Practicing safe sex.  Taking low-dose aspirin every day.  Taking vitamin and mineral supplements as recommended by your health care provider. What happens during an annual well check? The services and screenings done by your health care provider during your annual well check will depend on your  age, overall health, lifestyle risk factors, and family history of disease. Counseling  Your health care provider may ask you questions about your:  Alcohol use.  Tobacco use.  Drug use.  Emotional well-being.  Home and relationship well-being.  Sexual activity.  Eating habits.  History of falls.  Memory and ability to understand (cognition).  Work and work Statistician.  Reproductive health. Screening  You may have the following tests or measurements:  Height, weight, and BMI.  Blood pressure.  Lipid and cholesterol levels. These may be checked every 5 years, or more frequently if you are over 62 years old.  Skin check.  Lung cancer screening. You may have this screening every year starting at age 53 if you have a 30-pack-year history of smoking and currently smoke or have quit within the past 15 years.  Fecal occult blood test (FOBT) of the stool. You may have this test every year starting at age 59.  Flexible sigmoidoscopy or colonoscopy. You may have a sigmoidoscopy every 5 years or a colonoscopy every 10 years starting at age 43.  Hepatitis C blood test.  Hepatitis B blood test.  Sexually transmitted disease (STD) testing.  Diabetes screening. This is done by checking your blood sugar (glucose) after you have not eaten for a while (fasting). You may have this done every 1-3 years.  Bone density scan. This is done to screen for osteoporosis. You may have this done starting at age 35.  Mammogram. This may be done every 1-2 years. Talk to your health care provider about how often you should have regular mammograms. Talk with your health care provider about your test  results, treatment options, and if necessary, the need for more tests. Vaccines  Your health care provider may recommend certain vaccines, such as:  Influenza vaccine. This is recommended every year.  Tetanus, diphtheria, and acellular pertussis (Tdap, Td) vaccine. You may need a Td booster  every 10 years.  Zoster vaccine. You may need this after age 27.  Pneumococcal 13-valent conjugate (PCV13) vaccine. One dose is recommended after age 65.  Pneumococcal polysaccharide (PPSV23) vaccine. One dose is recommended after age 30. Talk to your health care provider about which screenings and vaccines you need and how often you need them. This information is not intended to replace advice given to you by your health care provider. Make sure you discuss any questions you have with your health care provider. Document Released: 01/03/2016 Document Revised: 08/26/2016 Document Reviewed: 10/08/2015 Elsevier Interactive Patient Education  2017 [place] Prevention in the Home Falls can cause injuries. They can happen to people of all ages. There are many things you can do to make your home safe and to help prevent falls. What can I do on the outside of my home?  Regularly fix the edges of walkways and driveways and fix any cracks.  Remove anything that might make you trip as you walk through a door, such as a raised step or threshold.  Trim any bushes or trees on the path to your home.  Use bright outdoor lighting.  Clear any walking paths of anything that might make someone trip, such as rocks or tools.  Regularly check to see if handrails are loose or broken. Make sure that both sides of any steps have handrails.  Any raised decks and porches should have guardrails on the edges.  Have any leaves, snow, or ice cleared regularly.  Use sand or salt on walking paths during winter.  Clean up any spills in your garage right away. This includes oil or grease spills. What can I do in the bathroom?  Use night lights.  Install grab bars by the toilet and in the tub and shower. Do not use towel bars as grab bars.  Use non-skid mats or decals in the tub or shower.  If you need to sit down in the shower, use a plastic, non-slip stool.  Keep the floor dry. Clean up any  water that spills on the floor as soon as it happens.  Remove soap buildup in the tub or shower regularly.  Attach bath mats securely with double-sided non-slip rug tape.  Do not have throw rugs and other things on the floor that can make you trip. What can I do in the bedroom?  Use night lights.  Make sure that you have a light by your bed that is easy to reach.  Do not use any sheets or blankets that are too big for your bed. They should not hang down onto the floor.  Have a firm chair that has side arms. You can use this for support while you get dressed.  Do not have throw rugs and other things on the floor that can make you trip. What can I do in the kitchen?  Clean up any spills right away.  Avoid walking on wet floors.  Keep items that you use a lot in easy-to-reach places.  If you need to reach something above you, use a strong step stool that has a grab bar.  Keep electrical cords out of the way.  Do not use floor polish or wax that makes  floors slippery. If you must use wax, use non-skid floor wax.  Do not have throw rugs and other things on the floor that can make you trip. What can I do with my stairs?  Do not leave any items on the stairs.  Make sure that there are handrails on both sides of the stairs and use them. Fix handrails that are broken or loose. Make sure that handrails are as long as the stairways.  Check any carpeting to make sure that it is firmly attached to the stairs. Fix any carpet that is loose or worn.  Avoid having throw rugs at the top or bottom of the stairs. If you do have throw rugs, attach them to the floor with carpet tape.  Make sure that you have a light switch at the top of the stairs and the bottom of the stairs. If you do not have them, ask someone to add them for you. What else can I do to help prevent falls?  Wear shoes that:  Do not have high heels.  Have rubber bottoms.  Are comfortable and fit you well.  Are closed  at the toe. Do not wear sandals.  If you use a stepladder:  Make sure that it is fully opened. Do not climb a closed stepladder.  Make sure that both sides of the stepladder are locked into place.  Ask someone to hold it for you, if possible.  Clearly mark and make sure that you can see:  Any grab bars or handrails.  First and last steps.  Where the edge of each step is.  Use tools that help you move around (mobility aids) if they are needed. These include:  Canes.  Walkers.  Scooters.  Crutches.  Turn on the lights when you go into a dark area. Replace any light bulbs as soon as they burn out.  Set up your furniture so you have a clear path. Avoid moving your furniture around.  If any of your floors are uneven, fix them.  If there are any pets around you, be aware of where they are.  Review your medicines with your doctor. Some medicines can make you feel dizzy. This can increase your chance of falling. Ask your doctor what other things that you can do to help prevent falls. This information is not intended to replace advice given to you by your health care provider. Make sure you discuss any questions you have with your health care provider. Document Released: 10/03/2009 Document Revised: 05/14/2016 Document Reviewed: 01/11/2015 Elsevier Interactive Patient Education  2017 [employer].

## 2019-10-12 NOTE — Progress Notes (Signed)
Subjective:   Kelly Poole is a 68 y.o. female who presents for an Initial Medicare Annual Wellness Visit.  Review of Systems      Cardiac Risk Factors include: advanced age (>43men, >47 women);dyslipidemia;hypertension     Objective:    Today's Vitals   10/12/19 1506  BP: 102/64  Pulse: 80  Resp: 15  Temp: 97.8 F (36.6 C)  TempSrc: Temporal  Weight: 147 lb 8.3 oz (66.9 kg)  Height: 4' 8.5" (1.435 m)   Body mass index is 32.49 kg/m.  Advanced Directives 10/12/2019  Does Patient Have a Medical Advance Directive? No  Would patient like information on creating a medical advance directive? Yes (MAU/Ambulatory/Procedural Areas - Information given)    Current Medications (verified) Outpatient Encounter Medications as of 10/12/2019  Medication Sig  . aspirin 81 MG tablet Take 81 mg by mouth daily.  Marland Kitchen atorvastatin (LIPITOR) 10 MG tablet Take 1 tablet by mouth once daily  . BLACK COHOSH PO Take 1 tablet by mouth daily.  . Cetirizine HCl (ALLERGY RELIEF) 10 MG CAPS Take by mouth.  . ferrous sulfate 325 (65 FE) MG tablet Take 325 mg by mouth daily with breakfast.  . Garlic [id number] MG CAPS Take 1,000 mg by mouth daily.  Marland Kitchen lisinopril (ZESTRIL) 10 MG tablet Take 1 tablet (10 mg total) by mouth daily.  . Multiple Vitamin (MULTIVITAMINS PO) Take by mouth daily.  Marland Kitchen omeprazole (PRILOSEC) 20 MG capsule Take 1 capsule by mouth once daily  . vitamin B-12 (CYANOCOBALAMIN) 1000 MCG tablet Take 1,000 mcg by mouth daily.  . vitamin C (ASCORBIC ACID) 500 MG tablet Take 500 mg by mouth daily.   No facility-administered encounter medications on file as of 10/12/2019.     Allergies (verified) Macrobid [nitrofurantoin macrocrystal]   History: Past Medical History:  Diagnosis Date  . Breast cancer ([place]) 1991   left breast mastectomy. No chemo or rad tx  . GERD (gastroesophageal reflux disease)   . Heart murmur   . High cholesterol   . Hypertension    Past Surgical History:   Procedure Laterality Date  . ABDOMINAL HYSTERECTOMY  1978  . MASTECTOMY Left 1991   Due to breast cancer  . TUBAL LIGATION     Family History  Problem Relation Age of Onset  . Stroke Mother   . Thyroid disease Mother   . Cancer Father   . Diabetes Maternal Aunt   . Breast cancer Other    Social History   Socioeconomic History  . Marital status: Single    Spouse name: Not on file  . Number of children: Not on file  . Years of education: Not on file  . Highest education level: Not on file  Occupational History  . Not on file  Social Needs  . Financial resource strain: Not hard at all  . Food insecurity    Worry: Never true    Inability: Never true  . Transportation needs    Medical: No    Non-medical: No  Tobacco Use  . Smoking status: Never Smoker  . Smokeless tobacco: Never Used  Substance and Sexual Activity  . Alcohol use: No    Alcohol/week: 0.0 standard drinks  . Drug use: No  . Sexual activity: Yes  Lifestyle  . Physical activity    Days per week: 0 days    Minutes per session: 0 min  . Stress: Not at all  Relationships  . Social Herbalist on phone: More  than three times a week    Gets together: More than three times a week    Attends religious service: More than 4 times per year    Active member of club or organization: No    Attends meetings of clubs or organizations: Never    Relationship status: Married  Other Topics Concern  . Not on file  Social History Narrative  . Not on file    Tobacco Counseling Counseling given: Not Answered   Clinical Intake:  Pre-visit preparation completed: Yes  Pain : No/denies pain     Nutritional Status: BMI > 30  Obese Nutritional Risks: None Diabetes: No  How often do you need to have someone help you when you read instructions, pamphlets, or other written materials from your doctor or pharmacy?: 1 - Never  Interpreter Needed?: No  Information entered by ::  ,LPN    Activities of Daily Living In your present state of health, do you have any difficulty performing the following activities: 10/12/2019 09/22/2019  Hearing? N N  Comment no hearing aids -  Vision? N N  Comment reading glasses, goes to [facility] -  Difficulty concentrating or making decisions? N N  Walking or climbing stairs? N N  Dressing or bathing? N N  Doing errands, shopping? N N  Preparing Food and eating ? N -  Using the Toilet? N -  In the past six months, have you accidently leaked urine? N -  Do you have problems with loss of bowel control? N -  Managing your Medications? N -  Managing your Finances? N -  Housekeeping or managing your Housekeeping? N -  Some recent data might be hidden     Immunizations and Health Maintenance Immunization History  Administered Date(s) Administered  . Td 12/22/2003   There are no preventive care reminders to display for this patient.  Patient Care Team: Venita Lick, NP as PCP - General (Nurse Practitioner)  Indicate any recent Medical Services you may have received from other than [facility] providers in the past year (date may be approximate).     Assessment:   This is a routine wellness examination for Kelly Poole.  Hearing/Vision screen No exam data present  Dietary issues and exercise activities discussed: Current Exercise Habits: The patient has a physically strenuous job, but has no regular exercise apart from work., Exercise limited by: None identified  Goals   None    Depression Screen PHQ 2/9 Scores 10/12/2019 09/22/2019 08/23/2018 08/09/2017  PHQ - 2 Score 0 0 0 0  PHQ- 9 Score - - 7 -    Fall Risk Fall Risk  10/12/2019 09/22/2019 08/23/2018 08/09/2017  Falls in the past year? 0 0 No No  Number falls in past yr: 0 0 - -  Injury with Fall? 0 0 - -   FALL RISK PREVENTION PERTAINING TO THE HOME:  Any stairs in or around the home? No, just 2 steps going into home.  If so, are there any without handrails? No   Home  free of loose throw rugs in walkways, pet beds, electrical cords, etc? Yes  Adequate lighting in your home to reduce risk of falls? Yes   ASSISTIVE DEVICES UTILIZED TO PREVENT FALLS:  Life alert? No  Use of a cane, walker or w/c? No  Grab bars in the bathroom? No  Shower chair or bench in shower? No  Elevated toilet seat or a handicapped toilet? No    DME ORDERS:  DME order  needed?  No   TIMED UP AND GO:  Was the test performed? Yes .  Length of time to ambulate 10 feet: 8 sec.   GAIT:  Appearance of gait: Gait steady and fast with/without the use of an assistive device.  Education: Fall risk prevention has been discussed.  Intervention(s) required? No   DME/home health order needed?  No     Cognitive Function:     6CIT Screen 10/12/2019  What Year? 0 points  What month? 0 points  What time? 0 points  Count back from 20 0 points  Months in reverse 0 points  Repeat phrase 2 points  Total Score 2    Screening Tests Health Maintenance  Topic Date Due  . INFLUENZA VACCINE  03/20/2020 (Originally 07/22/2019)  . TETANUS/TDAP  03/23/2020 (Originally 12/21/2013)  . PNA vac Low Risk Adult (1 of 2 - PCV13) 03/23/2020 (Originally 10/15/2018)  . DEXA SCAN  10/11/2020 (Originally 10/15/2018)  . MAMMOGRAM  09/04/2021  . COLONOSCOPY  01/16/2023  . Hepatitis C Screening  Completed  . HIV Screening  Completed    Qualifies for Shingles Vaccine? Yes  Zostavax completed n/a. Due for Shingrix. Education has been provided regarding the importance of this vaccine. Pt has been advised to call insurance company to determine out of pocket expense. Advised may also receive vaccine at local pharmacy or Health Dept. Verbalized acceptance and understanding.  Tdap: declined  Flu Vaccine: declined  Pneumococcal Vaccine: declined  Cancer Screenings:  Colorectal Screening: Completed 01/16/2013. Repeat every 10 years  Mammogram: Completed 09/05/2019. Repeat every year   Bone Density:  postpone until next mammogram.   Lung Cancer Screening: (Low Dose CT Chest recommended if Age 30-80 years, 30 pack-year currently smoking OR have quit w/in 15years.) does not qualify.     Additional Screening:  Hepatitis C Screening: does qualify; Completed 08/09/2017  Vision Screening: Recommended annual ophthalmology exams for early detection of glaucoma and other disorders of the eye. Is the patient up to date with their annual eye exam?  Yes  Who is the provider or what is the name of the office in which the pt attends annual eye exams? [facility]  Dental Screening: Recommended annual dental exams for proper oral hygiene  Community Resource Referral:  CRR required this visit? No      Plan:  I have personally reviewed and addressed the Medicare Annual Wellness questionnaire and have noted the following in the patient's chart:  A. Medical and social history B. Use of alcohol, tobacco or illicit drugs  C. Current medications and supplements D. Functional ability and status E.  Nutritional status F.  Physical activity G. Advance directives H. List of other physicians I.  Hospitalizations, surgeries, and ER visits in previous 12 months J.  [place] such as hearing and vision if needed, cognitive and depression L. Referrals and appointments   In addition, I have reviewed and discussed with patient certain preventive protocols, quality metrics, and best practice recommendations. A written personalized care plan for preventive services as well as general preventive health recommendations were provided to patient.   Signed,    Bevelyn Ngo, LPN   [id number]  Nurse Health Advisor   Nurse Notes: none

## 2019-10-13 ENCOUNTER — Ambulatory Visit: Payer: Medicare HMO | Admitting: Nurse Practitioner

## 2019-10-13 DIAGNOSIS — M1711 Unilateral primary osteoarthritis, right knee: Secondary | ICD-10-CM | POA: Diagnosis not present

## 2019-10-13 DIAGNOSIS — M25561 Pain in right knee: Secondary | ICD-10-CM | POA: Diagnosis not present

## 2019-10-20 ENCOUNTER — Ambulatory Visit: Payer: Medicare HMO | Admitting: Nurse Practitioner

## 2019-11-25 DIAGNOSIS — Z20828 Contact with and (suspected) exposure to other viral communicable diseases: Secondary | ICD-10-CM | POA: Diagnosis not present

## 2019-11-28 ENCOUNTER — Ambulatory Visit: Payer: Self-pay

## 2019-11-28 NOTE — Telephone Encounter (Signed)
Scheduled virtual visit with [facility] tomorrow

## 2019-11-28 NOTE — Telephone Encounter (Signed)
Summary: please advise   Pt has tested positive for covid and would like to know what she can drink and to talk with nurse      Patient is not having any symptoms: some stuffiness with nose- thought allergy. Patient states she is doing well drinking orange juice and water. Explained monitor for symptoms- she may have very few- treat as needed and as normal. Patient to continue isolation for recommended time. Will send note to PCP for any further recommendations.    Reason for Disposition . [1] [id number] diagnosed by positive lab test AND [2] mild symptoms (e.g., cough, fever, others) AND [id number] no complications or SOB  Answer Assessment - Initial Assessment Questions 1. COVID-19 DIAGNOSIS: "Who made your Coronavirus (COVID-19) diagnosis?" "Was it confirmed by a positive lab test?" If not diagnosed by a HCP, ask "Are there lots of cases (community spread) where you live?" (See public health department website, if unsure)     Patient was tested at her [facility] pop up site 2. COVID-19 EXPOSURE: "Was there any known exposure to COVID before the symptoms began?" CDC Definition of close contact: within 6 feet (2 meters) for a total of 15 minutes or more over a 24-hour period.      No- patient was screening 3. ONSET: "When did the COVID-19 symptoms start?"      Reports no symptoms- normal allergy symptoms- not prominent 4. WORST SYMPTOM: "What is your worst symptom?" (e.g., cough, fever, shortness of breath, muscle aches)     stuffy nose 5. COUGH: "Do you have a cough?" If so, ask: "How bad is the cough?"       no 6. FEVER: "Do you have a fever?" If so, ask: "What is your temperature, how was it measured, and when did it start?"     no 7. RESPIRATORY STATUS: "Describe your breathing?" (e.g., shortness of breath, wheezing, unable to speak)      normal 8. BETTER-SAME-WORSE: "Are you getting better, staying the same or getting worse compared to yesterday?"  If getting worse, ask, "In what way?"      same 9. HIGH RISK DISEASE: "Do you have any chronic medical problems?" (e.g., asthma, heart or lung disease, weak immune system, obesity, etc.)     age 68. PREGNANCY: "Is there any chance you are pregnant?" "When was your last menstrual period?"       n/a 11. OTHER SYMPTOMS: "Do you have any other symptoms?"  (e.g., chills, fatigue, headache, loss of smell or taste, muscle pain, sore throat; new loss of smell or taste especially support the diagnosis of COVID-19)       none  Protocols used: CORONAVIRUS (COVID-19) DIAGNOSED OR SUSPECTED-A-AH

## 2019-11-29 ENCOUNTER — Other Ambulatory Visit: Payer: Self-pay

## 2019-11-29 ENCOUNTER — Encounter: Payer: Self-pay | Admitting: Nurse Practitioner

## 2019-11-29 ENCOUNTER — Ambulatory Visit (INDEPENDENT_AMBULATORY_CARE_PROVIDER_SITE_OTHER): Payer: Medicare HMO | Admitting: Nurse Practitioner

## 2019-11-29 ENCOUNTER — Ambulatory Visit: Payer: Self-pay | Admitting: Nurse Practitioner

## 2019-11-29 DIAGNOSIS — U071 COVID-19: Secondary | ICD-10-CM

## 2019-11-29 NOTE — Assessment & Plan Note (Signed)
No significant symptoms at this time. Have recommended maintaining a 10 day at home quarantine at this time based on current status.  Patient agrees with this POC and is aware to use OTC medications for symptom relief as needed.  They will return in 10 days for follow-up or sooner if worsening.  If any SOB or difficulty breathing is to immediately go to ER.

## 2019-11-29 NOTE — Telephone Encounter (Signed)
Signing encounter °

## 2019-11-29 NOTE — Progress Notes (Signed)
BP (!) 148/88 (BP Location: Left Arm)   Temp 97.7 F (36.5 C) (Oral)    Subjective:    Patient ID: Kelly Poole, female    DOB: 1951-08-29, 68 y.o.   MRN: [id number]  HPI: Kelly Poole is a 68 y.o. female  Chief Complaint  Patient presents with  . COVID    patient states she test postive for COVID on Monday 11/27/19, test was done on 11/25/19    . This visit was completed via telephone due to the restrictions of the COVID-19 pandemic. All issues as above were discussed and addressed but no physical exam was performed. If it was felt that the patient should be evaluated in the office, they were directed there. The patient verbally consented to this visit. Patient was unable to complete an audio/visual visit due to Lack of equipment. Due to the catastrophic nature of the COVID-19 pandemic, this visit was done through audio contact only. . Location of the patient: home . Location of the provider: work . Those involved with this call:  . Provider: Marnee Guarneri, DNP . CMA: Yvonna Alanis, CMA . Front Desk/Registration: Jill Side  . Time spent on call: 15 minutes on the phone discussing health concerns. 10 minutes total spent in review of patient's record and preparation of their chart.  . I verified patient identity using two factors (patient name and date of birth). Patient consents verbally to being seen via telemedicine visit today.    COVID POSITIVE: Tested positive at her church on Saturday, found out results on Sunday.  Has not had any SOB, CP, loss of taste/smell, cough, GI symptoms, fever.  States no symptoms, except for occasional congestion.  Educated at [employer] on self quarantine guidelines and OTC medications for symptom relief. Fever: no Cough: no Shortness of breath: no Wheezing: no Chest pain:no Chest tightness: no Chest congestion: no Nasal congestion: occasional Runny nose: no Post nasal drip: no Sneezing: no Sore throat: no Swollen glands: no  Sinus pressure: no Headache: no Face pain: no Toothache: no Ear pain: none Ear pressure: none Eyes red/itching:no Eye drainage/crusting: no  Vomiting: no Rash: no Fatigue: no   Relevant past medical, surgical, family and social history reviewed and updated as indicated. Interim medical history since our last visit reviewed. Allergies and medications reviewed and updated.  Review of Systems  Constitutional: Negative for activity change, appetite change, diaphoresis, fatigue and fever.  HENT: Positive for congestion. Negative for facial swelling, postnasal drip, rhinorrhea, sinus pressure, sinus pain, sneezing, sore throat and voice change.   Respiratory: Negative for cough, chest tightness and shortness of breath.   Cardiovascular: Negative for chest pain, palpitations and leg swelling.  Gastrointestinal: Negative for abdominal distention, abdominal pain, constipation, diarrhea, nausea and vomiting.  Endocrine: Negative for cold intolerance and heat intolerance.  Neurological: Negative for dizziness, syncope, weakness, light-headedness, numbness and headaches.  Psychiatric/Behavioral: Negative.     Per HPI unless specifically indicated above     Objective:    BP (!) 148/88 (BP Location: Left Arm)   Temp 97.7 F (36.5 C) (Oral)   Wt Readings from Last 3 Encounters:  10/12/19 147 lb 8.3 oz (66.9 kg)  08/24/19 146 lb (66.2 kg)  07/14/19 140 lb (63.5 kg)    Physical Exam   Unable to perform due to telephone visit only  Results for orders placed or performed in visit on 07/14/19  Microscopic Examination   URINE  Result Value Ref Range   WBC, UA 6-10 (A) 0 -  5 /hpf   RBC None seen 0 - 2 /hpf   Epithelial Cells (non renal) 0-10 0 - 10 /hpf   Bacteria, UA Few (A) None seen/Few  Urine Culture, Reflex   URINE  Result Value Ref Range   Urine Culture, Routine Final report    Organism ID, Bacteria Comment   UA/M w/rflx Culture, Routine   Specimen: Urine   URINE  Result  Value Ref Range   Specific Gravity, UA 1.015 1.005 - 1.030   pH, UA 6.0 5.0 - 7.5   Color, UA Yellow Yellow   Appearance Ur Clear Clear   Leukocytes,UA 3+ (A) Negative   Protein,UA Negative Negative/Trace   Glucose, UA Negative Negative   Ketones, UA Negative Negative   RBC, UA Negative Negative   Bilirubin, UA Negative Negative   Urobilinogen, Ur 0.2 0.2 - 1.0 mg/dL   Nitrite, UA Negative Negative   Microscopic Examination See below:    Urinalysis Reflex Comment       Assessment & Plan:   Problem List Items Addressed This Visit      Other   Lab test positive for detection of COVID-19 virus    No significant symptoms at this time. Have recommended maintaining a 10 day at home quarantine at this time based on current status.  Patient agrees with this POC and is aware to use OTC medications for symptom relief as needed.  They will return in 10 days for follow-up or sooner if worsening.  If any SOB or difficulty breathing is to immediately go to ER.        I discussed the assessment and treatment plan with the patient. The patient was provided an opportunity to ask questions and all were answered. The patient agreed with the plan and demonstrated an understanding of the instructions.   The patient was advised to call back or seek an in-person evaluation if the symptoms worsen or if the condition fails to improve as anticipated.   I provided 15 minutes of time during this encounter.   Follow up plan: Return in about 10 days (around 12/09/2019) for Covid follow-up.

## 2019-11-29 NOTE — Patient Instructions (Signed)

## 2019-11-29 NOTE — Telephone Encounter (Signed)
  Pt questioning if she can wash her hair since she has covid. Advised she could wash her hair. No further questions at this time. States she is "Feeling good, no symptoms." Reason for Disposition . General information question, no triage required and triager able to answer question  Answer Assessment - Initial Assessment Questions 1. REASON FOR CALL or QUESTION: "What is your reason for calling today?" or "How can I best help you?" or "What question do you have that I can help answer?"     Covid question  Protocols used: [place]

## 2019-11-30 ENCOUNTER — Telehealth: Payer: Self-pay | Admitting: Nurse Practitioner

## 2019-11-30 NOTE — Progress Notes (Signed)
LVM to make 10 day covid fu appointment

## 2019-11-30 NOTE — Telephone Encounter (Signed)
Copied from [place] [id number]. Topic: General - Other >> Nov 30, 2019 11:55 AM Lennox Solders wrote: Reason for CRM:per last ov note patient need to follow up with jolene around 12-09-2019. Pt has follow up with rachel on 12-08-2019. Jolene does not have avail slot. Pt said jolene will call her on 12-05-2019 and will give her a work note to go back to work. She can fax work note to attn [facility] [phone]

## 2019-11-30 NOTE — Telephone Encounter (Signed)
Have set reminder for work note.

## 2019-12-08 ENCOUNTER — Telehealth: Payer: Self-pay

## 2019-12-08 ENCOUNTER — Other Ambulatory Visit: Payer: Self-pay

## 2019-12-08 ENCOUNTER — Ambulatory Visit (INDEPENDENT_AMBULATORY_CARE_PROVIDER_SITE_OTHER): Payer: Medicare HMO | Admitting: Family Medicine

## 2019-12-08 ENCOUNTER — Encounter: Payer: Self-pay | Admitting: Family Medicine

## 2019-12-08 VITALS — Temp 97.9°F | Wt 140.0 lb

## 2019-12-08 DIAGNOSIS — U071 COVID-19: Secondary | ICD-10-CM

## 2019-12-08 NOTE — Progress Notes (Signed)
Temp 97.9 F (36.6 C) (Temporal)   Wt 140 lb (63.5 kg)   BMI 30.83 kg/m    Subjective:    Patient ID: Kelly Poole, female    DOB: Apr 02, 1951, 68 y.o.   MRN: [id number]  HPI: Kelly Poole is a 68 y.o. female  Chief Complaint  Patient presents with  . Follow-up    had a positive covid 19. patient states she needs a work note    . This visit was completed via telephone due to the restrictions of the COVID-19 pandemic. All issues as above were discussed and addressed. Physical exam was done as above through visual confirmation on telephone. If it was felt that the patient should be evaluated in the office, they were directed there. The patient verbally consented to this visit. . Location of the patient: home . Location of the provider: work . Those involved with this call:  . Provider: Merrie Roof, PA-C . CMA: Lesle Chris, [place] . Front Desk/Registration: Jill Side  . Time spent on call: 15 minutes on the phone discussing health concerns. 5 minutes total spent in review of patient's record and preparation of their chart. I verified patient identity using two factors (patient name and date of birth). Patient consents verbally to being seen via telemedicine visit today.   Patient presenting today for COVID 19 f/u. 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 Has remained asymptomatic throughout course and her 14 day isolation is up tomorrow. Requesting return to work note. Family members in the home all tested negative.   Relevant past medical, surgical, family and social history reviewed and updated as indicated. Interim medical history since our last visit reviewed. Allergies and medications reviewed and updated.  Review of Systems  Per HPI unless specifically indicated above     Objective:    Temp 97.9 F (36.6 C) (Temporal)   Wt 140 lb (63.5 kg)   BMI 30.83 kg/m   Wt Readings from Last 3 Encounters:  12/08/19  140 lb (63.5 kg)  10/12/19 147 lb 8.3 oz (66.9 kg)  08/24/19 146 lb (66.2 kg)    Physical Exam  Unable to perform PE due to patient lack of access to video technology for today's visit.   Results for orders placed or performed in visit on 07/14/19  Microscopic Examination   URINE  Result Value Ref Range   WBC, UA 6-10 (A) 0 - 5 /hpf   RBC None seen 0 - 2 /hpf   Epithelial Cells (non renal) 0-10 0 - 10 /hpf   Bacteria, UA Few (A) None seen/Few  Urine Culture, Reflex   URINE  Result Value Ref Range   Urine Culture, Routine Final report    Organism ID, Bacteria Comment   UA/M w/rflx Culture, Routine   Specimen: Urine   URINE  Result Value Ref Range   Specific Gravity, UA 1.015 1.005 - 1.030   pH, UA 6.0 5.0 - 7.5   Color, UA Yellow Yellow   Appearance Ur Clear Clear   Leukocytes,UA 3+ (A) Negative   Protein,UA Negative Negative/Trace   Glucose, UA Negative Negative   Ketones, UA Negative Negative   RBC, UA Negative Negative   Bilirubin, UA Negative Negative   Urobilinogen, Ur 0.2 0.2 - 1.0 mg/dL   Nitrite, UA Negative Negative   Microscopic Examination See below:    Urinalysis Reflex Comment       Assessment & Plan:   Problem List  Items Addressed This Visit    None    Visit Diagnoses    COVID-19 virus infection    -  Primary   Remained asymptomatic throughout quarantine. Will release back to work without restrictions starting Monday. Letter faxed to her job per her request       Follow up plan: Return if symptoms worsen or fail to improve.

## 2019-12-08 NOTE — Telephone Encounter (Signed)
Printed out letter and faxed over to [facility]

## 2019-12-27 ENCOUNTER — Ambulatory Visit: Payer: BLUE CROSS/BLUE SHIELD | Attending: Internal Medicine

## 2019-12-27 ENCOUNTER — Other Ambulatory Visit: Payer: BLUE CROSS/BLUE SHIELD

## 2019-12-27 DIAGNOSIS — Z20822 Contact with and (suspected) exposure to covid-19: Secondary | ICD-10-CM | POA: Diagnosis not present

## 2019-12-28 ENCOUNTER — Telehealth: Payer: Self-pay

## 2019-12-28 ENCOUNTER — Telehealth: Payer: Self-pay | Admitting: *Deleted

## 2019-12-28 NOTE — Telephone Encounter (Signed)
Caller advise result not back yet 

## 2019-12-28 NOTE — Telephone Encounter (Signed)
Patient called for results ,still pending . 

## 2019-12-29 ENCOUNTER — Encounter: Payer: Self-pay | Admitting: Nurse Practitioner

## 2019-12-29 LAB — NOVEL CORONAVIRUS, NAA: SARS-CoV-2, NAA: NOT DETECTED

## 2020-01-18 ENCOUNTER — Encounter: Payer: Medicare HMO | Admitting: Nurse Practitioner

## 2020-01-19 ENCOUNTER — Ambulatory Visit: Payer: Self-pay | Admitting: *Deleted

## 2020-01-19 NOTE — Telephone Encounter (Signed)
Patient is calling to report she has had blisters that appear in her hairline- not painful/itching. They have been coming and going for about 1 month. Reason for Disposition . Localized rash present > 7 days  Answer Assessment - Initial Assessment Questions 1. APPEARANCE of RASH: "Describe the rash."      Water blister on head- front of head 2. LOCATION: "Where is the rash located?"      Hairline at front- 1-2 blisters 3. NUMBER: "How many spots are there?"      1-2 blisters 4. SIZE: "How big are the spots?" (Inches, centimeters or compare to size of a coin)      Small in size- comes and goes 5. ONSET: "When did the rash start?"      1 month- comes and goes 6. ITCHING: "Does the rash itch?" If so, ask: "How bad is the itch?"  (Scale 1-10; or mild, moderate, severe)     No itching 7. PAIN: "Does the rash hurt?" If so, ask: "How bad is the pain?"  (Scale 1-10; or mild, moderate, severe)     No pain 8. OTHER SYMPTOMS: "Do you have any other symptoms?" (e.g., fever)     no 9. PREGNANCY: "Is there any chance you are pregnant?" "When was your last menstrual period?"     n/a  Protocols used: RASH OR REDNESS - LOCALIZED-A-AH

## 2020-01-19 NOTE — Telephone Encounter (Signed)
Summary: blisters   Pt states that she has blisters on her temple and wants to know the best way to treat. States that if she pops the blisters liquid comes out but no smell.      Attempted to call patient- left message to call back.

## 2020-01-19 NOTE — Telephone Encounter (Signed)
Will need to schedule in office for next week and if worse over weekend needs to go to [facility].

## 2020-01-22 ENCOUNTER — Other Ambulatory Visit: Payer: Self-pay

## 2020-01-22 ENCOUNTER — Encounter: Payer: Self-pay | Admitting: Nurse Practitioner

## 2020-01-22 ENCOUNTER — Ambulatory Visit (INDEPENDENT_AMBULATORY_CARE_PROVIDER_SITE_OTHER): Payer: Medicare HMO | Admitting: Nurse Practitioner

## 2020-01-22 VITALS — BP 143/85 | HR 82 | Temp 98.8°F

## 2020-01-22 DIAGNOSIS — S0002XA Blister (nonthermal) of scalp, initial encounter: Secondary | ICD-10-CM

## 2020-01-22 NOTE — Assessment & Plan Note (Signed)
Acute and improved, none present in a few weeks.  Suspect related to tight braiding causing some folliculitis.  Recommend if blisters return to immediately see provider during acute phase for assessment.  None present on exam today.  Return for worsening or continued issues.

## 2020-01-22 NOTE — Progress Notes (Signed)
BP (!) 143/85   Pulse 82   Temp 98.8 F (37.1 C) (Oral)   SpO2 98%    Subjective:    Patient ID: Kelly Poole, female    DOB: [birth year], 69 y.o.   MRN: [id number]  HPI: Kelly Poole is a 69 y.o. female  Chief Complaint  Patient presents with  . Hair/Scalp Problem    pt states she has been having blisters on her scalp. Has not had any in the last few weeks per patient. States they only come up in the front of her scalp and do not itch.    SKIN LESION Reports intermittent blisters to scalp line, none present today or in past few weeks. They do not itch, cause pain/burning, or discomfort.  She will "bust" them and they go away and turn into scab, then this goes away.  Had braids in her hair to place new hair, which was tight at time.  Every time the braids are too tight these show up. Duration: on and off with braiding Location: scalp line Painful: no Itching: no Onset: gradual Context: improved Associated signs and symptoms:  History of skin cancer: no History of precancerous skin lesions: no Family history of skin cancer: no  Relevant past medical, surgical, family and social history reviewed and updated as indicated. Interim medical history since our last visit reviewed. Allergies and medications reviewed and updated.  Review of Systems  Constitutional: Negative for activity change, appetite change, diaphoresis, fatigue and fever.  Respiratory: Negative for cough, chest tightness and shortness of breath.   Cardiovascular: Negative for chest pain, palpitations and leg swelling.  Psychiatric/Behavioral: Negative.     Per HPI unless specifically indicated above     Objective:    BP (!) 143/85   Pulse 82   Temp 98.8 F (37.1 C) (Oral)   SpO2 98%   Wt Readings from Last 3 Encounters:  12/08/19 140 lb (63.5 kg)  10/12/19 147 lb 8.3 oz (66.9 kg)  08/24/19 146 lb (66.2 kg)    Physical Exam Vitals and nursing note reviewed.  Constitutional:      General:  She is awake. She is not in acute distress.    Appearance: She is well-developed. She is obese. She is not ill-appearing.  HENT:     Head: Normocephalic.     Right Ear: Hearing normal.     Left Ear: Hearing normal.  Eyes:     General: Lids are normal.        Right eye: No discharge.        Left eye: No discharge.     Conjunctiva/sclera: Conjunctivae normal.     Pupils: Pupils are equal, round, and reactive to light.  Neck:     Vascular: No carotid bruit.  Cardiovascular:     Rate and Rhythm: Normal rate and regular rhythm.     Heart sounds: Normal heart sounds. No murmur. No gallop.   Pulmonary:     Effort: Pulmonary effort is normal. No accessory muscle usage or respiratory distress.     Breath sounds: Normal breath sounds.  Abdominal:     General: Bowel sounds are normal.     Palpations: Abdomen is soft.  Musculoskeletal:     Cervical back: Normal range of motion and neck supple.     Right lower leg: No edema.     Left lower leg: No edema.  Skin:    General: Skin is warm and dry.     Findings: No rash.  Comments: Scalp intact with no rashes or blisters.  Neurological:     Mental Status: She is alert and oriented to person, place, and time.  Psychiatric:        Attention and Perception: Attention normal.        Mood and Affect: Mood normal.        Behavior: Behavior normal. Behavior is cooperative.        Thought Content: Thought content normal.        Judgment: Judgment normal.    Results for orders placed or performed in visit on 12/27/19  Novel Coronavirus, NAA ([facility])   Specimen: Nasopharyngeal(NP) swabs in vial transport medium   NASOPHARYNGE  TESTING  Result Value Ref Range   SARS-CoV-2, NAA Not Detected Not Detected      Assessment & Plan:   Problem List Items Addressed This Visit      Musculoskeletal and Integument   Blister of scalp - Primary    Acute and improved, none present in a few weeks.  Suspect related to tight braiding causing some  folliculitis.  Recommend if blisters return to immediately see provider during acute phase for assessment.  None present on exam today.  Return for worsening or continued issues.          Follow up plan: Return if symptoms worsen or fail to improve.

## 2020-01-22 NOTE — Patient Instructions (Signed)
We are recommending the vaccine to everyone who has not had an allergic reaction to any of the components of the vaccine. If you have specific questions about the vaccine, please bring them up with your health care provider to discuss them.   We will likely not be getting the vaccine in the office for the first rounds of vaccinations. The way they are releasing the vaccines is going to be through the health systems (like [place], [place], [facility], [place]) or through your county health department.   The [facility] Department is giving vaccines to those 75+ starting 12/27/19  M-F 7AM to 4PM [employer] [place] [address] [place], [place] in a drive through tent  If you are 65+ you can get a vaccine through [facility] by signing up for an appointment.  You can sign up by going to: [email or web address].  You can get more information by going to: [id number]   Folliculitis  Folliculitis is inflammation of the hair follicles. Folliculitis most commonly occurs on the scalp, thighs, legs, back, and buttocks. However, it can occur anywhere on the body. What are the causes? This condition may be caused by:  A bacterial infection (common).  A fungal infection.  A viral infection.  Contact with certain chemicals, especially oils and tars.  Shaving or waxing.  Greasy ointments or creams applied to the skin. Long-lasting folliculitis and folliculitis that keeps coming back may be caused by bacteria. This bacteria can live anywhere on your skin and is often found in the nostrils. What increases the risk? You are more likely to develop this condition if you have:  A weakened immune system.  Diabetes.  Obesity. What are the signs or symptoms? Symptoms of this condition include:  Redness.  Soreness.  Swelling.  Itching.  Small white or yellow, pus-filled, itchy spots (pustules) that appear over a reddened area. If there is  an infection that goes deep into the follicle, these may develop into a boil (furuncle).  A group of closely packed boils (carbuncle). These tend to form in hairy, sweaty areas of the body. How is this diagnosed? This condition is diagnosed with a skin exam. To find what is causing the condition, your health care provider may take a sample of one of the pustules or boils for testing in a lab. How is this treated? This condition may be treated by:  Applying warm compresses to the affected areas.  Taking an antibiotic medicine or applying an antibiotic medicine to the skin.  Applying or bathing with an antiseptic solution.  Taking an over-the-counter medicine to help with itching.  Having a procedure to drain any pustules or boils. This may be done if a pustule or boil contains a lot of pus or fluid.  Having laser hair removal. This may be done to treat long-lasting folliculitis. Follow these instructions at home: Managing pain and swelling   If directed, apply heat to the affected area as often as told by your health care provider. Use the heat source that your health care provider recommends, such as a moist heat pack or a heating pad. ? Place a towel between your skin and the heat source. ? Leave the heat on for 20-30 minutes. ? Remove the heat if your skin turns bright red. This is especially important if you are unable to feel pain, heat, or cold. You may have a greater risk of getting burned. General instructions  If you were prescribed an antibiotic medicine, take it  or apply it as told by your health care provider. Do not stop using the antibiotic even if your condition improves.  Check the irritated area every day for signs of infection. Check for: ? Redness, swelling, or pain. ? Fluid or blood. ? Warmth. ? Pus or a bad smell.  Do not shave irritated skin.  Take over-the-counter and prescription medicines only as told by your health care provider.  Keep all follow-up  visits as told by your health care provider. This is important. Get help right away if:  You have more redness, swelling, or pain in the affected area.  Red streaks are spreading from the affected area.  You have a fever. Summary  Folliculitis is inflammation of the hair follicles. Folliculitis most commonly occurs on the scalp, thighs, legs, back, and buttocks.  This condition may be treated by taking an antibiotic medicine or applying an antibiotic medicine to the skin, and applying or bathing with an antiseptic solution.  If you were prescribed an antibiotic medicine, take it or apply it as told by your health care provider. Do not stop using the antibiotic even if your condition improves.  Get help right away if you have new or worsening symptoms.  Keep all follow-up visits as told by your health care provider. This is important. This information is not intended to replace advice given to you by your health care provider. Make sure you discuss any questions you have with your health care provider. Document Revised: 07/16/2018 Document Reviewed: 07/16/2018 Elsevier Patient Education  [place].

## 2020-01-24 ENCOUNTER — Telehealth: Payer: Self-pay | Admitting: Nurse Practitioner

## 2020-01-24 ENCOUNTER — Other Ambulatory Visit: Payer: Self-pay | Admitting: Nurse Practitioner

## 2020-01-24 MED ORDER — AMLODIPINE BESYLATE 5 MG PO TABS: 5.0000 mg | ORAL_TABLET | Freq: Every day | ORAL | 3 refills | Status: DC

## 2020-01-24 NOTE — Telephone Encounter (Signed)
Patient notified and verbalized understanding. 

## 2020-01-24 NOTE — Telephone Encounter (Signed)
Patient called in again checking on status of new medication.

## 2020-01-24 NOTE — Telephone Encounter (Signed)
Copied from [place] [phone]. Topic: General - Other >> Jan 24, 2020  4:08 PM Keene Breath wrote: Reason for CRM: Patient would like the doctor or nurse to call regarding the new BP medication that she was prescribed.  Patient is concerned about the side effects.  Please call patient to discuss at [phone]

## 2020-01-24 NOTE — Telephone Encounter (Signed)
Please let patient know I do not suspect this is Lisinopril, as not common finding.  Possibly related to recent tighter braiding to hair, as this can cause damage to follicle and hair loss.  Next visit we will check thyroid.  I have discontinued Lisinopril and sent in Amlodipine, main side effect we sometimes see with this is edema to lower legs.  If present then she is to notify me.  Thank you!!  Return to office if any worsening of hair loss or continued issues.

## 2020-01-24 NOTE — Telephone Encounter (Signed)
Pt states her hair is falling out and she is convinced it is the lisinopril (ZESTRIL) 10 MG tablet that is causing this to happen.  Pt would like Jolene to prescribed another med in place of this.today. pt states this has gradually been happening and now she feels llke she is losing all her hair.. Pt is not going to take this anymore. Please advise

## 2020-01-25 ENCOUNTER — Telehealth: Payer: Self-pay | Admitting: Nurse Practitioner

## 2020-01-25 MED ORDER — LISINOPRIL 10 MG PO TABS: 10.0000 mg | ORAL_TABLET | Freq: Every day | ORAL | 3 refills | Status: DC

## 2020-01-25 NOTE — Chronic Care Management (AMB) (Signed)
  Care Management   Note  01/25/2020 Name: DARITZA FORNI MRN: [id number] DOB: Sep 18, 1951  Raneem HARNOOR SHILLINGTON is a 69 y.o. year old female who is a primary care patient of Cannady, Barbaraann Faster, NP. I reached out to Joslyn Devon by phone today in response to a referral sent by Ms. [place] [facility] plan.    Ms. Fahl was given information about care management services today including:  1. Care management services include personalized support from designated clinical staff supervised by her physician, including individualized plan of care and coordination with other care providers 2. 24/7 contact phone numbers for assistance for urgent and routine care needs. 3. The patient may stop care management services at any time by phone call to the office staff.  Patient agreed to services and verbal consent obtained.   Follow up plan: Telephone appointment with care management team member scheduled for:03/13/2020  Noreene Larsson, [place], [place], [place] [address] Direct Dial: [phone] [email or web address] Website: [email or web address]

## 2020-01-25 NOTE — Telephone Encounter (Signed)
Called and left patient a VM asking for her to please return my call.  

## 2020-01-25 NOTE — Telephone Encounter (Signed)
Patient notified

## 2020-01-25 NOTE — Telephone Encounter (Signed)
Copied from [place] [phone]. Topic: General - Other >> Jan 25, 2020  8:41 AM Kelly Poole A wrote: Patient called to let Rhylei Mcquaig know that she wants to continue with lisinopril and will not be taking the amlodipine and would like lisinopril sent back to pharmacy. Please advise

## 2020-01-26 ENCOUNTER — Encounter: Payer: Self-pay | Admitting: Nurse Practitioner

## 2020-01-26 ENCOUNTER — Other Ambulatory Visit: Payer: Self-pay

## 2020-01-26 ENCOUNTER — Ambulatory Visit (INDEPENDENT_AMBULATORY_CARE_PROVIDER_SITE_OTHER): Payer: Medicare HMO | Admitting: Nurse Practitioner

## 2020-01-26 VITALS — BP 136/76 | HR 76 | Temp 98.3°F

## 2020-01-26 DIAGNOSIS — L659 Nonscarring hair loss, unspecified: Secondary | ICD-10-CM | POA: Diagnosis not present

## 2020-01-26 DIAGNOSIS — S0002XA Blister (nonthermal) of scalp, initial encounter: Secondary | ICD-10-CM | POA: Diagnosis not present

## 2020-01-26 DIAGNOSIS — I10 Essential (primary) hypertension: Secondary | ICD-10-CM

## 2020-01-26 MED ORDER — LOSARTAN POTASSIUM 25 MG PO TABS: 25.0000 mg | ORAL_TABLET | Freq: Every day | ORAL | 3 refills | Status: DC

## 2020-01-26 MED ORDER — MUPIROCIN 2 % EX OINT: 1.0000 "application " | TOPICAL_OINTMENT | Freq: Two times a day (BID) | CUTANEOUS | 0 refills | Status: DC

## 2020-01-26 NOTE — Telephone Encounter (Signed)
Copied from [place] [phone]. Topic: General - Other >> Jan 26, 2020 11:14 AM Celene Kras wrote: Reason for CRM: Pt called stating that she has a blister on her head and that her PCP is requested to have her come into the office without an appt to get it looked at. Pt states she will come by office after 2 today. Please advise.

## 2020-01-26 NOTE — Assessment & Plan Note (Signed)
Chronic, ongoing.  BP initially elevated, but repeat at goal.  Possible hair loss with Lisinopril, has been ongoing since starting this years ago.  Will change to Losartan 25 MG daily, script sent.  Recommend monitor BP a few days a week and document.  Return in 4 weeks.

## 2020-01-26 NOTE — Progress Notes (Signed)
BP 136/76 (BP Location: Left Arm, Patient Position: Sitting)   Pulse 76   Temp 98.3 F (36.8 C) (Oral)   SpO2 98%    Subjective:    Patient ID: Kelly Poole, female    DOB: [birth year], 69 y.o.   MRN: [id number]  HPI: Kelly Poole is a 69 y.o. female  Chief Complaint  Patient presents with  . Blister    pt states she has had a blister come back up in her scalp    SKIN LESION & HAIR LOSS Reports intermittent blisters to scalp line, one present today to left anterior scalp. They do not itch, cause pain/burning, or discomfort.  She will "bust" them and they go away and turn into scab, then this goes away.  Had braids in her hair to place new hair, which was tight at time.  Every time the braids are too tight these show up.  No recent braids, last done one month ago.  Took her braids out recently, human hair braids.  Is concerned because her hair has fallen out with some of this, has noticed hair thinning for awhile.  Is back to taking Biotin.  Feels the hair loss is from her Lisinopril, has been present since starting this medication years ago. Duration: on and off with braiding Location: scalp line Painful: no Itching: no Onset: gradual Context: improved Associated signs and symptoms:  History of skin cancer: no History of precancerous skin lesions: no Family history of skin cancer: no  Relevant past medical, surgical, family and social history reviewed and updated as indicated. Interim medical history since our last visit reviewed. Allergies and medications reviewed and updated.  Review of Systems  Constitutional: Negative for activity change, appetite change, diaphoresis, fatigue and fever.  Respiratory: Negative for cough, chest tightness and shortness of breath.   Cardiovascular: Negative for chest pain, palpitations and leg swelling.  Gastrointestinal: Negative.   Skin:       Blister and hair loss  Neurological: Negative.   Psychiatric/Behavioral: Negative.      Per HPI unless specifically indicated above     Objective:    BP 136/76 (BP Location: Left Arm, Patient Position: Sitting)   Pulse 76   Temp 98.3 F (36.8 C) (Oral)   SpO2 98%   Wt Readings from Last 3 Encounters:  12/08/19 140 lb (63.5 kg)  10/12/19 147 lb 8.3 oz (66.9 kg)  08/24/19 146 lb (66.2 kg)    Physical Exam Vitals and nursing note reviewed.  Constitutional:      General: She is awake. She is not in acute distress.    Appearance: She is well-developed. She is obese. She is not ill-appearing.  HENT:     Head: Normocephalic.      Comments: Mild hair thinning noted to front and mid-scalp.    Right Ear: Hearing normal.     Left Ear: Hearing normal.  Eyes:     General: Lids are normal.        Right eye: No discharge.        Left eye: No discharge.     Conjunctiva/sclera: Conjunctivae normal.     Pupils: Pupils are equal, round, and reactive to light.  Neck:     Thyroid: No thyromegaly.     Vascular: No carotid bruit.  Cardiovascular:     Rate and Rhythm: Normal rate and regular rhythm.     Heart sounds: Normal heart sounds. No murmur. No gallop.   Pulmonary:  Effort: Pulmonary effort is normal. No accessory muscle usage or respiratory distress.     Breath sounds: Normal breath sounds.  Abdominal:     General: Bowel sounds are normal.     Palpations: Abdomen is soft.  Musculoskeletal:     Cervical back: Normal range of motion and neck supple.     Right lower leg: No edema.     Left lower leg: No edema.  Skin:    General: Skin is warm and dry.  Neurological:     Mental Status: She is alert and oriented to person, place, and time.  Psychiatric:        Attention and Perception: Attention normal.        Mood and Affect: Mood normal.        Behavior: Behavior normal. Behavior is cooperative.        Thought Content: Thought content normal.        Judgment: Judgment normal.     Results for orders placed or performed in visit on 12/27/19  Novel  Coronavirus, NAA ([facility])   Specimen: Nasopharyngeal(NP) swabs in vial transport medium   NASOPHARYNGE  TESTING  Result Value Ref Range   SARS-CoV-2, NAA Not Detected Not Detected      Assessment & Plan:   Problem List Items Addressed This Visit      Cardiovascular and Mediastinum   Hypertension    Chronic, ongoing.  BP initially elevated, but repeat at goal.  Possible hair loss with Lisinopril, has been ongoing since starting this years ago.  Will change to Losartan 25 MG daily, script sent.  Recommend monitor BP a few days a week and document.  Return in 4 weeks.      Relevant Medications   losartan (COZAAR) 25 MG tablet     Musculoskeletal and Integument   Blister of scalp - Primary    Acute with one blister present at this time.  Suspect related to tight braiding causing some folliculitis, low suspicion for shingles as no clusters, erythema, or pain/burning to site.  Order for Mupirocin.  Recommend avoid braiding for next 4 weeks and rest scalp.  Return for worsening or continued issues.        Other   Hair loss    Check TSH and ferritin/iron levels today.  Possibly related to Lisinopril, will switch to Losartan, refer to HTN plan.      Relevant Orders   Thyroid Panel With TSH   Iron, TIBC and Ferritin Panel       Follow up plan: Return in about 4 weeks (around 02/23/2020) for HTN.

## 2020-01-26 NOTE — Assessment & Plan Note (Addendum)
Acute with one blister present at this time.  Suspect related to tight braiding causing some folliculitis, low suspicion for shingles as no clusters, erythema, or pain/burning to site.  Order for Mupirocin.  Recommend avoid braiding for next 4 weeks and rest scalp.  Return for worsening or continued issues.

## 2020-01-26 NOTE — Patient Instructions (Signed)
Alopecia Areata, Adult  Alopecia areata is a condition that causes you to lose hair. You may lose hair on your scalp in patches. In some cases, you may lose all the hair on your scalp (alopecia totalis) or all the hair from your face and body (alopecia universalis). Alopecia areata is an autoimmune disease. This means that your body's defense system (immune system) mistakes normal parts of the body for germs or other things that can make you sick. When you have alopecia areata, the immune system attacks the hair follicles. Alopecia areata usually develops in childhood, but it can develop at any age. For some people, their hair grows back on its own and hair loss does not happen again. For others, their hair may fall out and grow back in cycles. The hair loss may last many years. Having this condition can be emotionally difficult, but it is not dangerous. What are the causes? The cause of this condition is not known. What increases the risk? This condition is more likely to develop in people who have:  A family history of alopecia.  A family history of another autoimmune disease, including type 1 diabetes and rheumatoid arthritis.  Asthma and allergies.  Down syndrome. What are the signs or symptoms? Round spots of patchy hair loss on the scalp is the main symptom of this condition. The spots may be mildly itchy. Other symptoms include:  Short dark hairs in the bald patches that are wider at the top (exclamation point hairs).  Dents, white spots, or lines in the fingernails or toenails.  Balding and body hair loss. This is rare. How is this diagnosed? This condition is diagnosed based on your symptoms and family history. Your health care provider will also check your scalp skin, teeth, and nails. Your health care provider may refer you to a specialist in hair and skin disorders (dermatologist). You may also have tests, including:  A hair pull test.  Blood tests or other screening tests  to check for autoimmune diseases, such as thyroid disease or diabetes.  Skin biopsy to confirm the diagnosis.  A procedure to examine the skin with a lighted magnifying instrument (dermoscopy). How is this treated? There is no cure for alopecia areata. Treatment is aimed at promoting the regrowth of hair and preventing the immune system from overreacting. No single treatment is right for all people with alopecia areata. It depends on the type of hair loss you have and how severe it is. Work with your health care provider to find the best treatment for you. Treatment may include:  Having regular checkups to make sure the condition is not getting worse (watchful waiting).  Steroid creams or pills for 6-8 weeks to stop the immune reaction and help hair to regrow more quickly.  Other topical medicines to alter the immune system response and support the hair growth cycle.  Steroid injections.  Therapy and counseling with a support group or therapist if you are having trouble coping with hair loss. Follow these instructions at home:  Learn as much as you can about your condition.  Apply topical creams only as told by your health care provider.  Take over-the-counter and prescription medicines only as told by your health care provider.  Consider getting a wig or products to make hair look fuller or to cover bald spots, if you feel uncomfortable with your appearance.  Get therapy or counseling if you are having a hard time coping with hair loss. Ask your health care provider to recommend  a counselor or support group.  Keep all follow-up visits as told by your health care provider. This is important. Contact a health care provider if:  Your hair loss gets worse, even with treatment.  You have new symptoms.  You are struggling emotionally. Summary  Alopecia areata is an autoimmune condition that makes your body's defense system (immune system) attack the hair follicles. This causes you  to lose hair.  Treatments may include regular checkups to make sure that the condition is not getting worse (watchful waiting), medicines, and steroid injections. This information is not intended to replace advice given to you by your health care provider. Make sure you discuss any questions you have with your health care provider. Document Revised: 11/19/2017 Document Reviewed: 12/25/2016 Elsevier Patient Education  2020 Elsevier Inc.  

## 2020-01-26 NOTE — Assessment & Plan Note (Signed)
Check TSH and ferritin/iron levels today.  Possibly related to Lisinopril, will switch to Losartan, refer to HTN plan.

## 2020-01-26 NOTE — Telephone Encounter (Signed)
Scheduled for today.

## 2020-01-27 LAB — IRON,TIBC AND FERRITIN PANEL
Ferritin: 284 ng/mL — ABNORMAL HIGH (ref 15–150)
Iron Saturation: 35 % (ref 15–55)
Iron: 77 ug/dL (ref 27–139)
Total Iron Binding Capacity: 223 ug/dL — ABNORMAL LOW (ref 250–450)
UIBC: 146 ug/dL (ref 118–369)

## 2020-01-27 LAB — THYROID PANEL WITH TSH
Free Thyroxine Index: 1.9 (ref 1.2–4.9)
T3 Uptake Ratio: 25 % (ref 24–39)
T4, Total: 7.4 ug/dL (ref 4.5–12.0)
TSH: 1.83 u[IU]/mL (ref 0.450–4.500)

## 2020-01-27 NOTE — Progress Notes (Signed)
Contacted via MyChart

## 2020-01-30 ENCOUNTER — Encounter: Payer: Self-pay | Admitting: Nurse Practitioner

## 2020-01-30 ENCOUNTER — Other Ambulatory Visit: Payer: Self-pay

## 2020-01-30 DIAGNOSIS — L659 Nonscarring hair loss, unspecified: Secondary | ICD-10-CM | POA: Diagnosis not present

## 2020-01-30 NOTE — Addendum Note (Signed)
Addended by: Marnee Guarneri T on: 01/30/2020 01:19 PM   Modules accepted: Orders

## 2020-01-31 LAB — SEDIMENTATION RATE: Sed Rate: 32 mm/hr (ref 0–40)

## 2020-01-31 LAB — C-REACTIVE PROTEIN: CRP: 3 mg/L (ref 0–10)

## 2020-01-31 LAB — SPECIMEN STATUS REPORT

## 2020-01-31 NOTE — Progress Notes (Signed)
Contacted via MyChart

## 2020-02-08 ENCOUNTER — Telehealth: Payer: Self-pay | Admitting: Nurse Practitioner

## 2020-02-08 NOTE — Telephone Encounter (Signed)
Okay for [facility] to relay information if patient calls back.

## 2020-02-08 NOTE — Telephone Encounter (Signed)
Still unable to reach pt. Messages have been left for pt to call back.

## 2020-02-08 NOTE — Telephone Encounter (Signed)
Copied from [place] [id number]. Topic: General - Inquiry >> Feb 08, 2020 12:28 PM Alanda Slim E wrote: Reason for CRM: Pt would like to speak with nurse or Ted Leonhart about if they recommend her getting the covid vaccine/ please advise

## 2020-02-08 NOTE — Telephone Encounter (Signed)
Patient notified of response, she states that the only thing she is allergic to is the antibiotic listed in her chart, I informed her that the vaccine does not contain an antibiotic.

## 2020-02-08 NOTE — Telephone Encounter (Signed)
LVM for patient to return phone call.  

## 2020-02-08 NOTE — Telephone Encounter (Signed)
Patient called back- patient was not on the line when call transferred. Attempted to call patient back and got voice mail- left message to call back.

## 2020-02-08 NOTE — Telephone Encounter (Signed)
Attempted to call pt back, no answer. Left message for patient to call back.

## 2020-02-09 ENCOUNTER — Telehealth: Payer: Self-pay | Admitting: Nurse Practitioner

## 2020-02-09 NOTE — Telephone Encounter (Signed)
Attempted to contact patient. Left VM to return call to office for information on the COVID-19 vaccine.

## 2020-02-09 NOTE — Telephone Encounter (Signed)
Patient called in stating she already spoke with nurse.

## 2020-02-12 ENCOUNTER — Other Ambulatory Visit: Payer: Self-pay | Admitting: Family Medicine

## 2020-02-12 ENCOUNTER — Ambulatory Visit: Payer: BLUE CROSS/BLUE SHIELD | Attending: Internal Medicine

## 2020-02-12 ENCOUNTER — Other Ambulatory Visit: Payer: Self-pay | Admitting: Nurse Practitioner

## 2020-02-12 DIAGNOSIS — Z23 Encounter for immunization: Secondary | ICD-10-CM | POA: Insufficient documentation

## 2020-02-12 NOTE — Telephone Encounter (Signed)
Requested Prescriptions  Pending Prescriptions Disp Refills  . atorvastatin (LIPITOR) 10 MG tablet [Pharmacy Med Name: Atorvastatin Calcium 10 MG Oral Tablet] 90 tablet 0    Sig: Take 1 tablet by mouth once daily     Cardiovascular:  Antilipid - Statins Failed - 02/12/2020 11:05 AM      Failed - Total Cholesterol in normal range and within 360 days    Cholesterol, Total  Date Value Ref Range Status  08/23/2018 165 100 - 199 mg/dL Final   Cholesterol Piccolo, Waived  Date Value Ref Range Status  06/03/2015 178 <200 mg/dL Final    Comment:                            Desirable                <200                         Borderline High      200- 239                         High                     >239          Failed - LDL in normal range and within 360 days    LDL Calculated  Date Value Ref Range Status  08/23/2018 89 0 - 99 mg/dL Final         Failed - HDL in normal range and within 360 days    HDL  Date Value Ref Range Status  08/23/2018 58 >39 mg/dL Final         Failed - Triglycerides in normal range and within 360 days    Triglycerides  Date Value Ref Range Status  08/23/2018 92 0 - 149 mg/dL Final   Triglycerides Piccolo,Waived  Date Value Ref Range Status  06/03/2015 93 <150 mg/dL Final    Comment:                            Normal                   <150                         Borderline High     150 - 199                         High                200 - 499                         Very High                >499          Passed - Patient is not pregnant      Passed - Valid encounter within last 12 months    Recent Outpatient Visits          2 weeks ago Blister of scalp   [place], Barbaraann Faster, NP   3 weeks ago Blister of scalp   [place], Barbaraann Faster, NP  2 months ago COVID-19 virus infection   [place], [place]   2 months ago Lab test positive for detection of COVID-19  virus   [facility] [place], [place] T, NP   4 months ago Acute pain of right knee   [facility], Barbaraann Faster, NP      Future Appointments            In 8 months [facility], [facility]

## 2020-02-12 NOTE — Progress Notes (Signed)
   Covid-19 Vaccination Clinic  Name:  Kelly Poole    MRN: [id number] DOB: 05-Sep-1951  02/12/2020  [name] was observed post Covid-19 immunization for 15 minutes without incidence. She was provided with Vaccine Information Sheet and instruction to access the V-Safe system.   [name] was instructed to call 911 with any severe reactions post vaccine: Marland Kitchen Difficulty breathing  . Swelling of your face and throat  . A fast heartbeat  . A bad rash all over your body  . Dizziness and weakness    Immunizations Administered    Name Date Dose VIS Date Route   Moderna COVID-19 Vaccine 02/12/2020  4:14 PM 0.5 mL 11/21/2019 Intramuscular   Manufacturer: Moderna   Lot: [id number]   [id number]

## 2020-02-16 ENCOUNTER — Encounter: Payer: Self-pay | Admitting: Nurse Practitioner

## 2020-02-16 ENCOUNTER — Other Ambulatory Visit: Payer: Self-pay

## 2020-02-16 ENCOUNTER — Ambulatory Visit (INDEPENDENT_AMBULATORY_CARE_PROVIDER_SITE_OTHER): Payer: Medicare HMO | Admitting: Nurse Practitioner

## 2020-02-16 VITALS — BP 163/94 | HR 83 | Temp 98.7°F | Ht <= 58 in | Wt 147.2 lb

## 2020-02-16 DIAGNOSIS — Z0000 Encounter for general adult medical examination without abnormal findings: Secondary | ICD-10-CM | POA: Diagnosis not present

## 2020-02-16 DIAGNOSIS — R7989 Other specified abnormal findings of blood chemistry: Secondary | ICD-10-CM | POA: Diagnosis not present

## 2020-02-16 DIAGNOSIS — E559 Vitamin D deficiency, unspecified: Secondary | ICD-10-CM

## 2020-02-16 DIAGNOSIS — N1831 Chronic kidney disease, stage 3a: Secondary | ICD-10-CM | POA: Diagnosis not present

## 2020-02-16 DIAGNOSIS — I10 Essential (primary) hypertension: Secondary | ICD-10-CM | POA: Diagnosis not present

## 2020-02-16 DIAGNOSIS — Z853 Personal history of malignant neoplasm of breast: Secondary | ICD-10-CM | POA: Diagnosis not present

## 2020-02-16 DIAGNOSIS — E7800 Pure hypercholesterolemia, unspecified: Secondary | ICD-10-CM

## 2020-02-16 DIAGNOSIS — Z780 Asymptomatic menopausal state: Secondary | ICD-10-CM

## 2020-02-16 DIAGNOSIS — D518 Other vitamin B12 deficiency anemias: Secondary | ICD-10-CM | POA: Diagnosis not present

## 2020-02-16 DIAGNOSIS — K219 Gastro-esophageal reflux disease without esophagitis: Secondary | ICD-10-CM

## 2020-02-16 DIAGNOSIS — E049 Nontoxic goiter, unspecified: Secondary | ICD-10-CM | POA: Diagnosis not present

## 2020-02-16 MED ORDER — LOSARTAN POTASSIUM 50 MG PO TABS: 50.0000 mg | ORAL_TABLET | Freq: Every day | ORAL | 3 refills | Status: DC

## 2020-02-16 NOTE — Assessment & Plan Note (Signed)
Ongoing, CKD 3.  Continue Losartan for kidney protection.  CMP today.  Refer to nephrology as needed for worsening kidney function. 

## 2020-02-16 NOTE — Assessment & Plan Note (Signed)
Chronic, ongoing.  Continue current medication regimen and adjust as needed. Lipid panel today. 

## 2020-02-16 NOTE — Assessment & Plan Note (Signed)
Chronic, stable on Omeprazole.  Reports return of symptoms without medication, when has trialed GDR in past.  Continue current dose.  Mag level today.

## 2020-02-16 NOTE — Assessment & Plan Note (Signed)
Chronic, stable.  Continue daily B12 supplement. Check B12 level today and adjust supplement as needed. 

## 2020-02-16 NOTE — Assessment & Plan Note (Signed)
History of reported by patient.  Check TSH today.

## 2020-02-16 NOTE — Patient Instructions (Signed)
[facility]  Address: [address], [place], [place] [address]  Phone: [phone]   Bone Density Test The bone density test uses a special type of X-ray to measure the amount of calcium and other minerals in your bones. It can measure bone density in the hip and the spine. The test procedure is similar to having a regular X-ray. This test may also be called:  Bone densitometry.  Bone mineral density test.  Dual-energy X-ray absorptiometry (DEXA). You may have this test to:  Diagnose a condition that causes weak or thin bones (osteoporosis).  Screen you for osteoporosis.  Predict your risk for a broken bone (fracture).  Determine how well your osteoporosis treatment is working. Tell a health care provider about:  Any allergies you have.  All medicines you are taking, including vitamins, herbs, eye drops, creams, and over-the-counter medicines.  Any problems you or family members have had with anesthetic medicines.  Any blood disorders you have.  Any surgeries you have had.  Any medical conditions you have.  Whether you are pregnant or may be pregnant.  Any medical tests you have had within the past 14 days that used contrast material. What are the risks? Generally, this is a safe procedure. However, it does expose you to a small amount of radiation, which can slightly increase your cancer risk. What happens before the procedure?  Do not take any calcium supplements starting 24 hours before your test.  Remove all metal jewelry, eyeglasses, dental appliances, and any other metal objects. What happens during the procedure?   You will lie down on an exam table. There will be an X-ray generator below you and an imaging device above you.  Other devices, such as boxes or braces, may be used to position your body properly for the scan.  The machine will slowly scan your body. You will need to keep still.  The images will show up  on a screen in the room. Images will be examined by a specialist after your test is done. The procedure may vary among health care providers and hospitals. What happens after the procedure?  It is up to you to get your test results. Ask your health care provider, or the department that is doing the test, when your results will be ready. Summary  A bone density test is an imaging test that uses a type of X-ray to measure the amount of calcium and other minerals in your bones.  The test may be used to diagnose or screen you for a condition that causes weak or thin bones (osteoporosis), predict your risk for a broken bone (fracture), or determine how well your osteoporosis treatment is working.  Do not take any calcium supplements starting 24 hours before your test.  Ask your health care provider, or the department that is doing the test, when your results will be ready. This information is not intended to replace advice given to you by your health care provider. Make sure you discuss any questions you have with your health care provider. Document Revised: 12/23/2017 Document Reviewed: 10/11/2017 Elsevier Patient Education  [place].

## 2020-02-16 NOTE — Assessment & Plan Note (Addendum)
Chronic, ongoing.  BP elevated today.  Will increase Losartan to 50 MG daily, script sent, recommend she start taking two of her 25 MG tablets until complete then pick up new script.  Recommend monitor BP a few days a week at home and document.  CMP today.  Return in 4 weeks.

## 2020-02-16 NOTE — Progress Notes (Signed)
BP (!) 163/94   Pulse 83   Temp 98.7 F (37.1 C) (Oral)   Ht 4' 9"  (1.448 m)   Wt 147 lb 3.2 oz (66.8 kg)   SpO2 100%   BMI 31.85 kg/m    Subjective:    Patient ID: Kelly Poole, female    DOB: 1951/05/23, 69 y.o.   MRN: [id number]  HPI: Kelly Poole is a 69 y.o. female presenting on 02/16/2020 for comprehensive medical examination. Current medical complaints include:none  She currently lives with: self Menopausal Symptoms: no   HYPERTENSION / HYPERLIPIDEMIA Continues on Losartan 25 MG daily and Atorvastatin for HLD.  Switched to Losartan due to patient concern with Lisinopril.  Reports some elevations recently due to her boyfriend of 20 years breaking up with her. Satisfied with current treatment? yes Duration of hypertension: chronic BP monitoring frequency: not checking BP range:  BP medication side effects: no Duration of hyperlipidemia: chronic Cholesterol medication side effects: no Cholesterol supplements: none Medication compliance: good compliance Aspirin: no Recent stressors: no Recurrent headaches: no Visual changes: no Palpitations: no Dyspnea: no Chest pain: no Lower extremity edema: no Dizzy/lightheaded: no   GERD Continues on Prilosec 20 MG daily. GERD control status: stable  Satisfied with current treatment? yes Heartburn frequency: none Medication side effects: no  Medication compliance: stable Previous GERD medications: OTC medications Antacid use frequency:  none Dysphagia: no Odynophagia:  no Hematemesis: no Blood in stool: no EGD: no  CHRONIC KIDNEY DISEASE January 2020 labs with CRT 1.12 and GFR 52. CKD status: stable Medications renally dose: no Previous renal evaluation: no Pneumovax:  refused Influenza Vaccine:  refused  VITAMIN B12 DEFICIENCY: Continues on daily supplement.  No current level on chart.  Takes daily Vitamin D supplement too, no recent falls or fractures.  Is due to DEXA, she would like to obtain  this.  Depression Screen done today and results listed below:  Depression screen [facility] 2/9 02/16/2020 10/12/2019 09/22/2019 08/23/2018 08/09/2017  Decreased Interest 0 0 0 0 0  Down, Depressed, Hopeless 0 0 0 0 0  PHQ - 2 Score 0 0 0 0 0  Altered sleeping - - - 3 -  Tired, decreased energy - - - 2 -  Change in appetite - - - 2 -  Feeling bad or failure about yourself  - - - 0 -  Trouble concentrating - - - 0 -  Moving slowly or fidgety/restless - - - 0 -  Suicidal thoughts - - - 0 -  PHQ-9 Score - - - 7 -    The patient does not have a history of falls. I did not complete a risk assessment for falls. A plan of care for falls was not documented.   Past Medical History:  Past Medical History:  Diagnosis Date  . Breast cancer ([place]) 1991   left breast mastectomy. No chemo or rad tx  . GERD (gastroesophageal reflux disease)   . Heart murmur   . High cholesterol   . Hypertension     Surgical History:  Past Surgical History:  Procedure Laterality Date  . ABDOMINAL HYSTERECTOMY  1978  . MASTECTOMY Left 1991   Due to breast cancer  . TUBAL LIGATION      Medications:  Current Outpatient Medications on File Prior to Visit  Medication Sig  . Ascorbic Acid (VITAMIN C) 1000 MG tablet Take 1,000 mg by mouth daily.  Marland Kitchen aspirin 81 MG tablet Take 81 mg by mouth daily.  Marland Kitchen atorvastatin (  LIPITOR) 10 MG tablet Take 1 tablet by mouth once daily  . Biotin 5 MG TABS Take 2 tablets by mouth daily.  . Calcium Carb-Cholecalciferol (CALCIUM 600+D3 PO) Take 1 tablet by mouth daily.  . Cetirizine HCl (ALLERGY RELIEF) 10 MG CAPS Take by mouth.  . ferrous sulfate 325 (65 FE) MG tablet Take 325 mg by mouth daily with breakfast.  . Garlic 8315 MG CAPS Take 1,000 mg by mouth daily.  . meloxicam (MOBIC) 7.5 MG tablet Take 7.5 mg by mouth daily.  . Multiple Vitamin (MULTIVITAMINS PO) Take by mouth daily.  . mupirocin ointment (BACTROBAN) 2 % Place 1 application into the nose 2 (two) times daily.  Marland Kitchen  omeprazole (PRILOSEC) 20 MG capsule Take 1 capsule by mouth once daily  . vitamin B-12 (CYANOCOBALAMIN) 1000 MCG tablet Take 1,000 mcg by mouth daily.  . vitamin E 100 UNIT capsule Take 100 Units by mouth daily.   No current facility-administered medications on file prior to visit.    Allergies:  Allergies  Allergen Reactions  . Macrobid [Nitrofurantoin Macrocrystal] Other (See Comments)    Sore throat and irritation to throat    Social History:  Social History   Socioeconomic History  . Marital status: Single    Spouse name: Not on file  . Number of children: Not on file  . Years of education: Not on file  . Highest education level: Not on file  Occupational History  . Not on file  Tobacco Use  . Smoking status: Never Smoker  . Smokeless tobacco: Never Used  Substance and Sexual Activity  . Alcohol use: No    Alcohol/week: 0.0 standard drinks  . Drug use: No  . Sexual activity: Yes  Other Topics Concern  . Not on file  Social History Narrative  . Not on file   Social Determinants of Health   Financial Resource Strain: Low Risk   . Difficulty of Paying Living Expenses: Not hard at all  Food Insecurity: No Food Insecurity  . Worried About Charity fundraiser in the Last Year: Never true  . Ran Out of Food in the Last Year: Never true  Transportation Needs: No Transportation Needs  . Lack of Transportation (Medical): No  . Lack of Transportation (Non-Medical): No  Physical Activity: Inactive  . Days of Exercise per Week: 0 days  . Minutes of Exercise per Session: 0 min  Stress: No Stress Concern Present  . Feeling of Stress : Not at all  Social Connections: Slightly Isolated  . Frequency of Communication with Friends and Family: More than three times a week  . Frequency of Social Gatherings with Friends and Family: More than three times a week  . Attends Religious Services: More than 4 times per year  . Active Member of Clubs or Organizations: No  . Attends  Archivist Meetings: Never  . Marital Status: Married  Human resources officer Violence: Not At Risk  . Fear of Current or Ex-Partner: No  . Emotionally Abused: No  . Physically Abused: No  . Sexually Abused: No   Social History   Tobacco Use  Smoking Status Never Smoker  Smokeless Tobacco Never Used   Social History   Substance and Sexual Activity  Alcohol Use No  . Alcohol/week: 0.0 standard drinks    Family History:  Family History  Problem Relation Age of Onset  . Stroke Mother   . Thyroid disease Mother   . Cancer Father   . Diabetes Maternal Aunt   .  Breast cancer Other     Past medical history, surgical history, medications, allergies, family history and social history reviewed with patient today and changes made to appropriate areas of the chart.   Review of Systems - negative All other ROS negative except what is listed above and in the HPI.      Objective:    BP (!) 163/94   Pulse 83   Temp 98.7 F (37.1 C) (Oral)   Ht 4' 9"  (1.448 m)   Wt 147 lb 3.2 oz (66.8 kg)   SpO2 100%   BMI 31.85 kg/m   Wt Readings from Last 3 Encounters:  02/16/20 147 lb 3.2 oz (66.8 kg)  12/08/19 140 lb (63.5 kg)  10/12/19 147 lb 8.3 oz (66.9 kg)    Physical Exam Constitutional:      General: She is awake. She is not in acute distress.    Appearance: She is well-developed. She is not ill-appearing.  HENT:     Head: Normocephalic and atraumatic.     Right Ear: Hearing, tympanic membrane, ear canal and external ear normal. No drainage.     Left Ear: Hearing, tympanic membrane, ear canal and external ear normal. No drainage.     Nose: Nose normal.     Right Sinus: No maxillary sinus tenderness or frontal sinus tenderness.     Left Sinus: No maxillary sinus tenderness or frontal sinus tenderness.     Mouth/Throat:     Mouth: Mucous membranes are moist.     Pharynx: Oropharynx is clear. Uvula midline. No pharyngeal swelling, oropharyngeal exudate or posterior  oropharyngeal erythema.  Eyes:     General: Lids are normal.        Right eye: No discharge.        Left eye: No discharge.     Extraocular Movements: Extraocular movements intact.     Conjunctiva/sclera: Conjunctivae normal.     Pupils: Pupils are equal, round, and reactive to light.     Visual Fields: Right eye visual fields normal and left eye visual fields normal.  Neck:     Thyroid: No thyromegaly.     Vascular: No carotid bruit.     Trachea: Trachea normal.  Cardiovascular:     Rate and Rhythm: Normal rate and regular rhythm.     Heart sounds: Normal heart sounds. No murmur. No gallop.   Pulmonary:     Effort: Pulmonary effort is normal. No accessory muscle usage or respiratory distress.     Breath sounds: Normal breath sounds.  Chest:     Breasts:        Right: Normal.        Left: Absent.  Abdominal:     General: Bowel sounds are normal.     Palpations: Abdomen is soft. There is no hepatomegaly or splenomegaly.     Tenderness: There is no abdominal tenderness.  Musculoskeletal:        General: Normal range of motion.     Cervical back: Normal range of motion and neck supple.     Right lower leg: No edema.     Left lower leg: No edema.  Lymphadenopathy:     Head:     Right side of head: No submental, submandibular, tonsillar, preauricular or posterior auricular adenopathy.     Left side of head: No submental, submandibular, tonsillar, preauricular or posterior auricular adenopathy.     Cervical: No cervical adenopathy.     Upper Body:     Right upper body:  No supraclavicular, axillary or pectoral adenopathy.  Skin:    General: Skin is warm and dry.     Capillary Refill: Capillary refill takes less than 2 seconds.     Findings: No rash.  Neurological:     Mental Status: She is alert and oriented to person, place, and time.     Cranial Nerves: Cranial nerves are intact.     Gait: Gait is intact.     Deep Tendon Reflexes: Reflexes are normal and symmetric.      Reflex Scores:      Brachioradialis reflexes are 2+ on the right side and 2+ on the left side.      Patellar reflexes are 2+ on the right side and 2+ on the left side. Psychiatric:        Attention and Perception: Attention normal.        Mood and Affect: Mood normal.        Speech: Speech normal.        Behavior: Behavior normal. Behavior is cooperative.        Thought Content: Thought content normal.        Judgment: Judgment normal.     Results for orders placed or performed in visit on 01/30/20  Sed Rate (ESR)  Result Value Ref Range   Sed Rate 32 0 - 40 mm/hr      Assessment & Plan:   Problem List Items Addressed This Visit      Cardiovascular and Mediastinum   Hypertension    Chronic, ongoing.  BP elevated today.  Will increase Losartan to 50 MG daily, script sent, recommend she start taking two of her 25 MG tablets until complete then pick up new script.  Recommend monitor BP a few days a week at home and document.  CMP today.  Return in 4 weeks.      Relevant Medications   losartan (COZAAR) 50 MG tablet   Other Relevant Orders   CBC with Differential/Platelet   Comprehensive metabolic panel   TSH     Digestive   Gastroesophageal reflux disease (Chronic)    Chronic, stable on Omeprazole.  Reports return of symptoms without medication, when has trialed GDR in past.  Continue current dose.  Mag level today.        Relevant Orders   Magnesium     Endocrine   Thyroid goiter    History of reported by patient.  Check TSH today.        Genitourinary   CKD (chronic kidney disease) stage 3, GFR 30-59 ml/min (HCC)    Ongoing, CKD 3.  Continue Losartan for kidney protection.  CMP today.  Refer to nephrology as needed for worsening kidney function.      Relevant Orders   Comprehensive metabolic panel     Other   HX: breast cancer    Continue yearly mammograms, recent in September.      Hyperlipidemia    Chronic, ongoing.  Continue current medication regimen  and adjust as needed.  Lipid panel today.      Relevant Medications   losartan (COZAAR) 50 MG tablet   Other Relevant Orders   Comprehensive metabolic panel   Lipid Panel w/o Chol/HDL Ratio   Vitamin B12 deficiency (dietary) anemia    Chronic, stable.  Continue daily B12 supplement. Check B12 level today and adjust supplement as needed.      Relevant Orders   Vitamin B12   Elevated ferritin    Noted on recent labs,  is taking daily iron with recent level 77.  Recheck ferritin today.      Relevant Orders   Ferritin    Other Visit Diagnoses    Annual physical exam    -  Primary   Annual labs ordered to include CBC, CMP, TSH, lipid.   Vitamin D deficiency       Reports history of low levels, continue supplement and check Vit D today.   Relevant Orders   VITAMIN D 25 Hydroxy (Vit-D Deficiency, Fractures)   Postmenopausal estrogen deficiency       DEXA scan ordered.   Relevant Orders   DG Bone Density       Follow up plan: Return in about 4 weeks (around 03/15/2020) for HTN.   LABORATORY TESTING:  - Pap smear: not applicable  IMMUNIZATIONS:   - Tdap: Tetanus vaccination status reviewed: refused. - Influenza: Refused - Pneumovax: Refused - Prevnar: Refused - HPV: Not applicable - Zostavax vaccine: Refused  SCREENING: -Mammogram: Up to date  - Colonoscopy: Up to date  - Bone Density: Ordered today  -Hearing Test: Not applicable  -Spirometry: Not applicable   PATIENT COUNSELING:   Advised to take 1 mg of folate supplement per day if capable of pregnancy.   Sexuality: Discussed sexually transmitted diseases, partner selection, use of condoms, avoidance of unintended pregnancy  and contraceptive alternatives.   Advised to avoid cigarette smoking.  I discussed with the patient that most people either abstain from alcohol or drink within safe limits (<=14/week and <=4 drinks/occasion for males, <=7/weeks and <= 3 drinks/occasion for females) and that the risk for  alcohol disorders and other health effects rises proportionally with the number of drinks per week and how often a drinker exceeds daily limits.  Discussed cessation/primary prevention of drug use and availability of treatment for abuse.   Diet: Encouraged to adjust caloric intake to maintain  or achieve ideal body weight, to reduce intake of dietary saturated fat and total fat, to limit sodium intake by avoiding high sodium foods and not adding table salt, and to maintain adequate dietary potassium and calcium preferably from fresh fruits, vegetables, and low-fat dairy products.    stressed the importance of regular exercise  Injury prevention: Discussed safety belts, safety helmets, smoke detector, smoking near bedding or upholstery.   Dental health: Discussed importance of regular tooth brushing, flossing, and dental visits.    NEXT PREVENTATIVE PHYSICAL DUE IN 1 YEAR. Return in about 4 weeks (around 03/15/2020) for HTN.

## 2020-02-16 NOTE — Assessment & Plan Note (Signed)
Continue yearly mammograms, recent in September.

## 2020-02-16 NOTE — Assessment & Plan Note (Signed)
Noted on recent labs, is taking daily iron with recent level 77.  Recheck ferritin today.

## 2020-02-17 LAB — CBC WITH DIFFERENTIAL/PLATELET
Basophils Absolute: 0 10*3/uL (ref 0.0–0.2)
Basos: 1 %
EOS (ABSOLUTE): 0.3 10*3/uL (ref 0.0–0.4)
Eos: 4 %
Hematocrit: 31.8 % — ABNORMAL LOW (ref 34.0–46.6)
Hemoglobin: 10.8 g/dL — ABNORMAL LOW (ref 11.1–15.9)
Immature Grans (Abs): 0 10*3/uL (ref 0.0–0.1)
Immature Granulocytes: 0 %
Lymphocytes Absolute: 2 10*3/uL (ref 0.7–3.1)
Lymphs: 26 %
MCH: 31 pg (ref 26.6–33.0)
MCHC: 34 g/dL (ref 31.5–35.7)
MCV: 91 fL (ref 79–97)
Monocytes Absolute: 0.7 10*3/uL (ref 0.1–0.9)
Monocytes: 9 %
Neutrophils Absolute: 4.6 10*3/uL (ref 1.4–7.0)
Neutrophils: 60 %
Platelets: 292 10*3/uL (ref 150–450)
RBC: 3.48 x10E6/uL — ABNORMAL LOW (ref 3.77–5.28)
RDW: 13 % (ref 11.7–15.4)
WBC: 7.6 10*3/uL (ref 3.4–10.8)

## 2020-02-17 LAB — COMPREHENSIVE METABOLIC PANEL
ALT: 32 IU/L (ref 0–32)
AST: 25 IU/L (ref 0–40)
Albumin/Globulin Ratio: 1.6 (ref 1.2–2.2)
Albumin: 4.7 g/dL (ref 3.8–4.8)
Alkaline Phosphatase: 98 IU/L (ref 39–117)
BUN/Creatinine Ratio: 18 (ref 12–28)
BUN: 22 mg/dL (ref 8–27)
Bilirubin Total: <0.2 mg/dL (ref 0.0–1.2)
CO2: 23 mmol/L (ref 20–29)
Calcium: 9.7 mg/dL (ref 8.7–10.3)
Chloride: 102 mmol/L (ref 96–106)
Creatinine, Ser: 1.21 mg/dL — ABNORMAL HIGH (ref 0.57–1.00)
GFR calc Af Amer: 54 mL/min/{1.73_m2} — ABNORMAL LOW (ref >59)
GFR calc non Af Amer: 47 mL/min/{1.73_m2} — ABNORMAL LOW (ref >59)
Globulin, Total: 3 g/dL (ref 1.5–4.5)
Glucose: 90 mg/dL (ref 65–99)
Potassium: 4.3 mmol/L (ref 3.5–5.2)
Sodium: 139 mmol/L (ref 134–144)
Total Protein: 7.7 g/dL (ref 6.0–8.5)

## 2020-02-17 LAB — LIPID PANEL W/O CHOL/HDL RATIO
Cholesterol, Total: 180 mg/dL (ref 100–199)
HDL: 59 mg/dL (ref >39)
LDL Chol Calc (NIH): 108 mg/dL — ABNORMAL HIGH (ref 0–99)
Triglycerides: 72 mg/dL (ref 0–149)
VLDL Cholesterol Cal: 13 mg/dL (ref 5–40)

## 2020-02-17 LAB — MAGNESIUM: Magnesium: 1.8 mg/dL (ref 1.6–2.3)

## 2020-02-17 LAB — VITAMIN B12: Vitamin B-12: 1785 pg/mL — ABNORMAL HIGH (ref 232–1245)

## 2020-02-17 LAB — TSH: TSH: 1.6 u[IU]/mL (ref 0.450–4.500)

## 2020-02-17 LAB — FERRITIN: Ferritin: 330 ng/mL — ABNORMAL HIGH (ref 15–150)

## 2020-02-17 LAB — VITAMIN D 25 HYDROXY (VIT D DEFICIENCY, FRACTURES): Vit D, 25-Hydroxy: 63.3 ng/mL (ref 30.0–100.0)

## 2020-02-19 NOTE — Progress Notes (Signed)
Contacted via MyChart The 10-year ASCVD risk score Kelly Bussing DC Jr., et al., 2013) is: 14.8%   Values used to calculate the score:     Age: 69 years     Sex: Female     Is Non-Hispanic African American: Yes     Diabetic: No     Tobacco smoker: No     Systolic Blood Pressure: [id number] mmHg     Is BP treated: Yes     HDL Cholesterol: 59 mg/dL     Total Cholesterol: 180 mg/dL Hi Ms. Kelly Poole.  Your labs have returned.  Ferritin level is still high without iron on board.  I would like to send you to hematology, the blood doctors, to have this further looked at and add any extra pieces we may need looked at.  Is this okay with you? Continue to hold off on iron.  Your hemoglobin remains low too, which has been baseline for you for some time, so definitely would like this all looked at. - B12 level is good - Vit D level is great - Continue to show some mild kidney disease, but no decline.  Good news - Mag level is normal and thyroid testing normal - LDL on cholesterol is above goal, continue to take your Atorvastatin daily and in future we may consider going up on this dose if elevation above goal on LDL. Let me know if any questions and if you are okay with referral.

## 2020-02-21 ENCOUNTER — Other Ambulatory Visit: Payer: Self-pay | Admitting: Nurse Practitioner

## 2020-02-21 MED ORDER — MUPIROCIN 2 % EX OINT: 1.0000 "application " | TOPICAL_OINTMENT | Freq: Two times a day (BID) | CUTANEOUS | 3 refills | Status: DC

## 2020-02-21 NOTE — Telephone Encounter (Signed)
Requested medication (s) are due for refill today: yes  Requested medication (s) are on the active medication list: yes  Last refill:  01/26/20  Future visit scheduled: yes  Notes to clinic:  no assigned protocol   Requested Prescriptions  Pending Prescriptions Disp Refills   mupirocin ointment (BACTROBAN) 2 % 22 g 0    Sig: Place 1 application into the nose 2 (two) times daily.      Off-Protocol Failed - 02/21/2020 12:34 PM      Failed - Medication not assigned to a protocol, review manually.      Passed - Valid encounter within last 12 months    Recent Outpatient Visits           5 days ago Annual physical exam   [place], Barbaraann Faster, NP   3 weeks ago Blister of scalp   [place], Barbaraann Faster, NP   1 month ago Blister of scalp   [place], Henrine Screws T, NP   2 months ago COVID-19 virus infection   [place], [place]   2 months ago Lab test positive for detection of COVID-19 virus   [place], Barbaraann Faster, NP       Future Appointments             In 3 weeks [place], Barbaraann Faster, NP [facility]   In 7 months  [facility]

## 2020-02-21 NOTE — Telephone Encounter (Signed)
LOV: 02/16/2020, NOV: 03/15/2020 with Marnee Guarneri, NP

## 2020-02-21 NOTE — Telephone Encounter (Signed)
Medication Refill - Medication: mupirocin ointment (BACTROBAN) 2 %   Has the patient contacted their pharmacy? Yes.   (Agent: If no, request that the patient contact the pharmacy for the refill.) (Agent: If yes, when and what did the pharmacy advise?)  Preferred Pharmacy (with phone number or street name):  [facility] [place] Phone:  [phone]  Fax:  [phone]       Agent: Please be advised that RX refills may take up to 3 business days. We ask that you follow-up with your pharmacy.

## 2020-02-27 ENCOUNTER — Other Ambulatory Visit: Payer: Self-pay | Admitting: Nurse Practitioner

## 2020-02-27 ENCOUNTER — Encounter: Payer: Self-pay | Admitting: Nurse Practitioner

## 2020-02-27 DIAGNOSIS — R7989 Other specified abnormal findings of blood chemistry: Secondary | ICD-10-CM

## 2020-03-04 ENCOUNTER — Encounter: Payer: Self-pay | Admitting: Nurse Practitioner

## 2020-03-06 ENCOUNTER — Ambulatory Visit (INDEPENDENT_AMBULATORY_CARE_PROVIDER_SITE_OTHER): Payer: Medicare HMO | Admitting: Nurse Practitioner

## 2020-03-06 ENCOUNTER — Encounter: Payer: Self-pay | Admitting: Nurse Practitioner

## 2020-03-06 DIAGNOSIS — F5101 Primary insomnia: Secondary | ICD-10-CM

## 2020-03-06 MED ORDER — TRAZODONE HCL 50 MG PO TABS: 25.0000 mg | ORAL_TABLET | Freq: Every evening | ORAL | 3 refills | Status: DC | PRN

## 2020-03-06 NOTE — Assessment & Plan Note (Signed)
Chronic, ongoing for years.  Has tried Melatonin and OTC Sleep Aide with no benefit.  Would benefit from sleep study in future, continue to discuss with patient.  At this time will trial Trazodone 25-50 MG as needed at bedtime.  Script sent and educated patient on medication.  Follow-up as scheduled in upcoming weeks.  If no benefit may consider Belsomra.  Educated on sleep hygiene techniques.

## 2020-03-06 NOTE — Progress Notes (Signed)
There were no vitals taken for this visit.   Subjective:    Patient ID: Kelly Poole, female    DOB: Dec 16, 1951, 69 y.o.   MRN: [id number]  HPI: Kelly Poole is a 69 y.o. female  Chief Complaint  Patient presents with  . Insomnia    pt states she wants to discuss taking something to help her sleep    . This visit was completed via telephone due to the restrictions of the COVID-19 pandemic. All issues as above were discussed and addressed but no physical exam was performed. If it was felt that the patient should be evaluated in the office, they were directed there. The patient verbally consented to this visit. Patient was unable to complete an audio/visual visit due to Lack of equipment. Due to the catastrophic nature of the COVID-19 pandemic, this visit was done through audio contact only. . Location of the patient: home . Location of the provider: work . Those involved with this call:  . Provider: Marnee Guarneri, DNP . CMA: Yvonna Alanis, CMA . Front Desk/Registration: Don Perking  . Time spent on call: 15 minutes on the phone discussing health concerns. 10 minutes total spent in review of patient's record and preparation of their chart.   INSOMNIA Has been taking Melatonin for about 8-9 months with no benefit. Also tried Sleep Aide over the counter with no benefit.   Duration: months Satisfied with sleep quality: no Difficulty falling asleep: no Difficulty staying asleep: no Waking a few hours after sleep onset: yes Early morning awakenings: no Daytime hypersomnolence: no Wakes feeling refreshed: yes Good sleep hygiene: yes Apnea: unknown Snoring: unknown -- never had a sleep study Depressed/anxious mood: yes Recent stress: yes Restless legs/nocturnal leg cramps: no Chronic pain/arthritis: yes History of sleep study: no Treatments attempted: melatonin and Sleep Aide  Relevant past medical, surgical, family and social history reviewed and updated as  indicated. Interim medical history since our last visit reviewed. Allergies and medications reviewed and updated.  Review of Systems  Constitutional: Negative for activity change, appetite change, diaphoresis, fatigue and fever.  Respiratory: Negative for cough, chest tightness and shortness of breath.   Cardiovascular: Negative for chest pain, palpitations and leg swelling.  Gastrointestinal: Negative.   Neurological: Negative.   Psychiatric/Behavioral: Positive for sleep disturbance. Negative for decreased concentration, self-injury and suicidal ideas. The patient is not nervous/anxious.     Per HPI unless specifically indicated above     Objective:    There were no vitals taken for this visit.  Wt Readings from Last 3 Encounters:  02/16/20 147 lb 3.2 oz (66.8 kg)  12/08/19 140 lb (63.5 kg)  10/12/19 147 lb 8.3 oz (66.9 kg)    Physical Exam   Unable to perform due to telephone visit only.  Results for orders placed or performed in visit on 02/16/20  Ferritin  Result Value Ref Range   Ferritin 330 (H) 15 - 150 ng/mL  Vitamin B12  Result Value Ref Range   Vitamin B-12 1,785 (H) 232 - 1,245 pg/mL  VITAMIN D 25 Hydroxy (Vit-D Deficiency, Fractures)  Result Value Ref Range   Vit D, 25-Hydroxy 63.3 30.0 - 100.0 ng/mL  CBC with Differential/Platelet  Result Value Ref Range   WBC 7.6 3.4 - 10.8 x10E3/uL   RBC 3.48 (L) 3.77 - 5.28 x10E6/uL   Hemoglobin 10.8 (L) 11.1 - 15.9 g/dL   Hematocrit 31.8 (L) 34.0 - 46.6 %   MCV 91 79 - 97 fL  MCH 31.0 26.6 - 33.0 pg   MCHC 34.0 31.5 - 35.7 g/dL   RDW 13.0 11.7 - 15.4 %   Platelets 292 150 - 450 x10E3/uL   Neutrophils 60 Not Estab. %   Lymphs 26 Not Estab. %   Monocytes 9 Not Estab. %   Eos 4 Not Estab. %   Basos 1 Not Estab. %   Neutrophils Absolute 4.6 1.4 - 7.0 x10E3/uL   Lymphocytes Absolute 2.0 0.7 - 3.1 x10E3/uL   Monocytes Absolute 0.7 0.1 - 0.9 x10E3/uL   EOS (ABSOLUTE) 0.3 0.0 - 0.4 x10E3/uL   Basophils Absolute 0.0  0.0 - 0.2 x10E3/uL   Immature Granulocytes 0 Not Estab. %   Immature Grans (Abs) 0.0 0.0 - 0.1 x10E3/uL  Comprehensive metabolic panel  Result Value Ref Range   Glucose 90 65 - 99 mg/dL   BUN 22 8 - 27 mg/dL   Creatinine, Ser 1.21 (H) 0.57 - 1.00 mg/dL   GFR calc non Af Amer 47 (L) >59 mL/min/1.73   GFR calc Af Amer 54 (L) >59 mL/min/1.73   BUN/Creatinine Ratio 18 12 - 28   Sodium 139 134 - 144 mmol/L   Potassium 4.3 3.5 - 5.2 mmol/L   Chloride 102 96 - 106 mmol/L   CO2 23 20 - 29 mmol/L   Calcium 9.7 8.7 - 10.3 mg/dL   Total Protein 7.7 6.0 - 8.5 g/dL   Albumin 4.7 3.8 - 4.8 g/dL   Globulin, Total 3.0 1.5 - 4.5 g/dL   Albumin/Globulin Ratio 1.6 1.2 - 2.2   Bilirubin Total <0.2 0.0 - 1.2 mg/dL   Alkaline Phosphatase 98 39 - 117 IU/L   AST 25 0 - 40 IU/L   ALT 32 0 - 32 IU/L  Lipid Panel w/o Chol/HDL Ratio  Result Value Ref Range   Cholesterol, Total 180 100 - 199 mg/dL   Triglycerides 72 0 - 149 mg/dL   HDL 59 >39 mg/dL   VLDL Cholesterol Cal 13 5 - 40 mg/dL   LDL Chol Calc (NIH) 108 (H) 0 - 99 mg/dL  TSH  Result Value Ref Range   TSH 1.600 0.450 - 4.500 uIU/mL  Magnesium  Result Value Ref Range   Magnesium 1.8 1.6 - 2.3 mg/dL      Assessment & Plan:   Problem List Items Addressed This Visit      Other   Insomnia    Chronic, ongoing for years.  Has tried Melatonin and OTC Sleep Aide with no benefit.  Would benefit from sleep study in future, continue to discuss with patient.  At this time will trial Trazodone 25-50 MG as needed at bedtime.  Script sent and educated patient on medication.  Follow-up as scheduled in upcoming weeks.  If no benefit may consider Belsomra.  Educated on sleep hygiene techniques.          Follow up plan: Return for as scheduled upcoming.

## 2020-03-06 NOTE — Patient Instructions (Signed)

## 2020-03-08 NOTE — Progress Notes (Deleted)
[place]  [phone]) [phone] [phone]) [phone]  ID: Joslyn Devon OB: [birth year]  MR#: [id number]  [id number]  Patient Care Team: Venita Lick, NP as PCP - General (Nurse Practitioner) Vanita Ingles, RN as Registered Nurse (General Practice)  CHIEF COMPLAINT: Elevated ferritin.  INTERVAL HISTORY: ***  REVIEW OF SYSTEMS:   ROS  As per HPI. Otherwise, a complete review of systems is negative.  PAST MEDICAL HISTORY: Past Medical History:  Diagnosis Date  . Breast cancer ([place]) 1991   left breast mastectomy. No chemo or rad tx  . GERD (gastroesophageal reflux disease)   . Heart murmur   . High cholesterol   . Hypertension     PAST SURGICAL HISTORY: Past Surgical History:  Procedure Laterality Date  . ABDOMINAL HYSTERECTOMY  1978  . MASTECTOMY Left 1991   Due to breast cancer  . TUBAL LIGATION      FAMILY HISTORY: Family History  Problem Relation Age of Onset  . Stroke Mother   . Thyroid disease Mother   . Cancer Father   . Diabetes Maternal Aunt   . Breast cancer Other     ADVANCED DIRECTIVES (Y/N):  N  HEALTH MAINTENANCE: Social History   Tobacco Use  . Smoking status: Never Smoker  . Smokeless tobacco: Never Used  Substance Use Topics  . Alcohol use: No    Alcohol/week: 0.0 standard drinks  . Drug use: No     Colonoscopy:  PAP:  Bone density:  Lipid panel:  Allergies  Allergen Reactions  . Macrobid [Nitrofurantoin Macrocrystal] Other (See Comments)    Sore throat and irritation to throat    Current Outpatient Medications  Medication Sig Dispense Refill  . Ascorbic Acid (VITAMIN C) 1000 MG tablet Take 1,000 mg by mouth daily.    Marland Kitchen aspirin 81 MG tablet Take 81 mg by mouth daily.    Marland Kitchen atorvastatin (LIPITOR) 10 MG tablet Take 1 tablet by mouth once daily 90 tablet 0  . Biotin 5 MG TABS Take 2 tablets by mouth daily.    . Calcium Carb-Cholecalciferol (CALCIUM 600+D3 PO) Take 1 tablet by mouth daily.      . Cetirizine HCl (ALLERGY RELIEF) 10 MG CAPS Take by mouth.    . Garlic 123XX123 MG CAPS Take 1,000 mg by mouth daily.    Marland Kitchen losartan (COZAAR) 50 MG tablet Take 1 tablet (50 mg total) by mouth daily. 90 tablet 3  . meloxicam (MOBIC) 7.5 MG tablet Take 7.5 mg by mouth daily.    . Multiple Vitamin (MULTIVITAMINS PO) Take by mouth daily.    . mupirocin ointment (BACTROBAN) 2 % Place 1 application into the nose 2 (two) times daily. 22 g 3  . omeprazole (PRILOSEC) 20 MG capsule Take 1 capsule by mouth once daily 90 capsule 0  . traZODone (DESYREL) 50 MG tablet Take 0.5-1 tablets (25-50 mg total) by mouth at bedtime as needed for sleep. 30 tablet 3  . vitamin B-12 (CYANOCOBALAMIN) 1000 MCG tablet Take 1,000 mcg by mouth daily.    . vitamin E 100 UNIT capsule Take 100 Units by mouth daily.     No current facility-administered medications for this visit.    OBJECTIVE: There were no vitals filed for this visit.   There is no height or weight on file to calculate BMI.    ECOG FS:{CHL ONC [id number]  General: Well-developed, well-nourished, no acute distress. Eyes: Pink conjunctiva, anicteric sclera. HEENT: Normocephalic, moist mucous membranes. Lungs: No audible wheezing or  coughing. Heart: Regular rate and rhythm. Abdomen: Soft, nontender, no obvious distention. Musculoskeletal: No edema, cyanosis, or clubbing. Neuro: Alert, answering all questions appropriately. Cranial nerves grossly intact. Skin: No rashes or petechiae noted. Psych: Normal affect. Lymphatics: No cervical, calvicular, axillary or inguinal LAD.   LAB RESULTS:  Lab Results  Component Value Date   NA 139 02/16/2020   K 4.3 02/16/2020   CL 102 02/16/2020   CO2 23 02/16/2020   GLUCOSE 90 02/16/2020   BUN 22 02/16/2020   CREATININE 1.21 (H) 02/16/2020   CALCIUM 9.7 02/16/2020   PROT 7.7 02/16/2020   ALBUMIN 4.7 02/16/2020   AST 25 02/16/2020   ALT 32 02/16/2020   ALKPHOS 98 02/16/2020   BILITOT <0.2 02/16/2020    GFRNONAA 47 (L) 02/16/2020   GFRAA 54 (L) 02/16/2020    Lab Results  Component Value Date   WBC 7.6 02/16/2020   NEUTROABS 4.6 02/16/2020   HGB 10.8 (L) 02/16/2020   HCT 31.8 (L) 02/16/2020   MCV 91 02/16/2020   PLT 292 02/16/2020     STUDIES: No results found.  ASSESSMENT: Elevated ferritin.  PLAN:    1. Elevated ferritin:  Patient expressed understanding and was in agreement with this plan. She also understands that She can call clinic at any time with any questions, concerns, or complaints.   Cancer Staging No matching staging information was found for the patient.  Lloyd Huger, MD   03/08/2020 7:11 AM

## 2020-03-11 ENCOUNTER — Inpatient Hospital Stay: Payer: Medicare HMO | Admitting: Oncology

## 2020-03-11 ENCOUNTER — Encounter: Payer: Self-pay | Admitting: Nurse Practitioner

## 2020-03-11 ENCOUNTER — Other Ambulatory Visit: Payer: Self-pay | Admitting: Nurse Practitioner

## 2020-03-11 ENCOUNTER — Telehealth: Payer: Self-pay | Admitting: Nurse Practitioner

## 2020-03-11 DIAGNOSIS — M25561 Pain in right knee: Secondary | ICD-10-CM

## 2020-03-11 NOTE — Progress Notes (Signed)
Needs referral back to ortho

## 2020-03-11 NOTE — Telephone Encounter (Signed)
Copied from [place] [id number]. Topic: Referral - Request for Referral >> Mar 11, 2020 12:43 PM Erick Blinks wrote: Reason for CRM: Pt called reporting that she is still in pain and needs to go back to the orthopedic doctor. Soreness in the middle of her right knee, towards the left side. It is sore and painful.  [phone]

## 2020-03-11 NOTE — Telephone Encounter (Signed)
Called and spoke to patient. She states that [facility] told her that she would need a new referral. Explained to patient that if she gets worse in the meantime, then she can go their walk in clinic. Patient verbalized understanding.

## 2020-03-11 NOTE — Telephone Encounter (Signed)
New referral in for her.

## 2020-03-12 ENCOUNTER — Ambulatory Visit: Payer: Medicare HMO | Attending: Internal Medicine

## 2020-03-12 DIAGNOSIS — Z23 Encounter for immunization: Secondary | ICD-10-CM

## 2020-03-12 NOTE — Progress Notes (Signed)
   Covid-19 Vaccination Clinic  Name:  Kelly Poole    MRN: [id number] DOB: 12/16/1951  03/12/2020  [name] was observed post Covid-19 immunization for 15 minutes without incident. She was provided with Vaccine Information Sheet and instruction to access the V-Safe system.   [name] was instructed to call 911 with any severe reactions post vaccine: Marland Kitchen Difficulty breathing  . Swelling of face and throat  . A fast heartbeat  . A bad rash all over body  . Dizziness and weakness   Immunizations Administered    Name Date Dose VIS Date Route   Moderna COVID-19 Vaccine 03/12/2020  4:10 PM 0.5 mL 11/21/2019 Intramuscular   Manufacturer: Moderna   Lot: [id number]   [id number]

## 2020-03-13 ENCOUNTER — Ambulatory Visit: Payer: Self-pay | Admitting: General Practice

## 2020-03-13 ENCOUNTER — Telehealth: Payer: Medicare HMO

## 2020-03-13 NOTE — Chronic Care Management (AMB) (Signed)
°  Chronic Care Management   Outreach Note  03/13/2020 Name: [name] MRN: [id number] DOB: [birth year]  Referred by: Venita Lick, NP Reason for referral : Chronic Care Management (Initial call for [facility] Chronic Disease Management and Care Coordination needs)   An unsuccessful telephone outreach was attempted today. The patient was referred to the case management team for assistance with care management and care coordination.   Follow Up Plan: A HIPPA compliant phone message was left for the patient providing contact information and requesting a return call.   Noreene Larsson RN, MSN, [facility] Mobile: [phone]

## 2020-03-14 ENCOUNTER — Encounter: Payer: Self-pay | Admitting: Oncology

## 2020-03-14 ENCOUNTER — Inpatient Hospital Stay: Payer: Medicare HMO | Attending: Oncology | Admitting: Oncology

## 2020-03-14 ENCOUNTER — Inpatient Hospital Stay: Payer: Medicare HMO

## 2020-03-14 ENCOUNTER — Other Ambulatory Visit: Payer: Self-pay

## 2020-03-14 VITALS — BP 185/96 | HR 85 | Temp 98.0°F | Wt 149.0 lb

## 2020-03-14 DIAGNOSIS — R7989 Other specified abnormal findings of blood chemistry: Secondary | ICD-10-CM

## 2020-03-14 DIAGNOSIS — E7800 Pure hypercholesterolemia, unspecified: Secondary | ICD-10-CM | POA: Diagnosis not present

## 2020-03-14 DIAGNOSIS — Z803 Family history of malignant neoplasm of breast: Secondary | ICD-10-CM | POA: Diagnosis not present

## 2020-03-14 DIAGNOSIS — Z8349 Family history of other endocrine, nutritional and metabolic diseases: Secondary | ICD-10-CM | POA: Diagnosis not present

## 2020-03-14 DIAGNOSIS — D649 Anemia, unspecified: Secondary | ICD-10-CM

## 2020-03-14 DIAGNOSIS — I10 Essential (primary) hypertension: Secondary | ICD-10-CM | POA: Diagnosis not present

## 2020-03-14 DIAGNOSIS — Z90710 Acquired absence of both cervix and uterus: Secondary | ICD-10-CM | POA: Diagnosis not present

## 2020-03-14 DIAGNOSIS — Z853 Personal history of malignant neoplasm of breast: Secondary | ICD-10-CM | POA: Insufficient documentation

## 2020-03-14 DIAGNOSIS — Z7982 Long term (current) use of aspirin: Secondary | ICD-10-CM | POA: Diagnosis not present

## 2020-03-14 DIAGNOSIS — Z9012 Acquired absence of left breast and nipple: Secondary | ICD-10-CM | POA: Diagnosis not present

## 2020-03-14 DIAGNOSIS — Z833 Family history of diabetes mellitus: Secondary | ICD-10-CM

## 2020-03-14 DIAGNOSIS — Z791 Long term (current) use of non-steroidal anti-inflammatories (NSAID): Secondary | ICD-10-CM | POA: Diagnosis not present

## 2020-03-14 DIAGNOSIS — Z79899 Other long term (current) drug therapy: Secondary | ICD-10-CM | POA: Diagnosis not present

## 2020-03-14 LAB — IRON AND TIBC
Iron: 25 ug/dL — ABNORMAL LOW (ref 28–170)
Saturation Ratios: 12 % (ref 10.4–31.8)
TIBC: 214 ug/dL — ABNORMAL LOW (ref 250–450)
UIBC: 189 ug/dL

## 2020-03-14 LAB — CBC WITH DIFFERENTIAL/PLATELET
Abs Immature Granulocytes: 0.01 10*3/uL (ref 0.00–0.07)
Basophils Absolute: 0 10*3/uL (ref 0.0–0.1)
Basophils Relative: 0 %
Eosinophils Absolute: 0.3 10*3/uL (ref 0.0–0.5)
Eosinophils Relative: 5 %
HCT: 31.2 % — ABNORMAL LOW (ref 36.0–46.0)
Hemoglobin: 10.3 g/dL — ABNORMAL LOW (ref 12.0–15.0)
Immature Granulocytes: 0 %
Lymphocytes Relative: 32 %
Lymphs Abs: 2.1 10*3/uL (ref 0.7–4.0)
MCH: 31 pg (ref 26.0–34.0)
MCHC: 33 g/dL (ref 30.0–36.0)
MCV: 94 fL (ref 80.0–100.0)
Monocytes Absolute: 0.9 10*3/uL (ref 0.1–1.0)
Monocytes Relative: 14 %
Neutro Abs: 3.3 10*3/uL (ref 1.7–7.7)
Neutrophils Relative %: 49 %
Platelets: 236 10*3/uL (ref 150–400)
RBC: 3.32 MIL/uL — ABNORMAL LOW (ref 3.87–5.11)
RDW: 13 % (ref 11.5–15.5)
WBC: 6.7 10*3/uL (ref 4.0–10.5)
nRBC: 0 % (ref 0.0–0.2)

## 2020-03-14 LAB — FOLATE: Folate: 52.2 ng/mL (ref >5.9)

## 2020-03-14 LAB — FERRITIN: Ferritin: 170 ng/mL (ref 11–307)

## 2020-03-14 NOTE — Progress Notes (Signed)
[place]  Telephone:(336) [phone] Fax:(336) [phone]  ID: Joslyn Devon OB: [birth year]  MR#: [id number]  [id number]  Patient Care Team: Venita Lick, NP as PCP - General (Nurse Practitioner) Vanita Ingles, RN as Registered Nurse (General Practice)  CHIEF COMPLAINT: Elevated ferritin  INTERVAL HISTORY: Patient is a 69 year old female who was noted to have an elevated ferritin on routine blood work.  Repeat laboratory work confirmed the results.  She currently feels well and is asymptomatic.  She has no neurologic complaints.  She denies any recent fevers or illnesses.  She has a good appetite and denies weight loss.  She has no chest pain, shortness of breath, cough, or hemoptysis.  She denies any nausea, vomiting, constipation, or diarrhea.  She has no urinary complaints.  Patient feels at her baseline offers no specific complaints today.  REVIEW OF SYSTEMS:   Review of Systems  Constitutional: Negative.  Negative for fever, malaise/fatigue and weight loss.  Respiratory: Negative.  Negative for cough.   Cardiovascular: Negative.  Negative for chest pain and leg swelling.  Gastrointestinal: Negative.  Negative for abdominal pain, blood in stool and melena.  Genitourinary: Negative.  Negative for dysuria.  Musculoskeletal: Negative.  Negative for back pain.  Skin: Negative.  Negative for rash.  Neurological: Negative.  Negative for dizziness, focal weakness, weakness and headaches.  Psychiatric/Behavioral: Negative.  The patient is not nervous/anxious.     As per HPI. Otherwise, a complete review of systems is negative.  PAST MEDICAL HISTORY: Past Medical History:  Diagnosis Date  . Breast cancer ([place]) 1991   left breast mastectomy. No chemo or rad tx  . GERD (gastroesophageal reflux disease)   . Heart murmur   . High cholesterol   . Hypertension     PAST SURGICAL HISTORY: Past Surgical History:  Procedure Laterality Date  . ABDOMINAL  HYSTERECTOMY  1978  . MASTECTOMY Left 1991   Due to breast cancer  . TUBAL LIGATION      FAMILY HISTORY: Family History  Problem Relation Age of Onset  . Stroke Mother   . Thyroid disease Mother   . Cancer Father   . Diabetes Maternal Aunt   . Breast cancer Other     ADVANCED DIRECTIVES (Y/N):  N  HEALTH MAINTENANCE: Social History   Tobacco Use  . Smoking status: Never Smoker  . Smokeless tobacco: Never Used  Substance Use Topics  . Alcohol use: No    Alcohol/week: 0.0 standard drinks  . Drug use: No     Colonoscopy:  PAP:  Bone density:  Lipid panel:  Allergies  Allergen Reactions  . Macrobid [Nitrofurantoin Macrocrystal] Other (See Comments)    Sore throat and irritation to throat    Current Outpatient Medications  Medication Sig Dispense Refill  . Ascorbic Acid (VITAMIN C) 1000 MG tablet Take 1,000 mg by mouth daily.    Marland Kitchen aspirin 81 MG tablet Take 81 mg by mouth daily.    Marland Kitchen atorvastatin (LIPITOR) 10 MG tablet Take 1 tablet by mouth once daily 90 tablet 0  . Biotin 5 MG TABS Take 2 tablets by mouth daily.    . Calcium Carb-Cholecalciferol (CALCIUM 600+D3 PO) Take 1 tablet by mouth daily.    . Cetirizine HCl (ALLERGY RELIEF) 10 MG CAPS Take by mouth.    . Garlic 123XX123 MG CAPS Take 1,000 mg by mouth daily.    Marland Kitchen losartan (COZAAR) 50 MG tablet Take 1 tablet (50 mg total) by mouth daily. 90 tablet  3  . meloxicam (MOBIC) 7.5 MG tablet Take 7.5 mg by mouth daily.    . Multiple Vitamin (MULTIVITAMINS PO) Take by mouth daily.    . mupirocin ointment (BACTROBAN) 2 % Place 1 application into the nose 2 (two) times daily. 22 g 3  . omeprazole (PRILOSEC) 20 MG capsule Take 1 capsule by mouth once daily 90 capsule 0  . traZODone (DESYREL) 50 MG tablet Take 0.5-1 tablets (25-50 mg total) by mouth at bedtime as needed for sleep. 30 tablet 3  . vitamin B-12 (CYANOCOBALAMIN) 1000 MCG tablet Take 1,000 mcg by mouth daily.    . vitamin E 100 UNIT capsule Take 100 Units by mouth  daily.     No current facility-administered medications for this visit.    OBJECTIVE: Vitals:   03/14/20 1521  BP: (!) 185/96  Pulse: 85  Temp: 98 F (36.7 C)  SpO2: 100%     Body mass index is 32.24 kg/m.    ECOG FS:0 - Asymptomatic  General: Well-developed, well-nourished, no acute distress. Eyes: Pink conjunctiva, anicteric sclera. HEENT: Normocephalic, moist mucous membranes. Lungs: No audible wheezing or coughing. Heart: Regular rate and rhythm. Abdomen: Soft, nontender, no obvious distention. Musculoskeletal: No edema, cyanosis, or clubbing. Neuro: Alert, answering all questions appropriately. Cranial nerves grossly intact. Skin: No rashes or petechiae noted. Psych: Normal affect. Lymphatics: No cervical, calvicular, axillary or inguinal LAD.   LAB RESULTS:  Lab Results  Component Value Date   NA 139 02/16/2020   K 4.3 02/16/2020   CL 102 02/16/2020   CO2 23 02/16/2020   GLUCOSE 90 02/16/2020   BUN 22 02/16/2020   CREATININE 1.21 (H) 02/16/2020   CALCIUM 9.7 02/16/2020   PROT 7.7 02/16/2020   ALBUMIN 4.7 02/16/2020   AST 25 02/16/2020   ALT 32 02/16/2020   ALKPHOS 98 02/16/2020   BILITOT <0.2 02/16/2020   GFRNONAA 47 (L) 02/16/2020   GFRAA 54 (L) 02/16/2020    Lab Results  Component Value Date   WBC 6.7 03/14/2020   NEUTROABS 3.3 03/14/2020   HGB 10.3 (L) 03/14/2020   HCT 31.2 (L) 03/14/2020   MCV 94.0 03/14/2020   PLT 236 03/14/2020     STUDIES: No results found.  ASSESSMENT: Elevated ferritin.  PLAN:    1. Elevated ferritin: Patient's most recent ferritin levels were mildly elevated at 330.  Today's results are pending at time of dictation.  Have also ordered complete iron panel and hemochromatosis mutation for completeness.  No intervention is needed at this time.  Patient does not require phlebotomy or treatment.  She will have a video assisted telemedicine visit in 3 weeks to discuss the results. 2.  Anemia: Patient's hemoglobin is  mildly decreased at 10.3, but stable.  Iron stores and folate are pending at time of dictation.  Previously, B12 levels were reported as normal.  Have ordered hemoglobinopathy profile for completeness.  Return to clinic as above.  I spent a total of 45 minutes reviewing chart data, face-to-face evaluation with the patient, counseling and coordination of care as detailed above.   Patient expressed understanding and was in agreement with this plan. She also understands that She can call clinic at any time with any questions, concerns, or complaints.    Lloyd Huger, MD   03/14/2020 4:27 PM

## 2020-03-14 NOTE — Progress Notes (Signed)
Pt called to go over information related [facility]. Questions and concerns gone over. Pt has no specific questions or concerns at this time.  

## 2020-03-15 ENCOUNTER — Ambulatory Visit: Payer: Medicare HMO | Admitting: Nurse Practitioner

## 2020-03-15 ENCOUNTER — Ambulatory Visit (INDEPENDENT_AMBULATORY_CARE_PROVIDER_SITE_OTHER): Payer: Medicare HMO | Admitting: Nurse Practitioner

## 2020-03-15 ENCOUNTER — Encounter: Payer: Self-pay | Admitting: Nurse Practitioner

## 2020-03-15 ENCOUNTER — Other Ambulatory Visit: Payer: Self-pay

## 2020-03-15 DIAGNOSIS — I10 Essential (primary) hypertension: Secondary | ICD-10-CM | POA: Diagnosis not present

## 2020-03-15 MED ORDER — HYDROCHLOROTHIAZIDE 25 MG PO TABS: 25.0000 mg | ORAL_TABLET | Freq: Every day | ORAL | 3 refills | Status: DC

## 2020-03-15 MED ORDER — LOSARTAN POTASSIUM 100 MG PO TABS: 100.0000 mg | ORAL_TABLET | Freq: Every day | ORAL | 3 refills | Status: DC

## 2020-03-15 MED ORDER — TRAZODONE HCL 50 MG PO TABS: 25.0000 mg | ORAL_TABLET | Freq: Every evening | ORAL | 3 refills | Status: DC | PRN

## 2020-03-15 NOTE — Progress Notes (Signed)
BP 140/88 (BP Location: Left Arm, Patient Position: Sitting)   Pulse 83   Temp 98.8 F (37.1 C) (Oral)   Ht 4' 9.09" (1.45 m)   Wt 148 lb 12.8 oz (67.5 kg)   SpO2 100%   BMI 32.10 kg/m    Subjective:    Patient ID: Kelly Poole, female    DOB: 1951/02/15, 69 y.o.   MRN: [id number]  HPI: Kelly Poole is a 69 y.o. female  Chief Complaint  Patient presents with  . Hypertension   HYPERTENSION Continues on Losartan 100 MG and ASA.  She is having right knee pain and sees ortho on Wednesday.   Hypertension status: uncontrolled  Satisfied with current treatment? yes Duration of hypertension: chronic BP monitoring frequency:  not checking BP range:  BP medication side effects:  no Medication compliance: good compliance Aspirin: yes Recurrent headaches: no Visual changes: no Palpitations: no Dyspnea: no Chest pain: no Lower extremity edema: no Dizzy/lightheaded: no  Relevant past medical, surgical, family and social history reviewed and updated as indicated. Interim medical history since our last visit reviewed. Allergies and medications reviewed and updated.  Review of Systems  Constitutional: Negative for activity change, appetite change, diaphoresis, fatigue and fever.  Respiratory: Negative for cough, chest tightness and shortness of breath.   Cardiovascular: Negative for chest pain, palpitations and leg swelling.  Gastrointestinal: Negative.   Neurological: Negative.   Psychiatric/Behavioral: Negative.     Per HPI unless specifically indicated above     Objective:    BP 140/88 (BP Location: Left Arm, Patient Position: Sitting)   Pulse 83   Temp 98.8 F (37.1 C) (Oral)   Ht 4' 9.09" (1.45 m)   Wt 148 lb 12.8 oz (67.5 kg)   SpO2 100%   BMI 32.10 kg/m   Wt Readings from Last 3 Encounters:  03/15/20 148 lb 12.8 oz (67.5 kg)  03/14/20 149 lb (67.6 kg)  02/16/20 147 lb 3.2 oz (66.8 kg)    Physical Exam Vitals and nursing note reviewed.    Constitutional:      General: She is awake. She is not in acute distress.    Appearance: She is well-developed, well-groomed and overweight. She is not ill-appearing.  HENT:     Head: Normocephalic.     Right Ear: Hearing normal.     Left Ear: Hearing normal.     Nose: Nose normal.  Eyes:     General: Lids are normal.        Right eye: No discharge.        Left eye: No discharge.     Conjunctiva/sclera: Conjunctivae normal.     Pupils: Pupils are equal, round, and reactive to light.  Neck:     Thyroid: No thyromegaly.     Vascular: No carotid bruit.  Cardiovascular:     Rate and Rhythm: Normal rate and regular rhythm.     Heart sounds: Normal heart sounds. No murmur. No gallop.   Pulmonary:     Effort: Pulmonary effort is normal. No accessory muscle usage or respiratory distress.     Breath sounds: Normal breath sounds.  Abdominal:     General: Bowel sounds are normal.     Palpations: Abdomen is soft.  Musculoskeletal:     Cervical back: Normal range of motion and neck supple.     Right lower leg: No edema.     Left lower leg: No edema.  Skin:    General: Skin is warm and dry.  Neurological:     Mental Status: She is alert and oriented to person, place, and time.  Psychiatric:        Attention and Perception: Attention normal.        Mood and Affect: Mood normal.        Speech: Speech normal.        Behavior: Behavior normal. Behavior is cooperative.        Thought Content: Thought content normal.     Results for orders placed or performed in visit on 03/14/20  Folate  Result Value Ref Range   Folate 52.2 >5.9 ng/mL  Iron and TIBC  Result Value Ref Range   Iron 25 (L) 28 - 170 ug/dL   TIBC 214 (L) 250 - 450 ug/dL   Saturation Ratios 12 10.4 - 31.8 %   UIBC 189 ug/dL  Ferritin  Result Value Ref Range   Ferritin 170 11 - 307 ng/mL  CBC with Differential/Platelet  Result Value Ref Range   WBC 6.7 4.0 - 10.5 K/uL   RBC 3.32 (L) 3.87 - 5.11 MIL/uL   Hemoglobin  10.3 (L) 12.0 - 15.0 g/dL   HCT 31.2 (L) 36.0 - 46.0 %   MCV 94.0 80.0 - 100.0 fL   MCH 31.0 26.0 - 34.0 pg   MCHC 33.0 30.0 - 36.0 g/dL   RDW 13.0 11.5 - 15.5 %   Platelets 236 150 - 400 K/uL   nRBC 0.0 0.0 - 0.2 %   Neutrophils Relative % 49 %   Neutro Abs 3.3 1.7 - 7.7 K/uL   Lymphocytes Relative 32 %   Lymphs Abs 2.1 0.7 - 4.0 K/uL   Monocytes Relative 14 %   Monocytes Absolute 0.9 0.1 - 1.0 K/uL   Eosinophils Relative 5 %   Eosinophils Absolute 0.3 0.0 - 0.5 K/uL   Basophils Relative 0 %   Basophils Absolute 0.0 0.0 - 0.1 K/uL   Immature Granulocytes 0 %   Abs Immature Granulocytes 0.01 0.00 - 0.07 K/uL      Assessment & Plan:   Problem List Items Addressed This Visit      Cardiovascular and Mediastinum   Hypertension    Chronic, ongoing.  Initial BP elevated, repeat improved but continues to be elevated above goal.  Will continue Losartan 100 MG and start HCTZ 25 MG, has taken this in past and reports good tolerance.  Recommend she monitor BP at home at least as few mornings a week and document.  Return in 4 weeks for follow-up and labs.        Relevant Medications   losartan (COZAAR) 100 MG tablet   hydrochlorothiazide (HYDRODIURIL) 25 MG tablet       Follow up plan: Return in about 4 weeks (around 04/12/2020) for HTN.

## 2020-03-15 NOTE — Patient Instructions (Signed)
DASH Eating Plan DASH stands for "Dietary Approaches to Stop Hypertension." The DASH eating plan is a healthy eating plan that has been shown to reduce high blood pressure (hypertension). It may also reduce your risk for type 2 diabetes, heart disease, and stroke. The DASH eating plan may also help with weight loss. What are tips for following this plan?  General guidelines  Avoid eating more than 2,300 mg (milligrams) of salt (sodium) a day. If you have hypertension, you may need to reduce your sodium intake to 1,500 mg a day.  Limit alcohol intake to no more than 1 drink a day for nonpregnant women and 2 drinks a day for men. One drink equals 12 oz of beer, 5 oz of wine, or 1 oz of hard liquor.  Work with your health care provider to maintain a healthy body weight or to lose weight. Ask what an ideal weight is for you.  Get at least 30 minutes of exercise that causes your heart to beat faster (aerobic exercise) most days of the week. Activities may include walking, swimming, or biking.  Work with your health care provider or diet and nutrition specialist (dietitian) to adjust your eating plan to your individual calorie needs. Reading food labels   Check food labels for the amount of sodium per serving. Choose foods with less than 5 percent of the Daily Value of sodium. Generally, foods with less than 300 mg of sodium per serving fit into this eating plan.  To find whole grains, look for the word "whole" as the first word in the ingredient list. Shopping  Buy products labeled as "low-sodium" or "no salt added."  Buy fresh foods. Avoid canned foods and premade or frozen meals. Cooking  Avoid adding salt when cooking. Use salt-free seasonings or herbs instead of table salt or sea salt. Check with your health care provider or pharmacist before using salt substitutes.  Do not fry foods. Cook foods using healthy methods such as baking, boiling, grilling, and broiling instead.  Cook with  heart-healthy oils, such as olive, canola, soybean, or sunflower oil. Meal planning  Eat a balanced diet that includes: ? 5 or more servings of fruits and vegetables each day. At each meal, try to fill half of your plate with fruits and vegetables. ? Up to 6-8 servings of whole grains each day. ? Less than 6 oz of lean meat, poultry, or fish each day. A 3-oz serving of meat is about the same size as a deck of cards. One egg equals 1 oz. ? 2 servings of low-fat dairy each day. ? A serving of nuts, seeds, or beans 5 times each week. ? Heart-healthy fats. Healthy fats called Omega-3 fatty acids are found in foods such as flaxseeds and coldwater fish, like sardines, salmon, and mackerel.  Limit how much you eat of the following: ? Canned or prepackaged foods. ? Food that is high in trans fat, such as fried foods. ? Food that is high in saturated fat, such as fatty meat. ? Sweets, desserts, sugary drinks, and other foods with added sugar. ? Full-fat dairy products.  Do not salt foods before eating.  Try to eat at least 2 vegetarian meals each week.  Eat more home-cooked food and less restaurant, buffet, and fast food.  When eating at a restaurant, ask that your food be prepared with less salt or no salt, if possible. What foods are recommended? The items listed may not be a complete list. Talk with your dietitian about   what dietary choices are best for you. Grains Whole-grain or whole-wheat bread. Whole-grain or whole-wheat pasta. Brown rice. Oatmeal. Quinoa. Bulgur. Whole-grain and low-sodium cereals. Pita bread. Low-fat, low-sodium crackers. Whole-wheat flour tortillas. Vegetables Fresh or frozen vegetables (raw, steamed, roasted, or grilled). Low-sodium or reduced-sodium tomato and vegetable juice. Low-sodium or reduced-sodium tomato sauce and tomato paste. Low-sodium or reduced-sodium canned vegetables. Fruits All fresh, dried, or frozen fruit. Canned fruit in natural juice (without  added sugar). Meat and other protein foods Skinless chicken or turkey. Ground chicken or turkey. Pork with fat trimmed off. Fish and seafood. Egg whites. Dried beans, peas, or lentils. Unsalted nuts, nut butters, and seeds. Unsalted canned beans. Lean cuts of beef with fat trimmed off. Low-sodium, lean deli meat. Dairy Low-fat (1%) or fat-free (skim) milk. Fat-free, low-fat, or reduced-fat cheeses. Nonfat, low-sodium ricotta or cottage cheese. Low-fat or nonfat yogurt. Low-fat, low-sodium cheese. Fats and oils Soft margarine without trans fats. Vegetable oil. Low-fat, reduced-fat, or light mayonnaise and salad dressings (reduced-sodium). Canola, safflower, olive, soybean, and sunflower oils. Avocado. Seasoning and other foods Herbs. Spices. Seasoning mixes without salt. Unsalted popcorn and pretzels. Fat-free sweets. What foods are not recommended? The items listed may not be a complete list. Talk with your dietitian about what dietary choices are best for you. Grains Baked goods made with fat, such as croissants, muffins, or some breads. Dry pasta or rice meal packs. Vegetables Creamed or fried vegetables. Vegetables in a cheese sauce. Regular canned vegetables (not low-sodium or reduced-sodium). Regular canned tomato sauce and paste (not low-sodium or reduced-sodium). Regular tomato and vegetable juice (not low-sodium or reduced-sodium). Pickles. Olives. Fruits Canned fruit in a light or heavy syrup. Fried fruit. Fruit in cream or butter sauce. Meat and other protein foods Fatty cuts of meat. Ribs. Fried meat. Bacon. Sausage. Bologna and other processed lunch meats. Salami. Fatback. Hotdogs. Bratwurst. Salted nuts and seeds. Canned beans with added salt. Canned or smoked fish. Whole eggs or egg yolks. Chicken or turkey with skin. Dairy Whole or 2% milk, cream, and half-and-half. Whole or full-fat cream cheese. Whole-fat or sweetened yogurt. Full-fat cheese. Nondairy creamers. Whipped toppings.  Processed cheese and cheese spreads. Fats and oils Butter. Stick margarine. Lard. Shortening. Ghee. Bacon fat. Tropical oils, such as coconut, palm kernel, or palm oil. Seasoning and other foods Salted popcorn and pretzels. Onion salt, garlic salt, seasoned salt, table salt, and sea salt. Worcestershire sauce. Tartar sauce. Barbecue sauce. Teriyaki sauce. Soy sauce, including reduced-sodium. Steak sauce. Canned and packaged gravies. Fish sauce. Oyster sauce. Cocktail sauce. Horseradish that you find on the shelf. Ketchup. Mustard. Meat flavorings and tenderizers. Bouillon cubes. Hot sauce and Tabasco sauce. Premade or packaged marinades. Premade or packaged taco seasonings. Relishes. Regular salad dressings. Where to find more information:  National Heart, Lung, and Blood Institute: www.nhlbi.nih.gov  American Heart Association: www.heart.org Summary  The DASH eating plan is a healthy eating plan that has been shown to reduce high blood pressure (hypertension). It may also reduce your risk for type 2 diabetes, heart disease, and stroke.  With the DASH eating plan, you should limit salt (sodium) intake to 2,300 mg a day. If you have hypertension, you may need to reduce your sodium intake to 1,500 mg a day.  When on the DASH eating plan, aim to eat more fresh fruits and vegetables, whole grains, lean proteins, low-fat dairy, and heart-healthy fats.  Work with your health care provider or diet and nutrition specialist (dietitian) to adjust your eating plan to your   individual calorie needs. This information is not intended to replace advice given to you by your health care provider. Make sure you discuss any questions you have with your health care provider. Document Revised: 11/19/2017 Document Reviewed: 11/30/2016 Elsevier Patient Education  2020 Elsevier Inc.  

## 2020-03-15 NOTE — Assessment & Plan Note (Signed)
Chronic, ongoing.  Initial BP elevated, repeat improved but continues to be elevated above goal.  Will continue Losartan 100 MG and start HCTZ 25 MG, has taken this in past and reports good tolerance.  Recommend she monitor BP at home at least as few mornings a week and document.  Return in 4 weeks for follow-up and labs.

## 2020-03-20 DIAGNOSIS — M1711 Unilateral primary osteoarthritis, right knee: Secondary | ICD-10-CM | POA: Diagnosis not present

## 2020-03-20 LAB — HEMOCHROMATOSIS DNA-PCR(C282Y,H63D)

## 2020-03-29 NOTE — Progress Notes (Signed)
[facility]  [phone] [phone]  ID: Kelly Poole OB: 31-May-1951  MR#: [id number]  [id number]  Patient Care Team: Venita Lick, NP as PCP - General (Nurse Practitioner) Vanita Ingles, RN as Registered Nurse (General Practice)  I connected with Kelly Poole on 04/04/20 at  3:30 PM EDT by video enabled telemedicine visit and verified that I am speaking with the correct person using two identifiers.   I discussed the limitations, risks, security and privacy concerns of performing an evaluation and management service by telemedicine and the availability of in-person appointments. I also discussed with the patient that there may be a patient responsible charge related to this service. The patient expressed understanding and agreed to proceed.   Other persons participating in the visit and their role in the encounter: Patient, MD.  Patient's location: Home. Provider's location: Clinic.  CHIEF COMPLAINT: Elevated ferritin  INTERVAL HISTORY: Patient agreed to video assisted telemedicine visit for further evaluation and discussion of her laboratory results.  She continues to feel well and remains asymptomatic.  She has no neurologic complaints.  She denies any recent fevers or illnesses.  She has a good appetite and denies weight loss.  She has no chest pain, shortness of breath, cough, or hemoptysis.  She denies any nausea, vomiting, constipation, or diarrhea.  She has no urinary complaints.  Patient offers no specific complaints today.  REVIEW OF SYSTEMS:   Review of Systems  Constitutional: Negative.  Negative for fever, malaise/fatigue and weight loss.  Respiratory: Negative.  Negative for cough.   Cardiovascular: Negative.  Negative for chest pain and leg swelling.  Gastrointestinal: Negative.  Negative for abdominal pain, blood in stool and melena.  Genitourinary: Negative.  Negative for dysuria.  Musculoskeletal: Negative.   Negative for back pain.  Skin: Negative.  Negative for rash.  Neurological: Negative.  Negative for dizziness, focal weakness, weakness and headaches.  Psychiatric/Behavioral: Negative.  The patient is not nervous/anxious.     As per HPI. Otherwise, a complete review of systems is negative.  PAST MEDICAL HISTORY: Past Medical History:  Diagnosis Date  . Breast cancer ([place]) 1991   left breast mastectomy. No chemo or rad tx  . GERD (gastroesophageal reflux disease)   . Heart murmur   . High cholesterol   . Hypertension     PAST SURGICAL HISTORY: Past Surgical History:  Procedure Laterality Date  . ABDOMINAL HYSTERECTOMY  1978  . MASTECTOMY Left 1991   Due to breast cancer  . TUBAL LIGATION      FAMILY HISTORY: Family History  Problem Relation Age of Onset  . Stroke Mother   . Thyroid disease Mother   . Cancer Father   . Diabetes Maternal Aunt   . Breast cancer Other     ADVANCED DIRECTIVES (Y/N):  N  HEALTH MAINTENANCE: Social History   Tobacco Use  . Smoking status: Never Smoker  . Smokeless tobacco: Never Used  Substance Use Topics  . Alcohol use: No    Alcohol/week: 0.0 standard drinks  . Drug use: No     Colonoscopy:  PAP:  Bone density:  Lipid panel:  Allergies  Allergen Reactions  . Macrobid [Nitrofurantoin Macrocrystal] Other (See Comments)    Sore throat and irritation to throat    Current Outpatient Medications  Medication Sig Dispense Refill  . Ascorbic Acid (VITAMIN C) 1000 MG tablet Take 1,000 mg by mouth daily.    Marland Kitchen aspirin 81 MG tablet Take 81 mg by  mouth daily.    Marland Kitchen atorvastatin (LIPITOR) 10 MG tablet Take 1 tablet by mouth once daily 90 tablet 0  . Biotin 5 MG TABS Take 2 tablets by mouth daily.    . Calcium Carb-Cholecalciferol (CALCIUM 600+D3 PO) Take 1 tablet by mouth daily.    . Cetirizine HCl (ALLERGY RELIEF) 10 MG CAPS Take by mouth.    . Garlic 123XX123 MG CAPS Take 1,000 mg by mouth daily.    . hydrochlorothiazide (HYDRODIURIL)  25 MG tablet Take 1 tablet (25 mg total) by mouth daily. 90 tablet 3  . losartan (COZAAR) 100 MG tablet Take 1 tablet (100 mg total) by mouth daily. 90 tablet 3  . meloxicam (MOBIC) 7.5 MG tablet Take 7.5 mg by mouth daily.    . Multiple Vitamin (MULTIVITAMINS PO) Take by mouth daily.    . mupirocin ointment (BACTROBAN) 2 % Place 1 application into the nose 2 (two) times daily. 22 g 3  . omeprazole (PRILOSEC) 20 MG capsule Take 1 capsule by mouth once daily 90 capsule 0  . traZODone (DESYREL) 50 MG tablet Take 0.5-1 tablets (25-50 mg total) by mouth at bedtime as needed for sleep. 90 tablet 3  . vitamin B-12 (CYANOCOBALAMIN) 1000 MCG tablet Take 1,000 mcg by mouth daily.    . vitamin E 100 UNIT capsule Take 100 Units by mouth daily.     No current facility-administered medications for this visit.    OBJECTIVE: There were no vitals filed for this visit.   There is no height or weight on file to calculate BMI.    ECOG FS:0 - Asymptomatic  General: Well-developed, well-nourished, no acute distress. HEENT: Normocephalic. Neuro: Alert, answering all questions appropriately. Cranial nerves grossly intact. Psych: Normal affect.  LAB RESULTS:  Lab Results  Component Value Date   NA 139 02/16/2020   K 4.3 02/16/2020   CL 102 02/16/2020   CO2 23 02/16/2020   GLUCOSE 90 02/16/2020   BUN 22 02/16/2020   CREATININE 1.21 (H) 02/16/2020   CALCIUM 9.7 02/16/2020   PROT 7.7 02/16/2020   ALBUMIN 4.7 02/16/2020   AST 25 02/16/2020   ALT 32 02/16/2020   ALKPHOS 98 02/16/2020   BILITOT <0.2 02/16/2020   GFRNONAA 47 (L) 02/16/2020   GFRAA 54 (L) 02/16/2020    Lab Results  Component Value Date   WBC 6.7 03/14/2020   NEUTROABS 3.3 03/14/2020   HGB 10.3 (L) 03/14/2020   HCT 31.2 (L) 03/14/2020   MCV 94.0 03/14/2020   PLT 236 03/14/2020   Lab Results  Component Value Date   IRON 25 (L) 03/14/2020   TIBC 214 (L) 03/14/2020   IRONPCTSAT 12 03/14/2020   Lab Results  Component Value Date    FERRITIN 170 03/14/2020     STUDIES: No results found.  ASSESSMENT: Elevated ferritin.  PLAN:    1. Elevated ferritin: Patient's ferritin levels are now within normal limits.  Possibly elevated secondary acute phase reactant.  She has a mild iron deficiency anemia with a decreased total iron and iron saturation.  Hemochromatosis mutation is negative.   2.  Iron deficiency anemia: Patient's hemoglobin remains decreased to 10.3, but essentially stable.  Patient reports she has been "anemic all my life".  Ferritin has returned to normal, but total iron and iron saturation are decreased.  The remainder of her laboratory work is either negative or within normal limits.  Patient has been instructed to reinitiate oral iron supplementation.  Return to clinic in 4 months with  repeat laboratory work, which will include hemoglobin electrophoresis, and further evaluation via video assisted telemedicine visit.    I provided 20 minutes of face-to-face video visit time during this encounter which included chart review, counseling, and coordination of care as documented above.   Patient expressed understanding and was in agreement with this plan. She also understands that She can call clinic at any time with any questions, concerns, or complaints.    Lloyd Huger, MD   04/04/2020 4:18 PM

## 2020-04-04 ENCOUNTER — Encounter: Payer: Self-pay | Admitting: Oncology

## 2020-04-04 ENCOUNTER — Inpatient Hospital Stay: Payer: Medicare HMO | Attending: Oncology | Admitting: Oncology

## 2020-04-04 DIAGNOSIS — R7989 Other specified abnormal findings of blood chemistry: Secondary | ICD-10-CM | POA: Diagnosis not present

## 2020-04-09 ENCOUNTER — Telehealth: Payer: Self-pay | Admitting: Nurse Practitioner

## 2020-04-09 ENCOUNTER — Other Ambulatory Visit: Payer: Self-pay | Admitting: Nurse Practitioner

## 2020-04-09 MED ORDER — TRAZODONE HCL 100 MG PO TABS: 100.0000 mg | ORAL_TABLET | Freq: Every day | ORAL | 5 refills | Status: DC

## 2020-04-09 NOTE — Telephone Encounter (Signed)
Routing to provider  

## 2020-04-09 NOTE — Telephone Encounter (Signed)
Have sent in an increase since she has visit in 6 days, please let her know to keep this visit for follow-up.  Thank you.

## 2020-04-09 NOTE — Telephone Encounter (Signed)
Pt called stating that the trazodone that she has been taking is not seeming to work for her. Pt is requesting to have the dose increased to help. Please advise.    [facility] [place] [facility]) [place] [address]  Phone: [phone] Fax: [phone]  Not a 24 hour pharmacy; exact hours not known.

## 2020-04-09 NOTE — Telephone Encounter (Signed)
Patient notified and verbalized understanding. Requested to cancel appointment as she states she cannot get here by 3:45. Patient states she is going to call back and reschedule ASAP though.

## 2020-04-11 ENCOUNTER — Telehealth: Payer: Self-pay | Admitting: Nurse Practitioner

## 2020-04-11 ENCOUNTER — Other Ambulatory Visit: Payer: Self-pay | Admitting: Nurse Practitioner

## 2020-04-11 MED ORDER — KETOCONAZOLE 2 % EX CREA: 1.0000 "application " | TOPICAL_CREAM | Freq: Every day | CUTANEOUS | 0 refills | Status: DC

## 2020-04-11 NOTE — Telephone Encounter (Signed)
Please advise 

## 2020-04-11 NOTE — Telephone Encounter (Signed)
Sent in Ketoconazole, but recommend if ongoing she return to office for further assessment.

## 2020-04-11 NOTE — Telephone Encounter (Signed)
Copied from [facility] [phone]. Topic: General - Other >> Apr 11, 2020 10:20 AM Leward Quan A wrote: Reason for CRM: Patient called to inform Kelly Poole that the mupirocin ointment (BACTROBAN) 2 % is not doing anything for her. Per patient she heard of  Ketoconazole and Clobetasol cream asking if an Rx could be sent to the pharmacy for her. Patient is asking for a call back at  [phone]

## 2020-04-11 NOTE — Telephone Encounter (Signed)
Patient notified

## 2020-04-15 ENCOUNTER — Ambulatory Visit: Payer: Medicare HMO | Admitting: Nurse Practitioner

## 2020-04-17 ENCOUNTER — Other Ambulatory Visit: Payer: Self-pay

## 2020-04-17 ENCOUNTER — Ambulatory Visit (INDEPENDENT_AMBULATORY_CARE_PROVIDER_SITE_OTHER): Payer: Medicare HMO | Admitting: Nurse Practitioner

## 2020-04-17 ENCOUNTER — Encounter: Payer: Self-pay | Admitting: Nurse Practitioner

## 2020-04-17 VITALS — BP 132/80 | HR 82 | Temp 98.5°F | Wt 147.2 lb

## 2020-04-17 DIAGNOSIS — I10 Essential (primary) hypertension: Secondary | ICD-10-CM | POA: Diagnosis not present

## 2020-04-17 DIAGNOSIS — M1711 Unilateral primary osteoarthritis, right knee: Secondary | ICD-10-CM

## 2020-04-17 MED ORDER — TRIAMCINOLONE ACETONIDE 40 MG/ML IJ SUSP
40.0000 mg | Freq: Once | INTRAMUSCULAR | Status: AC
  Administered 2020-04-17: 40 mg via INTRAMUSCULAR

## 2020-04-17 NOTE — Assessment & Plan Note (Signed)
Chronic, ongoing with some improvement with addition of HCTZ.  Initial BP elevated, repeat improved and home BP at goal.  Will continue Losartan 100 MG and HCTZ 25 MG, has taken this in past and tolerates well.  Recommend she monitor BP at home at least as few mornings a week and document, report to provider if consistent elevation >130/80.  BMP today.  Return in 3 months.

## 2020-04-17 NOTE — Assessment & Plan Note (Signed)
Chronic right knee pain, was to see ortho Friday for steroid injection, but would prefer done in office today.  Educated on risks/benefits of steroid injection + realistic education on length of time of effectiveness, which varies person to person.  She provided verbal consent for injection and agreed to immediately go to ER if any complications present.  Instructed to apply ice to knee every 2 hours for 24 hours and ensure to move often.  Steroid injection provided in office today.  Continue Tylenol and Voltaren gel as needed at home + wearing knee brace.  Use Meloxicam minimally due to HTN.  Return to office for worsening or ongoing symptoms.

## 2020-04-17 NOTE — Patient Instructions (Signed)
Knee Injection A knee injection is a procedure to get medicine into your knee joint to relieve the pain, swelling, and stiffness of arthritis. Your health care provider uses a needle to inject medicine, which may also help to lubricate and cushion your knee joint. You may need more than one injection. Tell a health care provider about:  Any allergies you have.  All medicines you are taking, including vitamins, herbs, eye drops, creams, and over-the-counter medicines.  Any problems you or family members have had with anesthetic medicines.  Any blood disorders you have.  Any surgeries you have had.  Any medical conditions you have.  Whether you are pregnant or may be pregnant. What are the risks? Generally, this is a safe procedure. However, problems may occur, including:  Infection.  Bleeding.  Symptoms that get worse.  Damage to the area around your knee.  Allergic reaction to any of the medicines.  Skin reactions from repeated injections. What happens before the procedure?  Ask your health care provider about changing or stopping your regular medicines. This is especially important if you are taking diabetes medicines or blood thinners.  Plan to have someone take you home from the hospital or clinic. What happens during the procedure?   You will sit or lie down in a position for your knee to be treated.  The skin over your kneecap will be cleaned with a germ-killing soap.  You will be given a medicine that numbs the area (local anesthetic). You may feel some stinging.  The medicine will be injected into your knee. The needle is carefully placed between your kneecap and your knee. The medicine is injected into the joint space.  The needle will be removed at the end of the procedure.  A bandage (dressing) may be placed over the injection site. The procedure may vary among health care providers and hospitals. What can I expect after the procedure?  Your blood  pressure, heart rate, breathing rate, and blood oxygen level will be monitored until you leave the hospital or clinic.  You may have to move your knee through its full range of motion. This helps to get all the medicine into your joint space.  You will be watched to make sure that you do not have a reaction to the injected medicine.  You may feel more pain, swelling, and warmth than you did before the injection. This reaction may last about 1-2 days. Follow these instructions at home: Medicines  Take over-the-counter and prescription medicines only as told by your doctor.  Do not drive or use heavy machinery while taking prescription pain medicine.  Do not take medicines such as aspirin and ibuprofen unless your health care provider tells you to take them. Injection site care  Follow instructions from your health care provider about: ? How to take care of your puncture site. ? When and how you should change your dressing. ? When you should remove your dressing.  Check your injection area every day for signs of infection. Check for: ? More redness, swelling, or pain after 2 days. ? Fluid or blood. ? Pus or a bad smell. ? Warmth. Managing pain, stiffness, and swelling   If directed, put ice on the injection area: ? Put ice in a plastic bag. ? Place a towel between your skin and the bag. ? Leave the ice on for 20 minutes, 2-3 times per day.  Do not apply heat to your knee.  Raise (elevate) the injection area above the level   of your heart while you are sitting or lying down. General instructions  If you were given a dressing, keep it dry until your health care provider says it can be removed. Ask your health care provider when you can start showering or taking a bath.  Avoid strenuous activities for as long as directed by your health care provider. Ask your health care provider when you can return to your normal activities.  Keep all follow-up visits as told by your health  care provider. This is important. You may need more injections. Contact a health care provider if you have:  A fever.  Warmth in your injection area.  Fluid, blood, or pus coming from your injection site.  Symptoms at your injection site that last longer than 2 days after your procedure. Get help right away if:  Your knee: ? Turns very red. ? Becomes very swollen. ? Is in severe pain. Summary  A knee injection is a procedure to get medicine into your knee joint to relieve the pain, swelling, and stiffness of arthritis.  A needle is carefully placed between your kneecap and your knee to inject medicine into the joint space.  Before the procedure, ask your health care provider about changing or stopping your regular medicines, especially if you are taking diabetes medicines or blood thinners.  Contact your health care provider if you have any problems or questions after your procedure. This information is not intended to replace advice given to you by your health care provider. Make sure you discuss any questions you have with your health care provider. Document Revised: 12/27/2017 Document Reviewed: 12/27/2017 Elsevier Patient Education  2020 [employer] Inc.  

## 2020-04-17 NOTE — Progress Notes (Signed)
BP 132/80 (BP Location: Left Arm)   Pulse 82   Temp 98.5 F (36.9 C) (Oral)   Wt 147 lb 3.2 oz (66.8 kg)   SpO2 95%   BMI 31.76 kg/m    Subjective:    Patient ID: Kelly Poole, female    DOB: 1951-09-28, 69 y.o.   MRN: [id number]  HPI: Kelly Poole is a 69 y.o. female  Chief Complaint  Patient presents with  . Hypertension   HYPERTENSION Continues on Losartan 100 MG and added HCTZ 25 MG last visit due to elevation in BP.  Reports no ADR with this.  Has been going to gym and working out. Hypertension status: stable  Satisfied with current treatment? yes Duration of hypertension: chronic BP monitoring frequency:  a few times a week BP range: 130/60-70 BP medication side effects:  no Medication compliance: good compliance Aspirin: yes Recurrent headaches: no Visual changes: no Palpitations: no Dyspnea: no Chest pain: no Lower extremity edema: no Dizzy/lightheaded: no   KNEE PAIN Has OA in right knee, reports she is to see ortho Friday for cortisone shot, but would like done in office here today if possible.  Ortho gave her Meloxicam, but she reports no relief with this.   Duration: chronic Involved knee: right Mechanism of injury: unknown Location:medial Onset: gradual Severity: 8/10 -- at night Quality:  dull, aching and throbbing Frequency: intermittent Radiation: no Aggravating factors: movement and prolonged sitting  Alleviating factors: Meloxicam, ice, APAP and rest  Status: fluctuating Treatments attempted: ice, APAP and ibuprofen  Relief with NSAIDs?:  no Weakness with weight bearing or walking: no Sensation of giving way: no Locking: no Popping: no Bruising: no Swelling: occasional Redness: no Paresthesias/decreased sensation: no Fevers: no  Relevant past medical, surgical, family and social history reviewed and updated as indicated. Interim medical history since our last visit reviewed. Allergies and medications reviewed and  updated.  Review of Systems  Constitutional: Negative for activity change, appetite change, diaphoresis, fatigue and fever.  Respiratory: Negative for cough, chest tightness and shortness of breath.   Cardiovascular: Negative for chest pain, palpitations and leg swelling.  Gastrointestinal: Negative.   Musculoskeletal: Positive for arthralgias.  Neurological: Negative.   Psychiatric/Behavioral: Negative.     Per HPI unless specifically indicated above     Objective:    BP 132/80 (BP Location: Left Arm)   Pulse 82   Temp 98.5 F (36.9 C) (Oral)   Wt 147 lb 3.2 oz (66.8 kg)   SpO2 95%   BMI 31.76 kg/m   Wt Readings from Last 3 Encounters:  04/17/20 147 lb 3.2 oz (66.8 kg)  03/15/20 148 lb 12.8 oz (67.5 kg)  03/14/20 149 lb (67.6 kg)    Physical Exam Vitals and nursing note reviewed.  Constitutional:      General: She is awake. She is not in acute distress.    Appearance: She is well-developed, well-groomed and overweight. She is not ill-appearing.  HENT:     Head: Normocephalic.     Right Ear: Hearing normal.     Left Ear: Hearing normal.  Eyes:     General: Lids are normal.        Right eye: No discharge.        Left eye: No discharge.     Conjunctiva/sclera: Conjunctivae normal.     Pupils: Pupils are equal, round, and reactive to light.  Neck:     Thyroid: No thyromegaly.     Vascular: No carotid bruit.  Cardiovascular:     Rate and Rhythm: Normal rate and regular rhythm.     Heart sounds: Normal heart sounds. No murmur. No gallop.   Pulmonary:     Effort: Pulmonary effort is normal. No accessory muscle usage or respiratory distress.     Breath sounds: Normal breath sounds.  Abdominal:     General: Bowel sounds are normal.     Palpations: Abdomen is soft.  Musculoskeletal:     Cervical back: Normal range of motion and neck supple.     Right knee: Crepitus present. No swelling, erythema, ecchymosis, lacerations or bony tenderness. Normal range of motion. No  tenderness.     Left knee: Normal.     Right lower leg: No edema.     Left lower leg: No edema.  Skin:    General: Skin is warm and dry.  Neurological:     Mental Status: She is alert and oriented to person, place, and time.  Psychiatric:        Attention and Perception: Attention normal.        Mood and Affect: Mood normal.        Speech: Speech normal.        Behavior: Behavior normal. Behavior is cooperative.        Thought Content: Thought content normal.    STEROID INJECTION Procedure: Right Knee Intraarticular Steroid Injection  Description: After verbal consent and patient education on procedure, area prepped and draped using semi-sterile technique. Using an anterior  approach, a mixture of 4 cc of  1% Marcaine & 1 cc of Kenalog 40 was injected into knee joint.  A bandage was then placed over the injection site. Complications:  none Post Procedure Instructions: To the ER if any symptoms of erythema or swelling.   Follow Up: PRN  Results for orders placed or performed in visit on 03/14/20  Folate  Result Value Ref Range   Folate 52.2 >5.9 ng/mL  Hemochromatosis DNA-PCR(c282y,h63d)  Result Value Ref Range   DNA Mutation Analysis Comment   Iron and TIBC  Result Value Ref Range   Iron 25 (L) 28 - 170 ug/dL   TIBC 214 (L) 250 - 450 ug/dL   Saturation Ratios 12 10.4 - 31.8 %   UIBC 189 ug/dL  Ferritin  Result Value Ref Range   Ferritin 170 11 - 307 ng/mL  CBC with Differential/Platelet  Result Value Ref Range   WBC 6.7 4.0 - 10.5 K/uL   RBC 3.32 (L) 3.87 - 5.11 MIL/uL   Hemoglobin 10.3 (L) 12.0 - 15.0 g/dL   HCT 31.2 (L) 36.0 - 46.0 %   MCV 94.0 80.0 - 100.0 fL   MCH 31.0 26.0 - 34.0 pg   MCHC 33.0 30.0 - 36.0 g/dL   RDW 13.0 11.5 - 15.5 %   Platelets 236 150 - 400 K/uL   nRBC 0.0 0.0 - 0.2 %   Neutrophils Relative % 49 %   Neutro Abs 3.3 1.7 - 7.7 K/uL   Lymphocytes Relative 32 %   Lymphs Abs 2.1 0.7 - 4.0 K/uL   Monocytes Relative 14 %   Monocytes Absolute  0.9 0.1 - 1.0 K/uL   Eosinophils Relative 5 %   Eosinophils Absolute 0.3 0.0 - 0.5 K/uL   Basophils Relative 0 %   Basophils Absolute 0.0 0.0 - 0.1 K/uL   Immature Granulocytes 0 %   Abs Immature Granulocytes 0.01 0.00 - 0.07 K/uL      Assessment & Plan:  Problem List Items Addressed This Visit      Cardiovascular and Mediastinum   Hypertension - Primary    Chronic, ongoing with some improvement with addition of HCTZ.  Initial BP elevated, repeat improved and home BP at goal.  Will continue Losartan 100 MG and HCTZ 25 MG, has taken this in past and tolerates well.  Recommend she monitor BP at home at least as few mornings a week and document, report to provider if consistent elevation >130/80.  BMP today.  Return in 3 months.      Relevant Orders   Basic metabolic panel     Musculoskeletal and Integument   Osteoarthritis of right knee    Chronic right knee pain, was to see ortho Friday for steroid injection, but would prefer done in office today.  Educated on risks/benefits of steroid injection + realistic education on length of time of effectiveness, which varies person to person.  She provided verbal consent for injection and agreed to immediately go to ER if any complications present.  Instructed to apply ice to knee every 2 hours for 24 hours and ensure to move often.  Steroid injection provided in office today.  Continue Tylenol and Voltaren gel as needed at home + wearing knee brace.  Use Meloxicam minimally due to HTN.  Return to office for worsening or ongoing symptoms.         Time: 25 minutes, >50% spent counseling on steroid injection and time for injection period.    Follow up plan: Return in about 3 months (around 07/17/2020), or HTN, Anemia, HLD, GERD.

## 2020-04-18 ENCOUNTER — Encounter: Payer: Self-pay | Admitting: Nurse Practitioner

## 2020-04-18 ENCOUNTER — Telehealth: Payer: Self-pay | Admitting: Nurse Practitioner

## 2020-04-18 LAB — BASIC METABOLIC PANEL
BUN/Creatinine Ratio: 17 (ref 12–28)
BUN: 26 mg/dL (ref 8–27)
CO2: 21 mmol/L (ref 20–29)
Calcium: 9.7 mg/dL (ref 8.7–10.3)
Chloride: 104 mmol/L (ref 96–106)
Creatinine, Ser: 1.5 mg/dL — ABNORMAL HIGH (ref 0.57–1.00)
GFR calc Af Amer: 42 mL/min/{1.73_m2} — ABNORMAL LOW (ref >59)
GFR calc non Af Amer: 36 mL/min/{1.73_m2} — ABNORMAL LOW (ref >59)
Glucose: 95 mg/dL (ref 65–99)
Potassium: 4.6 mmol/L (ref 3.5–5.2)
Sodium: 138 mmol/L (ref 134–144)

## 2020-04-18 NOTE — Telephone Encounter (Signed)
-----   Message from Venita Lick, NP sent at 04/18/2020 12:02 PM EDT ----- Needs follow-up scheduled for 4 weeks please, please tell her to review mychart message.  Contacted via MyChart:  Good afternoon Ms. Kelly Poole.  Your labs have returned and the Hydrochlorothiazide has dropped your kidney function (increased your creatinine and decreased your GFR).  I would like you to try cutting the Hydrochlorothiazide in 1/2 (taking 12.5 MG).  Monitor blood pressures and then I want you to return in 4 weeks to see if improvement in labs with lower dose and if BP is still stable.  If not, then we may have to discontinue this and bring in a beta blocker to help lower BP along with your Losartan.  Let me know you received this message and if any questions.   Keep being awesome!! Kindest regards, Jolene

## 2020-04-18 NOTE — Telephone Encounter (Signed)
Called pt to schedule 4 week lab appt, no answer, left vm

## 2020-04-18 NOTE — Progress Notes (Signed)
Needs follow-up scheduled for 4 weeks please, please tell her to review mychart message.  Contacted via MyChart:  Good afternoon Ms. Cherlyn Cushing.  Your labs have returned and the Hydrochlorothiazide has dropped your kidney function (increased your creatinine and decreased your GFR).  I would like you to try cutting the Hydrochlorothiazide in 1/2 (taking 12.5 MG).  Monitor blood pressures and then I want you to return in 4 weeks to see if improvement in labs with lower dose and if BP is still stable.  If not, then we may have to discontinue this and bring in a beta blocker to help lower BP along with your Losartan.  Let me know you received this message and if any questions.   Keep being awesome!! Kindest regards, Eria Lozoya

## 2020-04-19 ENCOUNTER — Encounter: Payer: Self-pay | Admitting: Nurse Practitioner

## 2020-04-19 ENCOUNTER — Telehealth: Payer: Medicare HMO | Admitting: General Practice

## 2020-04-19 ENCOUNTER — Ambulatory Visit (INDEPENDENT_AMBULATORY_CARE_PROVIDER_SITE_OTHER): Payer: Medicare HMO | Admitting: General Practice

## 2020-04-19 DIAGNOSIS — N1831 Chronic kidney disease, stage 3a: Secondary | ICD-10-CM | POA: Diagnosis not present

## 2020-04-19 DIAGNOSIS — E7800 Pure hypercholesterolemia, unspecified: Secondary | ICD-10-CM

## 2020-04-19 DIAGNOSIS — I10 Essential (primary) hypertension: Secondary | ICD-10-CM

## 2020-04-19 DIAGNOSIS — M1711 Unilateral primary osteoarthritis, right knee: Secondary | ICD-10-CM | POA: Diagnosis not present

## 2020-04-19 DIAGNOSIS — M25561 Pain in right knee: Secondary | ICD-10-CM

## 2020-04-19 NOTE — Chronic Care Management (AMB) (Signed)
Chronic Care Management   Initial Visit Note  04/19/2020 Name: Kelly Poole MRN: [id number] DOB: Jan 08, 1951  Referred by: Venita Lick, NP Reason for referral : Chronic Care Management (Initial 2nd: HTN/HLD/CKD3- pain in knee)   Kelly Poole is a 69 y.o. year old female who is a primary care patient of Cannady, Barbaraann Faster, NP. The CCM team was consulted for assistance with chronic disease management and care coordination needs related to HTN, HLD, CKD Stage 3 and Chronic arthritis pain in knee  Review of patient status, including review of consultants reports, relevant laboratory and other test results, and collaboration with appropriate care team members and the patient's provider was performed as part of comprehensive patient evaluation and provision of chronic care management services.    SDOH (Social Determinants of Health) assessments performed: Yes See Care Plan activities for detailed interventions related to SDOH  SDOH Interventions     Most Recent Value  SDOH Interventions  SDOH Interventions for the Following Domains  Social Connections  Social Connections Interventions  Other (Comment) [The patient has a good support system]       Medications: Outpatient Encounter Medications as of 04/19/2020  Medication Sig Note   Ascorbic Acid (VITAMIN C) 1000 MG tablet Take 1,000 mg by mouth daily.    aspirin 81 MG tablet Take 81 mg by mouth daily.    atorvastatin (LIPITOR) 10 MG tablet Take 1 tablet by mouth once daily (Patient not taking: Reported on 04/17/2020)    Cetirizine HCl (ALLERGY RELIEF) 10 MG CAPS Take by mouth. 10/12/2019: Allergy relief   ferrous sulfate 325 (65 FE) MG tablet Take 325 mg by mouth daily with breakfast.    Garlic 6945 MG CAPS Take 1,000 mg by mouth daily. 10/12/2019: Doesn't take regular   hydrochlorothiazide (HYDRODIURIL) 25 MG tablet Take 1 tablet (25 mg total) by mouth daily.    ketoconazole (NIZORAL) 2 % cream Apply 1 application  topically daily. (Patient not taking: Reported on 04/17/2020)    losartan (COZAAR) 100 MG tablet Take 1 tablet (100 mg total) by mouth daily.    meloxicam (MOBIC) 7.5 MG tablet Take 7.5 mg by mouth daily.    Multiple Vitamin (MULTIVITAMINS PO) Take by mouth daily.    omeprazole (PRILOSEC) 20 MG capsule Take 1 capsule by mouth once daily (Patient not taking: Reported on 04/17/2020)    traZODone (DESYREL) 100 MG tablet Take 1 tablet (100 mg total) by mouth at bedtime.    vitamin B-12 (CYANOCOBALAMIN) 1000 MCG tablet Take 1,000 mcg by mouth daily.    vitamin E 100 UNIT capsule Take 100 Units by mouth daily.    No facility-administered encounter medications on file as of 04/19/2020.     Objective: BP Readings from Last 3 Encounters:  04/17/20 132/80  03/15/20 140/88  03/14/20 (!) 185/96   Lab Results  Component Value Date   CREATININE 1.50 (H) 04/17/2020   BUN 26 04/17/2020   NA 138 04/17/2020   K 4.6 04/17/2020   CL 104 04/17/2020   CO2 21 04/17/2020      Goals Addressed            This Visit's Progress    RNCM-Pt-"I take my blood pressure but it is the wrist cuff" (pt-stated)       CARE PLAN ENTRY (see longtitudinal plan of care for additional care plan information)  Current Barriers:   Chronic Disease Management support, education, and care coordination needs related to HTN, HLD, and Chronic arthritis pain (  knee)  Clinical Goal(s) related to HTN, HLD, and Chronic arthritis pain (knee):  Over the next 120 days, patient will:   Work with the care management team to address educational, disease management, and care coordination needs   Begin or continue self health monitoring activities as directed today Measure and record blood pressure 2 times per week and adhere to a heart healthy/renal diet   Call provider office for new or worsened signs and symptoms Blood pressure findings outside established parameters and New or worsened symptom related to HLD, chronic  arthritis pain and other chronic conditions.  Call care management team with questions or concerns  Verbalize basic understanding of patient centered plan of care established today  Interventions related to HTN, HLD, and Chronic arthritis pain (knee):   Evaluation of current treatment plans and patient's adherence to plan as established by provider  Assessed patient understanding of disease states.  The patient has a good understanding of her conditions. 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 Talked about taking care of self and making time for her to have time for herself.   Assessed patient's education and care coordination needs. Education given to the patient on the normal range for blood pressures. The patient states she has it pretty well managed right now. The patient has a wrist cuff and admits lots of times that it may be off. Denies headaches when her pressures are high. Education to be sent by MyChart and EMMI  Provided disease specific education to patient: Educational material to be sent to the patient on dietary restrictions and other requested information from the patient.   Collaborated with appropriate clinical care team members regarding patient needs  Patient Self Care Activities related toHTN, HLD, and Chronic arthritis pain (knee):   Patient is unable to independently self-manage chronic health conditions  Initial goal documentation      RNCM: Pt-"I am really concerned about my kidneys" (pt-stated)       CARE PLAN ENTRY (see longitudinal plan of care for additional care plan information)  Current Barriers:   Knowledge Deficits related to kidney function and medication of hydrochlorothiazide decreasing her kidney function  Care Coordination needs related to educational needs in a patient with CKD III (disease states)  Chronic Disease Management support and education needs related to renal function and abnormal labs related to medications and the  patient wanting to understand her conditions better  Nurse Case Manager Clinical Goal(s):   Over the next 120 days, patient will verbalize understanding of plan for CKDIII  Over the next 120 days, patient will work with [facility], CCM team and pcp to address needs related to kidney function and understanding CKD III  Over the next 120 days, patient will attend all scheduled medical appointments: pcp follow up 07/30/2020 with lab work follow up on 05/16/2020 for repeat labs related to kidney function  Over the next 120 days, patient will verbalize basic understanding of CKDIII disease process and self health management plan as evidenced by understanding kidney function, how life style changes and diet can help in maintaining healthy kidney function.  Over the next 120 days, patient will demonstrate understanding of rationale for each prescribed medication as evidenced by compliance and review of medications with RNCM and pharmacist   Over the next 120 days, patient will work with CM team pharmacist to understand BETA Blockers better and help with questions she has related to the medications she takes.   Interventions:   Inter-disciplinary care team collaboration (see  longitudinal plan of care)  Evaluation of current treatment plan related to CKDIII and patient's adherence to plan as established by provider.  Advised patient to follow recommendations of pcp with decreasing the dose of Hydrochlorothiazide and having follow up labs on 05/16/2020  Provided education to patient re: renal function, CKDIII, and dietary restrictions for renal diet  Reviewed medications with patient and discussed compliance. The patient ask question about the Hydrochlorothiazide and if decreasing the dose what does a BETA blocker mean?  Education provided and will do a pharmacy referral   Discussed plans with patient for ongoing care management follow up and provided patient with direct contact information for care  management team  Pharmacy referral for education and support on possibly having the start taking a BETA blocker if the reduced dose of hydrochlorothiazide does not work.  Patient Self Care Activities:   Patient verbalizes understanding of plan to work with CCM team to help her meet her health and wellness needs  Self administers medications as prescribed  Attends all scheduled provider appointments  Calls provider office for new concerns or questions  Unable to independently manage CKDIII  Initial goal documentation         Ms. Hiraldo was given information about Chronic Care Management services today including:  1. CCM service includes personalized support from designated clinical staff supervised by her physician, including individualized plan of care and coordination with other care providers 2. 24/7 contact phone numbers for assistance for urgent and routine care needs. 3. Service will only be billed when office clinical staff spend 20 minutes or more in a month to coordinate care. 4. Only one practitioner may furnish and bill the service in a calendar month. 5. The patient may stop CCM services at any time (effective at the end of the month) by phone call to the office staff. 6. The patient will be responsible for cost sharing (co-pay) of up to 20% of the service fee (after annual deductible is met).  Patient agreed to services and verbal consent obtained.   Plan:   The care management team will reach out to the patient again over the next 60 days.   Noreene Larsson RN, MSN, [facility] Mobile: [phone]

## 2020-04-19 NOTE — Patient Instructions (Signed)
Visit Information  Goals Addressed            This Visit's Progress    RNCM-Pt-"I take my blood pressure but it is the wrist cuff" (pt-stated)       CARE PLAN ENTRY (see longtitudinal plan of care for additional care plan information)  Current Barriers:   Chronic Disease Management support, education, and care coordination needs related to HTN, HLD, and Chronic arthritis pain (knee)  Clinical Goal(s) related to HTN, HLD, and Chronic arthritis pain (knee):  Over the next 120 days, patient will:   Work with the care management team to address educational, disease management, and care coordination needs   Begin or continue self health monitoring activities as directed today Measure and record blood pressure 2 times per week and adhere to a heart healthy/renal diet   Call provider office for new or worsened signs and symptoms Blood pressure findings outside established parameters and New or worsened symptom related to HLD, chronic arthritis pain and other chronic conditions.  Call care management team with questions or concerns  Verbalize basic understanding of patient centered plan of care established today  Interventions related to HTN, HLD, and Chronic arthritis pain (knee):   Evaluation of current treatment plans and patient's adherence to plan as established by provider  Assessed patient understanding of disease states.  The patient has a good understanding of her conditions. 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 Talked about taking care of self and making time for her to have time for herself.   Assessed patient's education and care coordination needs. Education given to the patient on the normal range for blood pressures. The patient states she has it pretty well managed right now. The patient has a wrist cuff and admits lots of times that it may be off. Denies headaches when her pressures are high. Education to be sent by MyChart and EMMI  Provided disease  specific education to patient: Educational material to be sent to the patient on dietary restrictions and other requested information from the patient.   Collaborated with appropriate clinical care team members regarding patient needs  Patient Self Care Activities related toHTN, HLD, and Chronic arthritis pain (knee):   Patient is unable to independently self-manage chronic health conditions  Initial goal documentation      RNCM: Pt-"I am really concerned about my kidneys" (pt-stated)       CARE PLAN ENTRY (see longitudinal plan of care for additional care plan information)  Current Barriers:   Knowledge Deficits related to kidney function and medication of hydrochlorothiazide decreasing her kidney function  Care Coordination needs related to educational needs in a patient with CKD III (disease states)  Chronic Disease Management support and education needs related to renal function and abnormal labs related to medications and the patient wanting to understand her conditions better  Nurse Case Manager Clinical Goal(s):   Over the next 120 days, patient will verbalize understanding of plan for CKDIII  Over the next 120 days, patient will work with [facility], CCM team and pcp to address needs related to kidney function and understanding CKD III  Over the next 120 days, patient will attend all scheduled medical appointments: pcp follow up 07/30/2020 with lab work follow up on 05/16/2020 for repeat labs related to kidney function  Over the next 120 days, patient will verbalize basic understanding of CKDIII disease process and self health management plan as evidenced by understanding kidney function, how life style changes and diet can help  in maintaining healthy kidney function.  Over the next 120 days, patient will demonstrate understanding of rationale for each prescribed medication as evidenced by compliance and review of medications with RNCM and pharmacist   Over the next 120 days,  patient will work with CM team pharmacist to understand BETA Blockers better and help with questions she has related to the medications she takes.   Interventions:   Inter-disciplinary care team collaboration (see longitudinal plan of care)  Evaluation of current treatment plan related to CKDIII and patient's adherence to plan as established by provider.  Advised patient to follow recommendations of pcp with decreasing the dose of Hydrochlorothiazide and having follow up labs on 05/16/2020  Provided education to patient re: renal function, CKDIII, and dietary restrictions for renal diet  Reviewed medications with patient and discussed compliance. The patient ask question about the Hydrochlorothiazide and if decreasing the dose what does a BETA blocker mean?  Education provided and will do a pharmacy referral   Discussed plans with patient for ongoing care management follow up and provided patient with direct contact information for care management team  Pharmacy referral for education and support on possibly having the start taking a BETA blocker if the reduced dose of hydrochlorothiazide does not work.  Patient Self Care Activities:   Patient verbalizes understanding of plan to work with CCM team to help her meet her health and wellness needs  Self administers medications as prescribed  Attends all scheduled provider appointments  Calls provider office for new concerns or questions  Unable to independently manage CKDIII  Initial goal documentation        Ms. Saunders was given information about Chronic Care Management services today including:  1. CCM service includes personalized support from designated clinical staff supervised by her physician, including individualized plan of care and coordination with other care providers 2. 24/7 contact phone numbers for assistance for urgent and routine care needs. 3. Service will only be billed when office clinical staff spend 20 minutes  or more in a month to coordinate care. 4. Only one practitioner may furnish and bill the service in a calendar month. 5. The patient may stop CCM services at any time (effective at the end of the month) by phone call to the office staff. 6. The patient will be responsible for cost sharing (co-pay) of up to 20% of the service fee (after annual deductible is met).  Patient agreed to services and verbal consent obtained.   Patient verbalizes understanding of instructions provided today.   The care management team will reach out to the patient again over the next 60 days.   Noreene Larsson RN, MSN, [facility] Mobile: [phone]    Low-Purine Eating Plan A low-purine eating plan involves making food choices to limit your intake of purine. Purine is a kind of uric acid. Too much uric acid in your blood can cause certain conditions, such as gout and kidney stones. Eating a low-purine diet can help control these conditions. What are tips for following this plan? Reading food labels   Avoid foods with saturated or Trans fat.  Check the ingredient list of grains-based foods, such as bread and cereal, to make sure that they contain whole grains.  Check the ingredient list of sauces or soups to make sure they do not contain meat or fish.  When choosing soft drinks, check the ingredient list to make sure they do not contain  high-fructose corn syrup. Shopping  Buy plenty of fresh fruits and vegetables.  Avoid buying canned or fresh fish.  Buy dairy products labeled as low-fat or nonfat.  Avoid buying premade or processed foods. These foods are often high in fat, salt (sodium), and added sugar. Cooking  Use olive oil instead of butter when cooking. Oils like olive oil, canola oil, and sunflower oil contain healthy fats. Meal planning  Learn which foods do or do not affect you. If you find out that a food  tends to cause your gout symptoms to flare up, avoid eating that food. You can enjoy foods that do not cause problems. If you have any questions about a food item, talk with your dietitian or health care provider.  Limit foods high in fat, especially saturated fat. Fat makes it harder for your body to get rid of uric acid.  Choose foods that are lower in fat and are lean sources of protein. General guidelines  Limit alcohol intake to no more than 1 drink a day for nonpregnant women and 2 drinks a day for men. One drink equals 12 oz of beer, 5 oz of wine, or 1 oz of hard liquor. Alcohol can affect the way your body gets rid of uric acid.  Drink plenty of water to keep your urine clear or pale yellow. Fluids can help remove uric acid from your body.  If directed by your health care provider, take a vitamin C supplement.  Work with your health care provider and dietitian to develop a plan to achieve or maintain a healthy weight. Losing weight can help reduce uric acid in your blood. What foods are recommended? The items listed may not be a complete list. Talk with your dietitian about what dietary choices are best for you. Foods low in purines Foods low in purines do not need to be limited. These include:  All fruits.  All low-purine vegetables, pickles, and olives.  Breads, pasta, rice, cornbread, and popcorn. Cake and other baked goods.  All dairy foods.  Eggs, nuts, and nut butters.  Spices and condiments, such as salt, herbs, and vinegar.  Plant oils, butter, and margarine.  Water, sugar-free soft drinks, tea, coffee, and cocoa.  Vegetable-based soups, broths, sauces, and gravies. Foods moderate in purines Foods moderate in purines should be limited to the amounts listed.   cup of asparagus, cauliflower, spinach, mushrooms, or green peas, each day.  2/3 cup uncooked oatmeal, each day.   cup dry wheat bran or wheat germ, each day.  2-3 ounces of meat or poultry, each  day.  4-6 ounces of shellfish, such as crab, lobster, oysters, or shrimp, each day.  1 cup cooked beans, peas, or lentils, each day.  Soup, broths, or bouillon made from meat or fish. Limit these foods as much as possible. What foods are not recommended? The items listed may not be a complete list. Talk with your dietitian about what dietary choices are best for you. Limit your intake of foods high in purines, including:  Beer and other alcohol.  Meat-based gravy or sauce.  Canned or fresh fish, such as: ? Anchovies, sardines, herring, and tuna. ? Mussels and scallops. ? Codfish, trout, and haddock.  Berniece Salines.  Organ meats, such as: ? Liver or kidney. ? Tripe. ? Sweetbreads (thymus gland or pancreas).  Wild [employer] biochemist.  Yeast or yeast extract supplements.  Drinks sweetened with high-fructose corn syrup. Summary  Eating a low-purine diet can help control conditions caused by  too much uric acid in the body, such as gout or kidney stones.  Choose low-purine foods, limit alcohol, and limit foods high in fat.  You will learn over time which foods do or do not affect you. If you find out that a food tends to cause your gout symptoms to flare up, avoid eating that food. This information is not intended to replace advice given to you by your health care provider. Make sure you discuss any questions you have with your health care provider. Document Revised: 11/19/2017 Document Reviewed: 01/20/2017 Elsevier Patient Education  2020 [place] for Chronic Kidney Disease When your kidneys are not working well, they cannot remove waste and excess substances from your blood as effectively as they did before. This can lead to a buildup and imbalance of these substances, which can worsen kidney damage and affect how your body functions. Certain foods lead to a buildup of these substances in the body. By changing your diet as recommended by your diet and nutrition  specialist (dietitian) or health care provider, you could help prevent further kidney damage and delay or prevent the need for dialysis. What are tips for following this plan? General instructions   Work with your health care provider and dietitian to develop a meal plan that is right for you. Foods you can eat, limit, or avoid will be different for each person depending on the stage of kidney disease and any other existing health conditions.  Talk with your health care provider about whether you should take a vitamin and mineral supplement.  Use standard measuring cups and spoons to measure servings of foods. Use a kitchen scale to measure portions of protein foods.  If directed by your health care provider, avoid drinking too much fluid. Measure and count all liquids, including water, ice, soups, flavored gelatin, and frozen desserts such as popsicles or ice cream. Reading food labels  Check the amount of sodium in foods. Choose foods that have less than 300 milligrams (mg) per serving.  Check the ingredient list for phosphorus or potassium-based additives or preservatives.  Check the amount of saturated and trans fat. Limit or avoid these fats as told by your dietitian. Shopping  Avoid buying foods that are: ? Processed, frozen, or prepackaged. ? Calcium-enriched or fortified.  Do not buy foods that have salt or sodium listed among the first five ingredients.  Do not buy canned vegetables. Cooking  Replace animal proteins, such as meat, fish, eggs, or dairy, with plant proteins from beans, nuts, and soy. ? Use soy milk instead of cow's milk. ? Add beans or tofu to soups, casseroles, or pasta dishes instead of meat.  Soak vegetables, such as potatoes, before cooking to reduce potassium. To do this: ? Peel and cut into small pieces. ? Soak in warm water for at least 2 hours. For every 1 cup of vegetables, use 10 cups of water. ? Drain and rinse with warm water. ? Boil for at  least 5 minutes. Meal planning  Limit the amount of protein from plant and animal sources you eat each day.  Do not add salt to food when cooking or before eating.  Eat meals and snacks at around the same time each day. If you have diabetes:  If you have diabetes (diabetes mellitus) and chronic kidney disease, it is important to keep your blood glucose in the target range recommended by your health care provider. Follow your diabetes management plan. This may include: ? Checking your  blood glucose regularly. ? Taking oral medicines, insulin, or both. ? Exercising for at least 30 minutes on 5 or more days each week, or as told by your health care provider. ? Tracking how many servings of carbohydrates you eat at each meal.  You may be given specific guidelines on how much of certain foods and nutrients you may eat, depending on your stage of kidney disease and whether you have high blood pressure (hypertension). Follow your meal plan as told by your dietitian. What nutrients should be limited? The items listed are not a complete list. Talk with your dietitian about what dietary choices are best for you. Potassium Potassium affects how steadily your heart beats. If too much potassium builds up in your blood, it can cause an irregular heartbeat or even a heart attack. You may need to eat less potassium, depending on your blood potassium levels and the stage of kidney disease. Talk to your dietitian about how much potassium you may have each day. You may need to limit or avoid foods that are high in potassium, such as:  Milk and soy milk.  Fruits, such as bananas, papaya, apricots, nectarines, melon, prunes, raisins, kiwi, and oranges.  Vegetables, such as potatoes, sweet potatoes, yams, tomatoes, leafy greens, beets, okra, avocado, pumpkin, and winter squash.  White and lima beans. Phosphorus Phosphorus is a mineral found in your bones. A balance between calcium and phosphorous is needed  to build and maintain healthy bones. Too much phosphorus pulls calcium from your bones. This can make your bones weak and more likely to break. Too much phosphorus can also make your skin itch. You may need to eat less phosphorus depending on your blood phosphorus levels and the stage of kidney disease. Talk to your dietitian about how much potassium you may have each day. You may need to take medicine to lower your blood phosphorus levels if diet changes do not help. You may need to limit or avoid foods that are high in phosphorus, such as:  Milk and dairy products.  Dried beans and peas.  Tofu, soy milk, and other soy-based meat replacements.  Colas.  Nuts and peanut butter.  Meat, poultry, and fish.  Bran cereals and oatmeals. Protein Protein helps you to make and keep muscle. It also helps in the repair of your bodys cells and tissues. One of the natural breakdown products of protein is a waste product called urea. When your kidneys are not working properly, they cannot remove wastes, such as urea, like they did before you developed chronic kidney disease. Reducing how much protein you eat can help prevent a buildup of urea in your blood. Depending on your stage of kidney disease, you may need to limit foods that are high in protein. Sources of animal protein include:  Meat (all types).  Fish and seafood.  Poultry.  Eggs.  Dairy. Other protein foods include:  Beans and legumes.  Nuts and nut butter.  Soy and tofu. Sodium Sodium, which is found in salt, helps maintain a healthy balance of fluids in your body. Too much sodium can increase your blood pressure and have a negative effect on the function of your heart and lungs. Too much sodium can also cause your body to retain too much fluid, making your kidneys work harder. Most people should have less than 2,300 milligrams (mg) of sodium each day. If you have hypertension, you may need to limit your sodium to 1,500 mg each  day. Talk to your dietitian about  how much sodium you may have each day. You may need to limit or avoid foods that are high in sodium, such as:  Salt seasonings.  Soy sauce.  Cured and processed meats.  Salted crackers and snack foods.  Fast food.  Canned soups and most canned foods.  Pickled foods.  Vegetable juice.  Boxed mixes or ready-to-eat boxed meals and side dishes.  Bottled dressings, sauces, and marinades. Summary  Chronic kidney disease can lead to a buildup and imbalance of waste and excess substances in the body. Certain foods lead to a buildup of these substances. By adjusting your intake of these foods, you could help prevent more kidney damage and delay or prevent the need for dialysis.  Food adjustments are different for each person with chronic kidney disease. Work with a dietitian to set up nutrient goals and a meal plan that is right for you.  If you have diabetes and chronic kidney disease, it is important to keep your blood glucose in the target range recommended by your health care provider. This information is not intended to replace advice given to you by your health care provider. Make sure you discuss any questions you have with your health care provider. Document Revised: 03/30/2019 Document Reviewed: 12/02/2016 Elsevier Patient Education  [place].  Chronic Kidney Disease, Adult Chronic kidney disease (CKD) happens when the kidneys are damaged over a long period of time. The kidneys are two organs that help with:  Getting rid of waste and extra fluid from the blood.  Making hormones that maintain the amount of fluid in your tissues and blood vessels.  Making sure that the body has the right amount of fluids and chemicals. Most of the time, CKD does not go away, but it can usually be controlled. Steps must be taken to slow down the kidney damage or to stop it from getting worse. If this is not done, the kidneys may stop working. Follow  these instructions at home: Medicines  Take over-the-counter and prescription medicines only as told by your doctor. You may need to change the amount of medicines you take.  Do not take any new medicines unless your doctor says it is okay. Many medicines can make your kidney damage worse.  Do not take any vitamin and supplements unless your doctor says it is okay. Many vitamins and supplements can make your kidney damage worse. General instructions  Follow a diet as told by your doctor. You may need to stay away from: ? Alcohol. ? Salty foods. ? Foods that are high in:  Potassium.  Calcium.  Protein.  Do not use any products that contain nicotine or tobacco, such as cigarettes and e-cigarettes. If you need help quitting, ask your doctor.  Keep track of your blood pressure at home. Tell your doctor about any changes.  If you have diabetes, keep track of your blood sugar as told by your doctor.  Try to stay at a healthy weight. If you need help, ask your doctor.  Exercise at least 30 minutes a day, 5 days a week.  Stay up-to-date with your shots (immunizations) as told by your doctor.  Keep all follow-up visits as told by your doctor. This is important. Contact a doctor if:  Your symptoms get worse.  You have new symptoms. Get help right away if:  You have symptoms of end-stage kidney disease. These may include: ? Headaches. ? Numbness in your hands or feet. ? Easy bruising. ? Having hiccups often. ? Chest  pain. ? Shortness of breath. ? Stopping of menstrual periods in women.  You have a fever.  You have very little pee (urine).  You have pain or bleeding when you pee. Summary  Chronic kidney disease (CKD) happens when the kidneys are damaged over a long period of time.  Most of the time, this condition does not go away, but it can usually be controlled. Steps must be taken to slow down the kidney damage or to stop it from getting worse.  Treatment may  include a combination of medicines and lifestyle changes. This information is not intended to replace advice given to you by your health care provider. Make sure you discuss any questions you have with your health care provider. Document Revised: 11/19/2017 Document Reviewed: 01/11/2017 Elsevier Patient Education  2020 [place] Eating Plan DASH stands for "Dietary Approaches to Stop Hypertension." The DASH eating plan is a healthy eating plan that has been shown to reduce high blood pressure (hypertension). It may also reduce your risk for type 2 diabetes, heart disease, and stroke. The DASH eating plan may also help with weight loss. What are tips for following this plan?  General guidelines  Avoid eating more than 2,300 mg (milligrams) of salt (sodium) a day. If you have hypertension, you may need to reduce your sodium intake to 1,500 mg a day.  Limit alcohol intake to no more than 1 drink a day for nonpregnant women and 2 drinks a day for men. One drink equals 12 oz of beer, 5 oz of wine, or 1 oz of hard liquor.  Work with your health care provider to maintain a healthy body weight or to lose weight. Ask what an ideal weight is for you.  Get at least 30 minutes of exercise that causes your heart to beat faster (aerobic exercise) most days of the week. Activities may include walking, swimming, or biking.  Work with your health care provider or diet and nutrition specialist (dietitian) to adjust your eating plan to your individual calorie needs. Reading food labels   Check food labels for the amount of sodium per serving. Choose foods with less than 5 percent of the Daily Value of sodium. Generally, foods with less than 300 mg of sodium per serving fit into this eating plan.  To find whole grains, look for the word "whole" as the first word in the ingredient list. Shopping  Buy products labeled as "low-sodium" or "no salt added."  Buy fresh foods. Avoid canned foods and  premade or frozen meals. Cooking  Avoid adding salt when cooking. Use salt-free seasonings or herbs instead of table salt or sea salt. Check with your health care provider or pharmacist before using salt substitutes.  Do not fry foods. Cook foods using healthy methods such as baking, boiling, grilling, and broiling instead.  Cook with heart-healthy oils, such as olive, canola, soybean, or sunflower oil. Meal planning  Eat a balanced diet that includes: ? 5 or more servings of fruits and vegetables each day. At each meal, try to fill half of your plate with fruits and vegetables. ? Up to 6-8 servings of whole grains each day. ? Less than 6 oz of lean meat, poultry, or fish each day. A 3-oz serving of meat is about the same size as a deck of cards. One egg equals 1 oz. ? 2 servings of low-fat dairy each day. ? A serving of nuts, seeds, or beans 5 times each week. ? Heart-healthy fats. Healthy fats  called Omega-3 fatty acids are found in foods such as flaxseeds and coldwater fish, like sardines, salmon, and mackerel.  Limit how much you eat of the following: ? Canned or prepackaged foods. ? Food that is high in trans fat, such as fried foods. ? Food that is high in saturated fat, such as fatty meat. ? Sweets, desserts, sugary drinks, and other foods with added sugar. ? Full-fat dairy products.  Do not salt foods before eating.  Try to eat at least 2 vegetarian meals each week.  Eat more home-cooked food and less restaurant, buffet, and fast food.  When eating at a restaurant, ask that your food be prepared with less salt or no salt, if possible. What foods are recommended? The items listed may not be a complete list. Talk with your dietitian about what dietary choices are best for you. Grains Whole-grain or whole-wheat bread. Whole-grain or whole-wheat pasta. Brown rice. [place]. Bulgur. Whole-grain and low-sodium cereals. Pita bread. Low-fat, low-sodium crackers. Whole-wheat  flour tortillas. Vegetables Fresh or frozen vegetables (raw, steamed, roasted, or grilled). Low-sodium or reduced-sodium tomato and vegetable juice. Low-sodium or reduced-sodium tomato sauce and tomato paste. Low-sodium or reduced-sodium canned vegetables. Fruits All fresh, dried, or frozen fruit. Canned fruit in natural juice (without added sugar). Meat and other protein foods Skinless chicken or [place]. Ground chicken or [place]. Pork with fat trimmed off. Fish and seafood. Egg whites. Dried beans, peas, or lentils. Unsalted nuts, nut butters, and seeds. Unsalted canned beans. Lean cuts of beef with fat trimmed off. Low-sodium, lean deli meat. Dairy Low-fat (1%) or fat-free (skim) milk. Fat-free, low-fat, or reduced-fat cheeses. Nonfat, low-sodium ricotta or cottage cheese. Low-fat or nonfat yogurt. Low-fat, low-sodium cheese. Fats and oils Soft margarine without trans fats. Vegetable oil. Low-fat, reduced-fat, or light mayonnaise and salad dressings (reduced-sodium). Canola, safflower, olive, soybean, and sunflower oils. Avocado. Seasoning and other foods Herbs. Spices. Seasoning mixes without salt. Unsalted popcorn and pretzels. Fat-free sweets. What foods are not recommended? The items listed may not be a complete list. Talk with your dietitian about what dietary choices are best for you. Grains Baked goods made with fat, such as croissants, muffins, or some breads. Dry pasta or rice meal packs. Vegetables Creamed or fried vegetables. Vegetables in a cheese sauce. Regular canned vegetables (not low-sodium or reduced-sodium). Regular canned tomato sauce and paste (not low-sodium or reduced-sodium). Regular tomato and vegetable juice (not low-sodium or reduced-sodium). Angie Fava. Olives. Fruits Canned fruit in a light or heavy syrup. Fried fruit. Fruit in cream or butter sauce. Meat and other protein foods Fatty cuts of meat. Ribs. Fried meat. Berniece Salines. Sausage. Bologna and other processed lunch  meats. Salami. Fatback. Hotdogs. Bratwurst. Salted nuts and seeds. Canned beans with added salt. Canned or smoked fish. Whole eggs or egg yolks. Chicken or [place] with skin. Dairy Whole or 2% milk, cream, and half-and-half. Whole or full-fat cream cheese. Whole-fat or sweetened yogurt. Full-fat cheese. Nondairy creamers. Whipped toppings. Processed cheese and cheese spreads. Fats and oils Butter. Stick margarine. Lard. Shortening. Ghee. Bacon fat. Tropical oils, such as coconut, palm kernel, or palm oil. Seasoning and other foods Salted popcorn and pretzels. Onion salt, garlic salt, seasoned salt, table salt, and sea salt. Worcestershire sauce. Tartar sauce. Barbecue sauce. Teriyaki sauce. Soy sauce, including reduced-sodium. Steak sauce. Canned and packaged gravies. Fish sauce. Oyster sauce. Cocktail sauce. Horseradish that you find on the shelf. Ketchup. Mustard. Meat flavorings and tenderizers. Bouillon cubes. Hot sauce and Tabasco sauce. Premade or packaged marinades.  Premade or packaged taco seasonings. Relishes. Regular salad dressings. Where to find more information:  National Heart, Lung, and [place]: https://wilson-eaton.com/  American Heart Association: www.heart.org Summary  The DASH eating plan is a healthy eating plan that has been shown to reduce high blood pressure (hypertension). It may also reduce your risk for type 2 diabetes, heart disease, and stroke.  With the DASH eating plan, you should limit salt (sodium) intake to 2,300 mg a day. If you have hypertension, you may need to reduce your sodium intake to 1,500 mg a day.  When on the DASH eating plan, aim to eat more fresh fruits and vegetables, whole grains, lean proteins, low-fat dairy, and heart-healthy fats.  Work with your health care provider or diet and nutrition specialist (dietitian) to adjust your eating plan to your individual calorie needs. This information is not intended to replace advice given to you by your  health care provider. Make sure you discuss any questions you have with your health care provider. Document Revised: 11/19/2017 Document Reviewed: 11/30/2016 Elsevier Patient Education  2020 [employer].

## 2020-04-22 ENCOUNTER — Other Ambulatory Visit: Payer: Self-pay

## 2020-04-22 ENCOUNTER — Encounter: Payer: Self-pay | Admitting: Nurse Practitioner

## 2020-04-22 ENCOUNTER — Ambulatory Visit (INDEPENDENT_AMBULATORY_CARE_PROVIDER_SITE_OTHER): Payer: Medicare HMO | Admitting: Nurse Practitioner

## 2020-04-22 DIAGNOSIS — M1711 Unilateral primary osteoarthritis, right knee: Secondary | ICD-10-CM

## 2020-04-22 NOTE — Patient Instructions (Addendum)
Icy/Hot Lidocaine patches  DASH Eating Plan DASH stands for "Dietary Approaches to Stop Hypertension." The DASH eating plan is a healthy eating plan that has been shown to reduce high blood pressure (hypertension). It may also reduce your risk for type 2 diabetes, heart disease, and stroke. The DASH eating plan may also help with weight loss. What are tips for following this plan?  General guidelines  Avoid eating more than 2,300 mg (milligrams) of salt (sodium) a day. If you have hypertension, you may need to reduce your sodium intake to 1,500 mg a day.  Limit alcohol intake to no more than 1 drink a day for nonpregnant women and 2 drinks a day for men. One drink equals 12 oz of beer, 5 oz of wine, or 1 oz of hard liquor.  Work with your health care provider to maintain a healthy body weight or to lose weight. Ask what an ideal weight is for you.  Get at least 30 minutes of exercise that causes your heart to beat faster (aerobic exercise) most days of the week. Activities may include walking, swimming, or biking.  Work with your health care provider or diet and nutrition specialist (dietitian) to adjust your eating plan to your individual calorie needs. Reading food labels   Check food labels for the amount of sodium per serving. Choose foods with less than 5 percent of the Daily Value of sodium. Generally, foods with less than 300 mg of sodium per serving fit into this eating plan.  To find whole grains, look for the word "whole" as the first word in the ingredient list. Shopping  Buy products labeled as "low-sodium" or "no salt added."  Buy fresh foods. Avoid canned foods and premade or frozen meals. Cooking  Avoid adding salt when cooking. Use salt-free seasonings or herbs instead of table salt or sea salt. Check with your health care provider or pharmacist before using salt substitutes.  Do not fry foods. Cook foods using healthy methods such as baking, boiling, grilling, and  broiling instead.  Cook with heart-healthy oils, such as olive, canola, soybean, or sunflower oil. Meal planning  Eat a balanced diet that includes: ? 5 or more servings of fruits and vegetables each day. At each meal, try to fill half of your plate with fruits and vegetables. ? Up to 6-8 servings of whole grains each day. ? Less than 6 oz of lean meat, poultry, or fish each day. A 3-oz serving of meat is about the same size as a deck of cards. One egg equals 1 oz. ? 2 servings of low-fat dairy each day. ? A serving of nuts, seeds, or beans 5 times each week. ? Heart-healthy fats. Healthy fats called Omega-3 fatty acids are found in foods such as flaxseeds and coldwater fish, like sardines, salmon, and mackerel.  Limit how much you eat of the following: ? Canned or prepackaged foods. ? Food that is high in trans fat, such as fried foods. ? Food that is high in saturated fat, such as fatty meat. ? Sweets, desserts, sugary drinks, and other foods with added sugar. ? Full-fat dairy products.  Do not salt foods before eating.  Try to eat at least 2 vegetarian meals each week.  Eat more home-cooked food and less restaurant, buffet, and fast food.  When eating at a restaurant, ask that your food be prepared with less salt or no salt, if possible. What foods are recommended? The items listed may not be a complete list. Talk  with your dietitian about what dietary choices are best for you. Grains Whole-grain or whole-wheat bread. Whole-grain or whole-wheat pasta. Brown rice. [place]. Bulgur. Whole-grain and low-sodium cereals. Pita bread. Low-fat, low-sodium crackers. Whole-wheat flour tortillas. Vegetables Fresh or frozen vegetables (raw, steamed, roasted, or grilled). Low-sodium or reduced-sodium tomato and vegetable juice. Low-sodium or reduced-sodium tomato sauce and tomato paste. Low-sodium or reduced-sodium canned vegetables. Fruits All fresh, dried, or frozen fruit. Canned  fruit in natural juice (without added sugar). Meat and other protein foods Skinless chicken or [place]. Ground chicken or [place]. Pork with fat trimmed off. Fish and seafood. Egg whites. Dried beans, peas, or lentils. Unsalted nuts, nut butters, and seeds. Unsalted canned beans. Lean cuts of beef with fat trimmed off. Low-sodium, lean deli meat. Dairy Low-fat (1%) or fat-free (skim) milk. Fat-free, low-fat, or reduced-fat cheeses. Nonfat, low-sodium ricotta or cottage cheese. Low-fat or nonfat yogurt. Low-fat, low-sodium cheese. Fats and oils Soft margarine without trans fats. Vegetable oil. Low-fat, reduced-fat, or light mayonnaise and salad dressings (reduced-sodium). Canola, safflower, olive, soybean, and sunflower oils. Avocado. Seasoning and other foods Herbs. Spices. Seasoning mixes without salt. Unsalted popcorn and pretzels. Fat-free sweets. What foods are not recommended? The items listed may not be a complete list. Talk with your dietitian about what dietary choices are best for you. Grains Baked goods made with fat, such as croissants, muffins, or some breads. Dry pasta or rice meal packs. Vegetables Creamed or fried vegetables. Vegetables in a cheese sauce. Regular canned vegetables (not low-sodium or reduced-sodium). Regular canned tomato sauce and paste (not low-sodium or reduced-sodium). Regular tomato and vegetable juice (not low-sodium or reduced-sodium). Angie Fava. Olives. Fruits Canned fruit in a light or heavy syrup. Fried fruit. Fruit in cream or butter sauce. Meat and other protein foods Fatty cuts of meat. Ribs. Fried meat. Berniece Salines. Sausage. Bologna and other processed lunch meats. Salami. Fatback. Hotdogs. Bratwurst. Salted nuts and seeds. Canned beans with added salt. Canned or smoked fish. Whole eggs or egg yolks. Chicken or [place] with skin. Dairy Whole or 2% milk, cream, and half-and-half. Whole or full-fat cream cheese. Whole-fat or sweetened yogurt. Full-fat cheese.  Nondairy creamers. Whipped toppings. Processed cheese and cheese spreads. Fats and oils Butter. Stick margarine. Lard. Shortening. Ghee. Bacon fat. Tropical oils, such as coconut, palm kernel, or palm oil. Seasoning and other foods Salted popcorn and pretzels. Onion salt, garlic salt, seasoned salt, table salt, and sea salt. Worcestershire sauce. Tartar sauce. Barbecue sauce. Teriyaki sauce. Soy sauce, including reduced-sodium. Steak sauce. Canned and packaged gravies. Fish sauce. Oyster sauce. Cocktail sauce. Horseradish that you find on the shelf. Ketchup. Mustard. Meat flavorings and tenderizers. Bouillon cubes. Hot sauce and Tabasco sauce. Premade or packaged marinades. Premade or packaged taco seasonings. Relishes. Regular salad dressings. Where to find more information:  National Heart, Lung, and [place]: https://wilson-eaton.com/  American Heart Association: www.heart.org Summary  The DASH eating plan is a healthy eating plan that has been shown to reduce high blood pressure (hypertension). It may also reduce your risk for type 2 diabetes, heart disease, and stroke.  With the DASH eating plan, you should limit salt (sodium) intake to 2,300 mg a day. If you have hypertension, you may need to reduce your sodium intake to 1,500 mg a day.  When on the DASH eating plan, aim to eat more fresh fruits and vegetables, whole grains, lean proteins, low-fat dairy, and heart-healthy fats.  Work with your health care provider or diet and nutrition specialist (dietitian) to adjust your  eating plan to your individual calorie needs. This information is not intended to replace advice given to you by your health care provider. Make sure you discuss any questions you have with your health care provider. Document Revised: 11/19/2017 Document Reviewed: 11/30/2016 Elsevier Patient Education  2020 [employer].

## 2020-04-22 NOTE — Progress Notes (Signed)
BP 122/82   Pulse 73   Temp 98.4 F (36.9 C) (Oral)   SpO2 98%    Subjective:    Patient ID: Kelly Poole, female    DOB: 1951-06-10, 69 y.o.   MRN: [id number]  HPI: Kelly Poole is a 69 y.o. female  Chief Complaint  Patient presents with  . Knee Pain    pt states her knee is still hurting after the cortisone shot last week     KNEE PAIN Has OA in right knee, had knee injection on 04/17/2020 in office.  Ortho gave her Meloxicam, but she reports no relief with this and she has stopped taking.  She is followed by ortho.  Reports ongoing discomfort, even after knee injection.  States this morning it was fine and that when she started walking on it the medial aspect of her knee started hurting.  States it is not painful like it was before, but instead is just sore to one dime size spot on medial lower knee at times.  Has some mild swelling on and off to knee at baseline.  Reports pain is much improved after injection and can sleep at night now + went to gym today.  Did apply ice as she was recommended to do after procedure, but not consistently as was worried about ice burn.    No recent trauma to area.   Duration: chronic Involved knee: right Mechanism of injury: unknown Location:medial Onset: gradual Severity: none at night now Quality:  dull, aching like a bruise Frequency: intermittent Radiation: no Aggravating factors: movement and prolonged sitting  Alleviating factors: ice, APAP and rest, steroid injection Status: fluctuating Treatments attempted: ice, APAP and ibuprofen  Relief with NSAIDs?:  no Weakness with weight bearing or walking: no Sensation of giving way: no Locking: no Popping: no Bruising: no Swelling: occasional Redness: no Paresthesias/decreased sensation: no Fevers: no  Relevant past medical, surgical, family and social history reviewed and updated as indicated. Interim medical history since our last visit reviewed. Allergies and medications  reviewed and updated.  Review of Systems  Constitutional: Negative for activity change, appetite change, diaphoresis, fatigue and fever.  Respiratory: Negative for cough, chest tightness and shortness of breath.   Cardiovascular: Negative for chest pain, palpitations and leg swelling.  Gastrointestinal: Negative.   Musculoskeletal: Positive for arthralgias.  Neurological: Negative.   Psychiatric/Behavioral: Negative.     Per HPI unless specifically indicated above     Objective:    BP 122/82   Pulse 73   Temp 98.4 F (36.9 C) (Oral)   SpO2 98%   Wt Readings from Last 3 Encounters:  04/17/20 147 lb 3.2 oz (66.8 kg)  03/15/20 148 lb 12.8 oz (67.5 kg)  03/14/20 149 lb (67.6 kg)    Physical Exam Vitals and nursing note reviewed.  Constitutional:      General: She is awake. She is not in acute distress.    Appearance: She is well-developed, well-groomed and overweight. She is not ill-appearing.  HENT:     Head: Normocephalic.     Right Ear: Hearing normal.     Left Ear: Hearing normal.  Eyes:     General: Lids are normal.        Right eye: No discharge.        Left eye: No discharge.     Conjunctiva/sclera: Conjunctivae normal.     Pupils: Pupils are equal, round, and reactive to light.  Neck:     Thyroid: No thyromegaly.  Vascular: No carotid bruit.  Cardiovascular:     Rate and Rhythm: Normal rate and regular rhythm.     Heart sounds: Normal heart sounds. No murmur. No gallop.   Pulmonary:     Effort: Pulmonary effort is normal. No accessory muscle usage or respiratory distress.     Breath sounds: Normal breath sounds.  Abdominal:     General: Bowel sounds are normal.     Palpations: Abdomen is soft.  Musculoskeletal:     Cervical back: Normal range of motion and neck supple.     Right knee: Crepitus present. No swelling, erythema, ecchymosis, lacerations or bony tenderness. Normal range of motion. No tenderness.     Left knee: Normal.     Right lower leg: No  edema.     Left lower leg: No edema.     Comments: Right knee with no erythema, warmth today.  Very mild edema to medial lower aspect right knee, opposite side to area of injection.    Skin:    General: Skin is warm and dry.  Neurological:     Mental Status: She is alert and oriented to person, place, and time.  Psychiatric:        Attention and Perception: Attention normal.        Mood and Affect: Mood normal.        Speech: Speech normal.        Behavior: Behavior normal. Behavior is cooperative.        Thought Content: Thought content normal.     Results for orders placed or performed in visit on [id number]  Basic metabolic panel  Result Value Ref Range   Glucose 95 65 - 99 mg/dL   BUN 26 8 - 27 mg/dL   Creatinine, Ser 1.50 (H) 0.57 - 1.00 mg/dL   GFR calc non Af Amer 36 (L) >59 mL/min/1.73   GFR calc Af Amer 42 (L) >59 mL/min/1.73   BUN/Creatinine Ratio 17 12 - 28   Sodium 138 134 - 144 mmol/L   Potassium 4.6 3.5 - 5.2 mmol/L   Chloride 104 96 - 106 mmol/L   CO2 21 20 - 29 mmol/L   Calcium 9.7 8.7 - 10.3 mg/dL      Assessment & Plan:   Problem List Items Addressed This Visit      Musculoskeletal and Integument   Osteoarthritis of right knee    Ongoing with some improvement post-steroid injection.  Able to sleep through night now and walk on leg without issue.  Reports mild, bruise like tenderness now.  Did not consistently apply ice as recommended.  Have recommended she apply ice for 20 minutes at least every 4 hours while at home.  May use Icy/Hot lidocaine patches and Tylenol as needed for comfort + knee support sleeve.  To return to office if ongoing issues and then may place referral to physical therapy if ongoing.          Follow up plan: Return if symptoms worsen or fail to improve.

## 2020-04-22 NOTE — Assessment & Plan Note (Signed)
Ongoing with some improvement post-steroid injection.  Able to sleep through night now and walk on leg without issue.  Reports mild, bruise like tenderness now.  Did not consistently apply ice as recommended.  Have recommended she apply ice for 20 minutes at least every 4 hours while at home.  May use Icy/Hot lidocaine patches and Tylenol as needed for comfort + knee support sleeve.  To return to office if ongoing issues and then may place referral to physical therapy if ongoing.

## 2020-05-07 ENCOUNTER — Other Ambulatory Visit: Payer: Self-pay | Admitting: Nurse Practitioner

## 2020-05-16 ENCOUNTER — Other Ambulatory Visit: Payer: Medicare HMO

## 2020-05-16 ENCOUNTER — Other Ambulatory Visit: Payer: Self-pay

## 2020-05-16 DIAGNOSIS — N1831 Chronic kidney disease, stage 3a: Secondary | ICD-10-CM

## 2020-05-17 ENCOUNTER — Ambulatory Visit: Payer: Medicare HMO | Admitting: Pharmacist

## 2020-05-17 DIAGNOSIS — N1832 Chronic kidney disease, stage 3b: Secondary | ICD-10-CM

## 2020-05-17 DIAGNOSIS — I10 Essential (primary) hypertension: Secondary | ICD-10-CM

## 2020-05-17 LAB — BASIC METABOLIC PANEL
BUN/Creatinine Ratio: 14 (ref 12–28)
BUN: 21 mg/dL (ref 8–27)
CO2: 22 mmol/L (ref 20–29)
Calcium: 9.6 mg/dL (ref 8.7–10.3)
Chloride: 105 mmol/L (ref 96–106)
Creatinine, Ser: 1.48 mg/dL — ABNORMAL HIGH (ref 0.57–1.00)
GFR calc Af Amer: 42 mL/min/{1.73_m2} — ABNORMAL LOW (ref >59)
GFR calc non Af Amer: 37 mL/min/{1.73_m2} — ABNORMAL LOW (ref >59)
Glucose: 109 mg/dL — ABNORMAL HIGH (ref 65–99)
Potassium: 4.2 mmol/L (ref 3.5–5.2)
Sodium: 141 mmol/L (ref 134–144)

## 2020-05-17 NOTE — Progress Notes (Signed)
Contacted via MyChart  Good evening Kelly Poole.  Your kidney function is remaining stable with kidney disease stage 3.  No major decline, this is good.  We will continue to monitor this and continue Losartan which offers kidney protection.  Sugar levels is mildly elevated at 109, but nothing concerning at this time.  Electrolytes are looking good.  Overall you are doing great!!  Have a wonderful weekend!!

## 2020-05-17 NOTE — Patient Instructions (Addendum)
Kelly Poole,   It was great talking with you today!  You can bring your wrist blood pressure machine with you to the August appointment with [facility] for comparison.   Give me or Kelly Poole a call with any questions or concerns in the meantime. Kelly Poole, I'll call you in August.   Take Care!  Kelly Poole, PharmD, [place] [phone]  Noreene Larsson RN, MSN, [facility] Mobile: [phone]  Visit Information  Goals Addressed            This Visit's Progress     Patient Stated   . PharmD "I want to take care of my kidneys" (pt-stated)       CARE PLAN ENTRY (see longtitudinal plan of care for additional care plan information)  Current Barriers:  . Polypharmacy; complex patient with multiple comorbidities including HTN, OA, HLD, insomnia, hx breast cancer . Has questions regarding recent lab results, what they mean, and what her medications ar efor . Most recent eGFR: ~42 mL/min o HTN: losartan 100 mg daily, HCTZ 12.5 mg daily, home BP 120/70s, though notes she has a wrist BP cuff. BP reading in office ~3 weeks ago was consistent o ASCVD risk reduction: atorvastatin 10 mg daily; last LDL not quite at goal <100; ASA 81 mg daily o Insomnia: trazodone 100 mg QPM o OA: taking turmeric daily o GERD: omeprazole 20 mg daily  o Supplements: MVI, vitamin C, vitamin B12, vitamin E, ferrous sulfate daily; garlic occasionally o Allergies: cetirizine PRN o Exercise: going to the gym 3 days per week, does treadmill and ab work; hasn't been going often lately because of work schedule  Pharmacist Clinical Goal(s):  Marland Kitchen Over the next 90 days, patient will work with PharmD and provider towards optimized medication management  Interventions: . Comprehensive medication review performed; medication list updated in  electronic medical record . Inter-disciplinary care team collaboration (see longitudinal plan of care) . Reviewed recent lab results in depth and compared to last BMP. Reviewed that since patient was NOT fasting, we are not concerned w/ glucose level. . Reviewed goal BP <130/80 if tolerated to reduce risk of renal decline. Encouraged her to continue regimen w/ losartan 100 mg daily and HCTZ 12.5 mg daily. Encouraged to bring home BP cuf to the office at next visit to compare for accuracy . Encouraged adherence to atorvastatin. Could consider dose increase if LDL remains >100. Fill hx up to date . Reviewed Natural Meds. No issues w/ turmeric and renal function. Patient reports significant benefit with this, since meloxicam was stopped d/t CKD   Patient Self Care Activities:  . Patient will take medications as prescribed  Initial goal documentation        Patient verbalizes understanding of instructions provided today.   Plan: - Scheduled f/u call in ~ 12 weeks  Kelly Poole, PharmD, [place] [phone]

## 2020-05-17 NOTE — Chronic Care Management (AMB) (Signed)
**Note Kelly-Identified via Obfuscation** Chronic Care Management   Note  05/17/2020 Name: Kelly Poole MRN: [id number] DOB: [birth year]   Subjective:  Kelly Poole is Poole 69 y.o. year old female who is Poole primary care patient of Kelly Poole. The CCM team was consulted for assistance with chronic disease management and care coordination needs.    Contacted patient for medication management review.   Review of patient status, including review of consultants reports, laboratory and other test data, was performed as part of comprehensive evaluation and provision of chronic care management services.   SDOH (Social Determinants of Health) assessments and interventions performed:  no  Objective:  Lab Results  Component Value Date   CREATININE 1.48 (H) 05/16/2020   CREATININE 1.50 (H) 04/17/2020   CREATININE 1.21 (H) 02/16/2020        Component Value Date/Time   CHOL 180 02/16/2020 1624   CHOL 178 06/03/2015 1127   TRIG 72 02/16/2020 1624   TRIG 93 06/03/2015 1127   HDL 59 02/16/2020 1624   CHOLHDL 2.8 08/23/2018 1635   VLDL 19 06/03/2015 1127   LDLCALC 108 (H) 02/16/2020 1624    Clinical ASCVD: No  The 10-year ASCVD risk score Kelly Bussing DC Jr., et al., 2013) is: 7.8%   Values used to calculate the score:     Age: 49 years     Sex: Female     Is Non-Hispanic African American: Yes     Diabetic: No     Tobacco smoker: No     Systolic Blood Pressure: 941 mmHg     Is BP treated: Yes     HDL Cholesterol: 59 mg/dL     Total Cholesterol: 180 mg/dL    BP Readings from Last 3 Encounters:  04/22/20 122/82  04/17/20 132/80  03/15/20 140/88    Allergies  Allergen Reactions  . Macrobid [Nitrofurantoin Macrocrystal] Other (See Comments)    Sore throat and irritation to throat    Medications Reviewed Today    Reviewed by Kelly Hollingshead, [facility] (Pharmacist) on 05/17/20 at 1135  Med List Status: <None>  Medication Order Taking? Sig Documenting Provider Last Dose Status Informant  Ascorbic Acid (VITAMIN  C) 1000 MG tablet [id number] Yes Take 1,000 mg by mouth daily. [provider] Taking Active   aspirin 81 MG tablet [id number] Yes Take 81 mg by mouth daily. [provider] Taking Active   atorvastatin (LIPITOR) 10 MG tablet [id number] Yes Take 1 tablet by mouth once daily Kelly Poole Taking Active   Cetirizine HCl (ALLERGY RELIEF) 10 MG CAPS [id number] Yes Take by mouth. [provider] Taking Active            Med Note Kelly Poole   Fri May 17, 2020 11:34 AM)    ferrous sulfate 325 (65 FE) MG tablet [id number] Yes Take 325 mg by mouth daily with breakfast. [provider] Taking Active   Garlic 7096 MG CAPS [id number] Yes Take 1,000 mg by mouth daily. [provider] Taking Active            Med Note (Kelly Poole   Thu Oct 12, 2019  3:09 PM) Doesn't take regular  hydrochlorothiazide (HYDRODIURIL) 25 MG tablet [id number] Yes Take 1 tablet (25 mg total) by mouth daily. Kelly Poole Taking Active            Med Note Kelly Hollingshead   Fri May 17, 2020 11:32 AM) 1/2 tablet  ketoconazole (NIZORAL) 2 %  cream [id number]  Apply 1 application topically daily.  Patient not taking: Reported on 04/17/2020   Kelly Poole  Active   losartan (COZAAR) 100 MG tablet [id number] Yes Take 1 tablet (100 mg total) by mouth daily. Kelly Poole Taking Active   Multiple Vitamin (MULTIVITAMINS PO) [id number] Yes Take by mouth daily. [provider] Taking Active Self  omeprazole (PRILOSEC) 20 MG capsule [id number] Yes Take 1 capsule by mouth once daily Kelly Poole Taking Active   traZODone (DESYREL) 100 MG tablet [id number] Yes Take 1 tablet (100 mg total) by mouth at bedtime. Kelly Poole Taking Active   TURMERIC PO [id number] Yes Take 1 capsule by mouth daily. [provider] Taking Active   vitamin B-12 (CYANOCOBALAMIN) 1000 MCG tablet [id number] Yes Take 1,000 mcg by mouth daily. [provider] Taking Active   vitamin E 100 UNIT capsule [id number] Yes Take 100 Units by mouth daily. [provider] Taking Active            Assessment:   Goals Addressed            This Visit's Progress     Patient Stated   . PharmD "I want to take care of my kidneys" (pt-stated)       CARE PLAN ENTRY (see longtitudinal plan of care for additional care plan information)  Current Barriers:  . Polypharmacy; complex patient with multiple comorbidities including HTN, OA, HLD, insomnia, hx breast cancer . Has questions regarding recent lab results, what they mean, and what her medications ar efor . Most recent eGFR: ~42 mL/min o HTN: losartan 100 mg daily, HCTZ 12.5 mg daily, home BP 120/70s, though notes she has Poole wrist BP cuff. BP reading in office ~3 weeks ago was consistent o ASCVD risk reduction: atorvastatin 10 mg daily; last LDL not quite at goal <100; ASA 81 mg daily o Insomnia: trazodone 100 mg QPM o OA: taking turmeric daily o GERD: omeprazole 20 mg daily  o Supplements: MVI, vitamin C, vitamin B12, vitamin E, ferrous sulfate daily; garlic occasionally o Allergies: cetirizine PRN o Exercise: going to the gym 3 days per week, does treadmill and ab work; hasn't been going often lately because of work schedule  Pharmacist Clinical Goal(s):  Marland Kitchen Over the next 90 days, patient will work with PharmD and provider towards optimized medication management  Interventions: . Comprehensive medication review performed; medication list updated in electronic medical record . Inter-disciplinary care team collaboration (see longitudinal plan of care) . Reviewed recent lab results in depth and compared to last BMP. Reviewed that since patient was NOT fasting, we are not concerned w/ glucose level. . Reviewed goal BP <130/80 if tolerated to reduce risk of renal decline. Encouraged her to continue regimen w/ losartan 100 mg daily and HCTZ 12.5 mg daily. Encouraged to bring home  BP cuf to the office at next visit to compare for accuracy . Encouraged adherence to atorvastatin. Could consider dose increase if LDL remains >100. Fill hx up to date . Reviewed Natural Meds. No issues w/ turmeric and renal function. Patient reports significant benefit with this, since meloxicam was stopped d/t CKD   Patient Self Care Activities:  . Patient will take medications as prescribed  Initial goal documentation        Plan: - Scheduled f/u call in ~ 12 weeks  Catie Kelly Maffucci, PharmD, [place] [phone]

## 2020-06-04 ENCOUNTER — Ambulatory Visit: Payer: Self-pay | Admitting: General Practice

## 2020-06-04 ENCOUNTER — Telehealth: Payer: Medicare HMO

## 2020-06-04 NOTE — Chronic Care Management (AMB) (Signed)
  Chronic Care Management   Outreach Note  06/04/2020 Name: TOLEEN LACHAPELLE MRN: [id number] DOB: September 26, 1951  Referred by: Venita Lick, NP Reason for referral : Chronic Care Management (Follow up call: RNCM: Chronic Disease management and Care coordination needs)   An unsuccessful telephone outreach was attempted today. The patient was referred to the case management team for assistance with care management and care coordination.   Follow Up Plan: A HIPPA compliant phone message was left for the patient providing contact information and requesting a return call.   Noreene Larsson RN, MSN, [facility] Mobile: [phone]

## 2020-06-20 ENCOUNTER — Telehealth: Payer: Self-pay

## 2020-06-20 NOTE — Telephone Encounter (Signed)
Incoming fax from [facility] wanting prescriptions for the following Trazodone Losartan  mupirocin 2% omeprazole

## 2020-06-21 ENCOUNTER — Other Ambulatory Visit: Payer: Self-pay | Admitting: Nurse Practitioner

## 2020-06-21 ENCOUNTER — Other Ambulatory Visit: Payer: Self-pay

## 2020-06-21 MED ORDER — TRAZODONE HCL 100 MG PO TABS: 100.0000 mg | ORAL_TABLET | Freq: Every day | ORAL | 4 refills | Status: DC

## 2020-06-21 MED ORDER — MUPIROCIN 2 % EX OINT: 1.0000 "application " | TOPICAL_OINTMENT | Freq: Two times a day (BID) | CUTANEOUS | 1 refills | Status: DC

## 2020-06-21 MED ORDER — OMEPRAZOLE 20 MG PO CPDR: 20.0000 mg | DELAYED_RELEASE_CAPSULE | Freq: Every day | ORAL | 4 refills | Status: DC

## 2020-06-21 MED ORDER — HYDROCHLOROTHIAZIDE 25 MG PO TABS: 25.0000 mg | ORAL_TABLET | Freq: Every day | ORAL | 3 refills | Status: DC

## 2020-06-21 MED ORDER — ATORVASTATIN CALCIUM 10 MG PO TABS: 10.0000 mg | ORAL_TABLET | Freq: Every day | ORAL | 4 refills | Status: DC

## 2020-06-21 MED ORDER — LOSARTAN POTASSIUM 100 MG PO TABS: 100.0000 mg | ORAL_TABLET | Freq: Every day | ORAL | 4 refills | Status: DC

## 2020-06-21 NOTE — Telephone Encounter (Signed)
Also requesting lisinopril 10mg  tablet, I do not see that on her active medication list.

## 2020-06-21 NOTE — Telephone Encounter (Signed)
She should not be taking Lisinopril as is on Losartan.:)

## 2020-06-21 NOTE — Telephone Encounter (Signed)
Sent this to [facility]

## 2020-06-25 ENCOUNTER — Other Ambulatory Visit: Payer: Self-pay

## 2020-06-25 MED ORDER — TRAZODONE HCL 100 MG PO TABS: 100.0000 mg | ORAL_TABLET | Freq: Every day | ORAL | 4 refills | Status: DC

## 2020-06-25 MED ORDER — LOSARTAN POTASSIUM 100 MG PO TABS: 100.0000 mg | ORAL_TABLET | Freq: Every day | ORAL | 4 refills | Status: DC

## 2020-06-25 MED ORDER — ATORVASTATIN CALCIUM 10 MG PO TABS: 10.0000 mg | ORAL_TABLET | Freq: Every day | ORAL | 4 refills | Status: DC

## 2020-06-25 MED ORDER — HYDROCHLOROTHIAZIDE 25 MG PO TABS: 25.0000 mg | ORAL_TABLET | Freq: Every day | ORAL | 3 refills | Status: DC

## 2020-06-25 NOTE — Telephone Encounter (Signed)
Fax from [facility]. Requesting new Rx for atorvastatin (LIPITOR) 10 MG tablet, hydrochlorothiazide (HYDRODIURIL) 25 MG tablet, losartan (COZAAR) 100 MG tablet, traZODone (DESYREL) 100 MG tablet.   Also requesting Lisinopril 10 MG tablet.

## 2020-06-25 NOTE — Telephone Encounter (Signed)
No Lisinopril sent as patient taking Losartan and should not be taking this and Lisinopril -- please alert [employer] and patient Lisinopril will not be filled because of this.

## 2020-07-05 ENCOUNTER — Telehealth: Payer: Medicare HMO

## 2020-07-05 ENCOUNTER — Ambulatory Visit: Payer: Self-pay | Admitting: General Practice

## 2020-07-05 NOTE — Chronic Care Management (AMB) (Signed)
°  Chronic Care Management   Outreach Note  07/05/2020 Name: Kelly Poole MRN: [id number] DOB: August 18, 1951  Referred by: Venita Lick, NP Reason for referral : Chronic Care Management (RNCM Follow up: 2nd attempt for Chronic Disease Management and Care Coordination Needs)   A second unsuccessful telephone outreach was attempted today. The patient was referred to the case management team for assistance with care management and care coordination.   Follow Up Plan: A HIPPA compliant phone message was left for the patient providing contact information and requesting a return call.   Noreene Larsson RN, MSN, [facility] Mobile: [phone]

## 2020-07-30 ENCOUNTER — Ambulatory Visit (INDEPENDENT_AMBULATORY_CARE_PROVIDER_SITE_OTHER): Payer: Medicare HMO | Admitting: Nurse Practitioner

## 2020-07-30 ENCOUNTER — Encounter: Payer: Self-pay | Admitting: Nurse Practitioner

## 2020-07-30 ENCOUNTER — Other Ambulatory Visit: Payer: Self-pay

## 2020-07-30 VITALS — BP 114/75 | HR 85 | Temp 98.6°F | Wt 142.2 lb

## 2020-07-30 DIAGNOSIS — I10 Essential (primary) hypertension: Secondary | ICD-10-CM | POA: Diagnosis not present

## 2020-07-30 DIAGNOSIS — E7800 Pure hypercholesterolemia, unspecified: Secondary | ICD-10-CM | POA: Diagnosis not present

## 2020-07-30 DIAGNOSIS — K219 Gastro-esophageal reflux disease without esophagitis: Secondary | ICD-10-CM

## 2020-07-30 DIAGNOSIS — D509 Iron deficiency anemia, unspecified: Secondary | ICD-10-CM | POA: Diagnosis not present

## 2020-07-30 DIAGNOSIS — D649 Anemia, unspecified: Secondary | ICD-10-CM | POA: Insufficient documentation

## 2020-07-30 DIAGNOSIS — N1832 Chronic kidney disease, stage 3b: Secondary | ICD-10-CM

## 2020-07-30 DIAGNOSIS — D518 Other vitamin B12 deficiency anemias: Secondary | ICD-10-CM | POA: Diagnosis not present

## 2020-07-30 DIAGNOSIS — Z853 Personal history of malignant neoplasm of breast: Secondary | ICD-10-CM | POA: Diagnosis not present

## 2020-07-30 MED ORDER — VITAMIN B-12 1000 MCG PO TABS: 1000.0000 ug | ORAL_TABLET | Freq: Every day | ORAL | 4 refills | Status: DC

## 2020-07-30 MED ORDER — FERROUS SULFATE 325 (65 FE) MG PO TABS: 325.0000 mg | ORAL_TABLET | Freq: Every day | ORAL | 4 refills | Status: DC

## 2020-07-30 NOTE — Assessment & Plan Note (Signed)
Ongoing, CKD 3.  Continue Losartan for kidney protection.  BMP today.  Refer to nephrology as needed for worsening kidney function.

## 2020-07-30 NOTE — Assessment & Plan Note (Signed)
Chronic, ongoing.  Continue current medication regimen and adjust as needed. Lipid panel today. 

## 2020-07-30 NOTE — Assessment & Plan Note (Signed)
Chronic, ongoing.  Continue daily iron supplement.  Recheck iron and CBC today.  Return to hematology as needed. 

## 2020-07-30 NOTE — Patient Instructions (Signed)
Chronic Kidney Disease, Adult Chronic kidney disease (CKD) happens when the kidneys are damaged over a long period of time. The kidneys are two organs that help with:  Getting rid of waste and extra fluid from the blood.  Making hormones that maintain the amount of fluid in your tissues and blood vessels.  Making sure that the body has the right amount of fluids and chemicals. Most of the time, CKD does not go away, but it can usually be controlled. Steps must be taken to slow down the kidney damage or to stop it from getting worse. If this is not done, the kidneys may stop working. Follow these instructions at home: Medicines  Take over-the-counter and prescription medicines only as told by your doctor. You may need to change the amount of medicines you take.  Do not take any new medicines unless your doctor says it is okay. Many medicines can make your kidney damage worse.  Do not take any vitamin and supplements unless your doctor says it is okay. Many vitamins and supplements can make your kidney damage worse. General instructions  Follow a diet as told by your doctor. You may need to stay away from: ? Alcohol. ? Salty foods. ? Foods that are high in:  Potassium.  Calcium.  Protein.  Do not use any products that contain nicotine or tobacco, such as cigarettes and e-cigarettes. If you need help quitting, ask your doctor.  Keep track of your blood pressure at home. Tell your doctor about any changes.  If you have diabetes, keep track of your blood sugar as told by your doctor.  Try to stay at a healthy weight. If you need help, ask your doctor.  Exercise at least 30 minutes a day, 5 days a week.  Stay up-to-date with your shots (immunizations) as told by your doctor.  Keep all follow-up visits as told by your doctor. This is important. Contact a doctor if:  Your symptoms get worse.  You have new symptoms. Get help right away if:  You have symptoms of end-stage  kidney disease. These may include: ? Headaches. ? Numbness in your hands or feet. ? Easy bruising. ? Having hiccups often. ? Chest pain. ? Shortness of breath. ? Stopping of menstrual periods in women.  You have a fever.  You have very little pee (urine).  You have pain or bleeding when you pee. Summary  Chronic kidney disease (CKD) happens when the kidneys are damaged over a long period of time.  Most of the time, this condition does not go away, but it can usually be controlled. Steps must be taken to slow down the kidney damage or to stop it from getting worse.  Treatment may include a combination of medicines and lifestyle changes. This information is not intended to replace advice given to you by your health care provider. Make sure you discuss any questions you have with your health care provider. Document Revised: 11/19/2017 Document Reviewed: 01/11/2017 Elsevier Patient Education  2020 Elsevier Inc.  

## 2020-07-30 NOTE — Assessment & Plan Note (Signed)
Chronic, stable.  Continue daily B12 supplement. Check B12 level today and adjust supplement as needed.

## 2020-07-30 NOTE — Progress Notes (Addendum)
BP 114/75 (BP Location: Left Arm, Patient Position: Sitting, Cuff Size: Normal)   Pulse 85   Temp 98.6 F (37 C) (Oral)   Wt 142 lb 3.2 oz (64.5 kg)   SpO2 97%   BMI 30.68 kg/m    Subjective:    Patient ID: Kelly Poole, female    DOB: September 02, 1951, 69 y.o.   MRN: [id number]  HPI: Kelly Poole is a 69 y.o. female  Chief Complaint  Patient presents with  . Hypertension  . Hyperlipidemia  . Anemia  . Gastroesophageal Reflux   HYPERTENSION / HYPERLIPIDEMIA Continues on Losartan 100 MG daily & HCTZ 25 MG, ASA, and Atorvastatin for HLD.  Satisfied with current treatment? yes Duration of hypertension: chronic BP monitoring frequency: daily BP range: 110/70 range at home BP medication side effects: no Duration of hyperlipidemia: chronic Cholesterol medication side effects: no Cholesterol supplements: none Medication compliance: good compliance Aspirin: yes Recent stressors: no Recurrent headaches: no Visual changes: no Palpitations: no Dyspnea: no Chest pain: no Lower extremity edema: no Dizzy/lightheaded: no   GERD Continues on Prilosec 20 MG daily. GERD control status: stable  Satisfied with current treatment? yes Heartburn frequency: none Medication side effects: no  Medication compliance: stable Previous GERD medications: OTC medications Antacid use frequency:  none Dysphagia: no Odynophagia:  no Hematemesis: no Blood in stool: no EGD: no  CHRONIC KIDNEY DISEASE January 2020 labs with CRT 1.48 and GFR 42. CKD status: stable Medications renally dose: no Previous renal evaluation: no Pneumovax:  refused Influenza Vaccine:  refused  ANEMIA Seen by hematology last 04/04/20 and told to restart iron supplement.  Taking ferrous sulfate and B12 daily.    Anemia status: stable Etiology of anemia: iron deficiency  Duration of anemia treatment: lifelong Compliance with treatment: good compliance Iron supplementation side effects: no Severity of  anemia: mild Fatigue: no Decreased exercise tolerance: no  Dyspnea on exertion: no Palpitations: no Bleeding: no Pica: no  Has history of breast cancer and requests form sent to [facility] to have her prothesis replaced  Relevant past medical, surgical, family and social history reviewed and updated as indicated. Interim medical history since our last visit reviewed. Allergies and medications reviewed and updated.  Review of Systems  Constitutional: Negative for activity change, appetite change, diaphoresis, fatigue and fever.  Respiratory: Negative for cough, chest tightness and shortness of breath.   Cardiovascular: Negative for chest pain, palpitations and leg swelling.  Gastrointestinal: Negative.   Neurological: Negative.   Psychiatric/Behavioral: Negative.     Per HPI unless specifically indicated above     Objective:    BP 114/75 (BP Location: Left Arm, Patient Position: Sitting, Cuff Size: Normal)   Pulse 85   Temp 98.6 F (37 C) (Oral)   Wt 142 lb 3.2 oz (64.5 kg)   SpO2 97%   BMI 30.68 kg/m   Wt Readings from Last 3 Encounters:  07/30/20 142 lb 3.2 oz (64.5 kg)  04/17/20 147 lb 3.2 oz (66.8 kg)  03/15/20 148 lb 12.8 oz (67.5 kg)    Physical Exam Vitals and nursing note reviewed.  Constitutional:      General: She is awake. She is not in acute distress.    Appearance: She is well-developed, well-groomed and overweight. She is not ill-appearing.  HENT:     Head: Normocephalic.     Right Ear: Hearing normal.     Left Ear: Hearing normal.  Eyes:     General: Lids are normal.  Right eye: No discharge.        Left eye: No discharge.     Conjunctiva/sclera: Conjunctivae normal.     Pupils: Pupils are equal, round, and reactive to light.  Neck:     Thyroid: No thyromegaly.     Vascular: No carotid bruit.  Cardiovascular:     Rate and Rhythm: Normal rate and regular rhythm.     Heart sounds: Normal heart sounds. No murmur heard.  No gallop.    Pulmonary:     Effort: Pulmonary effort is normal. No accessory muscle usage or respiratory distress.     Breath sounds: Normal breath sounds.  Abdominal:     General: Bowel sounds are normal.     Palpations: Abdomen is soft.  Musculoskeletal:     Cervical back: Normal range of motion and neck supple.     Right lower leg: No edema.     Left lower leg: No edema.  Lymphadenopathy:     Cervical: No cervical adenopathy.  Skin:    General: Skin is warm and dry.  Neurological:     Mental Status: She is alert and oriented to person, place, and time.  Psychiatric:        Attention and Perception: Attention normal.        Mood and Affect: Mood normal.        Speech: Speech normal.        Behavior: Behavior normal. Behavior is cooperative.        Thought Content: Thought content normal.     Results for orders placed or performed in visit on 07/30/20  Iron, TIBC and Ferritin Panel  Result Value Ref Range   Total Iron Binding Capacity 200 (L) 250 - 450 ug/dL   UIBC 147 118 - 369 ug/dL   Iron 53 27 - 139 ug/dL   Iron Saturation 27 15 - 55 %   Ferritin 351 (H) 15.0 - 150.0 ng/mL  Basic metabolic panel  Result Value Ref Range   Glucose 87 65 - 99 mg/dL   BUN 24 8 - 27 mg/dL   Creatinine, Ser 1.41 (H) 0.57 - 1.00 mg/dL   GFR calc non Af Amer 39 (L) >59 mL/min/1.73   GFR calc Af Amer 45 (L) >59 mL/min/1.73   BUN/Creatinine Ratio 17 12 - 28   Sodium 137 134 - 144 mmol/L   Potassium 5.0 3.5 - 5.2 mmol/L   Chloride 100 96 - 106 mmol/L   CO2 24 20 - 29 mmol/L   Calcium 9.5 8.7 - 10.3 mg/dL  CBC with Differential/Platelet  Result Value Ref Range   WBC 7.6 3.4 - 10.8 x10E3/uL   RBC 3.13 (L) 3.77 - 5.28 x10E6/uL   Hemoglobin 9.6 (L) 11.1 - 15.9 g/dL   Hematocrit 29.0 (L) 34.0 - 46.6 %   MCV 93 79 - 97 fL   MCH 30.7 26.6 - 33.0 pg   MCHC 33.1 31 - 35 g/dL   RDW 12.9 11.7 - 15.4 %   Platelets 294 150 - 450 x10E3/uL   Neutrophils 48 Not Estab. %   Lymphs 38 Not Estab. %   Monocytes  8 Not Estab. %   Eos 5 Not Estab. %   Basos 1 Not Estab. %   Neutrophils Absolute 3.7 1 - 7 x10E3/uL   Lymphocytes Absolute 2.9 0 - 3 x10E3/uL   Monocytes Absolute 0.6 0 - 0 x10E3/uL   EOS (ABSOLUTE) 0.4 0.0 - 0.4 x10E3/uL   Basophils Absolute 0.1 0 -  0 x10E3/uL   Immature Granulocytes 0 Not Estab. %   Immature Grans (Abs) 0.0 0.0 - 0.1 x10E3/uL  Lipid Panel w/o Chol/HDL Ratio  Result Value Ref Range   Cholesterol, Total 201 (H) 100 - 199 mg/dL   Triglycerides 207 (H) 0 - 149 mg/dL   HDL 51 >39 mg/dL   VLDL Cholesterol Cal 36 5 - 40 mg/dL   LDL Chol Calc (NIH) 114 (H) 0 - 99 mg/dL  Vitamin B12  Result Value Ref Range   Vitamin B-12 >2000 (H) 232 - 1245 pg/mL      Assessment & Plan:   Problem List Items Addressed This Visit      Cardiovascular and Mediastinum   Hypertension - Primary    Chronic, ongoing with BP below goal in office and at home. Will continue Losartan 100 MG and HCTZ 25 MG, has taken this in past and tolerates well.  Recommend she monitor BP at home at least as few mornings a week and document, report to provider if consistent elevation >130/80.  DASH diet focus.  BMP today.  Return in 6 months.      Relevant Orders   Basic metabolic panel (Completed)     Digestive   Gastroesophageal reflux disease (Chronic)    Chronic, stable on Omeprazole.  Reports return of symptoms without medication, when has trialed GDR in past.  Continue current dose.  Mag level in 6 months.        Genitourinary   CKD (chronic kidney disease) stage 3, GFR 30-59 ml/min (HCC)    Ongoing, CKD 3.  Continue Losartan for kidney protection.  BMP today.  Refer to nephrology as needed for worsening kidney function.      Relevant Orders   Basic metabolic panel (Completed)     Other   HX: breast cancer    Continue yearly mammograms and will work on getting new prosthesis from [facility] for patient.      Hyperlipidemia    Chronic, ongoing.  Continue current medication regimen and adjust as  needed.  Lipid panel today.      Relevant Orders   Lipid Panel w/o Chol/HDL Ratio (Completed)   Vitamin B12 deficiency (dietary) anemia    Chronic, stable.  Continue daily B12 supplement. Check B12 level today and adjust supplement as needed.      Relevant Medications   ferrous sulfate 325 (65 FE) MG tablet   vitamin B-12 (CYANOCOBALAMIN) 1000 MCG tablet   Other Relevant Orders   Vitamin B12 (Completed)   Iron deficiency anemia    Chronic, ongoing.  Continue daily iron supplement.  Recheck iron and CBC today.  Return to hematology as needed.      Relevant Medications   ferrous sulfate 325 (65 FE) MG tablet   vitamin B-12 (CYANOCOBALAMIN) 1000 MCG tablet   Other Relevant Orders   Iron, TIBC and Ferritin Panel (Completed)   CBC with Differential/Platelet (Completed)       Follow up plan: Return in about 6 months (around 01/30/2021) for Due February on or after 02/15/21.

## 2020-07-30 NOTE — Assessment & Plan Note (Signed)
Chronic, ongoing with BP below goal in office and at home. Will continue Losartan 100 MG and HCTZ 25 MG, has taken this in past and tolerates well.  Recommend she monitor BP at home at least as few mornings a week and document, report to provider if consistent elevation >130/80.  DASH diet focus.  BMP today.  Return in 6 months.

## 2020-07-30 NOTE — Assessment & Plan Note (Signed)
Chronic, stable on Omeprazole.  Reports return of symptoms without medication, when has trialed GDR in past.  Continue current dose.  Mag level in 6 months.

## 2020-07-31 ENCOUNTER — Other Ambulatory Visit: Payer: Self-pay | Admitting: Nurse Practitioner

## 2020-07-31 ENCOUNTER — Telehealth: Payer: Self-pay | Admitting: Oncology

## 2020-07-31 ENCOUNTER — Telehealth: Payer: Self-pay | Admitting: *Deleted

## 2020-07-31 DIAGNOSIS — Z1231 Encounter for screening mammogram for malignant neoplasm of breast: Secondary | ICD-10-CM

## 2020-07-31 DIAGNOSIS — N1832 Chronic kidney disease, stage 3b: Secondary | ICD-10-CM

## 2020-07-31 LAB — CBC WITH DIFFERENTIAL/PLATELET
Basophils Absolute: 0.1 10*3/uL (ref 0.0–0.2)
Basos: 1 %
EOS (ABSOLUTE): 0.4 10*3/uL (ref 0.0–0.4)
Eos: 5 %
Hematocrit: 29 % — ABNORMAL LOW (ref 34.0–46.6)
Hemoglobin: 9.6 g/dL — ABNORMAL LOW (ref 11.1–15.9)
Immature Grans (Abs): 0 10*3/uL (ref 0.0–0.1)
Immature Granulocytes: 0 %
Lymphocytes Absolute: 2.9 10*3/uL (ref 0.7–3.1)
Lymphs: 38 %
MCH: 30.7 pg (ref 26.6–33.0)
MCHC: 33.1 g/dL (ref 31.5–35.7)
MCV: 93 fL (ref 79–97)
Monocytes Absolute: 0.6 10*3/uL (ref 0.1–0.9)
Monocytes: 8 %
Neutrophils Absolute: 3.7 10*3/uL (ref 1.4–7.0)
Neutrophils: 48 %
Platelets: 294 10*3/uL (ref 150–450)
RBC: 3.13 x10E6/uL — ABNORMAL LOW (ref 3.77–5.28)
RDW: 12.9 % (ref 11.7–15.4)
WBC: 7.6 10*3/uL (ref 3.4–10.8)

## 2020-07-31 LAB — BASIC METABOLIC PANEL
BUN/Creatinine Ratio: 17 (ref 12–28)
BUN: 24 mg/dL (ref 8–27)
CO2: 24 mmol/L (ref 20–29)
Calcium: 9.5 mg/dL (ref 8.7–10.3)
Chloride: 100 mmol/L (ref 96–106)
Creatinine, Ser: 1.41 mg/dL — ABNORMAL HIGH (ref 0.57–1.00)
GFR calc Af Amer: 45 mL/min/{1.73_m2} — ABNORMAL LOW (ref >59)
GFR calc non Af Amer: 39 mL/min/{1.73_m2} — ABNORMAL LOW (ref >59)
Glucose: 87 mg/dL (ref 65–99)
Potassium: 5 mmol/L (ref 3.5–5.2)
Sodium: 137 mmol/L (ref 134–144)

## 2020-07-31 LAB — IRON,TIBC AND FERRITIN PANEL
Ferritin: 351 ng/mL — ABNORMAL HIGH (ref 15–150)
Iron Saturation: 27 % (ref 15–55)
Iron: 53 ug/dL (ref 27–139)
Total Iron Binding Capacity: 200 ug/dL — ABNORMAL LOW (ref 250–450)
UIBC: 147 ug/dL (ref 118–369)

## 2020-07-31 LAB — LIPID PANEL W/O CHOL/HDL RATIO
Cholesterol, Total: 201 mg/dL — ABNORMAL HIGH (ref 100–199)
HDL: 51 mg/dL (ref >39)
LDL Chol Calc (NIH): 114 mg/dL — ABNORMAL HIGH (ref 0–99)
Triglycerides: 207 mg/dL — ABNORMAL HIGH (ref 0–149)
VLDL Cholesterol Cal: 36 mg/dL (ref 5–40)

## 2020-07-31 LAB — VITAMIN B12: Vitamin B-12: >2000 pg/mL — ABNORMAL HIGH (ref 232–1245)

## 2020-07-31 NOTE — Progress Notes (Signed)
Contacted via [place] afternoon [place].  Your labs have returned: - Ferritin level is mildly elevated again, will continue to monitor.  Iron level is mid-range, continue your supplement every day.  Your hemoglobin and hematocrit are a bit lower this check.  You are taking iron every day, correct?  You are due for colonoscopy again in 2024, but if anemia continues may send you to GI doctors sooner for assessment.  I do ask you follow-up with hematology.  Would like to see you again in 4 weeks. - B12 level is good - Cholesterol levels are still a little elevated, make sure to take Atorvastatin every day.   - You continue to show come chronic kidney disease, may be good to have you see a kidney doctor for further assessment since you do have the anemia too.  Sometimes the two go hand in hand.  Are you okay if I place referral to a kidney doctor for you? Keep being awesome!! Kindest regards, Zarin Knupp

## 2020-07-31 NOTE — Chronic Care Management (AMB) (Signed)
  Chronic Care Management   Note  07/31/2020 Name: [name] MRN: [id number] DOB: [birth year]  Kelly Poole is a 69 y.o. year old female who is a primary care patient of Venita Lick, NP and is actively engaged with the care management team. I reached out to Joslyn Devon by phone today to assist with re-scheduling a follow up visit with the Pharmacist.  Follow up plan: Telephone appointment with care management team member scheduled for: 09/09/2020  [place], [employer]  [place], [place] [address] Direct Dial: [email or web address]: [email or web address]

## 2020-07-31 NOTE — Telephone Encounter (Signed)
Patient phoned on this date and stated that she saw her primary on 07-30-20 and does not feel that she needs to see [facility] at this time. Patient requested that appts be cancelled and that she would phone back later to reschedule if needed. Appts cancelled per patient's request.

## 2020-08-02 DIAGNOSIS — C50112 Malignant neoplasm of central portion of left female breast: Secondary | ICD-10-CM | POA: Diagnosis not present

## 2020-08-02 DIAGNOSIS — Z4432 Encounter for fitting and adjustment of external left breast prosthesis: Secondary | ICD-10-CM | POA: Diagnosis not present

## 2020-08-02 NOTE — Assessment & Plan Note (Signed)
Continue yearly mammograms and will work on getting new prosthesis from [facility] for patient.

## 2020-08-05 ENCOUNTER — Telehealth: Payer: Self-pay | Admitting: Nurse Practitioner

## 2020-08-05 ENCOUNTER — Inpatient Hospital Stay: Payer: Medicare HMO

## 2020-08-05 NOTE — Telephone Encounter (Signed)
After hours call. Pt calling to ask if one cup of decaf coffee is ok to drink in mornings as she has St. 3 kidney disease. Advised 1 cup was acceptable.

## 2020-08-06 ENCOUNTER — Inpatient Hospital Stay: Payer: Medicare HMO | Admitting: Oncology

## 2020-08-09 ENCOUNTER — Telehealth: Payer: Medicare HMO | Admitting: General Practice

## 2020-08-09 ENCOUNTER — Ambulatory Visit (INDEPENDENT_AMBULATORY_CARE_PROVIDER_SITE_OTHER): Payer: Medicare HMO | Admitting: General Practice

## 2020-08-09 DIAGNOSIS — N1832 Chronic kidney disease, stage 3b: Secondary | ICD-10-CM

## 2020-08-09 DIAGNOSIS — E7800 Pure hypercholesterolemia, unspecified: Secondary | ICD-10-CM | POA: Diagnosis not present

## 2020-08-09 DIAGNOSIS — I10 Essential (primary) hypertension: Secondary | ICD-10-CM | POA: Diagnosis not present

## 2020-08-09 NOTE — Chronic Care Management (AMB) (Signed)
Chronic Care Management   Follow Up Note   08/09/2020 Name: ANIE JUNIEL MRN: [id number] DOB: September 11, 1951  Referred by: Venita Lick, NP Reason for referral : Chronic Care Management (RNCM Follow up call: Chronic Disease Management and Care Coordination Needs)   Lakshmi VANI GUNNER is a 69 y.o. year old female who is a primary care patient of Cannady, Barbaraann Faster, NP. The CCM team was consulted for assistance with chronic disease management and care coordination needs.    Review of patient status, including review of consultants reports, relevant laboratory and other test results, and collaboration with appropriate care team members and the patient's provider was performed as part of comprehensive patient evaluation and provision of chronic care management services.    SDOH (Social Determinants of Health) assessments performed: Yes See Care Plan activities for detailed interventions related to [facility])     Outpatient Encounter Medications as of 08/09/2020  Medication Sig Note  . Ascorbic Acid (VITAMIN C) 1000 MG tablet Take 1,000 mg by mouth daily.   Marland Kitchen aspirin 81 MG tablet Take 81 mg by mouth daily.   Marland Kitchen atorvastatin (LIPITOR) 10 MG tablet Take 1 tablet (10 mg total) by mouth daily.   . Cetirizine HCl (ALLERGY RELIEF) 10 MG CAPS Take by mouth.   . ferrous sulfate 325 (65 FE) MG tablet Take 1 tablet (325 mg total) by mouth daily with breakfast.   . Garlic 6295 MG CAPS Take 1,000 mg by mouth daily. 10/12/2019: Doesn't take regular  . hydrochlorothiazide (HYDRODIURIL) 25 MG tablet Take 1 tablet (25 mg total) by mouth daily.   Marland Kitchen ketoconazole (NIZORAL) 2 % cream Apply 1 application topically daily. (Patient not taking: Reported on 07/30/2020)   . losartan (COZAAR) 100 MG tablet Take 1 tablet (100 mg total) by mouth daily.   . Multiple Vitamin (MULTIVITAMINS PO) Take by mouth daily.   . mupirocin ointment (BACTROBAN) 2 % Apply 1 application topically 2 (two) times daily.   Marland Kitchen omeprazole  (PRILOSEC) 20 MG capsule Take 1 capsule (20 mg total) by mouth daily.   . TURMERIC PO Take 1 capsule by mouth daily.   . vitamin B-12 (CYANOCOBALAMIN) 1000 MCG tablet Take 1 tablet (1,000 mcg total) by mouth daily.   . vitamin E 100 UNIT capsule Take 100 Units by mouth daily.    No facility-administered encounter medications on file as of 08/09/2020.     Objective:  BP Readings from Last 3 Encounters:  07/30/20 114/75  04/22/20 122/82  04/17/20 132/80    Goals Addressed              This Visit's Progress   .  RNCM-Pt-"I take my blood pressure but it is the wrist cuff" (pt-stated)        CARE PLAN ENTRY (see longtitudinal plan of care for additional care plan information)  Current Barriers:  . Chronic Disease Management support, education, and care coordination needs related to HTN, HLD, and Chronic arthritis pain (knee)  Clinical Goal(s) related to HTN, HLD, and Chronic arthritis pain (knee):  Over the next 120 days, patient will:  . Work with the care management team to address educational, disease management, and care coordination needs  . Begin or continue self health monitoring activities as directed today Measure and record blood pressure 2 times per week and adhere to a heart healthy/renal diet  . Call provider office for new or worsened signs and symptoms Blood pressure findings outside established parameters and New or worsened symptom related to  HLD, chronic arthritis pain and other chronic conditions. . Call care management team with questions or concerns . Verbalize basic understanding of patient centered plan of care established today  Interventions related to HTN, HLD, and Chronic arthritis pain (knee):  Marland Kitchen Evaluation of current treatment plans and patient's adherence to plan as established by provider.  The patient is compliant with the plan of care. Saw pcp recently.  . Assessed patient understanding of disease states.  The patient has a good understanding of her  conditions. 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 Talked about taking care of self and making time for her to have time for herself.  . Assessed patient's education and care coordination needs. Education given to the patient on the normal range for blood pressures. The patient states she has it pretty well managed right now. The patient has a wrist cuff and admits lots of times that it may be off. Denies headaches when her pressures are high. Education to be sent by MyChart and EMMI.  08-09-2020: the patient states her blood pressure continues to be controlled. She is thankful for this. Education and support given on the benefits of taking blood pressure regularly and recording.  . Provided disease specific education to patient: Educational material to be sent to the patient on dietary restrictions and other requested information from the patient. 08-09-2020: The patient is compliant with Heart Healthy diet. Wants to learn more about renal diet restrictions and protection of kidneys.  Nash Dimmer with appropriate clinical care team members regarding patient needs.  Will ask the CCM pharmacist to assist with medication review and medications that may be nephrotoxic. The patient is eager to learn what she can to protect her health and well being.   Patient Self Care Activities related toHTN, HLD, and Chronic arthritis pain (knee):  Marland Kitchen Patient is unable to independently self-manage chronic health conditions  Please see past updates related to this goal by clicking on the "Past Updates" button in the selected goal      .  RNCM: Pt-"I am really concerned about my kidneys" (pt-stated)        CARE PLAN ENTRY (see longitudinal plan of care for additional care plan information)  Current Barriers:  Marland Kitchen Knowledge Deficits related to kidney function and medication of hydrochlorothiazide decreasing her kidney function . Care Coordination needs related to educational needs in a patient with CKD III  (disease states) . Chronic Disease Management support and education needs related to renal function and abnormal labs related to medications and the patient wanting to understand her conditions better  Nurse Case Manager Clinical Goal(s):  Marland Kitchen Over the next 120 days, patient will verbalize understanding of plan for CKDIII . Over the next 120 days, patient will work with [facility] team and pcp to address needs related to kidney function and understanding CKD III . Over the next 120 days, patient will attend all scheduled medical appointments: pcp follow up 07/30/2020 with lab work follow up on 05/16/2020 for repeat labs related to kidney function . Over the next 120 days, patient will verbalize basic understanding of CKDIII disease process and self health management plan as evidenced by understanding kidney function, how life style changes and diet can help in maintaining healthy kidney function. . Over the next 120 days, patient will demonstrate understanding of rationale for each prescribed medication as evidenced by compliance and review of medications with RNCM and pharmacist  . Over the next 120 days, patient will work with CM  team pharmacist to understand BETA Blockers better and help with questions she has related to the medications she takes.   Interventions:  . Inter-disciplinary care team collaboration (see longitudinal plan of care) . Evaluation of current treatment plan related to CKDIII and patient's adherence to plan as established by provider. The patient is concerned about her kidney function and wants to know all she can so she can make sure she is doing her part keep her kidneys from delcining further. Education and support given. Sees kidney specialist in September.  . Advised patient to follow recommendations of pcp with decreasing the dose of Hydrochlorothiazide and having follow up labs on 05/16/2020.  08-09-2020: the patient saw the pcp recently and had labwork done. The patient states  that she is eager to talk to the specialist. Review of what can cause renal function decline. The patient given resources.  . Provided education to patient re: renal function, CKDIII, and dietary restrictions for renal diet.  08-09-2020: The patient wants to learn all she can about improving her dietary habits and eating things that are good for her. Agrees to get information by [facility] and my chart. The patient called back briefly and ask about pomegranate juice for kidney health. Reviewed information with the patient.  . Reviewed medications with patient and discussed compliance. The patient ask question about the Hydrochlorothiazide and if decreasing the dose what does a BETA blocker mean?  Education provided and will do a pharmacy referral.  08-09-2020: Will ask for pharmacist to reach out to the patient to discuss medications that may be nephrotoxic.  Marland Kitchen Discussed plans with patient for ongoing care management follow up and provided patient with direct contact information for care management team . Pharmacy referral for education and support on possibly having the start taking a BETA blocker if the reduced dose of hydrochlorothiazide does not work.  Also for medications that may be nephrotoxic . Evaluation of activity level. The patient is going to the gym and working out. She is being proactive in her care. Praised for accomplishments.   Patient Self Care Activities:  . Patient verbalizes understanding of plan to work with CCM team to help her meet her health and wellness needs . Self administers medications as prescribed . Attends all scheduled provider appointments . Calls provider office for new concerns or questions . Unable to independently manage CKDIII  Please see past updates related to this goal by clicking on the "Past Updates" button in the selected goal          Plan:   Telephone follow up appointment with care management team member scheduled for: 10-04-2020 at 0900 am   Noreene Larsson  RN, MSN, [facility] Mobile: [phone]

## 2020-08-09 NOTE — Patient Instructions (Signed)
Visit Information  Goals Addressed              This Visit's Progress   .  RNCM-Pt-"I take my blood pressure but it is the wrist cuff" (pt-stated)        CARE PLAN ENTRY (see longtitudinal plan of care for additional care plan information)  Current Barriers:  . Chronic Disease Management support, education, and care coordination needs related to HTN, HLD, and Chronic arthritis pain (knee)  Clinical Goal(s) related to HTN, HLD, and Chronic arthritis pain (knee):  Over the next 120 days, patient will:  . Work with the care management team to address educational, disease management, and care coordination needs  . Begin or continue self health monitoring activities as directed today Measure and record blood pressure 2 times per week and adhere to a heart healthy/renal diet  . Call provider office for new or worsened signs and symptoms Blood pressure findings outside established parameters and New or worsened symptom related to HLD, chronic arthritis pain and other chronic conditions. . Call care management team with questions or concerns . Verbalize basic understanding of patient centered plan of care established today  Interventions related to HTN, HLD, and Chronic arthritis pain (knee):  Marland Kitchen Evaluation of current treatment plans and patient's adherence to plan as established by provider.  The patient is compliant with the plan of care. Saw pcp recently.  . Assessed patient understanding of disease states.  The patient has a good understanding of her conditions. 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 Talked about taking care of self and making time for her to have time for herself.  . Assessed patient's education and care coordination needs. Education given to the patient on the normal range for blood pressures. The patient states she has it pretty well managed right now. The patient has a wrist cuff and admits lots of times that it may be off. Denies headaches when her pressures  are high. Education to be sent by MyChart and EMMI.  08-09-2020: the patient states her blood pressure continues to be controlled. She is thankful for this. Education and support given on the benefits of taking blood pressure regularly and recording.  . Provided disease specific education to patient: Educational material to be sent to the patient on dietary restrictions and other requested information from the patient. 08-09-2020: The patient is compliant with Heart Healthy diet. Wants to learn more about renal diet restrictions and protection of kidneys.  Nash Dimmer with appropriate clinical care team members regarding patient needs.  Will ask the CCM pharmacist to assist with medication review and medications that may be nephrotoxic. The patient is eager to learn what she can to protect her health and well being.   Patient Self Care Activities related toHTN, HLD, and Chronic arthritis pain (knee):  Marland Kitchen Patient is unable to independently self-manage chronic health conditions  Please see past updates related to this goal by clicking on the "Past Updates" button in the selected goal      .  RNCM: Pt-"I am really concerned about my kidneys" (pt-stated)        CARE PLAN ENTRY (see longitudinal plan of care for additional care plan information)  Current Barriers:  Marland Kitchen Knowledge Deficits related to kidney function and medication of hydrochlorothiazide decreasing her kidney function . Care Coordination needs related to educational needs in a patient with CKD III (disease states) . Chronic Disease Management support and education needs related to renal function and abnormal labs  related to medications and the patient wanting to understand her conditions better  Nurse Case Manager Clinical Goal(s):  Marland Kitchen Over the next 120 days, patient will verbalize understanding of plan for CKDIII . Over the next 120 days, patient will work with [facility] [place] team and pcp to address needs related to kidney function and  understanding CKD III . Over the next 120 days, patient will attend all scheduled medical appointments: pcp follow up 07/30/2020 with lab work follow up on 05/16/2020 for repeat labs related to kidney function . Over the next 120 days, patient will verbalize basic understanding of CKDIII disease process and self health management plan as evidenced by understanding kidney function, how life style changes and diet can help in maintaining healthy kidney function. . Over the next 120 days, patient will demonstrate understanding of rationale for each prescribed medication as evidenced by compliance and review of medications with RNCM and pharmacist  . Over the next 120 days, patient will work with CM team pharmacist to understand BETA Blockers better and help with questions she has related to the medications she takes.   Interventions:  . Inter-disciplinary care team collaboration (see longitudinal plan of care) . Evaluation of current treatment plan related to CKDIII and patient's adherence to plan as established by provider. The patient is concerned about her kidney function and wants to know all she can so she can make sure she is doing her part keep her kidneys from delcining further. Education and support given. Sees kidney specialist in September.  . Advised patient to follow recommendations of pcp with decreasing the dose of Hydrochlorothiazide and having follow up labs on 05/16/2020.  08-09-2020: the patient saw the pcp recently and had labwork done. The patient states that she is eager to talk to the specialist. Review of what can cause renal function decline. The patient given resources.  . Provided education to patient re: renal function, CKDIII, and dietary restrictions for renal diet.  08-09-2020: The patient wants to learn all she can about improving her dietary habits and eating things that are good for her. Agrees to get information by [facility] and my chart. The patient called back briefly and ask about  pomegranate juice for kidney health. Reviewed information with the patient.  . Reviewed medications with patient and discussed compliance. The patient ask question about the Hydrochlorothiazide and if decreasing the dose what does a BETA blocker mean?  Education provided and will do a pharmacy referral.  08-09-2020: Will ask for pharmacist to reach out to the patient to discuss medications that may be nephrotoxic.  Marland Kitchen Discussed plans with patient for ongoing care management follow up and provided patient with direct contact information for care management team . Pharmacy referral for education and support on possibly having the start taking a BETA blocker if the reduced dose of hydrochlorothiazide does not work.  Also for medications that may be nephrotoxic . Evaluation of activity level. The patient is going to the gym and working out. She is being proactive in her care. Praised for accomplishments.   Patient Self Care Activities:  . Patient verbalizes understanding of plan to work with CCM team to help her meet her health and wellness needs . Self administers medications as prescribed . Attends all scheduled provider appointments . Calls provider office for new concerns or questions . Unable to independently manage CKDIII  Please see past updates related to this goal by clicking on the "Past Updates" button in the selected goal  Patient verbalizes understanding of instructions provided today.   Telephone follow up appointment with care management team member scheduled for: 10-04-2020 at 0900  [place], MSN, [facility] Mobile: [phone]   Chronic Kidney Disease, Adult Chronic kidney disease (CKD) happens when the kidneys are damaged over a long period of time. The kidneys are two organs that help with:  Getting rid of waste and extra fluid from the blood.  Making hormones that maintain  the amount of fluid in your tissues and blood vessels.  Making sure that the body has the right amount of fluids and chemicals. Most of the time, CKD does not go away, but it can usually be controlled. Steps must be taken to slow down the kidney damage or to stop it from getting worse. If this is not done, the kidneys may stop working. Follow these instructions at home: Medicines  Take over-the-counter and prescription medicines only as told by your doctor. You may need to change the amount of medicines you take.  Do not take any new medicines unless your doctor says it is okay. Many medicines can make your kidney damage worse.  Do not take any vitamin and supplements unless your doctor says it is okay. Many vitamins and supplements can make your kidney damage worse. General instructions  Follow a diet as told by your doctor. You may need to stay away from: ? Alcohol. ? Salty foods. ? Foods that are high in:  Potassium.  Calcium.  Protein.  Do not use any products that contain nicotine or tobacco, such as cigarettes and e-cigarettes. If you need help quitting, ask your doctor.  Keep track of your blood pressure at home. Tell your doctor about any changes.  If you have diabetes, keep track of your blood sugar as told by your doctor.  Try to stay at a healthy weight. If you need help, ask your doctor.  Exercise at least 30 minutes a day, 5 days a week.  Stay up-to-date with your shots (immunizations) as told by your doctor.  Keep all follow-up visits as told by your doctor. This is important. Contact a doctor if:  Your symptoms get worse.  You have new symptoms. Get help right away if:  You have symptoms of end-stage kidney disease. These may include: ? Headaches. ? Numbness in your hands or feet. ? Easy bruising. ? Having hiccups often. ? Chest pain. ? Shortness of breath. ? Stopping of menstrual periods in women.  You have a fever.  You have very little pee  (urine).  You have pain or bleeding when you pee. Summary  Chronic kidney disease (CKD) happens when the kidneys are damaged over a long period of time.  Most of the time, this condition does not go away, but it can usually be controlled. Steps must be taken to slow down the kidney damage or to stop it from getting worse.  Treatment may include a combination of medicines and lifestyle changes. This information is not intended to replace advice given to you by your health care provider. Make sure you discuss any questions you have with your health care provider. Document Revised: 11/19/2017 Document Reviewed: 01/11/2017 Elsevier Patient Education  2020 [place] for Chronic Kidney Disease When your kidneys are not working well, they cannot remove waste and excess substances from your blood as effectively as they did before. This can lead to a buildup and imbalance of  these substances, which can worsen kidney damage and affect how your body functions. Certain foods lead to a buildup of these substances in the body. By changing your diet as recommended by your diet and nutrition specialist (dietitian) or health care provider, you could help prevent further kidney damage and delay or prevent the need for dialysis. What are tips for following this plan? General instructions   Work with your health care provider and dietitian to develop a meal plan that is right for you. Foods you can eat, limit, or avoid will be different for each person depending on the stage of kidney disease and any other existing health conditions.  Talk with your health care provider about whether you should take a vitamin and mineral supplement.  Use standard measuring cups and spoons to measure servings of foods. Use a kitchen scale to measure portions of protein foods.  If directed by your health care provider, avoid drinking too much fluid. Measure and count all liquids, including water, ice, soups,  flavored gelatin, and frozen desserts such as popsicles or ice cream. Reading food labels  Check the amount of sodium in foods. Choose foods that have less than 300 milligrams (mg) per serving.  Check the ingredient list for phosphorus or potassium-based additives or preservatives.  Check the amount of saturated and trans fat. Limit or avoid these fats as told by your dietitian. Shopping  Avoid buying foods that are: ? Processed, frozen, or prepackaged. ? Calcium-enriched or fortified.  Do not buy foods that have salt or sodium listed among the first five ingredients.  Do not buy canned vegetables. Cooking  Replace animal proteins, such as meat, fish, eggs, or dairy, with plant proteins from beans, nuts, and soy. ? Use soy milk instead of cow's milk. ? Add beans or tofu to soups, casseroles, or pasta dishes instead of meat.  Soak vegetables, such as potatoes, before cooking to reduce potassium. To do this: ? Peel and cut into small pieces. ? Soak in warm water for at least 2 hours. For every 1 cup of vegetables, use 10 cups of water. ? Drain and rinse with warm water. ? Boil for at least 5 minutes. Meal planning  Limit the amount of protein from plant and animal sources you eat each day.  Do not add salt to food when cooking or before eating.  Eat meals and snacks at around the same time each day. If you have diabetes:  If you have diabetes (diabetes mellitus) and chronic kidney disease, it is important to keep your blood glucose in the target range recommended by your health care provider. Follow your diabetes management plan. This may include: ? Checking your blood glucose regularly. ? Taking oral medicines, insulin, or both. ? Exercising for at least 30 minutes on 5 or more days each week, or as told by your health care provider. ? Tracking how many servings of carbohydrates you eat at each meal.  You may be given specific guidelines on how much of certain foods and  nutrients you may eat, depending on your stage of kidney disease and whether you have high blood pressure (hypertension). Follow your meal plan as told by your dietitian. What nutrients should be limited? The items listed are not a complete list. Talk with your dietitian about what dietary choices are best for you. Potassium Potassium affects how steadily your heart beats. If too much potassium builds up in your blood, it can cause an irregular heartbeat or even a heart attack. You  may need to eat less potassium, depending on your blood potassium levels and the stage of kidney disease. Talk to your dietitian about how much potassium you may have each day. You may need to limit or avoid foods that are high in potassium, such as:  Milk and soy milk.  Fruits, such as bananas, papaya, apricots, nectarines, melon, prunes, raisins, kiwi, and oranges.  Vegetables, such as potatoes, sweet potatoes, yams, tomatoes, leafy greens, beets, okra, avocado, pumpkin, and winter squash.  White and lima beans. Phosphorus Phosphorus is a mineral found in your bones. A balance between calcium and phosphorous is needed to build and maintain healthy bones. Too much phosphorus pulls calcium from your bones. This can make your bones weak and more likely to break. Too much phosphorus can also make your skin itch. You may need to eat less phosphorus depending on your blood phosphorus levels and the stage of kidney disease. Talk to your dietitian about how much potassium you may have each day. You may need to take medicine to lower your blood phosphorus levels if diet changes do not help. You may need to limit or avoid foods that are high in phosphorus, such as:  Milk and dairy products.  Dried beans and peas.  Tofu, soy milk, and other soy-based meat replacements.  Colas.  Nuts and peanut butter.  Meat, poultry, and fish.  Bran cereals and oatmeals. Protein Protein helps you to make and keep muscle. It also  helps in the repair of your body's cells and tissues. One of the natural breakdown products of protein is a waste product called urea. When your kidneys are not working properly, they cannot remove wastes, such as urea, like they did before you developed chronic kidney disease. Reducing how much protein you eat can help prevent a buildup of urea in your blood. Depending on your stage of kidney disease, you may need to limit foods that are high in protein. Sources of animal protein include:  Meat (all types).  Fish and seafood.  Poultry.  Eggs.  Dairy. Other protein foods include:  Beans and legumes.  Nuts and nut butter.  Soy and tofu. Sodium Sodium, which is found in salt, helps maintain a healthy balance of fluids in your body. Too much sodium can increase your blood pressure and have a negative effect on the function of your heart and lungs. Too much sodium can also cause your body to retain too much fluid, making your kidneys work harder. Most people should have less than 2,300 milligrams (mg) of sodium each day. If you have hypertension, you may need to limit your sodium to 1,500 mg each day. Talk to your dietitian about how much sodium you may have each day. You may need to limit or avoid foods that are high in sodium, such as:  Salt seasonings.  Soy sauce.  Cured and processed meats.  Salted crackers and snack foods.  Fast food.  Canned soups and most canned foods.  Pickled foods.  Vegetable juice.  Boxed mixes or ready-to-eat boxed meals and side dishes.  Bottled dressings, sauces, and marinades. Summary  Chronic kidney disease can lead to a buildup and imbalance of waste and excess substances in the body. Certain foods lead to a buildup of these substances. By adjusting your intake of these foods, you could help prevent more kidney damage and delay or prevent the need for dialysis.  Food adjustments are different for each person with chronic kidney disease.  Work with a Microbiologist to  set up nutrient goals and a meal plan that is right for you.  If you have diabetes and chronic kidney disease, it is important to keep your blood glucose in the target range recommended by your health care provider. This information is not intended to replace advice given to you by your health care provider. Make sure you discuss any questions you have with your health care provider. Document Revised: 03/30/2019 Document Reviewed: 12/02/2016 Elsevier Patient Education  2020 [employer].

## 2020-08-13 ENCOUNTER — Telehealth: Payer: Medicare HMO

## 2020-08-21 ENCOUNTER — Ambulatory Visit (INDEPENDENT_AMBULATORY_CARE_PROVIDER_SITE_OTHER): Payer: Medicare HMO | Admitting: General Practice

## 2020-08-21 DIAGNOSIS — D509 Iron deficiency anemia, unspecified: Secondary | ICD-10-CM | POA: Diagnosis not present

## 2020-08-21 DIAGNOSIS — I10 Essential (primary) hypertension: Secondary | ICD-10-CM | POA: Diagnosis not present

## 2020-08-21 DIAGNOSIS — E7800 Pure hypercholesterolemia, unspecified: Secondary | ICD-10-CM

## 2020-08-21 DIAGNOSIS — N1832 Chronic kidney disease, stage 3b: Secondary | ICD-10-CM

## 2020-08-21 NOTE — Chronic Care Management (AMB) (Signed)
Chronic Care Management   Follow Up Note   08/21/2020 Name: Kelly Poole MRN: [id number] DOB: [birth year]  Referred by: Venita Lick, NP Reason for referral : Chronic Care Management (RNCM follow up call. The patient had called and left a VM and ask for a return call. Chronic Disease Management and Care coordination needs)   Kelly Poole is a 69 y.o. year old female who is a primary care patient of Cannady, Barbaraann Faster, NP. The CCM team was consulted for assistance with chronic disease management and care coordination needs.    Review of patient status, including review of consultants reports, relevant laboratory and other test results, and collaboration with appropriate care team members and the patient's provider was performed as part of comprehensive patient evaluation and provision of chronic care management services.    SDOH (Social Determinants of Health) assessments performed: Yes See Care Plan activities for detailed interventions related to [facility])     Outpatient Encounter Medications as of 08/21/2020  Medication Sig Note  . Ascorbic Acid (VITAMIN C) 1000 MG tablet Take 1,000 mg by mouth daily.   Marland Kitchen aspirin 81 MG tablet Take 81 mg by mouth daily.   Marland Kitchen atorvastatin (LIPITOR) 10 MG tablet Take 1 tablet (10 mg total) by mouth daily.   . Cetirizine HCl (ALLERGY RELIEF) 10 MG CAPS Take by mouth.   . ferrous sulfate 325 (65 FE) MG tablet Take 1 tablet (325 mg total) by mouth daily with breakfast.   . Garlic 4315 MG CAPS Take 1,000 mg by mouth daily. 10/12/2019: Doesn't take regular  . hydrochlorothiazide (HYDRODIURIL) 25 MG tablet Take 1 tablet (25 mg total) by mouth daily.   Marland Kitchen ketoconazole (NIZORAL) 2 % cream Apply 1 application topically daily. (Patient not taking: Reported on 07/30/2020)   . losartan (COZAAR) 100 MG tablet Take 1 tablet (100 mg total) by mouth daily.   . Multiple Vitamin (MULTIVITAMINS PO) Take by mouth daily.   . mupirocin ointment (BACTROBAN) 2 % Apply 1  application topically 2 (two) times daily.   Marland Kitchen omeprazole (PRILOSEC) 20 MG capsule Take 1 capsule (20 mg total) by mouth daily.   . TURMERIC PO Take 1 capsule by mouth daily.   . vitamin B-12 (CYANOCOBALAMIN) 1000 MCG tablet Take 1 tablet (1,000 mcg total) by mouth daily.   . vitamin E 100 UNIT capsule Take 100 Units by mouth daily.    No facility-administered encounter medications on file as of 08/21/2020.     Objective:  BP Readings from Last 3 Encounters:  07/30/20 114/75  04/22/20 122/82  04/17/20 132/80    Goals Addressed              This Visit's Progress   .  RNCM-Pt-"I take my blood pressure but it is the wrist cuff" (pt-stated)        CARE PLAN ENTRY (see longtitudinal plan of care for additional care plan information)  Current Barriers:  . Chronic Disease Management support, education, and care coordination needs related to HTN, HLD, and Chronic arthritis pain (knee)  Clinical Goal(s) related to HTN, HLD, and Chronic arthritis pain (knee):  Over the next 120 days, patient will:  . Work with the care management team to address educational, disease management, and care coordination needs  . Begin or continue self health monitoring activities as directed today Measure and record blood pressure 2 times per week and adhere to a heart healthy/renal diet  . Call provider office for new or worsened signs and  symptoms Blood pressure findings outside established parameters and New or worsened symptom related to HLD, chronic arthritis pain and other chronic conditions. . Call care management team with questions or concerns . Verbalize basic understanding of patient centered plan of care established today  Interventions related to HTN, HLD, and Chronic arthritis pain (knee):  Marland Kitchen Evaluation of current treatment plans and patient's adherence to plan as established by provider.  The patient is compliant with the plan of care. Saw pcp recently.  . Assessed patient understanding of  disease states.  The patient has a good understanding of her conditions. 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 Talked about taking care of self and making time for her to have time for herself.  . Assessed patient's education and care coordination needs. Education given to the patient on the normal range for blood pressures. The patient states she has it pretty well managed right now. The patient has a wrist cuff and admits lots of times that it may be off. Denies headaches when her pressures are high. Education to be sent by MyChart and EMMI.  08-09-2020: the patient states her blood pressure continues to be controlled. She is thankful for this. Education and support given on the benefits of taking blood pressure regularly and recording.  . Provided disease specific education to patient: Educational material to be sent to the patient on dietary restrictions and other requested information from the patient. 08-09-2020: The patient is compliant with Heart Healthy diet. Wants to learn more about renal diet restrictions and protection of kidneys. 08-21-2020: The patient wanted reassurance that she was making the right dietary choices and limiting her sodium. She wants to do everything she can to prevent further damage to her kidneys and heart.  Nash Dimmer with appropriate clinical care team members regarding patient needs.  Will ask the CCM pharmacist to assist with medication review and medications that may be nephrotoxic. The patient is eager to learn what she can to protect her health and well being.   Patient Self Care Activities related toHTN, HLD, and Chronic arthritis pain (knee):  Marland Kitchen Patient is unable to independently self-manage chronic health conditions  Please see past updates related to this goal by clicking on the "Past Updates" button in the selected goal      .  RNCM: Pt-"I am really concerned about my kidneys" (pt-stated)        CARE PLAN ENTRY (see longitudinal plan of care for  additional care plan information)  Current Barriers:  Marland Kitchen Knowledge Deficits related to kidney function and medication of hydrochlorothiazide decreasing her kidney function . Care Coordination needs related to educational needs in a patient with CKD III (disease states) . Chronic Disease Management support and education needs related to renal function and abnormal labs related to medications and the patient wanting to understand her conditions better  Nurse Case Manager Clinical Goal(s):  Marland Kitchen Over the next 120 days, patient will verbalize understanding of plan for CKDIII . Over the next 120 days, patient will work with [facility] [place] team and pcp to address needs related to kidney function and understanding CKD III . Over the next 120 days, patient will attend all scheduled medical appointments: pcp follow up 07/30/2020 with lab work follow up on 05/16/2020 for repeat labs related to kidney function . Over the next 120 days, patient will verbalize basic understanding of CKDIII disease process and self health management plan as evidenced by understanding kidney function, how life style changes and diet  can help in maintaining healthy kidney function. . Over the next 120 days, patient will demonstrate understanding of rationale for each prescribed medication as evidenced by compliance and review of medications with RNCM and pharmacist  . Over the next 120 days, patient will work with CM team pharmacist to understand BETA Blockers better and help with questions she has related to the medications she takes.   Interventions:  . Inter-disciplinary care team collaboration (see longitudinal plan of care) . Evaluation of current treatment plan related to CKDIII and patient's adherence to plan as established by provider. The patient is concerned about her kidney function and wants to know all she can so she can make sure she is doing her part keep her kidneys from delcining further. Education and support given. Sees  kidney specialist in September.  . Advised patient to follow recommendations of pcp with decreasing the dose of Hydrochlorothiazide and having follow up labs on 05/16/2020.  08-09-2020: the patient saw the pcp recently and had labwork done. The patient states that she is eager to talk to the specialist. Review of what can cause renal function decline. The patient given resources.  . Provided education to patient re: renal function, CKDIII, and dietary restrictions for renal diet.  08-09-2020: The patient wants to learn all she can about improving her dietary habits and eating things that are good for her. Agrees to get information by [facility] and my chart. The patient called back briefly and ask about pomegranate juice for kidney health. Reviewed information with the patient. 08-21-2020: The patient had called and left a VM asking for a call back. The patient called back. She is concerned about cramps if she eats no salt in her diet. Education and support given. Explained 1 teaspoon of salt is equal to 2300 mg of sodium therefore she would need to use a little less than a teaspoon of salt in a days time. She states she does not use this much at all. She does use other herbs, spices and a seasoning called "complete seasoning".  The patient wants to make sure she is doing her part to not harm her kidneys any further. She also is drinking pomegranate juice and she says she is having good urinary habits. Praised the patient for good habits and changing her lifestyle habits to improve her health and well being.  . Reviewed medications with patient and discussed compliance. The patient ask question about the Hydrochlorothiazide and if decreasing the dose what does a BETA blocker mean?  Education provided and will do a pharmacy referral.  08-09-2020: Will ask for pharmacist to reach out to the patient to discuss medications that may be nephrotoxic.  Marland Kitchen Discussed plans with patient for ongoing care management follow up and  provided patient with direct contact information for care management team . Pharmacy referral for education and support on possibly having the start taking a BETA blocker if the reduced dose of hydrochlorothiazide does not work.  Also for medications that may be nephrotoxic . Evaluation of activity level. The patient is going to the gym and working out. She is being proactive in her care. Praised for accomplishments.   Patient Self Care Activities:  . Patient verbalizes understanding of plan to work with CCM team to help her meet her health and wellness needs . Self administers medications as prescribed . Attends all scheduled provider appointments . Calls provider office for new concerns or questions . Unable to independently manage CKDIII  Please see past updates related to  this goal by clicking on the "Past Updates" button in the selected goal          Plan:   Telephone follow up appointment with care management team member scheduled for: 10-04-2020 at 0900 am    Noreene Larsson RN, MSN, [facility] Mobile: [phone]

## 2020-08-21 NOTE — Patient Instructions (Signed)
Visit Information  Goals Addressed              This Visit's Progress   .  RNCM-Pt-"I take my blood pressure but it is the wrist cuff" (pt-stated)        CARE PLAN ENTRY (see longtitudinal plan of care for additional care plan information)  Current Barriers:  . Chronic Disease Management support, education, and care coordination needs related to HTN, HLD, and Chronic arthritis pain (knee)  Clinical Goal(s) related to HTN, HLD, and Chronic arthritis pain (knee):  Over the next 120 days, patient will:  . Work with the care management team to address educational, disease management, and care coordination needs  . Begin or continue self health monitoring activities as directed today Measure and record blood pressure 2 times per week and adhere to a heart healthy/renal diet  . Call provider office for new or worsened signs and symptoms Blood pressure findings outside established parameters and New or worsened symptom related to HLD, chronic arthritis pain and other chronic conditions. . Call care management team with questions or concerns . Verbalize basic understanding of patient centered plan of care established today  Interventions related to HTN, HLD, and Chronic arthritis pain (knee):  Marland Kitchen Evaluation of current treatment plans and patient's adherence to plan as established by provider.  The patient is compliant with the plan of care. Saw pcp recently.  . Assessed patient understanding of disease states.  The patient has a good understanding of her conditions. 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 Talked about taking care of self and making time for her to have time for herself.  . Assessed patient's education and care coordination needs. Education given to the patient on the normal range for blood pressures. The patient states she has it pretty well managed right now. The patient has a wrist cuff and admits lots of times that it may be off. Denies headaches when her pressures  are high. Education to be sent by MyChart and EMMI.  08-09-2020: the patient states her blood pressure continues to be controlled. She is thankful for this. Education and support given on the benefits of taking blood pressure regularly and recording.  . Provided disease specific education to patient: Educational material to be sent to the patient on dietary restrictions and other requested information from the patient. 08-09-2020: The patient is compliant with Heart Healthy diet. Wants to learn more about renal diet restrictions and protection of kidneys. 08-21-2020: The patient wanted reassurance that she was making the right dietary choices and limiting her sodium. She wants to do everything she can to prevent further damage to her kidneys and heart.  Nash Dimmer with appropriate clinical care team members regarding patient needs.  Will ask the CCM pharmacist to assist with medication review and medications that may be nephrotoxic. The patient is eager to learn what she can to protect her health and well being.   Patient Self Care Activities related toHTN, HLD, and Chronic arthritis pain (knee):  Marland Kitchen Patient is unable to independently self-manage chronic health conditions  Please see past updates related to this goal by clicking on the "Past Updates" button in the selected goal      .  RNCM: Pt-"I am really concerned about my kidneys" (pt-stated)        CARE PLAN ENTRY (see longitudinal plan of care for additional care plan information)  Current Barriers:  Marland Kitchen Knowledge Deficits related to kidney function and medication of hydrochlorothiazide decreasing her  kidney function . Care Coordination needs related to educational needs in a patient with CKD III (disease states) . Chronic Disease Management support and education needs related to renal function and abnormal labs related to medications and the patient wanting to understand her conditions better  Nurse Case Manager Clinical Goal(s):  Marland Kitchen Over the  next 120 days, patient will verbalize understanding of plan for CKDIII . Over the next 120 days, patient will work with [facility] [place] team and pcp to address needs related to kidney function and understanding CKD III . Over the next 120 days, patient will attend all scheduled medical appointments: pcp follow up 07/30/2020 with lab work follow up on 05/16/2020 for repeat labs related to kidney function . Over the next 120 days, patient will verbalize basic understanding of CKDIII disease process and self health management plan as evidenced by understanding kidney function, how life style changes and diet can help in maintaining healthy kidney function. . Over the next 120 days, patient will demonstrate understanding of rationale for each prescribed medication as evidenced by compliance and review of medications with RNCM and pharmacist  . Over the next 120 days, patient will work with CM team pharmacist to understand BETA Blockers better and help with questions she has related to the medications she takes.   Interventions:  . Inter-disciplinary care team collaboration (see longitudinal plan of care) . Evaluation of current treatment plan related to CKDIII and patient's adherence to plan as established by provider. The patient is concerned about her kidney function and wants to know all she can so she can make sure she is doing her part keep her kidneys from delcining further. Education and support given. Sees kidney specialist in September.  . Advised patient to follow recommendations of pcp with decreasing the dose of Hydrochlorothiazide and having follow up labs on 05/16/2020.  08-09-2020: the patient saw the pcp recently and had labwork done. The patient states that she is eager to talk to the specialist. Review of what can cause renal function decline. The patient given resources.  . Provided education to patient re: renal function, CKDIII, and dietary restrictions for renal diet.  08-09-2020: The patient wants  to learn all she can about improving her dietary habits and eating things that are good for her. Agrees to get information by [employer] and my chart. The patient called back briefly and ask about pomegranate juice for kidney health. Reviewed information with the patient. 08-21-2020: The patient had called and left a VM asking for a call back. The patient called back. She is concerned about cramps if she eats no salt in her diet. Education and support given. Explained 1 teaspoon of salt is equal to 2300 mg of sodium therefore she would need to use a little less than a teaspoon of salt in a days time. She states she does not use this much at all. She does use other herbs, spices and a seasoning called "complete seasoning".  The patient wants to make sure she is doing her part to not harm her kidneys any further. She also is drinking pomegranate juice and she says she is having good urinary habits. Praised the patient for good habits and changing her lifestyle habits to improve her health and well being.  . Reviewed medications with patient and discussed compliance. The patient ask question about the Hydrochlorothiazide and if decreasing the dose what does a BETA blocker mean?  Education provided and will do a pharmacy referral.  08-09-2020: Will ask for pharmacist  to reach out to the patient to discuss medications that may be nephrotoxic.  Marland Kitchen Discussed plans with patient for ongoing care management follow up and provided patient with direct contact information for care management team . Pharmacy referral for education and support on possibly having the start taking a BETA blocker if the reduced dose of hydrochlorothiazide does not work.  Also for medications that may be nephrotoxic . Evaluation of activity level. The patient is going to the gym and working out. She is being proactive in her care. Praised for accomplishments.   Patient Self Care Activities:  . Patient verbalizes understanding of plan to work with CCM  team to help her meet her health and wellness needs . Self administers medications as prescribed . Attends all scheduled provider appointments . Calls provider office for new concerns or questions . Unable to independently manage CKDIII  Please see past updates related to this goal by clicking on the "Past Updates" button in the selected goal         Patient verbalizes understanding of instructions provided today.   Telephone follow up appointment with care management team member scheduled for: 10-04-2020 at 0900  [place], MSN, [facility] Mobile: [phone]   Cooking With Less Salt Cooking with less salt is one way to reduce the amount of sodium you get from food. Depending on your condition and overall health, your health care provider or diet and nutrition specialist (dietitian) may recommend that you reduce your sodium intake. Most people should have less than 2,300 milligrams (mg) of sodium each day. If you have high blood pressure (hypertension), you may need to limit your sodium to 1,500 mg each day. Follow the tips below to help reduce your sodium intake. What do I need to know about cooking with less salt? Shopping  Buy sodium-free or low-sodium products. Look for the following words on food labels: ? Low-sodium. ? Sodium-free. ? Reduced-sodium. ? No salt added. ? Unsalted.  Buy fresh or frozen vegetables. Avoid canned vegetables.  Avoid buying meats or protein foods that have been injected with broth or saline solution.  Avoid cured or smoked meats, such as hot dogs, bacon, salami, ham, and bologna. Reading food labels   Check the food label before buying or using packaged ingredients.  Look for products with no more than 140 mg of sodium in one serving.  Do not choose foods with salt as one of the first three ingredients on the ingredients list. If salt is one of the first  three ingredients, it usually means the item is high in sodium, because ingredients are listed in order of amount in the food item. Cooking  Use herbs, seasonings without salt, and spices as substitutes for salt in foods.  Use sodium-free baking soda when baking.  Grill, braise, or roast foods to add flavor with less salt.  Avoid adding salt to pasta, rice, or hot cereals while cooking.  Drain and rinse canned vegetables before use.  Avoid adding salt when cooking sweets and desserts.  Cook with low-sodium ingredients. What are some salt alternatives? The following are herbs, seasonings, and spices that can be used instead of salt to give taste to your food. Herbs should be fresh or dried. Do not choose packaged mixes. Next to the name of the herb, spice, or seasoning are some examples of foods you can pair it with. Herbs  Bay leaves - Soups, meat and vegetable  dishes, and spaghetti sauce.  Basil - [employer], soups, pasta, and fish dishes.  Cilantro - Meat, poultry, and vegetable dishes.  Chili powder - Marinades and Mexican dishes.  Chives - Salad dressings and potato dishes.  Cumin - Mexican dishes, couscous, and meat dishes.  Dill - Fish dishes, sauces, and salads.  Fennel - Meat and vegetable dishes, breads, and cookies.  Garlic (do not use garlic salt) - [place] dishes, meat dishes, salad dressings, and sauces.  Marjoram - Soups, potato dishes, and meat dishes.  Oregano - Pizza and spaghetti sauce.  Parsley - Salads, soups, pasta, and meat dishes.  Rosemary - [place] dishes, salad dressings, soups, and red meats.  Saffron - Fish dishes, pasta, and some poultry dishes.  Sage - Stuffings and sauces.  Tarragon - Fish and [employer].  Thyme - Stuffing, meat, and fish dishes. Seasonings  Lemon juice - Fish dishes, poultry dishes, vegetables, and salads.  Vinegar - Salad dressings, vegetables, and fish dishes. Spices  Cinnamon - Sweet dishes, such  as cakes, cookies, and puddings.  Cloves - Gingerbread, puddings, and marinades for meats.  Curry - Vegetable dishes, fish and poultry dishes, and stir-fry dishes.  Ginger - Vegetables dishes, fish dishes, and stir-fry dishes.  Nutmeg - Pasta, vegetables, poultry, fish dishes, and custard. What are some low-sodium ingredients and foods?  Fresh or frozen fruits and vegetables with no sauce added.  Fresh or frozen whole meats, poultry, and fish with no sauce added.  Eggs.  Noodles, pasta, quinoa, rice.  Shredded or puffed wheat or puffed rice.  Regular or quick oats.  Milk, yogurt, hard cheeses, and low-sodium cheeses. Good cheese choices include Swiss, [place]. Always check the label for the serving size and sodium content.  Unsalted butter or margarine.  Unsalted nuts.  Sherbet or ice cream (keep to  cup per serving).  Homemade pudding.  Sodium-free baking soda and baking powder. This is not a complete list of low-sodium ingredients and foods. Contact your dietitian for more options. Summary  Cooking with less salt is one way to reduce the amount of sodium that you get from food.  Buy sodium-free or low-sodium products.  Check the food label before using or buying packaged ingredients.  Use herbs, seasonings without salt, and spices as substitutes for salt in foods. This information is not intended to replace advice given to you by your health care provider. Make sure you discuss any questions you have with your health care provider. Document Revised: 11/19/2017 Document Reviewed: 12/15/2016 Elsevier Patient Education  [place]. Two Gram Sodium Diet 2000 mg  What is Sodium? Sodium is a mineral found naturally in many foods. The most significant source of sodium in the diet is table salt, which is about 40% sodium.  Processed, convenience, and preserved foods also contain a large amount of sodium.  The body needs only 500 mg of sodium  daily to function,  A normal diet provides more than enough sodium even if you do not use salt.  Why Limit Sodium? A build up of sodium in the body can cause thirst, increased blood pressure, shortness of breath, and water retention.  Decreasing sodium in the diet can reduce edema and risk of heart attack or stroke associated with high blood pressure.  Keep in mind that there are many other factors involved in these health problems.  Heredity, obesity, lack of exercise, cigarette smoking, stress and what you eat all play a role.  General Guidelines:  Do not add salt at the table or in cooking.  One teaspoon of salt contains over 2 grams of sodium.  Read food labels  Avoid processed and convenience foods  Ask your dietitian before eating any foods not dicussed in the menu planning guidelines  Consult your physician if you wish to use a salt substitute or a sodium containing medication such as antacids.  Limit milk and milk products to 16 oz (2 cups) per day.  Shopping Hints:  READ LABELS!! "Dietetic" does not necessarily mean low sodium.  Salt and other sodium ingredients are often added to foods during processing.   Menu Planning Guidelines Food Group Choose More Often Avoid  Beverages (see also the milk group All fruit juices, low-sodium, salt-free vegetables juices, low-sodium carbonated beverages Regular vegetable or tomato juices, commercially softened water used for drinking or cooking  Breads and Cereals Enriched white, wheat, rye and pumpernickel bread, hard rolls and dinner rolls; muffins, cornbread and waffles; most dry cereals, cooked cereal without added salt; unsalted crackers and breadsticks; low sodium or homemade bread crumbs Bread, rolls and crackers with salted tops; quick breads; instant hot cereals; pancakes; commercial bread stuffing; self-rising flower and biscuit mixes; regular bread crumbs or cracker crumbs  Desserts and Sweets Desserts and sweets mad with mild  should be within allowance Instant pudding mixes and cake mixes  Fats Butter or margarine; vegetable oils; unsalted salad dressings, regular salad dressings limited to 1 Tbs; light, sour and heavy cream Regular salad dressings containing bacon fat, bacon bits, and salt pork; snack dips made with instant soup mixes or processed cheese; salted nuts  Fruits Most fresh, frozen and canned fruits Fruits processed with salt or sodium-containing ingredient (some dried fruits are processed with sodium sulfites        Vegetables Fresh, frozen vegetables and low- sodium canned vegetables Regular canned vegetables, sauerkraut, pickled vegetables, and others prepared in brine; frozen vegetables in sauces; vegetables seasoned with ham, bacon or salt pork  Condiments, Sauces, Miscellaneous  Salt substitute with physician's approval; pepper, herbs, spices; vinegar, lemon or lime juice; hot pepper sauce; garlic powder, onion powder, low sodium soy sauce (1 Tbs.); low sodium condiments (ketchup, chili sauce, mustard) in limited amounts (1 tsp.) fresh ground horseradish; unsalted tortilla chips, pretzels, potato chips, popcorn, salsa (1/4 cup) Any seasoning made with salt including garlic salt, celery salt, onion salt, and seasoned salt; sea salt, rock salt, kosher salt; meat tenderizers; monosodium glutamate; mustard, regular soy sauce, barbecue, sauce, chili sauce, teriyaki sauce, steak sauce, Worcestershire sauce, and most flavored vinegars; canned gravy and mixes; regular condiments; salted snack foods, olives, picles, relish, horseradish sauce, catsup   Food preparation: Try these seasonings Meats:    Pork Sage, onion Serve with applesauce  Chicken Poultry seasoning, thyme, parsley Serve with cranberry sauce  Lamb Curry powder, rosemary, garlic, thyme Serve with mint sauce or jelly  Veal Marjoram, basil Serve with current jelly, cranberry sauce  Beef Pepper, bay leaf Serve with dry mustard, unsalted chive  butter  Fish Bay leaf, dill Serve with unsalted lemon butter, unsalted parsley butter  Vegetables:    Asparagus Lemon juice   Broccoli Lemon juice   Carrots Mustard dressing parsley, mint, nutmeg, glazed with unsalted butter and sugar   Green beans Marjoram, lemon juice, nutmeg,dill seed   Tomatoes Basil, marjoram, onion   Spice /blend for [employer]" 4 tsp ground thyme 1 tsp ground sage 3 tsp ground rosemary 4 tsp ground marjoram   Test your knowledge 8.  A product that says "Salt Free" may still contain sodium. True or False 9. Garlic Powder and Hot Pepper Sauce an be used as alternative seasonings.True or False 10. Processed foods have more sodium than fresh foods.  True or False 11. Canned Vegetables have less sodium than froze True or False  WAYS TO DECREASE YOUR SODIUM INTAKE 3. Avoid the use of added salt in cooking and at the table.  Table salt (and other prepared seasonings which contain salt) is probably one of the greatest sources of sodium in the diet.  Unsalted foods can gain flavor from the sweet, sour, and butter taste sensations of herbs and spices.  Instead of using salt for seasoning, try the following seasonings with the foods listed.  Remember: how you use them to enhance natural food flavors is limited only by your creativity... Allspice-Meat, fish, eggs, fruit, peas, red and yellow vegetables Almond Extract-Fruit baked goods Anise Seed-Sweet breads, fruit, carrots, beets, cottage cheese, cookies (tastes like licorice) Basil-Meat, fish, eggs, vegetables, rice, vegetables salads, soups, sauces Bay Leaf-Meat, fish, stews, poultry Burnet-Salad, vegetables (cucumber-like flavor) Caraway Seed-Bread, cookies, cottage cheese, meat, vegetables, cheese, rice Cardamon-Baked goods, fruit, soups Celery Powder or seed-Salads, salad dressings, sauces, meatloaf, soup, bread.Do not use  celery salt Chervil-Meats, salads, fish, eggs, vegetables, cottage cheese (parsley-like  flavor) Chili Power-Meatloaf, chicken cheese, corn, eggplant, egg dishes Chives-Salads cottage cheese, egg dishes, soups, vegetables, sauces Cilantro-Salsa, casseroles Cinnamon-Baked goods, fruit, pork, lamb, chicken, carrots Cloves-Fruit, baked goods, fish, pot roast, green beans, beets, carrots Coriander-Pastry, cookies, meat, salads, cheese (lemon-orange flavor) Cumin-Meatloaf, fish,cheese, eggs, cabbage,fruit pie (caraway flavor) [employer], fruit, eggs, fish, poultry, cottage cheese, vegetables Dill Seed-Meat, cottage cheese, poultry, vegetables, fish, salads, bread Fennel Seed-Bread, cookies, apples, pork, eggs, fish, beets, cabbage, cheese, Licorice-like flavor Garlic-(buds or powder) Salads, meat, poultry, fish, bread, butter, vegetables, potatoes.Do not  use garlic salt Ginger-Fruit, vegetables, baked goods, meat, fish, poultry Horseradish Root-Meet, vegetables, butter Lemon Juice or Extract-Vegetables, fruit, tea, baked goods, fish salads Mace-Baked goods fruit, vegetables, fish, poultry (taste like nutmeg) Maple Extract-Syrups Marjoram-Meat, chicken, fish, vegetables, breads, green salads (taste like Sage) Mint-Tea, lamb, sherbet, vegetables, desserts, carrots, cabbage Mustard, Dry or Seed-Cheese, eggs, meats, vegetables, poultry Nutmeg-Baked goods, fruit, chicken, eggs, vegetables, desserts Onion Powder-Meat, fish, poultry, vegetables, cheese, eggs, bread, rice salads (Do not use   Onion salt) Orange Extract-Desserts, baked goods Oregano-Pasta, eggs, cheese, onions, pork, lamb, fish, chicken, vegetables, green salads Paprika-Meat, fish, poultry, eggs, cheese, vegetables Parsley Flakes-Butter, vegetables, meat fish, poultry, eggs, bread, salads (certain forms may   Contain sodium Pepper-Meat fish, poultry, vegetables, eggs Peppermint Extract-Desserts, baked goods Poppy Seed-Eggs, bread, cheese, fruit dressings, baked goods, noodles, vegetables, cottage  [employer], poultry, meat, fish, cauliflower, turnips,eggs bread Saffron-Rice, bread, veal, chicken, fish, eggs Sage-Meat, fish, poultry, onions, eggplant, tomateos, pork, stews Savory-Eggs, salads, poultry, meat, rice, vegetables, soups, pork Tarragon-Meat, poultry, fish, eggs, butter, vegetables (licorice-like flavor)  Thyme-Meat, poultry, fish, eggs, vegetables, (clover-like flavor), sauces, soups Tumeric-Salads, butter, eggs, fish, rice, vegetables (saffron-like flavor) Vanilla Extract-Baked goods, candy Vinegar-Salads, vegetables, meat marinades Walnut Extract-baked goods, candy  2. Choose your Foods Wisely   The following is a list of foods to avoid which are high in sodium:  Meats-Avoid all smoked, canned, salt cured, dried and kosher meat and fish as well as Anchovies   [employer] meats:Bologna, Liverwurst, Pastrami Canned meat or fish  Marinated herring Caviar    Pepperoni Corned Beef   Pizza Dried chipped beef  Salami Frozen breaded fish or meat Salt pork Frankfurters or hot dogs  Sardines Gefilte fish   Sausage Ham (boiled ham, Proscuitto Smoked butt    spiced ham)   Spam      TV Dinners Vegetables Canned vegetables (Regular) Relish Canned mushrooms  Sauerkraut Olives    Tomato juice Pickles  Bakery and Dessert Products Canned puddings  Cream pies Cheesecake   Decorated cakes Cookies  Beverages/Juices Tomato juice, regular  Gatorade   V-8 vegetable juice, regular  Breads and Cereals Biscuit mixes   Salted potato chips, corn chips, pretzels Bread stuffing mixes  Salted crackers and rolls Pancake and waffle mixes Self-rising flour  Seasonings Accent    Meat sauces Barbecue sauce  Meat tenderizer Catsup    Monosodium glutamate (MSG) Celery salt   Onion salt Chili sauce   Prepared mustard Garlic salt   Salt, seasoned salt, sea salt Gravy mixes   Soy sauce Horseradish   Steak sauce Ketchup   Tartar sauce Lite  salt    Teriyaki sauce Marinade mixes   Worcestershire sauce  Others Baking powder   Cocoa and cocoa mixes Baking soda   Commercial casserole mixes Candy-caramels, chocolate  Dehydrated soups    Bars, fudge,nougats  Instant rice and pasta mixes Canned broth or soup  Maraschino cherries Cheese, aged and processed cheese and cheese spreads  Learning Assessment Quiz  Indicated T (for True) or F (for False) for each of the following statements:  6. _____ Fresh fruits and vegetables and unprocessed grains are generally low in sodium 7. _____ Water may contain a considerable amount of sodium, depending on the source 8. _____ You can always tell if a food is high in sodium by tasting it 9. _____ Certain laxatives my be high in sodium and should be avoided unless prescribed   by a physician or pharmacist 10. _____ Salt substitutes may be used freely by anyone on a sodium restricted diet 11. _____ Sodium is present in table salt, food additives and as a natural component of   most foods 12. _____ Table salt is approximately 90% sodium 13. _____ Limiting sodium intake may help prevent excess fluid accumulation in the body 14. _____ On a sodium-restricted diet, seasonings such as bouillon soy sauce, and    cooking wine should be used in place of table salt 15. _____ On an ingredient list, a product which lists monosodium glutamate as the first   ingredient is an appropriate food to include on a low sodium diet  Circle the best answer(s) to the following statements (Hint: there may be more than one correct answer)  11. On a low-sodium diet, some acceptable snack items are:    A. Olives  F. Bean dip   K. Grapefruit juice    B. Salted Pretzels G. Commercial Popcorn   L. Canned peaches    C. Carrot Sticks  H. Bouillon   M. Unsalted nuts   D. [place] fries  I. Peanut butter crackers N. Salami   E. Sweet pickles J. Tomato Juice   O. Pizza  12.  Seasonings that may be used freely on a reduced -  sodium diet include   A. Lemon wedges F.Monosodium glutamate K. Celery seed    B.Soysauce   G. Pepper   L. Mustard powder   C. Sea salt  H. Cooking wine  M. Onion flakes   D. Vinegar  E. Prepared horseradish N. Salsa   E. Sage   J. Worcestershire sauce  O. Chutney

## 2020-09-04 DIAGNOSIS — I129 Hypertensive chronic kidney disease with stage 1 through stage 4 chronic kidney disease, or unspecified chronic kidney disease: Secondary | ICD-10-CM | POA: Diagnosis not present

## 2020-09-04 DIAGNOSIS — D631 Anemia in chronic kidney disease: Secondary | ICD-10-CM | POA: Diagnosis not present

## 2020-09-04 DIAGNOSIS — N1831 Chronic kidney disease, stage 3a: Secondary | ICD-10-CM | POA: Diagnosis not present

## 2020-09-05 ENCOUNTER — Other Ambulatory Visit: Payer: Self-pay

## 2020-09-05 ENCOUNTER — Ambulatory Visit
Admission: RE | Admit: 2020-09-05 | Discharge: 2020-09-05 | Disposition: A | Discharge status: Home / Self Care | Payer: Medicare HMO | Source: Ambulatory Visit | Attending: Nurse Practitioner | Admitting: Nurse Practitioner

## 2020-09-05 DIAGNOSIS — Z1231 Encounter for screening mammogram for malignant neoplasm of breast: Secondary | ICD-10-CM | POA: Diagnosis not present

## 2020-09-05 IMAGING — MG DIGITAL SCREENING UNILAT RIGHT W/ TOMO W/ CAD
4 series · 4 of 12 positions shown · non-contrast
Comparison: Previous exam(s).

CLINICAL DATA: Screening. Status post LEFT mastectomy.

EXAM:
DIGITAL SCREENING UNILATERAL RIGHT MAMMOGRAM WITH CAD AND TOMO

[R CC synth-2D]
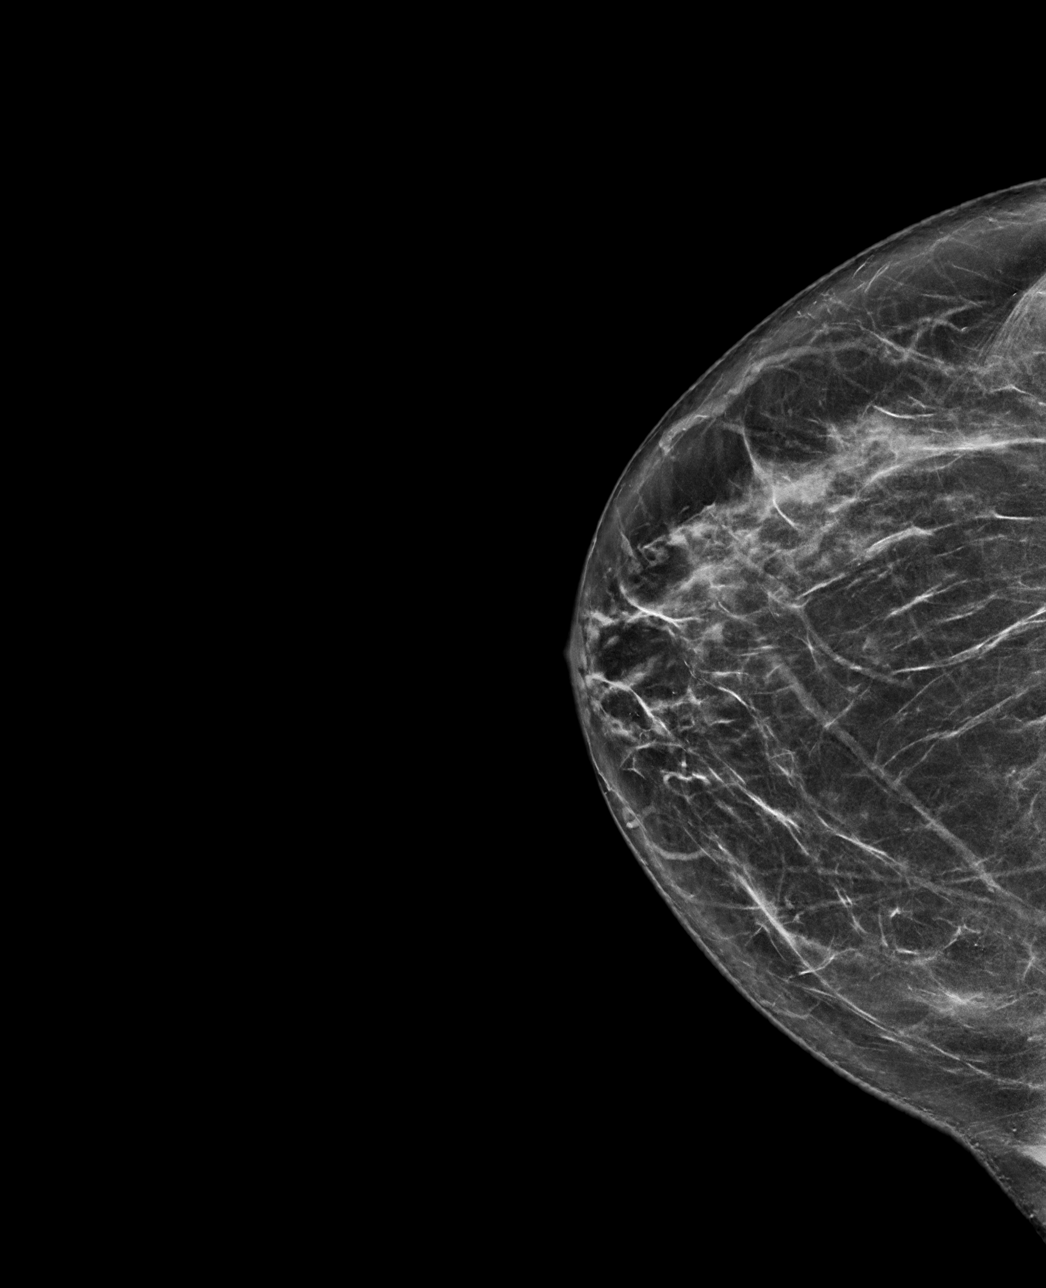

[R MLO synth-2D]
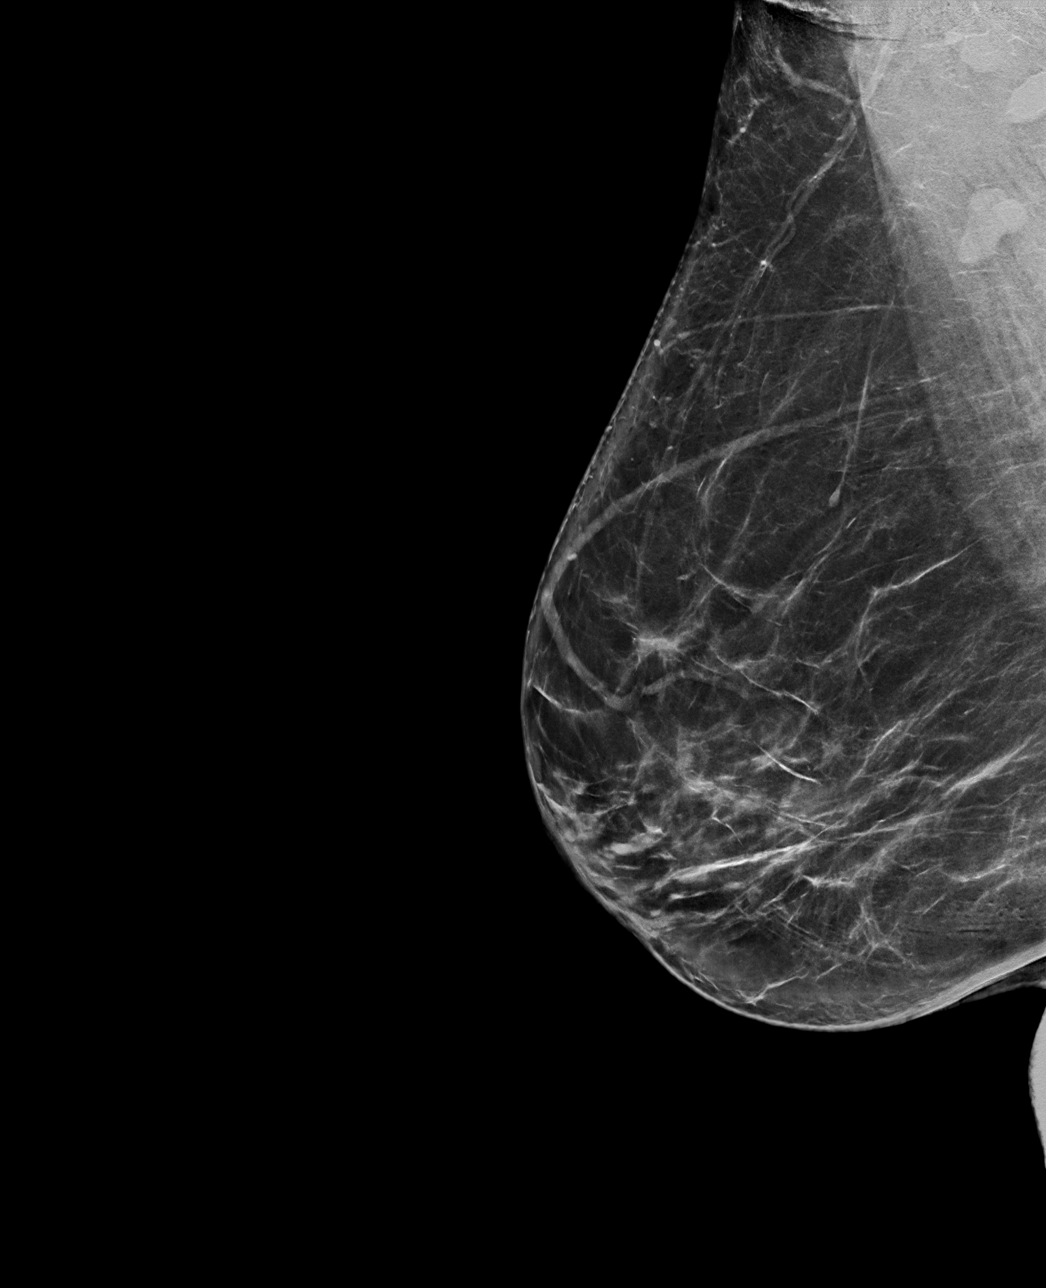

[R MLO tomo · tomo slice 43/85.0]
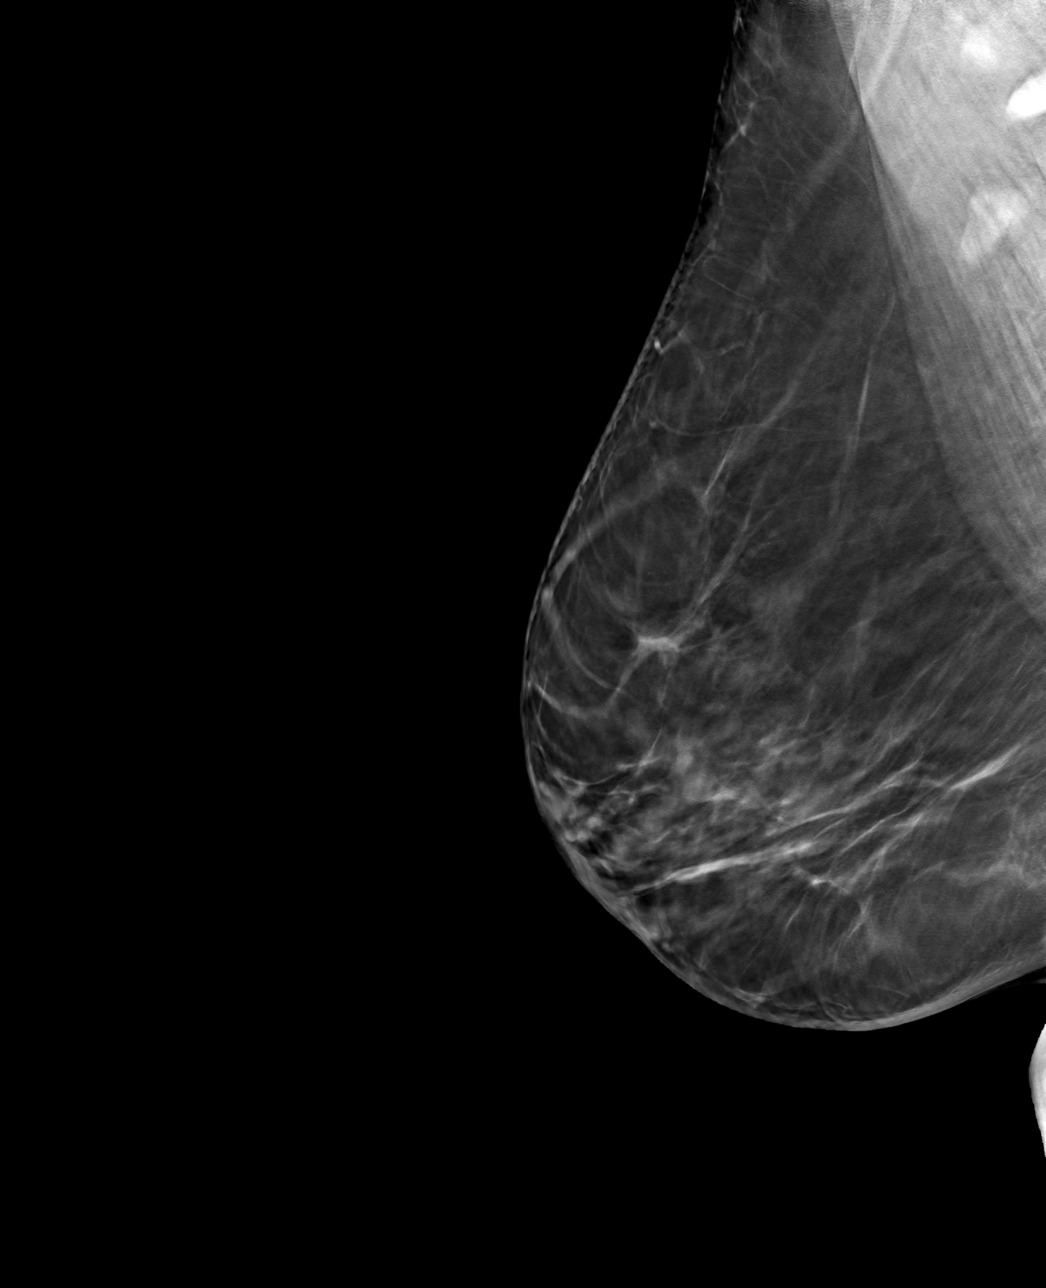

[R CC tomo · tomo slice 37/74.0]
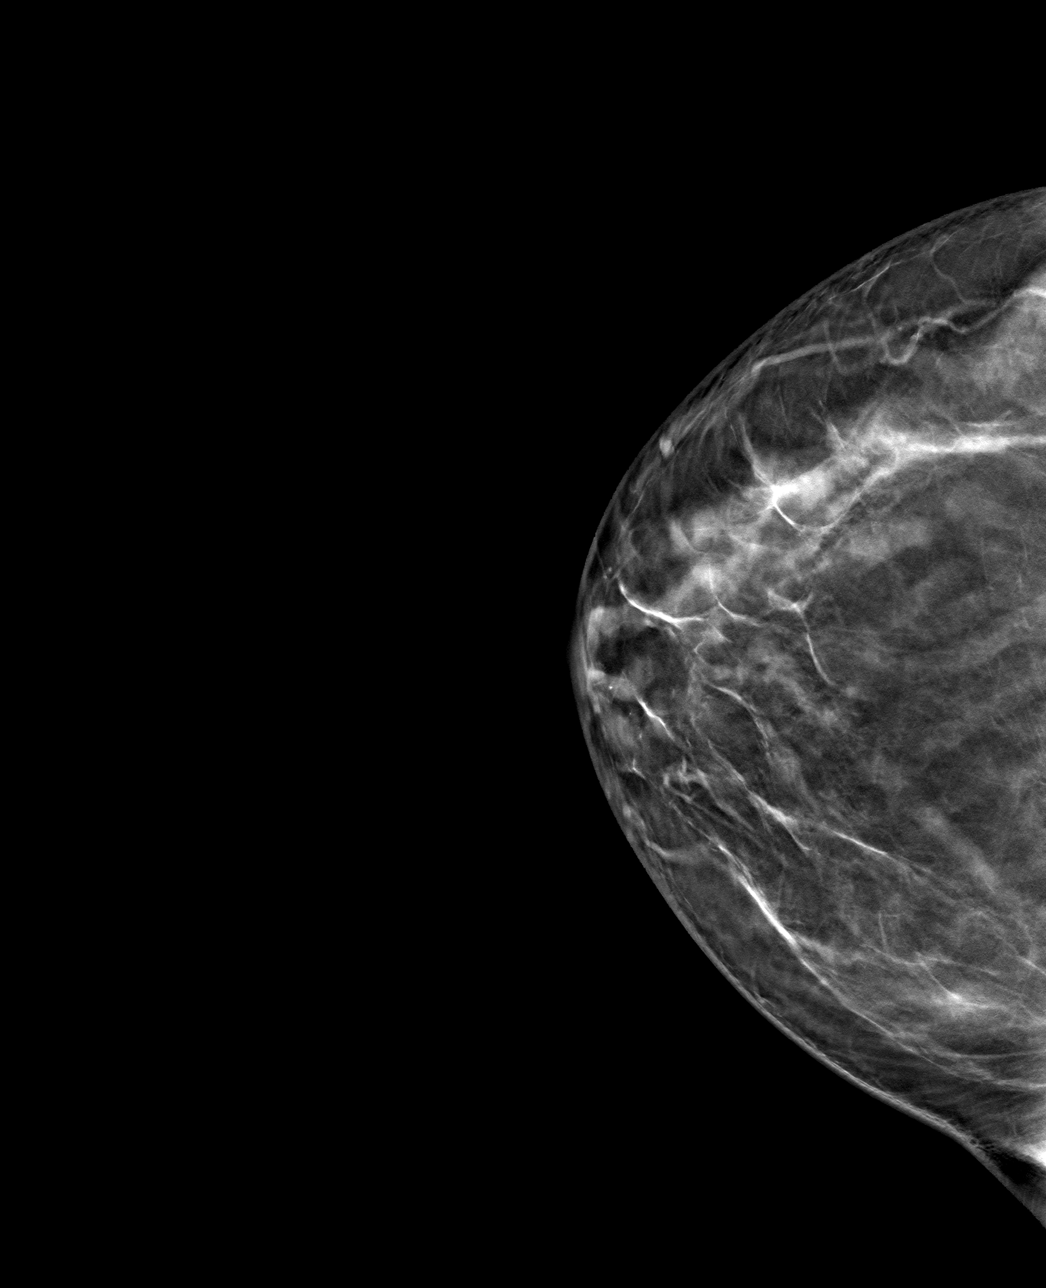

[4 of 12 positions shown; findings below may reference images not displayed]

ACR Breast Density Category b: There are scattered areas of
fibroglandular density.
FINDINGS: There are no findings suspicious for malignancy. Status post LEFT
mastectomy. Images were processed with CAD.
IMPRESSION: No mammographic evidence of malignancy. A result letter of this
screening mammogram will be mailed directly to the patient.

RECOMMENDATION:
Screening mammogram in one year. (Code:[6U])

BI-RADS CATEGORY  1: Negative.

## 2020-09-06 NOTE — Progress Notes (Signed)
Contacted via MyChart  Good morning Avarie, your mammogram was normal.  Have a great day!!

## 2020-09-09 ENCOUNTER — Telehealth: Payer: Medicare HMO

## 2020-09-10 ENCOUNTER — Telehealth: Payer: Self-pay | Admitting: Pharmacist

## 2020-09-10 ENCOUNTER — Ambulatory Visit: Payer: Self-pay | Admitting: General Practice

## 2020-09-10 DIAGNOSIS — I10 Essential (primary) hypertension: Secondary | ICD-10-CM | POA: Diagnosis not present

## 2020-09-10 DIAGNOSIS — D509 Iron deficiency anemia, unspecified: Secondary | ICD-10-CM

## 2020-09-10 DIAGNOSIS — N1832 Chronic kidney disease, stage 3b: Secondary | ICD-10-CM

## 2020-09-10 DIAGNOSIS — E7800 Pure hypercholesterolemia, unspecified: Secondary | ICD-10-CM

## 2020-09-10 NOTE — Chronic Care Management (AMB) (Signed)
Chronic Care Management   Follow Up Note   09/10/2020 Name: Kelly Poole MRN: [id number] DOB: 1951/07/19  Referred by: Venita Lick, NP Reason for referral : Chronic Care Management (RNCM- follow up from VM that the patient left on VM. Chronic Disease Managment and Care coordination Needs)   Kelly Poole is a 69 y.o. year old female who is a primary care patient of Cannady, Barbaraann Faster, NP. The CCM team was consulted for assistance with chronic disease management and care coordination needs.    Review of patient status, including review of consultants reports, relevant laboratory and other test results, and collaboration with appropriate care team members and the patient's provider was performed as part of comprehensive patient evaluation and provision of chronic care management services.    SDOH (Social Determinants of Health) assessments performed: Yes See Care Plan activities for detailed interventions related to [facility])     Outpatient Encounter Medications as of 09/10/2020  Medication Sig Note  . Ascorbic Acid (VITAMIN C) 1000 MG tablet Take 1,000 mg by mouth daily.   Marland Kitchen aspirin 81 MG tablet Take 81 mg by mouth daily.   Marland Kitchen atorvastatin (LIPITOR) 10 MG tablet Take 1 tablet (10 mg total) by mouth daily.   . Cetirizine HCl (ALLERGY RELIEF) 10 MG CAPS Take by mouth.   . ferrous sulfate 325 (65 FE) MG tablet Take 1 tablet (325 mg total) by mouth daily with breakfast.   . Garlic 4259 MG CAPS Take 1,000 mg by mouth daily. 10/12/2019: Doesn't take regular  . hydrochlorothiazide (HYDRODIURIL) 25 MG tablet Take 1 tablet (25 mg total) by mouth daily.   Marland Kitchen ketoconazole (NIZORAL) 2 % cream Apply 1 application topically daily. (Patient not taking: Reported on 07/30/2020)   . losartan (COZAAR) 100 MG tablet Take 1 tablet (100 mg total) by mouth daily.   . Multiple Vitamin (MULTIVITAMINS PO) Take by mouth daily.   . mupirocin ointment (BACTROBAN) 2 % Apply 1 application topically 2 (two) times  daily.   Marland Kitchen omeprazole (PRILOSEC) 20 MG capsule Take 1 capsule (20 mg total) by mouth daily.   . TURMERIC PO Take 1 capsule by mouth daily.   . vitamin B-12 (CYANOCOBALAMIN) 1000 MCG tablet Take 1 tablet (1,000 mcg total) by mouth daily.   . vitamin E 100 UNIT capsule Take 100 Units by mouth daily.    No facility-administered encounter medications on file as of 09/10/2020.     Objective:   BP Readings from Last 3 Encounters:  07/30/20 114/75  04/22/20 122/82  04/17/20 132/80   Goals Addressed              This Visit's Progress   .  RNCM-Pt-"I take my blood pressure but it is the wrist cuff" (pt-stated)        CARE PLAN ENTRY (see longtitudinal plan of care for additional care plan information)  Current Barriers:  . Chronic Disease Management support, education, and care coordination needs related to HTN, HLD, and Chronic arthritis pain (knee)  Clinical Goal(s) related to HTN, HLD, and Chronic arthritis pain (knee):  Over the next 120 days, patient will:  . Work with the care management team to address educational, disease management, and care coordination needs  . Begin or continue self health monitoring activities as directed today Measure and record blood pressure 2 times per week and adhere to a heart healthy/renal diet  . Call provider office for new or worsened signs and symptoms Blood pressure findings outside established parameters  and New or worsened symptom related to HLD, chronic arthritis pain and other chronic conditions. . Call care management team with questions or concerns . Verbalize basic understanding of patient centered plan of care established today  Interventions related to HTN, HLD, and Chronic arthritis pain (knee):  Marland Kitchen Evaluation of current treatment plans and patient's adherence to plan as established by provider.  The patient is compliant with the plan of care. Saw pcp recently.  . Assessed patient understanding of disease states.  The patient has a good  understanding of her conditions. 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 Talked about taking care of self and making time for her to have time for herself.  . Assessed patient's education and care coordination needs. Education given to the patient on the normal range for blood pressures. The patient states she has it pretty well managed right now. The patient has a wrist cuff and admits lots of times that it may be off. Denies headaches when her pressures are high. Education to be sent by MyChart and EMMI.  08-09-2020: the patient states her blood pressure continues to be controlled. She is thankful for this. Education and support given on the benefits of taking blood pressure regularly and recording.  . Provided disease specific education to patient: Educational material to be sent to the patient on dietary restrictions and other requested information from the patient. 08-09-2020: The patient is compliant with Heart Healthy diet. Wants to learn more about renal diet restrictions and protection of kidneys. 08-21-2020: The patient wanted reassurance that she was making the right dietary choices and limiting her sodium. She wants to do everything she can to prevent further damage to her kidneys and heart. 09-10-2020: The patient wanted information on Tylenol. Information provided to the patient. She ask about increasing her iron medications but advised the patient to talk to the pcp before taking any new medications or more than prescribed. Gave the patient alternative methods of helping with building up of iron.  Nash Dimmer with appropriate clinical care team members regarding patient needs.  Will ask the CCM pharmacist to assist with medication review and medications that may be nephrotoxic. The patient is eager to learn what she can to protect her health and well being.   Patient Self Care Activities related toHTN, HLD, and Chronic arthritis pain (knee):  Marland Kitchen Patient is unable to independently  self-manage chronic health conditions  Please see past updates related to this goal by clicking on the "Past Updates" button in the selected goal      .  RNCM: Pt-"I am really concerned about my kidneys" (pt-stated)        CARE PLAN ENTRY (see longitudinal plan of care for additional care plan information)  Current Barriers:  Marland Kitchen Knowledge Deficits related to kidney function and medication of hydrochlorothiazide decreasing her kidney function . Care Coordination needs related to educational needs in a patient with CKD III (disease states) . Chronic Disease Management support and education needs related to renal function and abnormal labs related to medications and the patient wanting to understand her conditions better  Nurse Case Manager Clinical Goal(s):  Marland Kitchen Over the next 120 days, patient will verbalize understanding of plan for CKDIII . Over the next 120 days, patient will work with [facility] [place] team and pcp to address needs related to kidney function and understanding CKD III . Over the next 120 days, patient will attend all scheduled medical appointments: pcp follow up 07/30/2020 with lab work follow  up on 05/16/2020 for repeat labs related to kidney function . Over the next 120 days, patient will verbalize basic understanding of CKDIII disease process and self health management plan as evidenced by understanding kidney function, how life style changes and diet can help in maintaining healthy kidney function. . Over the next 120 days, patient will demonstrate understanding of rationale for each prescribed medication as evidenced by compliance and review of medications with RNCM and pharmacist  . Over the next 120 days, patient will work with CM team pharmacist to understand BETA Blockers better and help with questions she has related to the medications she takes.   Interventions:  . Inter-disciplinary care team collaboration (see longitudinal plan of care) . Evaluation of current treatment plan  related to CKDIII and patient's adherence to plan as established by provider. The patient is concerned about her kidney function and wants to know all she can so she can make sure she is doing her part keep her kidneys from delcining further. Education and support given. Sees kidney specialist in September.  . Advised patient to follow recommendations of pcp with decreasing the dose of Hydrochlorothiazide and having follow up labs on 05/16/2020.  08-09-2020: the patient saw the pcp recently and had labwork done. The patient states that she is eager to talk to the specialist. Review of what can cause renal function decline. The patient given resources. 09-10-2020: The patient saw the specialist and he told her to drink lemon and lime juices and water.  The patient provided education on flavored waters and also juice information. . Provided education to patient re: renal function, CKDIII, and dietary restrictions for renal diet.  08-09-2020: The patient wants to learn all she can about improving her dietary habits and eating things that are good for her. Agrees to get information by [facility] and my chart. The patient called back briefly and ask about pomegranate juice for kidney health. Reviewed information with the patient. 08-21-2020: The patient had called and left a VM asking for a call back. The patient called back. She is concerned about cramps if she eats no salt in her diet. Education and support given. Explained 1 teaspoon of salt is equal to 2300 mg of sodium therefore she would need to use a little less than a teaspoon of salt in a days time. She states she does not use this much at all. She does use other herbs, spices and a seasoning called "complete seasoning".  The patient wants to make sure she is doing her part to not harm her kidneys any further. She also is drinking pomegranate juice and she says she is having good urinary habits. Praised the patient for good habits and changing her lifestyle habits to  improve her health and well being. 09-10-2020: the patient was asking about juices to drink that would be good for her and things that would help with her iron level. The patient instructed to eat beets and leafy greens, and gave her other food options that will promote increased iron levels. . Reviewed medications with patient and discussed compliance. The patient ask question about the Hydrochlorothiazide and if decreasing the dose what does a BETA blocker mean?  Education provided and will do a pharmacy referral.  08-09-2020: Will ask for pharmacist to reach out to the patient to discuss medications that may be nephrotoxic.  Marland Kitchen Discussed plans with patient for ongoing care management follow up and provided patient with direct contact information for care management team . Pharmacy referral for  education and support on possibly having the start taking a BETA blocker if the reduced dose of hydrochlorothiazide does not work.  Also for medications that may be nephrotoxic . Evaluation of activity level. The patient is going to the gym and working out. She is being proactive in her care. Praised for accomplishments.   Patient Self Care Activities:  . Patient verbalizes understanding of plan to work with CCM team to help her meet her health and wellness needs . Self administers medications as prescribed . Attends all scheduled provider appointments . Calls provider office for new concerns or questions . Unable to independently manage CKDIII  Please see past updates related to this goal by clicking on the "Past Updates" button in the selected goal          Plan:   The care management team will reach out to the patient again over the next 30 to 60 days.    Noreene Larsson RN, MSN, [facility] Mobile: [phone]

## 2020-09-10 NOTE — Progress Notes (Signed)
Patients appointment  was rescheduled from 09/09/20 to 10/16/20 at [place] [facility] Pharmacist Assistant [phone]

## 2020-09-10 NOTE — Patient Instructions (Signed)
Visit Information  Goals Addressed              This Visit's Progress   .  RNCM-Pt-"I take my blood pressure but it is the wrist cuff" (pt-stated)        CARE PLAN ENTRY (see longtitudinal plan of care for additional care plan information)  Current Barriers:  . Chronic Disease Management support, education, and care coordination needs related to HTN, HLD, and Chronic arthritis pain (knee)  Clinical Goal(s) related to HTN, HLD, and Chronic arthritis pain (knee):  Over the next 120 days, patient will:  . Work with the care management team to address educational, disease management, and care coordination needs  . Begin or continue self health monitoring activities as directed today Measure and record blood pressure 2 times per week and adhere to a heart healthy/renal diet  . Call provider office for new or worsened signs and symptoms Blood pressure findings outside established parameters and New or worsened symptom related to HLD, chronic arthritis pain and other chronic conditions. . Call care management team with questions or concerns . Verbalize basic understanding of patient centered plan of care established today  Interventions related to HTN, HLD, and Chronic arthritis pain (knee):  Marland Kitchen Evaluation of current treatment plans and patient's adherence to plan as established by provider.  The patient is compliant with the plan of care. Saw pcp recently.  . Assessed patient understanding of disease states.  The patient has a good understanding of her conditions. 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 Talked about taking care of self and making time for her to have time for herself.  . Assessed patient's education and care coordination needs. Education given to the patient on the normal range for blood pressures. The patient states she has it pretty well managed right now. The patient has a wrist cuff and admits lots of times that it may be off. Denies headaches when her pressures  are high. Education to be sent by MyChart and EMMI.  08-09-2020: the patient states her blood pressure continues to be controlled. She is thankful for this. Education and support given on the benefits of taking blood pressure regularly and recording.  . Provided disease specific education to patient: Educational material to be sent to the patient on dietary restrictions and other requested information from the patient. 08-09-2020: The patient is compliant with Heart Healthy diet. Wants to learn more about renal diet restrictions and protection of kidneys. 08-21-2020: The patient wanted reassurance that she was making the right dietary choices and limiting her sodium. She wants to do everything she can to prevent further damage to her kidneys and heart. 09-10-2020: The patient wanted information on Tylenol. Information provided to the patient. She ask about increasing her iron medications but advised the patient to talk to the pcp before taking any new medications or more than prescribed. Gave the patient alternative methods of helping with building up of iron.  Nash Dimmer with appropriate clinical care team members regarding patient needs.  Will ask the CCM pharmacist to assist with medication review and medications that may be nephrotoxic. The patient is eager to learn what she can to protect her health and well being.   Patient Self Care Activities related toHTN, HLD, and Chronic arthritis pain (knee):  Marland Kitchen Patient is unable to independently self-manage chronic health conditions  Please see past updates related to this goal by clicking on the "Past Updates" button in the selected goal      .  RNCM: Pt-"I am really concerned about my kidneys" (pt-stated)        CARE PLAN ENTRY (see longitudinal plan of care for additional care plan information)  Current Barriers:  Marland Kitchen Knowledge Deficits related to kidney function and medication of hydrochlorothiazide decreasing her kidney function . Care Coordination  needs related to educational needs in a patient with CKD III (disease states) . Chronic Disease Management support and education needs related to renal function and abnormal labs related to medications and the patient wanting to understand her conditions better  Nurse Case Manager Clinical Goal(s):  Marland Kitchen Over the next 120 days, patient will verbalize understanding of plan for CKDIII . Over the next 120 days, patient will work with [facility] [place] team and pcp to address needs related to kidney function and understanding CKD III . Over the next 120 days, patient will attend all scheduled medical appointments: pcp follow up 07/30/2020 with lab work follow up on 05/16/2020 for repeat labs related to kidney function . Over the next 120 days, patient will verbalize basic understanding of CKDIII disease process and self health management plan as evidenced by understanding kidney function, how life style changes and diet can help in maintaining healthy kidney function. . Over the next 120 days, patient will demonstrate understanding of rationale for each prescribed medication as evidenced by compliance and review of medications with RNCM and pharmacist  . Over the next 120 days, patient will work with CM team pharmacist to understand BETA Blockers better and help with questions she has related to the medications she takes.   Interventions:  . Inter-disciplinary care team collaboration (see longitudinal plan of care) . Evaluation of current treatment plan related to CKDIII and patient's adherence to plan as established by provider. The patient is concerned about her kidney function and wants to know all she can so she can make sure she is doing her part keep her kidneys from delcining further. Education and support given. Sees kidney specialist in September.  . Advised patient to follow recommendations of pcp with decreasing the dose of Hydrochlorothiazide and having follow up labs on 05/16/2020.  08-09-2020: the patient  saw the pcp recently and had labwork done. The patient states that she is eager to talk to the specialist. Review of what can cause renal function decline. The patient given resources. 09-10-2020: The patient saw the specialist and he told her to drink lemon and lime juices and water.  The patient provided education on flavored waters and also juice information. . Provided education to patient re: renal function, CKDIII, and dietary restrictions for renal diet.  08-09-2020: The patient wants to learn all she can about improving her dietary habits and eating things that are good for her. Agrees to get information by [facility] and my chart. The patient called back briefly and ask about pomegranate juice for kidney health. Reviewed information with the patient. 08-21-2020: The patient had called and left a VM asking for a call back. The patient called back. She is concerned about cramps if she eats no salt in her diet. Education and support given. Explained 1 teaspoon of salt is equal to 2300 mg of sodium therefore she would need to use a little less than a teaspoon of salt in a days time. She states she does not use this much at all. She does use other herbs, spices and a seasoning called "complete seasoning".  The patient wants to make sure she is doing her part to not harm her kidneys any further.  She also is drinking pomegranate juice and she says she is having good urinary habits. Praised the patient for good habits and changing her lifestyle habits to improve her health and well being. 09-10-2020: the patient was asking about juices to drink that would be good for her and things that would help with her iron level. The patient instructed to eat beets and leafy greens, and gave her other food options that will promote increased iron levels. . Reviewed medications with patient and discussed compliance. The patient ask question about the Hydrochlorothiazide and if decreasing the dose what does a BETA blocker mean?   Education provided and will do a pharmacy referral.  08-09-2020: Will ask for pharmacist to reach out to the patient to discuss medications that may be nephrotoxic.  Marland Kitchen Discussed plans with patient for ongoing care management follow up and provided patient with direct contact information for care management team . Pharmacy referral for education and support on possibly having the start taking a BETA blocker if the reduced dose of hydrochlorothiazide does not work.  Also for medications that may be nephrotoxic . Evaluation of activity level. The patient is going to the gym and working out. She is being proactive in her care. Praised for accomplishments.   Patient Self Care Activities:  . Patient verbalizes understanding of plan to work with CCM team to help her meet her health and wellness needs . Self administers medications as prescribed . Attends all scheduled provider appointments . Calls provider office for new concerns or questions . Unable to independently manage CKDIII  Please see past updates related to this goal by clicking on the "Past Updates" button in the selected goal         Patient verbalizes understanding of instructions provided today.   The care management team will reach out to the patient again over the next 30 to 60 days.  Telephone follow up appointment with care management team member scheduled for:  Noreene Larsson RN, MSN, [facility] Mobile: [phone]

## 2020-10-04 ENCOUNTER — Telehealth: Payer: Medicare HMO | Admitting: General Practice

## 2020-10-04 ENCOUNTER — Ambulatory Visit (INDEPENDENT_AMBULATORY_CARE_PROVIDER_SITE_OTHER): Payer: Medicare HMO | Admitting: General Practice

## 2020-10-04 DIAGNOSIS — E7800 Pure hypercholesterolemia, unspecified: Secondary | ICD-10-CM

## 2020-10-04 DIAGNOSIS — I10 Essential (primary) hypertension: Secondary | ICD-10-CM | POA: Diagnosis not present

## 2020-10-04 DIAGNOSIS — D509 Iron deficiency anemia, unspecified: Secondary | ICD-10-CM | POA: Diagnosis not present

## 2020-10-04 DIAGNOSIS — N1832 Chronic kidney disease, stage 3b: Secondary | ICD-10-CM

## 2020-10-04 NOTE — Chronic Care Management (AMB) (Signed)
Chronic Care Management   Follow Up Note   10/04/2020 Name: COLIN NORMENT MRN: [id number] DOB: 1951/06/29  Referred by: Venita Lick, NP Reason for referral : Chronic Care Management (RNCM Follow up call for Chronic Disease Management and Care Coordination Needs )   Synai SAMAMTHA TIEGS is a 69 y.o. year old female who is a primary care patient of Cannady, Barbaraann Faster, NP. The CCM team was consulted for assistance with chronic disease management and care coordination needs.    Review of patient status, including review of consultants reports, relevant laboratory and other test results, and collaboration with appropriate care team members and the patient's provider was performed as part of comprehensive patient evaluation and provision of chronic care management services.    SDOH (Social Determinants of Health) assessments performed: Yes See Care Plan activities for detailed interventions related to [facility])     Outpatient Encounter Medications as of 10/04/2020  Medication Sig Note  . Ascorbic Acid (VITAMIN C) 1000 MG tablet Take 1,000 mg by mouth daily.   Marland Kitchen aspirin 81 MG tablet Take 81 mg by mouth daily.   Marland Kitchen atorvastatin (LIPITOR) 10 MG tablet Take 1 tablet (10 mg total) by mouth daily.   . Cetirizine HCl (ALLERGY RELIEF) 10 MG CAPS Take by mouth.   . ferrous sulfate 325 (65 FE) MG tablet Take 1 tablet (325 mg total) by mouth daily with breakfast.   . Garlic 0102 MG CAPS Take 1,000 mg by mouth daily. 10/12/2019: Doesn't take regular  . hydrochlorothiazide (HYDRODIURIL) 25 MG tablet Take 1 tablet (25 mg total) by mouth daily.   Marland Kitchen ketoconazole (NIZORAL) 2 % cream Apply 1 application topically daily. (Patient not taking: Reported on 07/30/2020)   . losartan (COZAAR) 100 MG tablet Take 1 tablet (100 mg total) by mouth daily.   . Multiple Vitamin (MULTIVITAMINS PO) Take by mouth daily.   . mupirocin ointment (BACTROBAN) 2 % Apply 1 application topically 2 (two) times daily.   Marland Kitchen omeprazole  (PRILOSEC) 20 MG capsule Take 1 capsule (20 mg total) by mouth daily.   . TURMERIC PO Take 1 capsule by mouth daily.   . vitamin B-12 (CYANOCOBALAMIN) 1000 MCG tablet Take 1 tablet (1,000 mcg total) by mouth daily.   . vitamin E 100 UNIT capsule Take 100 Units by mouth daily.    No facility-administered encounter medications on file as of 10/04/2020.     Objective:  BP Readings from Last 3 Encounters:  09/04/20 132/79  07/30/20 114/75  04/22/20 122/82    Goals Addressed              This Visit's Progress   .  RNCM-Pt-"I take my blood pressure but it is the wrist cuff" (pt-stated)        CARE PLAN ENTRY (see longtitudinal plan of care for additional care plan information)  Current Barriers:  . Chronic Disease Management support, education, and care coordination needs related to HTN, HLD, and Chronic arthritis pain (knee)  Clinical Goal(s) related to HTN, HLD, and Chronic arthritis pain (knee):  Over the next 120 days, patient will:  . Work with the care management team to address educational, disease management, and care coordination needs  . Begin or continue self health monitoring activities as directed today Measure and record blood pressure 2 times per week and adhere to a heart healthy/renal diet  . Call provider office for new or worsened signs and symptoms Blood pressure findings outside established parameters and New or worsened symptom  related to HLD, chronic arthritis pain and other chronic conditions. . Call care management team with questions or concerns . Verbalize basic understanding of patient centered plan of care established today  Interventions related to HTN, HLD, and Chronic arthritis pain (knee):  Marland Kitchen Evaluation of current treatment plans and patient's adherence to plan as established by provider.  The patient is compliant with the plan of care. Saw pcp recently. 10-04-2020: Saw the specialist recently and blood pressure was 132/79 and heart rate 78.  The  patient will follow up with the specialist again on 11-04-2020.   . Assessed patient understanding of disease states.  The patient has a good understanding of her conditions. 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 Talked about taking care of self and making time for her to have time for herself. 10-04-2020: Review of self care and sleep patterns. The patient says she is working on sleep time. The patient has to get up early to go to her mothers house to take over caregiver responsibilities. She lots of times will lay back down and rest. Education on taking care of self and getting adequate amounts of sleep.  . Assessed patient's education and care coordination needs. Education given to the patient on the normal range for blood pressures. The patient states she has it pretty well managed right now. The patient has a wrist cuff and admits lots of times that it may be off. Denies headaches when her pressures are high. Education to be sent by MyChart and EMMI.  10-04-2020: the patient states her blood pressure continues to be controlled. She is thankful for this. Education and support given on the benefits of taking blood pressure regularly and recording.  . Provided disease specific education to patient: Educational material to be sent to the patient on dietary restrictions and other requested information from the patient. 08-09-2020: The patient is compliant with Heart Healthy diet. Wants to learn more about renal diet restrictions and protection of kidneys. 08-21-2020: The patient wanted reassurance that she was making the right dietary choices and limiting her sodium. She wants to do everything she can to prevent further damage to her kidneys and heart. 09-10-2020: The patient wanted information on Tylenol. Information provided to the patient. She ask about increasing her iron medications but advised the patient to talk to the pcp before taking any new medications or more than prescribed. Gave the  patient alternative methods of helping with building up of iron. 10-04-2020: The patient is following a heart healthy renal diet. Education and support given.  Nash Dimmer with appropriate clinical care team members regarding patient needs.  Will ask the CCM pharmacist to assist with medication review and medications that may be nephrotoxic. The patient is eager to learn what she can to protect her health and well being.  . Evaluation of upcoming appointments. The patient sees nephrology on 11-04-2020 and will follow up with the pcp on 02-17-2021.  Will continue to monitor.   Patient Self Care Activities related toHTN, HLD, and Chronic arthritis pain (knee):  Marland Kitchen Patient is unable to independently self-manage chronic health conditions  Please see past updates related to this goal by clicking on the "Past Updates" button in the selected goal      .  RNCM: Pt-"I am really concerned about my kidneys" (pt-stated)        CARE PLAN ENTRY (see longitudinal plan of care for additional care plan information)  Current Barriers:  Marland Kitchen Knowledge Deficits related to kidney function  and medication of hydrochlorothiazide decreasing her kidney function . Care Coordination needs related to educational needs in a patient with CKD III (disease states) . Chronic Disease Management support and education needs related to renal function and abnormal labs related to medications and the patient wanting to understand her conditions better  Nurse Case Manager Clinical Goal(s):  Marland Kitchen Over the next 120 days, patient will verbalize understanding of plan for CKDIII . Over the next 120 days, patient will work with [facility], [place] team and pcp to address needs related to kidney function and understanding CKD III . Over the next 120 days, patient will attend all scheduled medical appointments: pcp follow up 07/30/2020 with lab work follow up on 05/16/2020 for repeat labs related to kidney function . Over the next 120 days, patient will  verbalize basic understanding of CKDIII disease process and self health management plan as evidenced by understanding kidney function, how life style changes and diet can help in maintaining healthy kidney function. . Over the next 120 days, patient will demonstrate understanding of rationale for each prescribed medication as evidenced by compliance and review of medications with RNCM and pharmacist  . Over the next 120 days, patient will work with CM team pharmacist to understand BETA Blockers better and help with questions she has related to the medications she takes.   Interventions:  . Inter-disciplinary care team collaboration (see longitudinal plan of care) . Evaluation of current treatment plan related to CKDIII and patient's adherence to plan as established by provider. The patient is concerned about her kidney function and wants to know all she can so she can make sure she is doing her part keep her kidneys from delcining further. Education and support given. Sees kidney specialist again in November.   . Advised patient to follow recommendations of pcp with decreasing the dose of Hydrochlorothiazide and having follow up labs on 05/16/2020.  08-09-2020: the patient saw the pcp recently and had labwork done. The patient states that she is eager to talk to the specialist. Review of what can cause renal function decline. The patient given resources. 10-04-2020: The patient saw the specialist and he told her to drink lemon and lime juices and water.  The patient provided education on flavored waters and also juice information. The patient got a good report from the kidney specialist. She is watching her diet carefully too. She says her iron level is still low but she continues to take the iron pills.  She thinks a lot of it has to do with the schedule she is on and taking care of her elderly mother.   . Provided education to patient re: renal function, CKDIII, and dietary restrictions for renal diet.   08-09-2020: The patient wants to learn all she can about improving her dietary habits and eating things that are good for her. Agrees to get information by [facility] and my chart. The patient called back briefly and ask about pomegranate juice for kidney health. Reviewed information with the patient. 08-21-2020: The patient had called and left a VM asking for a call back. The patient called back. She is concerned about cramps if she eats no salt in her diet. Education and support given. Explained 1 teaspoon of salt is equal to 2300 mg of sodium therefore she would need to use a little less than a teaspoon of salt in a days time. She states she does not use this much at all. She does use other herbs, spices and a seasoning called "complete  seasoning".  The patient wants to make sure she is doing her part to not harm her kidneys any further. She also is drinking pomegranate juice and she says she is having good urinary habits. Praised the patient for good habits and changing her lifestyle habits to improve her health and well being. 09-10-2020: the patient was asking about juices to drink that would be good for her and things that would help with her iron level. The patient instructed to eat beets and leafy greens, and gave her other food options that will promote increased iron levels. 10-04-2020: the patient is doing well and feels better. Celebrating her 69th birthday soon.  . Reviewed medications with patient and discussed compliance. The patient ask question about the Hydrochlorothiazide and if decreasing the dose what does a BETA blocker mean?  Education provided and will do a pharmacy referral.  08-09-2020: Will ask for pharmacist to reach out to the patient to discuss medications that may be nephrotoxic. 10-04-2020: The patient is compliant with medications. States the new medication pcp put her on makes her go to the bathroom a lot. Education and support given. Will continue to monitor.  . Discussed plans with  patient for ongoing care management follow up and provided patient with direct contact information for care management team . Pharmacy referral for education and support on possibly having the start taking a BETA blocker if the reduced dose of hydrochlorothiazide does not work.  Also for medications that may be nephrotoxic . Evaluation of activity level. The patient is going to the gym and working out. She is being proactive in her care. Praised for accomplishments.   Patient Self Care Activities:  . Patient verbalizes understanding of plan to work with CCM team to help her meet her health and wellness needs . Self administers medications as prescribed . Attends all scheduled provider appointments . Calls provider office for new concerns or questions . Unable to independently manage CKDIII  Please see past updates related to this goal by clicking on the "Past Updates" button in the selected goal          Plan:   Telephone follow up appointment with care management team member scheduled for: 12-06-2020 at 0900 am   Noreene Larsson RN, MSN, [facility] Mobile: [phone]

## 2020-10-04 NOTE — Patient Instructions (Signed)
Visit Information  Goals Addressed              This Visit's Progress   .  RNCM-Pt-"I take my blood pressure but it is the wrist cuff" (pt-stated)        CARE PLAN ENTRY (see longtitudinal plan of care for additional care plan information)  Current Barriers:  . Chronic Disease Management support, education, and care coordination needs related to HTN, HLD, and Chronic arthritis pain (knee)  Clinical Goal(s) related to HTN, HLD, and Chronic arthritis pain (knee):  Over the next 120 days, patient will:  . Work with the care management team to address educational, disease management, and care coordination needs  . Begin or continue self health monitoring activities as directed today Measure and record blood pressure 2 times per week and adhere to a heart healthy/renal diet  . Call provider office for new or worsened signs and symptoms Blood pressure findings outside established parameters and New or worsened symptom related to HLD, chronic arthritis pain and other chronic conditions. . Call care management team with questions or concerns . Verbalize basic understanding of patient centered plan of care established today  Interventions related to HTN, HLD, and Chronic arthritis pain (knee):  Marland Kitchen Evaluation of current treatment plans and patient's adherence to plan as established by provider.  The patient is compliant with the plan of care. Saw pcp recently. 10-04-2020: Saw the specialist recently and blood pressure was 132/79 and heart rate 78.  The patient will follow up with the specialist again on 11-04-2020.   . Assessed patient understanding of disease states.  The patient has a good understanding of her conditions. 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 Talked about taking care of self and making time for her to have time for herself. 10-04-2020: Review of self care and sleep patterns. The patient says she is working on sleep time. The patient has to get up early to go to her  mothers house to take over caregiver responsibilities. She lots of times will lay back down and rest. Education on taking care of self and getting adequate amounts of sleep.  . Assessed patient's education and care coordination needs. Education given to the patient on the normal range for blood pressures. The patient states she has it pretty well managed right now. The patient has a wrist cuff and admits lots of times that it may be off. Denies headaches when her pressures are high. Education to be sent by MyChart and EMMI.  10-04-2020: the patient states her blood pressure continues to be controlled. She is thankful for this. Education and support given on the benefits of taking blood pressure regularly and recording.  . Provided disease specific education to patient: Educational material to be sent to the patient on dietary restrictions and other requested information from the patient. 08-09-2020: The patient is compliant with Heart Healthy diet. Wants to learn more about renal diet restrictions and protection of kidneys. 08-21-2020: The patient wanted reassurance that she was making the right dietary choices and limiting her sodium. She wants to do everything she can to prevent further damage to her kidneys and heart. 09-10-2020: The patient wanted information on Tylenol. Information provided to the patient. She ask about increasing her iron medications but advised the patient to talk to the pcp before taking any new medications or more than prescribed. Gave the patient alternative methods of helping with building up of iron. 10-04-2020: The patient is following a heart healthy renal diet.  Education and support given.  Nash Dimmer with appropriate clinical care team members regarding patient needs.  Will ask the CCM pharmacist to assist with medication review and medications that may be nephrotoxic. The patient is eager to learn what she can to protect her health and well being.  . Evaluation of upcoming  appointments. The patient sees nephrology on 11-04-2020 and will follow up with the pcp on 02-17-2021.  Will continue to monitor.   Patient Self Care Activities related toHTN, HLD, and Chronic arthritis pain (knee):  Marland Kitchen Patient is unable to independently self-manage chronic health conditions  Please see past updates related to this goal by clicking on the "Past Updates" button in the selected goal      .  RNCM: Pt-"I am really concerned about my kidneys" (pt-stated)        CARE PLAN ENTRY (see longitudinal plan of care for additional care plan information)  Current Barriers:  Marland Kitchen Knowledge Deficits related to kidney function and medication of hydrochlorothiazide decreasing her kidney function . Care Coordination needs related to educational needs in a patient with CKD III (disease states) . Chronic Disease Management support and education needs related to renal function and abnormal labs related to medications and the patient wanting to understand her conditions better  Nurse Case Manager Clinical Goal(s):  Marland Kitchen Over the next 120 days, patient will verbalize understanding of plan for CKDIII . Over the next 120 days, patient will work with [facility], [place] team and pcp to address needs related to kidney function and understanding CKD III . Over the next 120 days, patient will attend all scheduled medical appointments: pcp follow up 07/30/2020 with lab work follow up on 05/16/2020 for repeat labs related to kidney function . Over the next 120 days, patient will verbalize basic understanding of CKDIII disease process and self health management plan as evidenced by understanding kidney function, how life style changes and diet can help in maintaining healthy kidney function. . Over the next 120 days, patient will demonstrate understanding of rationale for each prescribed medication as evidenced by compliance and review of medications with RNCM and pharmacist  . Over the next 120 days, patient will work with CM  team pharmacist to understand BETA Blockers better and help with questions she has related to the medications she takes.   Interventions:  . Inter-disciplinary care team collaboration (see longitudinal plan of care) . Evaluation of current treatment plan related to CKDIII and patient's adherence to plan as established by provider. The patient is concerned about her kidney function and wants to know all she can so she can make sure she is doing her part keep her kidneys from delcining further. Education and support given. Sees kidney specialist again in November.   . Advised patient to follow recommendations of pcp with decreasing the dose of Hydrochlorothiazide and having follow up labs on 05/16/2020.  08-09-2020: the patient saw the pcp recently and had labwork done. The patient states that she is eager to talk to the specialist. Review of what can cause renal function decline. The patient given resources. 10-04-2020: The patient saw the specialist and he told her to drink lemon and lime juices and water.  The patient provided education on flavored waters and also juice information. The patient got a good report from the kidney specialist. She is watching her diet carefully too. She says her iron level is still low but she continues to take the iron pills.  She thinks a lot of it has to  do with the schedule she is on and taking care of her elderly mother.   . Provided education to patient re: renal function, CKDIII, and dietary restrictions for renal diet.  08-09-2020: The patient wants to learn all she can about improving her dietary habits and eating things that are good for her. Agrees to get information by [facility] and my chart. The patient called back briefly and ask about pomegranate juice for kidney health. Reviewed information with the patient. 08-21-2020: The patient had called and left a VM asking for a call back. The patient called back. She is concerned about cramps if she eats no salt in her diet.  Education and support given. Explained 1 teaspoon of salt is equal to 2300 mg of sodium therefore she would need to use a little less than a teaspoon of salt in a days time. She states she does not use this much at all. She does use other herbs, spices and a seasoning called "complete seasoning".  The patient wants to make sure she is doing her part to not harm her kidneys any further. She also is drinking pomegranate juice and she says she is having good urinary habits. Praised the patient for good habits and changing her lifestyle habits to improve her health and well being. 09-10-2020: the patient was asking about juices to drink that would be good for her and things that would help with her iron level. The patient instructed to eat beets and leafy greens, and gave her other food options that will promote increased iron levels. 10-04-2020: the patient is doing well and feels better. Celebrating her 69th birthday soon.  . Reviewed medications with patient and discussed compliance. The patient ask question about the Hydrochlorothiazide and if decreasing the dose what does a BETA blocker mean?  Education provided and will do a pharmacy referral.  08-09-2020: Will ask for pharmacist to reach out to the patient to discuss medications that may be nephrotoxic. 10-04-2020: The patient is compliant with medications. States the new medication pcp put her on makes her go to the bathroom a lot. Education and support given. Will continue to monitor.  . Discussed plans with patient for ongoing care management follow up and provided patient with direct contact information for care management team . Pharmacy referral for education and support on possibly having the start taking a BETA blocker if the reduced dose of hydrochlorothiazide does not work.  Also for medications that may be nephrotoxic . Evaluation of activity level. The patient is going to the gym and working out. She is being proactive in her care. Praised for  accomplishments.   Patient Self Care Activities:  . Patient verbalizes understanding of plan to work with CCM team to help her meet her health and wellness needs . Self administers medications as prescribed . Attends all scheduled provider appointments . Calls provider office for new concerns or questions . Unable to independently manage CKDIII  Please see past updates related to this goal by clicking on the "Past Updates" button in the selected goal         Patient verbalizes understanding of instructions provided today.   Telephone follow up appointment with care management team member scheduled for: 12-06-2020 at 0900 am  Noreene Larsson RN, MSN, [facility] Mobile: [phone]

## 2020-10-14 ENCOUNTER — Ambulatory Visit (INDEPENDENT_AMBULATORY_CARE_PROVIDER_SITE_OTHER): Payer: Medicare HMO

## 2020-10-14 VITALS — Ht <= 58 in | Wt 140.0 lb

## 2020-10-14 DIAGNOSIS — Z0000 Encounter for general adult medical examination without abnormal findings: Secondary | ICD-10-CM | POA: Diagnosis not present

## 2020-10-14 NOTE — Patient Instructions (Signed)
Kelly Poole , Thank you for taking time to come for your Medicare Wellness Visit. I appreciate your ongoing commitment to your health goals. Please review the following plan we discussed and let me know if I can assist you in the future.   Screening recommendations/referrals: Colonoscopy: completed 01/20/2013 Mammogram: completed 09/05/2020 Bone Density: due Recommended yearly ophthalmology/optometry visit for glaucoma screening and checkup Recommended yearly dental visit for hygiene and checkup  Vaccinations: Influenza vaccine: decline Pneumococcal vaccine: decline Tdap vaccine: decline Shingles vaccine: discussed   Covid-19: 03/12/2020, 02/12/2020  Advanced directives: Advance directive discussed with you today. .   Conditions/risks identified: none  Next appointment: Follow up in one year for your annual wellness visit    Preventive Care 65 Years and Older, Female Preventive care refers to lifestyle choices and visits with your health care provider that can promote health and wellness. What does preventive care include?  A yearly physical exam. This is also called an annual well check.  Dental exams once or twice a year.  Routine eye exams. Ask your health care provider how often you should have your eyes checked.  Personal lifestyle choices, including:  Daily care of your teeth and gums.  Regular physical activity.  Eating a healthy diet.  Avoiding tobacco and drug use.  Limiting alcohol use.  Practicing safe sex.  Taking low-dose aspirin every day.  Taking vitamin and mineral supplements as recommended by your health care provider. What happens during an annual well check? The services and screenings done by your health care provider during your annual well check will depend on your age, overall health, lifestyle risk factors, and family history of disease. Counseling  Your health care provider may ask you questions about your:  Alcohol use.  Tobacco  use.  Drug use.  Emotional well-being.  Home and relationship well-being.  Sexual activity.  Eating habits.  History of falls.  Memory and ability to understand (cognition).  Work and work Statistician.  Reproductive health. Screening  You may have the following tests or measurements:  Height, weight, and BMI.  Blood pressure.  Lipid and cholesterol levels. These may be checked every 5 years, or more frequently if you are over 23 years old.  Skin check.  Lung cancer screening. You may have this screening every year starting at age 93 if you have a 30-pack-year history of smoking and currently smoke or have quit within the past 15 years.  Fecal occult blood test (FOBT) of the stool. You may have this test every year starting at age 31.  Flexible sigmoidoscopy or colonoscopy. You may have a sigmoidoscopy every 5 years or a colonoscopy every 10 years starting at age 65.  Hepatitis C blood test.  Hepatitis B blood test.  Sexually transmitted disease (STD) testing.  Diabetes screening. This is done by checking your blood sugar (glucose) after you have not eaten for a while (fasting). You may have this done every 1-3 years.  Bone density scan. This is done to screen for osteoporosis. You may have this done starting at age 37.  Mammogram. This may be done every 1-2 years. Talk to your health care provider about how often you should have regular mammograms. Talk with your health care provider about your test results, treatment options, and if necessary, the need for more tests. Vaccines  Your health care provider may recommend certain vaccines, such as:  Influenza vaccine. This is recommended every year.  Tetanus, diphtheria, and acellular pertussis (Tdap, Td) vaccine. You may need a  Td booster every 10 years.  Zoster vaccine. You may need this after age 41.  Pneumococcal 13-valent conjugate (PCV13) vaccine. One dose is recommended after age 78.  Pneumococcal  polysaccharide (PPSV23) vaccine. One dose is recommended after age 81. Talk to your health care provider about which screenings and vaccines you need and how often you need them. This information is not intended to replace advice given to you by your health care provider. Make sure you discuss any questions you have with your health care provider. Document Released: 01/03/2016 Document Revised: 08/26/2016 Document Reviewed: 10/08/2015 Elsevier Interactive Patient Education  2017 [place] Prevention in the Home Falls can cause injuries. They can happen to people of all ages. There are many things you can do to make your home safe and to help prevent falls. What can I do on the outside of my home?  Regularly fix the edges of walkways and driveways and fix any cracks.  Remove anything that might make you trip as you walk through a door, such as a raised step or threshold.  Trim any bushes or trees on the path to your home.  Use bright outdoor lighting.  Clear any walking paths of anything that might make someone trip, such as rocks or tools.  Regularly check to see if handrails are loose or broken. Make sure that both sides of any steps have handrails.  Any raised decks and porches should have guardrails on the edges.  Have any leaves, snow, or ice cleared regularly.  Use sand or salt on walking paths during winter.  Clean up any spills in your garage right away. This includes oil or grease spills. What can I do in the bathroom?  Use night lights.  Install grab bars by the toilet and in the tub and shower. Do not use towel bars as grab bars.  Use non-skid mats or decals in the tub or shower.  If you need to sit down in the shower, use a plastic, non-slip stool.  Keep the floor dry. Clean up any water that spills on the floor as soon as it happens.  Remove soap buildup in the tub or shower regularly.  Attach bath mats securely with double-sided non-slip rug  tape.  Do not have throw rugs and other things on the floor that can make you trip. What can I do in the bedroom?  Use night lights.  Make sure that you have a light by your bed that is easy to reach.  Do not use any sheets or blankets that are too big for your bed. They should not hang down onto the floor.  Have a firm chair that has side arms. You can use this for support while you get dressed.  Do not have throw rugs and other things on the floor that can make you trip. What can I do in the kitchen?  Clean up any spills right away.  Avoid walking on wet floors.  Keep items that you use a lot in easy-to-reach places.  If you need to reach something above you, use a strong step stool that has a grab bar.  Keep electrical cords out of the way.  Do not use floor polish or wax that makes floors slippery. If you must use wax, use non-skid floor wax.  Do not have throw rugs and other things on the floor that can make you trip. What can I do with my stairs?  Do not leave any items on the stairs.  Make sure that there are handrails on both sides of the stairs and use them. Fix handrails that are broken or loose. Make sure that handrails are as long as the stairways.  Check any carpeting to make sure that it is firmly attached to the stairs. Fix any carpet that is loose or worn.  Avoid having throw rugs at the top or bottom of the stairs. If you do have throw rugs, attach them to the floor with carpet tape.  Make sure that you have a light switch at the top of the stairs and the bottom of the stairs. If you do not have them, ask someone to add them for you. What else can I do to help prevent falls?  Wear shoes that:  Do not have high heels.  Have rubber bottoms.  Are comfortable and fit you well.  Are closed at the toe. Do not wear sandals.  If you use a stepladder:  Make sure that it is fully opened. Do not climb a closed stepladder.  Make sure that both sides of the  stepladder are locked into place.  Ask someone to hold it for you, if possible.  Clearly mark and make sure that you can see:  Any grab bars or handrails.  First and last steps.  Where the edge of each step is.  Use tools that help you move around (mobility aids) if they are needed. These include:  Canes.  Walkers.  Scooters.  Crutches.  Turn on the lights when you go into a dark area. Replace any light bulbs as soon as they burn out.  Set up your furniture so you have a clear path. Avoid moving your furniture around.  If any of your floors are uneven, fix them.  If there are any pets around you, be aware of where they are.  Review your medicines with your doctor. Some medicines can make you feel dizzy. This can increase your chance of falling. Ask your doctor what other things that you can do to help prevent falls. This information is not intended to replace advice given to you by your health care provider. Make sure you discuss any questions you have with your health care provider. Document Released: 10/03/2009 Document Revised: 05/14/2016 Document Reviewed: 01/11/2015 Elsevier Interactive Patient Education  2017 [employer].

## 2020-10-14 NOTE — Progress Notes (Signed)
I connected with Natale Barba today by telephone and verified that I am speaking with the correct person using two identifiers. Location patient: home Location provider: work Persons participating in the virtual visit: Gaylyn Cherrish, Vitali LPN.   I discussed the limitations, risks, security and privacy concerns of performing an evaluation and management service by telephone and the availability of in person appointments. I also discussed with the patient that there may be a patient responsible charge related to this service. The patient expressed understanding and verbally consented to this telephonic visit.    Interactive audio and video telecommunications were attempted between this provider and patient, however failed, due to patient having technical difficulties OR patient did not have access to video capability.  We continued and completed visit with audio only.     Vital signs may be patient reported or missing.  Subjective:   Lunell SKYE PLAMONDON is a 69 y.o. female who presents for Medicare Annual (Subsequent) preventive examination.  Review of Systems     Cardiac Risk Factors include: advanced age (>58mn, >>19women);hypertension     Objective:    Today's Vitals   10/14/20 1511  Weight: 140 lb (63.5 kg)  Height: _0  (1.422 m)   Body mass index is 31.39 kg/m.  Advanced Directives 10/14/2020 03/14/2020 10/12/2019  Does Patient Have a Medical Advance Directive? No No No  Would patient like information on creating a medical advance directive? - Yes (MAU/Ambulatory/Procedural Areas - Information given) Yes (MAU/Ambulatory/Procedural Areas - Information given)    Current Medications (verified) Outpatient Encounter Medications as of 10/14/2020  Medication Sig  . Ascorbic Acid (VITAMIN C) 1000 MG tablet Take 1,000 mg by mouth daily.  .Marland Kitchenaspirin 81 MG tablet Take 81 mg by mouth daily.  .Marland Kitchenatorvastatin (LIPITOR) 10 MG tablet Take 1 tablet (10 mg total) by mouth daily.    . Cetirizine HCl (ALLERGY RELIEF) 10 MG CAPS Take by mouth.  . Cholecalciferol (VITAMIN D-3) 125 MCG (5000 UT) TABS Take 1 tablet by mouth daily. Takes every other day  . ferrous sulfate 325 (65 FE) MG tablet Take 1 tablet (325 mg total) by mouth daily with breakfast.  . Garlic 12993MG CAPS Take 1,000 mg by mouth daily.  . hydrochlorothiazide (HYDRODIURIL) 25 MG tablet Take 1 tablet (25 mg total) by mouth daily.  .Marland Kitchenlosartan (COZAAR) 100 MG tablet Take 1 tablet (100 mg total) by mouth daily.  . Multiple Vitamin (MULTIVITAMINS PO) Take by mouth daily.  . mupirocin ointment (BACTROBAN) 2 % Apply 1 application topically 2 (two) times daily.  .Marland Kitchenomeprazole (PRILOSEC) 20 MG capsule Take 1 capsule (20 mg total) by mouth daily.  . TURMERIC PO Take 1 capsule by mouth daily.  . vitamin B-12 (CYANOCOBALAMIN) 1000 MCG tablet Take 1 tablet (1,000 mcg total) by mouth daily.  . vitamin E 100 UNIT capsule Take 100 Units by mouth daily.  .Marland Kitchenketoconazole (NIZORAL) 2 % cream Apply 1 application topically daily. (Patient not taking: Reported on 10/14/2020)   No facility-administered encounter medications on file as of 10/14/2020.    Allergies (verified) Macrobid [nitrofurantoin macrocrystal]   History: Past Medical History:  Diagnosis Date  . [place]   left breast mastectomy. No chemo or rad tx  . GERD (gastroesophageal reflux disease)   . Heart murmur   . High cholesterol   . Hypertension    Past Surgical History:  Procedure Laterality Date  . ABDOMINAL HYSTERECTOMY  1978  . MASTECTOMY Left 1991  Due to breast cancer  . TUBAL LIGATION     Family History  Problem Relation Age of Onset  . Stroke Mother   . Thyroid disease Mother   . Cancer Father   . Diabetes Maternal Aunt   . Breast cancer Other    Social History   Socioeconomic History  . Marital status: Single    Spouse name: Not on file  . Number of children: Not on file  . Years of education: Not on file  .  Highest education level: Not on file  Occupational History  . Not on file  Tobacco Use  . Smoking status: Never Smoker  . Smokeless tobacco: Never Used  Vaping Use  . Vaping Use: Never used  Substance and Sexual Activity  . Alcohol use: No    Alcohol/week: 0.0 standard drinks  . Drug use: No  . Sexual activity: Yes  Other Topics Concern  . Not on file  Social History Narrative  . Not on file   Social Determinants of Health   Financial Resource Strain: Medium Risk  . Difficulty of Paying Living Expenses: Somewhat hard  Food Insecurity: No Food Insecurity  . Worried About Charity fundraiser in the Last Year: Never true  . Ran Out of Food in the Last Year: Never true  Transportation Needs: No Transportation Needs  . Lack of Transportation (Medical): No  . Lack of Transportation (Non-Medical): No  Physical Activity: Sufficiently Active  . Days of Exercise per Week: 3 days  . Minutes of Exercise per Session: 90 min  Stress: No Stress Concern Present  . Feeling of Stress : Not at all  Social Connections: Moderately Integrated  . Frequency of Communication with Friends and Family: More than three times a week  . Frequency of Social Gatherings with Friends and Family: More than three times a week  . Attends Religious Services: More than 4 times per year  . Active Member of Clubs or Organizations: No  . Attends Archivist Meetings: Never  . Marital Status: Married    Tobacco Counseling Counseling given: Not Answered   Clinical Intake:  Pre-visit preparation completed: Yes  Pain : No/denies pain     Nutritional Status: BMI > 30  Obese Nutritional Risks: None Diabetes: No  How often do you need to have someone help you when you read instructions, pamphlets, or other written materials from your doctor or pharmacy?: 1 - Never What is the last grade level you completed in school?: 12th grade  Diabetic? no  Interpreter Needed?: No  Information entered by  :: NAllen LPN   Activities of Daily Living In your present state of health, do you have any difficulty performing the following activities: 10/14/2020 02/16/2020  Hearing? N N  Vision? N N  Difficulty concentrating or making decisions? N N  Walking or climbing stairs? N N  Dressing or bathing? N N  Doing errands, shopping? N N  Preparing Food and eating ? N -  Using the Toilet? N -  In the past six months, have you accidently leaked urine? N -  Do you have problems with loss of bowel control? N -  Managing your Medications? N -  Managing your Finances? N -  Some recent data might be hidden    Patient Care Team: Venita Lick, NP as PCP - General (Nurse Practitioner) Vanita Ingles, RN as Case Manager ([place]) Vladimir Faster, [facility] (Pharmacist)  Indicate any recent Medical Services you [phone]  have received from other than [facility] providers in the past year (date may be approximate).     Assessment:   This is a routine wellness examination for Taunja.  Hearing/Vision screen  Hearing Screening   _0  _1  _2  _3  _4  _5  _6  _7  _8   Right ear:           Left ear:           Vision Screening Comments: No regular eye exams, Dr. Gloriann Loan  Dietary issues and exercise activities discussed: Current Exercise Habits: Home exercise routine, Type of exercise: treadmill, Time (Minutes): > 60, Frequency (Times/Week): 3, Weekly Exercise (Minutes/Week): 0  Goals    .  Patient Stated      10/14/2020, no goals    .  PharmD "I want to take care of my kidneys" (pt-stated)      CARE PLAN ENTRY (see longtitudinal plan of care for additional care plan information)  Current Barriers:  . Polypharmacy; complex patient with multiple comorbidities including HTN, OA, HLD, insomnia, hx breast cancer . Has questions regarding recent lab results, what they mean, and what her medications ar efor . Most recent eGFR: ~42 mL/min o HTN: losartan 100 mg daily, HCTZ 12.5 mg  daily, home BP 120/70s, though notes she has a wrist BP cuff. BP reading in office ~3 weeks ago was consistent o ASCVD risk reduction: atorvastatin 10 mg daily; last LDL not quite at goal <100; ASA 81 mg daily o Insomnia: trazodone 100 mg QPM o OA: taking turmeric daily o GERD: omeprazole 20 mg daily  o Supplements: MVI, vitamin C, vitamin B12, vitamin E, ferrous sulfate daily; garlic occasionally o Allergies: cetirizine PRN o Exercise: going to the gym 3 days per week, does treadmill and ab work; hasn't been going often lately because of work schedule  Pharmacist Clinical Goal(s):  Marland Kitchen Over the next 90 days, patient will work with PharmD and provider towards optimized medication management  Interventions: . Comprehensive medication review performed; medication list updated in electronic medical record . Inter-disciplinary care team collaboration (see longitudinal plan of care) . Reviewed recent lab results in depth and compared to last BMP. Reviewed that since patient was NOT fasting, we are not concerned w/ glucose level. . Reviewed goal BP <130/80 if tolerated to reduce risk of renal decline. Encouraged her to continue regimen w/ losartan 100 mg daily and HCTZ 12.5 mg daily. Encouraged to bring home BP cuf to the office at next visit to compare for accuracy . Encouraged adherence to atorvastatin. Could consider dose increase if LDL remains >100. Fill hx up to date . Reviewed Natural Meds. No issues w/ turmeric and renal function. Patient reports significant benefit with this, since meloxicam was stopped d/t CKD   Patient Self Care Activities:  . Patient will take medications as prescribed  Initial goal documentation     .  RNCM-Pt-"I take my blood pressure but it is the wrist cuff" (pt-stated)      CARE PLAN ENTRY (see longtitudinal plan of care for additional care plan information)  Current Barriers:  . Chronic Disease Management support, education, and care coordination needs  related to HTN, HLD, and Chronic arthritis pain (knee)  Clinical Goal(s) related to HTN, HLD, and Chronic arthritis pain (knee):  Over the next 120 days, patient will:  . Work with the care management team to address educational, disease management, and care coordination needs  . Begin or continue self health monitoring activities as directed today Measure and record blood pressure 2  times per week and adhere to a heart healthy/renal diet  . Call provider office for new or worsened signs and symptoms Blood pressure findings outside established parameters and New or worsened symptom related to HLD, chronic arthritis pain and other chronic conditions. . Call care management team with questions or concerns . Verbalize basic understanding of patient centered plan of care established today  Interventions related to HTN, HLD, and Chronic arthritis pain (knee):  Marland Kitchen Evaluation of current treatment plans and patient's adherence to plan as established by provider.  The patient is compliant with the plan of care. Saw pcp recently. 10-04-2020: Saw the specialist recently and blood pressure was 132/79 and heart rate 78.  The patient will follow up with the specialist again on 11-04-2020.   . Assessed patient understanding of disease states.  The patient has a good understanding of her conditions. 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 Talked about taking care of self and making time for her to have time for herself. 10-04-2020: Review of self care and sleep patterns. The patient says she is working on sleep time. The patient has to get up early to go to her mothers house to take over caregiver responsibilities. She lots of times will lay back down and rest. Education on taking care of self and getting adequate amounts of sleep.  . Assessed patient's education and care coordination needs. Education given to the patient on the normal range for blood pressures. The patient states she has it pretty well  managed right now. The patient has a wrist cuff and admits lots of times that it may be off. Denies headaches when her pressures are high. Education to be sent by MyChart and EMMI.  10-04-2020: the patient states her blood pressure continues to be controlled. She is thankful for this. Education and support given on the benefits of taking blood pressure regularly and recording.  . Provided disease specific education to patient: Educational material to be sent to the patient on dietary restrictions and other requested information from the patient. 08-09-2020: The patient is compliant with Heart Healthy diet. Wants to learn more about renal diet restrictions and protection of kidneys. 08-21-2020: The patient wanted reassurance that she was making the right dietary choices and limiting her sodium. She wants to do everything she can to prevent further damage to her kidneys and heart. 09-10-2020: The patient wanted information on Tylenol. Information provided to the patient. She ask about increasing her iron medications but advised the patient to talk to the pcp before taking any new medications or more than prescribed. Gave the patient alternative methods of helping with building up of iron. 10-04-2020: The patient is following a heart healthy renal diet. Education and support given.  Steele Sizer with appropriate clinical care team members regarding patient needs.  Will ask the CCM pharmacist to assist with medication review and medications that may be nephrotoxic. The patient is eager to learn what she can to protect her health and well being.  . Evaluation of upcoming appointments. The patient sees nephrology on 11-04-2020 and will follow up with the pcp on 02-17-2021.  Will continue to monitor.   Patient Self Care Activities related toHTN, HLD, and Chronic arthritis pain (knee):  Marland Kitchen Patient is unable to independently self-manage chronic health conditions  Please see past updates related to this goal by clicking  on the "Past Updates" button in the selected goal      .  RNCM: Pt-"I am really concerned about my  kidneys" (pt-stated)      CARE PLAN ENTRY (see longitudinal plan of care for additional care plan information)  Current Barriers:  Marland Kitchen Knowledge Deficits related to kidney function and medication of hydrochlorothiazide decreasing her kidney function . Care Coordination needs related to educational needs in a patient with CKD III (disease states) . Chronic Disease Management support and education needs related to renal function and abnormal labs related to medications and the patient wanting to understand her conditions better  Nurse Case Manager Clinical Goal(s):  Marland Kitchen Over the next 120 days, patient will verbalize understanding of plan for CKDIII . Over the next 120 days, patient will work with [facility] [place] team and pcp to address needs related to kidney function and understanding CKD III . Over the next 120 days, patient will attend all scheduled medical appointments: pcp follow up 07/30/2020 with lab work follow up on 05/16/2020 for repeat labs related to kidney function . Over the next 120 days, patient will verbalize basic understanding of CKDIII disease process and self health management plan as evidenced by understanding kidney function, how life style changes and diet can help in maintaining healthy kidney function. . Over the next 120 days, patient will demonstrate understanding of rationale for each prescribed medication as evidenced by compliance and review of medications with RNCM and pharmacist  . Over the next 120 days, patient will work with CM team pharmacist to understand BETA Blockers better and help with questions she has related to the medications she takes.   Interventions:  . Inter-disciplinary care team collaboration (see longitudinal plan of care) . Evaluation of current treatment plan related to CKDIII and patient's adherence to plan as established by provider. The patient is  concerned about her kidney function and wants to know all she can so she can make sure she is doing her part keep her kidneys from delcining further. Education and support given. Sees kidney specialist again in November.   . Advised patient to follow recommendations of pcp with decreasing the dose of Hydrochlorothiazide and having follow up labs on 05/16/2020.  08-09-2020: the patient saw the pcp recently and had labwork done. The patient states that she is eager to talk to the specialist. Review of what can cause renal function decline. The patient given resources. 10-04-2020: The patient saw the specialist and he told her to drink lemon and lime juices and water.  The patient provided education on flavored waters and also juice information. The patient got a good report from the kidney specialist. She is watching her diet carefully too. She says her iron level is still low but she continues to take the iron pills.  She thinks a lot of it has to do with the schedule she is on and taking care of her elderly mother.   . Provided education to patient re: renal function, CKDIII, and dietary restrictions for renal diet.  08-09-2020: The patient wants to learn all she can about improving her dietary habits and eating things that are good for her. Agrees to get information by [facility] and my chart. The patient called back briefly and ask about pomegranate juice for kidney health. Reviewed information with the patient. 08-21-2020: The patient had called and left a VM asking for a call back. The patient called back. She is concerned about cramps if she eats no salt in her diet. Education and support given. Explained 1 teaspoon of salt is equal to 2300 mg of sodium therefore she would need to use a little less  than a teaspoon of salt in a days time. She states she does not use this much at all. She does use other herbs, spices and a seasoning called "complete seasoning".  The patient wants to make sure she is doing her part to not  harm her kidneys any further. She also is drinking pomegranate juice and she says she is having good urinary habits. Praised the patient for good habits and changing her lifestyle habits to improve her health and well being. 09-10-2020: the patient was asking about juices to drink that would be good for her and things that would help with her iron level. The patient instructed to eat beets and leafy greens, and gave her other food options that will promote increased iron levels. 10-04-2020: the patient is doing well and feels better. Celebrating her 69th birthday soon.  . Reviewed medications with patient and discussed compliance. The patient ask question about the Hydrochlorothiazide and if decreasing the dose what does a BETA blocker mean?  Education provided and will do a pharmacy referral.  08-09-2020: Will ask for pharmacist to reach out to the patient to discuss medications that may be nephrotoxic. 10-04-2020: The patient is compliant with medications. States the new medication pcp put her on makes her go to the bathroom a lot. Education and support given. Will continue to monitor.  . Discussed plans with patient for ongoing care management follow up and provided patient with direct contact information for care management team . Pharmacy referral for education and support on possibly having the start taking a BETA blocker if the reduced dose of hydrochlorothiazide does not work.  Also for medications that may be nephrotoxic . Evaluation of activity level. The patient is going to the gym and working out. She is being proactive in her care. Praised for accomplishments.   Patient Self Care Activities:  . Patient verbalizes understanding of plan to work with CCM team to help her meet her health and wellness needs . Self administers medications as prescribed . Attends all scheduled provider appointments . Calls provider office for new concerns or questions . Unable to independently manage CKDIII  Please  see past updates related to this goal by clicking on the "Past Updates" button in the selected goal        Depression Screen PHQ 2/9 Scores 10/14/2020 04/19/2020 02/16/2020 10/12/2019 09/22/2019 08/23/2018 08/09/2017  PHQ - 2 Score 0 0 0 0 0 0 0  PHQ- 9 Score - - - - - 7 -    Fall Risk Fall Risk  10/14/2020 02/16/2020 10/12/2019 09/22/2019 08/23/2018  Falls in the past year? 0 0 0 0 No  Number falls in past yr: - 0 0 0 -  Injury with Fall? - 0 0 0 -  Risk for fall due to : Medication side effect - - - -  Follow up Falls evaluation completed;Education provided;Falls prevention discussed Falls evaluation completed - - -    Any stairs in or around the home? Yes  If so, are there any without handrails? Yes  Home free of loose throw rugs in walkways, pet beds, electrical cords, etc? Yes  Adequate lighting in your home to reduce risk of falls? Yes   ASSISTIVE DEVICES UTILIZED TO PREVENT FALLS:  Life alert? No  Use of a cane, walker or w/c? No  Grab bars in the bathroom? No  Shower chair or bench in shower? No  Elevated toilet seat or a handicapped toilet? No   TIMED UP AND GO:  Was  the test performed? No   Cognitive Function:     6CIT Screen 10/14/2020 10/12/2019  What Year? 0 points 0 points  What month? 0 points 0 points  What time? 0 points 0 points  Count back from 20 0 points 0 points  Months in reverse 0 points 0 points  Repeat phrase 0 points 2 points  Total Score 0 2    Immunizations Immunization History  Administered Date(s) Administered  . Moderna SARS-COVID-2 Vaccination 02/12/2020, 03/12/2020  . Td 12/22/2003    TDAP status: Due, Education has been provided regarding the importance of this vaccine. Advised may receive this vaccine at local pharmacy or Health Dept. Aware to provide a copy of the vaccination record if obtained from local pharmacy or Health Dept. Verbalized acceptance and understanding. Flu Vaccine status: Declined, Education has been provided  regarding the importance of this vaccine but patient still declined. Advised may receive this vaccine at local pharmacy or Health Dept. Aware to provide a copy of the vaccination record if obtained from local pharmacy or Health Dept. Verbalized acceptance and understanding. Pneumococcal vaccine status: Declined,  Education has been provided regarding the importance of this vaccine but patient still declined. Advised may receive this vaccine at local pharmacy or Health Dept. Aware to provide a copy of the vaccination record if obtained from local pharmacy or Health Dept. Verbalized acceptance and understanding.  Covid-19 vaccine status: Completed vaccines  Qualifies for Shingles Vaccine? Yes   Zostavax completed No   Shingrix Completed?: No.    Education has been provided regarding the importance of this vaccine. Patient has been advised to call insurance company to determine out of pocket expense if they have not yet received this vaccine. Advised may also receive vaccine at local pharmacy or Health Dept. Verbalized acceptance and understanding.  Screening Tests Health Maintenance  Topic Date Due  . DEXA SCAN  Never done  . INFLUENZA VACCINE  10/30/2020 (Originally 07/21/2020)  . TETANUS/TDAP  07/30/2021 (Originally 12/21/2013)  . PNA vac Low Risk Adult (1 of 2 - PCV13) 07/30/2021 (Originally 10/15/2018)  . MAMMOGRAM  09/05/2022  . COLONOSCOPY  01/16/2023  . COVID-19 Vaccine  Completed  . Hepatitis C Screening  Completed    Health Maintenance  Health Maintenance Due  Topic Date Due  . DEXA SCAN  Never done    Colorectal cancer screening: Completed 01/20/2013. Repeat every 10 years Mammogram status: Completed 09/05/2020. Repeat every year Bone Density status: Ordered 02/16/2020. Pt provided with contact info and advised to call to schedule appt.  Lung Cancer Screening: (Low Dose CT Chest recommended if Age 18-80 years, 30 pack-year currently smoking OR have quit w/in 15years.) does not  qualify.   Lung Cancer Screening Referral: no  Additional Screening:  Hepatitis C Screening: does qualify; Completed 08/09/2017  Vision Screening: Recommended annual ophthalmology exams for early detection of glaucoma and other disorders of the eye. Is the patient up to date with their annual eye exam?  No  Who is the provider or what is the name of the office in which the patient attends annual eye exams? Dr. Gloriann Loan If pt is not established with a provider, would they like to be referred to a provider to establish care? No .   Dental Screening: Recommended annual dental exams for proper oral hygiene  Community Resource Referral / Chronic Care Management: CRR required this visit?  No   CCM required this visit?  No      Plan:     I have  personally reviewed and noted the following in the patient's chart:   . Medical and social history . Use of alcohol, tobacco or illicit drugs  . Current medications and supplements . Functional ability and status . Nutritional status . Physical activity . Advanced directives . List of other physicians . Hospitalizations, surgeries, and ER visits in previous 12 months . Vitals . Screenings to include cognitive, depression, and falls . Referrals and appointments  In addition, I have reviewed and discussed with patient certain preventive protocols, quality metrics, and best practice recommendations. A written personalized care plan for preventive services as well as general preventive health recommendations were provided to patient.     Kellie Simmering, LPN   [id number]   Nurse Notes:

## 2020-10-16 ENCOUNTER — Ambulatory Visit: Payer: Medicare HMO | Admitting: Pharmacist

## 2020-10-16 DIAGNOSIS — E7800 Pure hypercholesterolemia, unspecified: Secondary | ICD-10-CM

## 2020-10-16 DIAGNOSIS — I10 Essential (primary) hypertension: Secondary | ICD-10-CM

## 2020-10-16 DIAGNOSIS — N1832 Chronic kidney disease, stage 3b: Secondary | ICD-10-CM

## 2020-10-16 NOTE — Progress Notes (Signed)
Chronic Care Management Pharmacy  Name: Kelly Poole  MRN: [id number] DOB: 1951-01-07   Chief Complaint/ HPI  Kelly Poole,  69 y.o. , female presents for their Follow-Up CCM visit with the clinical pharmacist via telephone.  PCP : Venita Lick, NP Patient Care Team: Venita Lick, NP as PCP - General (Nurse Practitioner) Vanita Ingles, RN as Case Manager ([place]) Vladimir Faster, [facility] (Pharmacist)  Their chronic conditions include: Hypertension, Hyperlipidemia, GERD, Chronic Kidney Disease and Osteoarthritis   Office Visits: 07/30/20- Marnee Guarneri, NP- blood work 05/17/20- Courtney Heys, PharmD - GFr ~42, patient has wrist cuff for BP - bring in for comparison  Consult Visit: 09/04/20- Dr. Juleen China, Nephrology -3A CKD, normocytic anemia, return 4 weeks  Allergies  Allergen Reactions   Macrobid [Nitrofurantoin Macrocrystal] Other (See Comments)    Sore throat and irritation to throat    Medications: Outpatient Encounter Medications as of 10/16/2020  Medication Sig Note   Ascorbic Acid (VITAMIN C) 1000 MG tablet Take 1,000 mg by mouth daily.    aspirin 81 MG tablet Take 81 mg by mouth daily.    atorvastatin (LIPITOR) 10 MG tablet Take 1 tablet (10 mg total) by mouth daily.    Cholecalciferol (VITAMIN D-3) 125 MCG (5000 UT) TABS Take 1 tablet by mouth daily. Takes every other day    ferrous sulfate 325 (65 FE) MG tablet Take 1 tablet (325 mg total) by mouth daily with breakfast.    hydrochlorothiazide (HYDRODIURIL) 25 MG tablet Take 1 tablet (25 mg total) by mouth daily.    losartan (COZAAR) 100 MG tablet Take 1 tablet (100 mg total) by mouth daily.    Multiple Vitamin (MULTIVITAMINS PO) Take by mouth daily.    omeprazole (PRILOSEC) 20 MG capsule Take 1 capsule (20 mg total) by mouth daily.    TURMERIC PO Take 1 capsule by mouth daily.    vitamin B-12 (CYANOCOBALAMIN) 1000 MCG tablet Take 1 tablet (1,000 mcg total) by mouth daily.     vitamin E 100 UNIT capsule Take 100 Units by mouth daily.    Cetirizine HCl (ALLERGY RELIEF) 10 MG CAPS Take by mouth.    Garlic 5102 MG CAPS Take 1,000 mg by mouth daily. 10/12/2019: Doesn't take regular   ketoconazole (NIZORAL) 2 % cream Apply 1 application topically daily. (Patient not taking: Reported on 10/14/2020)    mupirocin ointment (BACTROBAN) 2 % Apply 1 application topically 2 (two) times daily. (Patient not taking: Reported on 10/16/2020)    No facility-administered encounter medications on file as of 10/16/2020.    Wt Readings from Last 3 Encounters:  10/14/20 140 lb (63.5 kg)  07/30/20 142 lb 3.2 oz (64.5 kg)  04/17/20 147 lb 3.2 oz (66.8 kg)    Current Diagnosis/Assessment:    Goals Addressed            This Visit's Progress    Pharmacy Care Plan       CARE PLAN ENTRY (see longitudinal plan of care for additional care plan information)  Current Barriers:   Chronic Disease Management support, education, and care coordination needs related to Hypertension, Hyperlipidemia, GERD, Chronic Kidney Disease, Osteoarthritis, and Anemia and B12 deficiniency   Hypertension/CKD IIIB BP Readings from Last 3 Encounters:  09/04/20 132/79  07/30/20 114/75  04/22/20 122/82    Pharmacist Clinical Goal(s): o Over the next 90 days, patient will work with PharmD and providers to maintain BP goal <130/80  Current regimen:   Losartan 100 mg  qd  HCTZ 25  Mg qd (taking 12.66m qd)  Interventions: o Reviewed proper BP techniqueincluding  sitting down for at least 5 minutes before, resting calmly, with your feet flat on the floor.    o Provided diet and exercise counseling. o Reviewed notes from Nephrology indicating follow-up appt. Overdue and encouraged patient to schedule.  Patient self care activities - Over the next 90 days, patient will: o Check BP 1-2 times weekly, document, and provide at future appointments o Ensure daily salt intake < 2300  mg/day  Hyperlipidemia Lab Results  Component Value Date/Time   LDLCALC 114 (H) 07/30/2020 03:40 PM    Pharmacist Clinical Goal(s): o Over the next 90 days, patient will work with PharmD and providers to achieve LDL goal < 100  Current regimen:  o Atorvastatin 10 mg qd o Aspirin 81 mg qd  Interventions: o Provided diet and exercise counseling. o Discussed goal LDL and potential increase of statin dose  Patient self care activities - Over the next 90 days, patient will: o Cut back on fried chicken intake and avoid eating the skin. o Continue exercise regimen incorporating weight training   Medication management  Pharmacist Clinical Goal(s): o Over the next 90  days, patient will work with PharmD and providers to achieve optimal medication adherence  Current pharmacy: [facility] mail order  Interventions o Comprehensive medication review performed. o Continue current medication management strategy  Patient self care activities - Over the next 90 days, patient will: o Focus on medication adherence by fill dates o Take medications as prescribed o Report any questions or concerns to PharmD and/or provider(s)  Initial goal documentation        Hypertension/CKD IIIB   BP goal is:  <130/80  Office blood pressures are  BP Readings from Last 3 Encounters:  09/04/20 132/79  07/30/20 114/75  04/22/20 122/82   CMP Latest Ref Rng & Units 07/30/2020 05/16/2020 04/17/2020  Glucose 65 - 99 mg/dL 87 109(H) 95  BUN 8 - 27 mg/dL _0 Creatinine 0.57 - 1.00 mg/dL 1.41(H) 1.48(H) 1.50(H)  Sodium 134 - 144 mmol/L 137 141 138  Potassium 3.5 - 5.2 mmol/L 5.0 4.2 4.6  Chloride 96 - 106 mmol/L 100 105 104  CO2 20 - 29 mmol/L _1 Calcium 8.7 - 10.3 mg/dL 9.5 9.6 9.7  Total Protein 6.0 - 8.5 g/dL - - -  Total Bilirubin 0.0 - 1.2 mg/dL - - -  Alkaline Phos 39 - 117 IU/L - - -  AST 0 - 40 IU/L - - -  ALT 0 - 32 IU/L - - -    Patient checks BP at home weekly Patient home  BP readings are ranging: 121/70  Patient has failed these meds in the past: daily Patient is currently controlled on the following medications:   Losartan 100 mg qd  HCTZ 25  Mg qd (taking 12.575mqd)  We discussed diet and exercise extensively. Patient goes to the gym 3 days per week and uses the treadmill and elliptical machines for 30- 90 minutes. She is planning to get some dumbells for home use. She eats a mostly healthy diet consisting of lots of vegetables and is incorporating foods in renal protective diet handout given by Nephrology. She does eat fried chicken occasionally and will start avoiding the skin. Reviewed notes from Nephrology indicated at follow -up 10/02/20. Patient has not scheduled because the caregiver for her mother recently quit so she has been caring for  her mother 6am-6pm. She will schedule in future.  Plan  Continue current medications     Hyperlipidemia   LDL goal < 100  Lipid Panel     Component Value Date/Time   CHOL 201 (H) 07/30/2020 1540   CHOL 178 06/03/2015 1127   TRIG 207 (H) 07/30/2020 1540   TRIG 93 06/03/2015 1127   HDL 51 07/30/2020 1540   LDLCALC 114 (H) 07/30/2020 1540    Hepatic Function Latest Ref Rng & Units 02/16/2020 12/23/2018 08/23/2018  Total Protein 6.0 - 8.5 g/dL 7.7 7.5 7.6  Albumin 3.8 - 4.8 g/dL 4.7 4.5 4.3  AST 0 - 40 IU/L _0 ALT 0 - 32 IU/L 32 17 18  Alk Phosphatase 39 - 117 IU/L 98 79 74  Total Bilirubin 0.0 - 1.2 mg/dL <0.2 <0.2 <0.2     The 10-year ASCVD risk score Mikey Bussing DC Jr., et al., 2013) is: 11%   Values used to calculate the score:     Age: 64 years     Sex: Female     Is Non-Hispanic African American: Yes     Diabetic: No     Tobacco smoker: No     Systolic Blood Pressure: 409 mmHg     Is BP treated: Yes     HDL Cholesterol: 51 mg/dL     Total Cholesterol: 201 mg/dL   Patient has failed these meds in past: NA Patient is currently uncontrolled on the following medications:   Atorvastatin 10 mg  qd  Aspirin 81 mg qd  We discussed:  diet and exercise extensively. Reviewed LDL is slightly elevated and may need to increase dose of atorvastatin at next PCP appointment.  Plan  Continue current medications. Consider increasing atorvastatin to 20 mg if still elevated at next visit.  GERD   Patient has failed these meds in past: NA Patient is currently controlled on the following medications:   Omeprazole 40 mg qd  We discussed:  Patient reports she is symptom free. Magnesium level 1.9 at Nephrology visit in September.  Plan  Continue current medications.  Osteoarthritis   Patient has failed these meds in past: Meloxicam Patient is currently controlled on the following medications:   Turmeric supplement daily  We discussed:  Patient reports good relief with turmeric. She does require any OTC acetaminophen or NSAIDS in addtion. Plan  Continue current medications    Medication Management   Pt uses [facility] mail order pharmacy for all medications Pt endorses 95% compliance  We discussed: Current pharmacy is preferred with insurance plan and patient is satisfied with pharmacy services  Plan  Continue current medication management strategy. Will discuss anemia, and B12 deficinency at DEXA screening  at next appointment.    Follow up: 3  month phone visit  Junita Push. Kenton Kingfisher PharmD, [facility] [phone]

## 2020-10-16 NOTE — Patient Instructions (Addendum)
Visit Information  It was a pleasure speaking with you today. Thank you for letting me be part of your clinical team. Please call with any questions or concerns.   Goals Addressed            This Visit's Progress   . Pharmacy Care Plan       CARE PLAN ENTRY (see longitudinal plan of care for additional care plan information)  Current Barriers:  . Chronic Disease Management support, education, and care coordination needs related to Hypertension, Hyperlipidemia, GERD, Chronic Kidney Disease, Osteoarthritis, and Anemia and B12 deficiniency   Hypertension/CKD IIIB BP Readings from Last 3 Encounters:  09/04/20 132/79  07/30/20 114/75  04/22/20 122/82   . Pharmacist Clinical Goal(s): o Over the next 90 days, patient will work with PharmD and providers to maintain BP goal <130/80 . Current regimen:  . Losartan 100 mg qd . HCTZ 25  Mg qd (taking 12.5mg  qd) . Interventions: o Reviewed proper BP techniqueincluding  sitting down for at least 5 minutes before, resting calmly, with your feet flat on the floor.    o Provided diet and exercise counseling. o Reviewed notes from Nephrology indicating follow-up appt. Overdue and encouraged patient to schedule. . Patient self care activities - Over the next 90 days, patient will: o Check BP 1-2 times weekly, document, and provide at future appointments o Ensure daily salt intake < 2300 mg/day  Hyperlipidemia Lab Results  Component Value Date/Time   LDLCALC 114 (H) 07/30/2020 03:40 PM   . Pharmacist Clinical Goal(s): o Over the next 90 days, patient will work with PharmD and providers to achieve LDL goal < 100 . Current regimen:  o Atorvastatin 10 mg qd o Aspirin 81 mg qd . Interventions: o Provided diet and exercise counseling. o Discussed goal LDL and potential increase of statin dose . Patient self care activities - Over the next 90 days, patient will: o Cut back on fried chicken intake and avoid eating the skin. o Continue exercise  regimen incorporating weight training   Medication management . Pharmacist Clinical Goal(s): o Over the next 90  days, patient will work with PharmD and providers to achieve optimal medication adherence . Current pharmacy: [facility] mail order . Interventions o Comprehensive medication review performed. o Continue current medication management strategy . Patient self care activities - Over the next 90 days, patient will: o Focus on medication adherence by fill dates o Take medications as prescribed o Report any questions or concerns to PharmD and/or provider(s)  Initial goal documentation        The patient verbalized understanding of instructions provided today and agreed to receive a mailed copy of patient instruction and/or educational materials.  Telephone follow up appointment with pharmacy team member scheduled for: 3 months Junita Push. Verity Gilcrest PharmD, BCPS Clinical Pharmacist [phone]  High Cholesterol  High cholesterol is a condition in which the blood has high levels of a white, waxy, fat-like substance (cholesterol). The human body needs small amounts of cholesterol. The liver makes all the cholesterol that the body needs. Extra (excess) cholesterol comes from the food that we eat. Cholesterol is carried from the liver by the blood through the blood vessels. If you have high cholesterol, deposits (plaques) may build up on the walls of your blood vessels (arteries). Plaques make the arteries narrower and stiffer. Cholesterol plaques increase your risk for heart attack and stroke. Work with your health care provider to keep your cholesterol levels in a healthy range. What increases the  risk? This condition is more likely to develop in people who:  Eat foods that are high in animal fat (saturated fat) or cholesterol.  Are overweight.  Are not getting enough exercise.  Have a family history of high cholesterol. What are the signs or symptoms? There are no symptoms of  this condition. How is this diagnosed? This condition may be diagnosed from the results of a blood test.  If you are older than age 14, your health care provider may check your cholesterol every 4-6 years.  You may be checked more often if you already have high cholesterol or other risk factors for heart disease. The blood test for cholesterol measures:  "Bad" cholesterol (LDL cholesterol). This is the main type of cholesterol that causes heart disease. The desired level for LDL is less than 100.  "Good" cholesterol (HDL cholesterol). This type helps to protect against heart disease by cleaning the arteries and carrying the LDL away. The desired level for HDL is 60 or higher.  Triglycerides. These are fats that the body can store or burn for energy. The desired number for triglycerides is lower than 150.  Total cholesterol. This is a measure of the total amount of cholesterol in your blood, including LDL cholesterol, HDL cholesterol, and triglycerides. A healthy number is less than 200. How is this treated? This condition is treated with diet changes, lifestyle changes, and medicines. Diet changes  This may include eating more whole grains, fruits, vegetables, nuts, and fish.  This may also include cutting back on red meat and foods that have a lot of added sugar. Lifestyle changes  Changes may include getting at least 40 minutes of aerobic exercise 3 times a week. Aerobic exercises include walking, biking, and swimming. Aerobic exercise along with a healthy diet can help you maintain a healthy weight.  Changes may also include quitting smoking. Medicines  Medicines are usually given if diet and lifestyle changes have failed to reduce your cholesterol to healthy levels.  Your health care provider may prescribe a statin medicine. Statin medicines have been shown to reduce cholesterol, which can reduce the risk of heart disease. Follow these instructions at home: Eating and  drinking If told by your health care provider:  Eat chicken (without skin), fish, veal, shellfish, ground [place] breast, and round or loin cuts of red meat.  Do not eat fried foods or fatty meats, such as hot dogs and salami.  Eat plenty of fruits, such as apples.  Eat plenty of vegetables, such as broccoli, potatoes, and carrots.  Eat beans, peas, and lentils.  Eat grains such as barley, rice, couscous, and bulgur wheat.  Eat pasta without cream sauces.  Use skim or nonfat milk, and eat low-fat or nonfat yogurt and cheeses.  Do not eat or drink whole milk, cream, ice cream, egg yolks, or hard cheeses.  Do not eat stick margarine or tub margarines that contain trans fats (also called partially hydrogenated oils).  Do not eat saturated tropical oils, such as coconut oil and palm oil.  Do not eat cakes, cookies, crackers, or other baked goods that contain trans fats.  General instructions  Exercise as directed by your health care provider. Increase your activity level with activities such as gardening, walking, and taking the stairs.  Take over-the-counter and prescription medicines only as told by your health care provider.  Do not use any products that contain nicotine or tobacco, such as cigarettes and e-cigarettes. If you need help quitting, ask your health care  provider.  Keep all follow-up visits as told by your health care provider. This is important. Contact a health care provider if:  You are struggling to maintain a healthy diet or weight.  You need help to start on an exercise program.  You need help to stop smoking. Get help right away if:  You have chest pain.  You have trouble breathing. This information is not intended to replace advice given to you by your health care provider. Make sure you discuss any questions you have with your health care provider. Document Revised: 12/10/2017 Document Reviewed: 06/06/2016 Elsevier Patient Education  [place].

## 2020-10-17 ENCOUNTER — Telehealth: Payer: Self-pay

## 2020-10-17 NOTE — Telephone Encounter (Signed)
    MA10/28/2021 1st Attempt  Name: Kelly Poole   MRN: [id number]   DOB: 11-03-1951   AGE: 69 y.o.   GENDER: female   PCP Venita Lick, NP.   10/17/20 [place] applying for the Crisis Intervention Program since they now have funds available.  Sent Crisis Intervention Program application with instructions on where to mail completed form and the number to call if she has any questions.  Patient stated that she has check with the [employer] and they are unable to help with her rent unless she is late.  I will call patient in the next 7 days to ensure she has received the CIP application.     Suzi Hernan, AAS Paralegal, [place] . Embedded Care Coordination [facility]  Care Management  [address], [place] [address] [email or web address]  [phone]   [email or web address]

## 2020-10-24 ENCOUNTER — Telehealth: Payer: Self-pay

## 2020-10-24 NOTE — Telephone Encounter (Signed)
    MA11/03/2020   Name: Kelly Poole   MRN: [id number]   DOB: [birth year]   AGE: 69 y.o.   GENDER: female   PCP Venita Lick, NP.   10/24/20 Spoke with patient she has applied for the [employer] but does not qualify because her bill is current. She has also applied for Food Stamps but does not qualify.  Sending a request to [facility] for [place] cards to assist patient.  Will call patient if she is approved.  Will follow-up within the next 7 days.    Harmoni Lucus, AAS Paralegal, [place] . Embedded Care Coordination [facility]  Care Management  [address], [place] [address] [email or web address]  [phone]   [email or web address]

## 2020-10-31 ENCOUNTER — Telehealth: Payer: Self-pay

## 2020-10-31 NOTE — Telephone Encounter (Signed)
    MA11/10/2020   Name: Kelly Poole   MRN: [id number]   DOB: February 08, 1951   AGE: 69 y.o.   GENDER: female   PCP Venita Lick, NP.   10/31/20 Received email confirmation [name] she has put the $200.00 [employer] gift card in [employer] today to [facility]  Spoke with patient to let her know the gift cards were being sent to the practice and she could pick them up and to call and check first. Also let patient know the [employer] has funds available to assist with [employer] and [employer].  No additional resources are needed at this time. Closing referral.    [name], AAS Paralegal, [place] . Embedded Care Coordination [facility]  Care Management  [address], [place] [address] [name]  [phone]   [email or web address]

## 2020-11-04 ENCOUNTER — Ambulatory Visit: Payer: Self-pay | Admitting: General Practice

## 2020-11-04 DIAGNOSIS — N1832 Chronic kidney disease, stage 3b: Secondary | ICD-10-CM

## 2020-11-04 DIAGNOSIS — I10 Essential (primary) hypertension: Secondary | ICD-10-CM

## 2020-11-04 DIAGNOSIS — E7800 Pure hypercholesterolemia, unspecified: Secondary | ICD-10-CM

## 2020-11-04 DIAGNOSIS — M1711 Unilateral primary osteoarthritis, right knee: Secondary | ICD-10-CM

## 2020-11-04 NOTE — Chronic Care Management (AMB) (Signed)
Chronic Care Management   Follow Up Note   11/04/2020 Name: Kelly Poole MRN: [id number] DOB: 1951-04-01  Referred by: Venita Lick, NP Reason for referral : Chronic Care Management (RNCM follow up call for Chronic Disease Management and Care Coordination Needs)   Kelly Poole is a 69 y.o. year old female who is a primary care patient of Cannady, Barbaraann Faster, NP. The CCM team was consulted for assistance with chronic disease management and care coordination needs.    Review of patient status, including review of consultants reports, relevant laboratory and other test results, and collaboration with appropriate care team members and the patient's provider was performed as part of comprehensive patient evaluation and provision of chronic care management services.    SDOH (Social Determinants of Health) assessments performed: Yes See Care Plan activities for detailed interventions related to [facility])     Outpatient Encounter Medications as of 11/04/2020  Medication Sig Note  . Ascorbic Acid (VITAMIN C) 1000 MG tablet Take 1,000 mg by mouth daily.   Marland Kitchen aspirin 81 MG tablet Take 81 mg by mouth daily.   Marland Kitchen atorvastatin (LIPITOR) 10 MG tablet Take 1 tablet (10 mg total) by mouth daily.   . Cetirizine HCl (ALLERGY RELIEF) 10 MG CAPS Take by mouth.   . Cholecalciferol (VITAMIN D-3) 125 MCG (5000 UT) TABS Take 1 tablet by mouth daily. Takes every other day   . ferrous sulfate 325 (65 FE) MG tablet Take 1 tablet (325 mg total) by mouth daily with breakfast.   . Garlic 7989 MG CAPS Take 1,000 mg by mouth daily. 10/12/2019: Doesn't take regular  . hydrochlorothiazide (HYDRODIURIL) 25 MG tablet Take 1 tablet (25 mg total) by mouth daily.   Marland Kitchen ketoconazole (NIZORAL) 2 % cream Apply 1 application topically daily. (Patient not taking: Reported on 10/14/2020)   . losartan (COZAAR) 100 MG tablet Take 1 tablet (100 mg total) by mouth daily.   . Multiple Vitamin (MULTIVITAMINS PO) Take by mouth  daily.   . mupirocin ointment (BACTROBAN) 2 % Apply 1 application topically 2 (two) times daily. (Patient not taking: Reported on 10/16/2020)   . omeprazole (PRILOSEC) 20 MG capsule Take 1 capsule (20 mg total) by mouth daily.   . TURMERIC PO Take 1 capsule by mouth daily.   . vitamin B-12 (CYANOCOBALAMIN) 1000 MCG tablet Take 1 tablet (1,000 mcg total) by mouth daily.   . vitamin E 100 UNIT capsule Take 100 Units by mouth daily.    No facility-administered encounter medications on file as of 11/04/2020.     Objective:  BP Readings from Last 3 Encounters:  09/04/20 132/79  07/30/20 114/75  04/22/20 122/82    Goals Addressed              This Visit's Progress   .  RNCM-Pt-"I take my blood pressure but it is the wrist cuff" (pt-stated)        CARE PLAN ENTRY (see longtitudinal plan of care for additional care plan information)  Current Barriers:  . Chronic Disease Management support, education, and care coordination needs related to HTN, HLD, and Chronic arthritis pain (knee)  Clinical Goal(s) related to HTN, HLD, and Chronic arthritis pain (knee):  Over the next 120 days, patient will:  . Work with the care management team to address educational, disease management, and care coordination needs  . Begin or continue self health monitoring activities as directed today Measure and record blood pressure 2 times per week and adhere to a  heart healthy/renal diet  . Call provider office for new or worsened signs and symptoms Blood pressure findings outside established parameters and New or worsened symptom related to HLD, chronic arthritis pain and other chronic conditions. . Call care management team with questions or concerns . Verbalize basic understanding of patient centered plan of care established today  Interventions related to HTN, HLD, and Chronic arthritis pain (knee):  Marland Kitchen Evaluation of current treatment plans and patient's adherence to plan as established by provider.  The  patient is compliant with the plan of care. Saw pcp recently. 10-04-2020: Saw the specialist recently and blood pressure was 132/79 and heart rate 78.  The patient will follow up with the specialist again on 11-04-2020.   . Assessed patient understanding of disease states.  The patient has a good understanding of her conditions. 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 Talked about taking care of self and making time for her to have time for herself. 11-04-2020: Review of self care and sleep patterns. The patient says she is working on sleep time. The patient has to get up early to go to her mothers house to take over caregiver responsibilities. She lots of times will lay back down and rest. Education on taking care of self and getting adequate amounts of sleep.  . Assessed patient's education and care coordination needs. Education given to the patient on the normal range for blood pressures. The patient states she has it pretty well managed right now. The patient has a wrist cuff and admits lots of times that it may be off. Denies headaches when her pressures are high. Education to be sent by MyChart and EMMI.  11-04-2020: the patient states her blood pressure continues to be controlled. She is thankful for this. Education and support given on the benefits of taking blood pressure regularly and recording. The patient states her most recent blood pressure was 128/72. Marland Kitchen Provided disease specific education to patient: Educational material to be sent to the patient on dietary restrictions and other requested information from the patient. 08-09-2020: The patient is compliant with Heart Healthy diet. Wants to learn more about renal diet restrictions and protection of kidneys. 08-21-2020: The patient wanted reassurance that she was making the right dietary choices and limiting her sodium. She wants to do everything she can to prevent further damage to her kidneys and heart. 09-10-2020: The patient wanted  information on Tylenol. Information provided to the patient. She ask about increasing her iron medications but advised the patient to talk to the pcp before taking any new medications or more than prescribed. Gave the patient alternative methods of helping with building up of iron. 11-04-2020: The patient is following a heart healthy renal diet. Education and support given.  Nash Dimmer with appropriate clinical care team members regarding patient needs.  Will ask the CCM pharmacist to assist with medication review and medications that may be nephrotoxic. The patient is eager to learn what she can to protect her health and well being.  . Evaluation of upcoming appointments. The patient sees nephrology on 11-19-2020 and will follow up with the pcp on 02-17-2021.  Will continue to monitor.   Patient Self Care Activities related toHTN, HLD, and Chronic arthritis pain (knee):  Marland Kitchen Patient is unable to independently self-manage chronic health conditions  Please see past updates related to this goal by clicking on the "Past Updates" button in the selected goal      .  RNCM: Pt-"I am really concerned  about my kidneys" (pt-stated)        CARE PLAN ENTRY (see longitudinal plan of care for additional care plan information)  Current Barriers:  Marland Kitchen Knowledge Deficits related to kidney function and medication of hydrochlorothiazide decreasing her kidney function . Care Coordination needs related to educational needs in a patient with CKD III (disease states) . Chronic Disease Management support and education needs related to renal function and abnormal labs related to medications and the patient wanting to understand her conditions better  Nurse Case Manager Clinical Goal(s):  Marland Kitchen Over the next 120 days, patient will verbalize understanding of plan for CKDIII . Over the next 120 days, patient will work with [facility] team and pcp to address needs related to kidney function and understanding CKD III . Over the  next 120 days, patient will attend all scheduled medical appointments: pcp follow up 07/30/2020 with lab work follow up on 05/16/2020 for repeat labs related to kidney function . Over the next 120 days, patient will verbalize basic understanding of CKDIII disease process and self health management plan as evidenced by understanding kidney function, how life style changes and diet can help in maintaining healthy kidney function. . Over the next 120 days, patient will demonstrate understanding of rationale for each prescribed medication as evidenced by compliance and review of medications with RNCM and pharmacist  . Over the next 120 days, patient will work with CM team pharmacist to understand BETA Blockers better and help with questions she has related to the medications she takes.   Interventions:  . Inter-disciplinary care team collaboration (see longitudinal plan of care) . Evaluation of current treatment plan related to CKDIII and patient's adherence to plan as established by provider. The patient is concerned about her kidney function and wants to know all she can so she can make sure she is doing her part keep her kidneys from delcining further. Education and support given. Sees kidney specialist again in November.   . Advised patient to follow recommendations of pcp with decreasing the dose of Hydrochlorothiazide and having follow up labs on 05/16/2020.  08-09-2020: the patient saw the pcp recently and had labwork done. The patient states that she is eager to talk to the specialist. Review of what can cause renal function decline. The patient given resources. 10-04-2020: The patient saw the specialist and he told her to drink lemon and lime juices and water.  The patient provided education on flavored waters and also juice information. The patient got a good report from the kidney specialist. She is watching her diet carefully too. She says her iron level is still low but she continues to take the iron  pills.  She thinks a lot of it has to do with the schedule she is on and taking care of her elderly mother.   . Provided education to patient re: renal function, CKDIII, and dietary restrictions for renal diet.  08-09-2020: The patient wants to learn all she can about improving her dietary habits and eating things that are good for her. Agrees to get information by [facility] and my chart. The patient called back briefly and ask about pomegranate juice for kidney health. Reviewed information with the patient. 08-21-2020: The patient had called and left a VM asking for a call back. The patient called back. She is concerned about cramps if she eats no salt in her diet. Education and support given. Explained 1 teaspoon of salt is equal to 2300 mg of sodium therefore she would need  to use a little less than a teaspoon of salt in a days time. She states she does not use this much at all. She does use other herbs, spices and a seasoning called "complete seasoning".  The patient wants to make sure she is doing her part to not harm her kidneys any further. She also is drinking pomegranate juice and she says she is having good urinary habits. Praised the patient for good habits and changing her lifestyle habits to improve her health and well being. 09-10-2020: the patient was asking about juices to drink that would be good for her and things that would help with her iron level. The patient instructed to eat beets and leafy greens, and gave her other food options that will promote increased iron levels. 11-04-2020: the patient had called and left a message asking for a call back. She has been working with the care guides to get assistance with food and light bill. The patient wanted to know if her gift card was available.  Re-read the notes from the care guides concerning the 200.00 gift card and to contact the other resources for help with light and gas bill. Contacted the office staff and ask if her gift card had been received at  the office. The staff have not seen the gift card yet.Education given to the patient.  . Reviewed medications with patient and discussed compliance. The patient ask question about the Hydrochlorothiazide and if decreasing the dose what does a BETA blocker mean?  Education provided and will do a pharmacy referral.  08-09-2020: Will ask for pharmacist to reach out to the patient to discuss medications that may be nephrotoxic. 11-04-2020: The patient is compliant with medications. States the new medication pcp put her on makes her go to the bathroom a lot. The patient is trying to monitor her cholesterol. Education on discussing fish oil with the provider and pharmacist on next appointment. Education and support given. Will continue to monitor.  . Discussed plans with patient for ongoing care management follow up and provided patient with direct contact information for care management team . Pharmacy referral for education and support on possibly having the start taking a BETA blocker if the reduced dose of hydrochlorothiazide does not work.  Also for medications that may be nephrotoxic . Evaluation of activity level. The patient is going to the gym and working out. She is being proactive in her care. Praised for accomplishments.   Patient Self Care Activities:  . Patient verbalizes understanding of plan to work with CCM team to help her meet her health and wellness needs . Self administers medications as prescribed . Attends all scheduled provider appointments . Calls provider office for new concerns or questions . Unable to independently manage CKDIII  Please see past updates related to this goal by clicking on the "Past Updates" button in the selected goal          Plan:   Telephone follow up appointment with care management team member scheduled for: 12-06-2020 at 0900 am   Noreene Larsson RN, MSN, [facility] Mobile: [phone]

## 2020-11-04 NOTE — Patient Instructions (Signed)
Visit Information  Goals Addressed              This Visit's Progress   .  RNCM-Pt-"I take my blood pressure but it is the wrist cuff" (pt-stated)        CARE PLAN ENTRY (see longtitudinal plan of care for additional care plan information)  Current Barriers:  . Chronic Disease Management support, education, and care coordination needs related to HTN, HLD, and Chronic arthritis pain (knee)  Clinical Goal(s) related to HTN, HLD, and Chronic arthritis pain (knee):  Over the next 120 days, patient will:  . Work with the care management team to address educational, disease management, and care coordination needs  . Begin or continue self health monitoring activities as directed today Measure and record blood pressure 2 times per week and adhere to a heart healthy/renal diet  . Call provider office for new or worsened signs and symptoms Blood pressure findings outside established parameters and New or worsened symptom related to HLD, chronic arthritis pain and other chronic conditions. . Call care management team with questions or concerns . Verbalize basic understanding of patient centered plan of care established today  Interventions related to HTN, HLD, and Chronic arthritis pain (knee):  Marland Kitchen Evaluation of current treatment plans and patient's adherence to plan as established by provider.  The patient is compliant with the plan of care. Saw pcp recently. 10-04-2020: Saw the specialist recently and blood pressure was 132/79 and heart rate 78.  The patient will follow up with the specialist again on 11-04-2020.   . Assessed patient understanding of disease states.  The patient has a good understanding of her conditions. 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 Talked about taking care of self and making time for her to have time for herself. 11-04-2020: Review of self care and sleep patterns. The patient says she is working on sleep time. The patient has to get up early to go to her  mothers house to take over caregiver responsibilities. She lots of times will lay back down and rest. Education on taking care of self and getting adequate amounts of sleep.  . Assessed patient's education and care coordination needs. Education given to the patient on the normal range for blood pressures. The patient states she has it pretty well managed right now. The patient has a wrist cuff and admits lots of times that it may be off. Denies headaches when her pressures are high. Education to be sent by MyChart and EMMI.  11-04-2020: the patient states her blood pressure continues to be controlled. She is thankful for this. Education and support given on the benefits of taking blood pressure regularly and recording. The patient states her most recent blood pressure was 128/72. Marland Kitchen Provided disease specific education to patient: Educational material to be sent to the patient on dietary restrictions and other requested information from the patient. 08-09-2020: The patient is compliant with Heart Healthy diet. Wants to learn more about renal diet restrictions and protection of kidneys. 08-21-2020: The patient wanted reassurance that she was making the right dietary choices and limiting her sodium. She wants to do everything she can to prevent further damage to her kidneys and heart. 09-10-2020: The patient wanted information on Tylenol. Information provided to the patient. She ask about increasing her iron medications but advised the patient to talk to the pcp before taking any new medications or more than prescribed. Gave the patient alternative methods of helping with building up of iron. 11-04-2020:  The patient is following a heart healthy renal diet. Education and support given.  Nash Dimmer with appropriate clinical care team members regarding patient needs.  Will ask the CCM pharmacist to assist with medication review and medications that may be nephrotoxic. The patient is eager to learn what she can to  protect her health and well being.  . Evaluation of upcoming appointments. The patient sees nephrology on 11-19-2020 and will follow up with the pcp on 02-17-2021.  Will continue to monitor.   Patient Self Care Activities related toHTN, HLD, and Chronic arthritis pain (knee):  Marland Kitchen Patient is unable to independently self-manage chronic health conditions  Please see past updates related to this goal by clicking on the "Past Updates" button in the selected goal      .  RNCM: Pt-"I am really concerned about my kidneys" (pt-stated)        CARE PLAN ENTRY (see longitudinal plan of care for additional care plan information)  Current Barriers:  Marland Kitchen Knowledge Deficits related to kidney function and medication of hydrochlorothiazide decreasing her kidney function . Care Coordination needs related to educational needs in a patient with CKD III (disease states) . Chronic Disease Management support and education needs related to renal function and abnormal labs related to medications and the patient wanting to understand her conditions better  Nurse Case Manager Clinical Goal(s):  Marland Kitchen Over the next 120 days, patient will verbalize understanding of plan for CKDIII . Over the next 120 days, patient will work with [facility], [place] team and pcp to address needs related to kidney function and understanding CKD III . Over the next 120 days, patient will attend all scheduled medical appointments: pcp follow up 07/30/2020 with lab work follow up on 05/16/2020 for repeat labs related to kidney function . Over the next 120 days, patient will verbalize basic understanding of CKDIII disease process and self health management plan as evidenced by understanding kidney function, how life style changes and diet can help in maintaining healthy kidney function. . Over the next 120 days, patient will demonstrate understanding of rationale for each prescribed medication as evidenced by compliance and review of medications with RNCM and  pharmacist  . Over the next 120 days, patient will work with CM team pharmacist to understand BETA Blockers better and help with questions she has related to the medications she takes.   Interventions:  . Inter-disciplinary care team collaboration (see longitudinal plan of care) . Evaluation of current treatment plan related to CKDIII and patient's adherence to plan as established by provider. The patient is concerned about her kidney function and wants to know all she can so she can make sure she is doing her part keep her kidneys from delcining further. Education and support given. Sees kidney specialist again in November.   . Advised patient to follow recommendations of pcp with decreasing the dose of Hydrochlorothiazide and having follow up labs on 05/16/2020.  08-09-2020: the patient saw the pcp recently and had labwork done. The patient states that she is eager to talk to the specialist. Review of what can cause renal function decline. The patient given resources. 10-04-2020: The patient saw the specialist and he told her to drink lemon and lime juices and water.  The patient provided education on flavored waters and also juice information. The patient got a good report from the kidney specialist. She is watching her diet carefully too. She says her iron level is still low but she continues to take the iron pills.  She thinks a lot of it has to do with the schedule she is on and taking care of her elderly mother.   . Provided education to patient re: renal function, CKDIII, and dietary restrictions for renal diet.  08-09-2020: The patient wants to learn all she can about improving her dietary habits and eating things that are good for her. Agrees to get information by [facility] and my chart. The patient called back briefly and ask about pomegranate juice for kidney health. Reviewed information with the patient. 08-21-2020: The patient had called and left a VM asking for a call back. The patient called back. She  is concerned about cramps if she eats no salt in her diet. Education and support given. Explained 1 teaspoon of salt is equal to 2300 mg of sodium therefore she would need to use a little less than a teaspoon of salt in a days time. She states she does not use this much at all. She does use other herbs, spices and a seasoning called "complete seasoning".  The patient wants to make sure she is doing her part to not harm her kidneys any further. She also is drinking pomegranate juice and she says she is having good urinary habits. Praised the patient for good habits and changing her lifestyle habits to improve her health and well being. 09-10-2020: the patient was asking about juices to drink that would be good for her and things that would help with her iron level. The patient instructed to eat beets and leafy greens, and gave her other food options that will promote increased iron levels. 11-04-2020: the patient had called and left a message asking for a call back. She has been working with the care guides to get assistance with food and light bill. The patient wanted to know if her gift card was available.  Re-read the notes from the care guides concerning the 200.00 gift card and to contact the other resources for help with light and gas bill. Contacted the office staff and ask if her gift card had been received at the office. The staff have not seen the gift card yet.Education given to the patient.  . Reviewed medications with patient and discussed compliance. The patient ask question about the Hydrochlorothiazide and if decreasing the dose what does a BETA blocker mean?  Education provided and will do a pharmacy referral.  08-09-2020: Will ask for pharmacist to reach out to the patient to discuss medications that may be nephrotoxic. 11-04-2020: The patient is compliant with medications. States the new medication pcp put her on makes her go to the bathroom a lot. The patient is trying to monitor her cholesterol.  Education on discussing fish oil with the provider and pharmacist on next appointment. Education and support given. Will continue to monitor.  . Discussed plans with patient for ongoing care management follow up and provided patient with direct contact information for care management team . Pharmacy referral for education and support on possibly having the start taking a BETA blocker if the reduced dose of hydrochlorothiazide does not work.  Also for medications that may be nephrotoxic . Evaluation of activity level. The patient is going to the gym and working out. She is being proactive in her care. Praised for accomplishments.   Patient Self Care Activities:  . Patient verbalizes understanding of plan to work with CCM team to help her meet her health and wellness needs . Self administers medications as prescribed . Attends all scheduled provider appointments . Calls  provider office for new concerns or questions . Unable to independently manage CKDIII  Please see past updates related to this goal by clicking on the "Past Updates" button in the selected goal         The patient verbalized understanding of instructions, educational materials, and care plan provided today and declined offer to receive copy of patient instructions, educational materials, and care plan.   Telephone follow up appointment with care management team member scheduled for: 12-06-2020 at 0900 am  Noreene Larsson RN, MSN, [facility] Mobile: [phone]

## 2020-11-05 ENCOUNTER — Telehealth: Payer: Self-pay

## 2020-11-05 NOTE — Telephone Encounter (Signed)
Called pt to let her know that we received gift card, no answer, left vm

## 2020-11-07 NOTE — Telephone Encounter (Signed)
Pt came and picked up gift card

## 2020-12-02 ENCOUNTER — Telehealth: Payer: Self-pay | Admitting: Pharmacist

## 2020-12-02 NOTE — Telephone Encounter (Signed)
Patient called and LVM, but did not note why she was calling.   Routing to current embedded care management team to address.

## 2020-12-04 ENCOUNTER — Ambulatory Visit: Payer: Self-pay | Admitting: *Deleted

## 2020-12-04 NOTE — Telephone Encounter (Signed)
Please alert patient this is side effect we have not seen often.  I would be okay with her taking Alka Seltzer as needed.  Muscle aches are common post vaccine, continue Tylenol as needed.  Monitor diet and avoid fast food, greasy food.  Eat more bland diet until heart burn improved.  If worsening then recommend visit.

## 2020-12-04 NOTE — Telephone Encounter (Signed)
Patient notified.

## 2020-12-04 NOTE — Telephone Encounter (Signed)
Had [employer] Saturday. Sunday evening began having indigestion-like feeling along with muscle pain. Denies SOB/pain/dizziness/heaviness/pressure.Producing gas also. Takes a tylenol and it relieves both. Worse yesterday, less today. No fever. She wants to try alka seltzer. Care advice including no greasy/fatty foods/increase water intake/try anti-acid. If symptoms worse or CP/SOB occurs, seek evaluation immediately.

## 2020-12-05 ENCOUNTER — Telehealth: Payer: Self-pay | Admitting: Pharmacist

## 2020-12-05 NOTE — Chronic Care Management (AMB) (Addendum)
12/05/2020-Called and spoke to the patient. Per patient; she can't remember when or why she called. Patient verbalized she does not remember things that good. Patient took a minute or two to recollect her thoughts over the phone call to finally remembering she recently went to get her booster shot and was having symptoms. She was experiencing muscle pain, chills, and tightness in chest.Per patient; was told by nurse staff at [facility] it could be a sign of gas from the chest tightness. Patient stated she took some Alka Seltzer for the tightness in chest and was fine afterwards. Patient also stated she was told to take Tylenol muscle and ache pain as needed. CPA was relieved she was feeling much better and asked if there was any assistance needed at this time. Patient verbalized "No thank you, I am doing fine". Patient voiced and confirmed she is doing much better with no other issues. Notified PharmD Birdena Crandall.   Kelly Poole, [place] Assistant [phone]   I have reviewed the care management and care coordination activities outlined in this encounter and I am certifying that I agree with the content of this note.   Junita Push. Kenton Kingfisher PharmD, [facility] [phone]

## 2020-12-06 ENCOUNTER — Telehealth: Payer: Medicare HMO | Admitting: General Practice

## 2020-12-06 ENCOUNTER — Ambulatory Visit: Payer: Self-pay | Admitting: General Practice

## 2020-12-06 DIAGNOSIS — N1832 Chronic kidney disease, stage 3b: Secondary | ICD-10-CM

## 2020-12-06 DIAGNOSIS — I10 Essential (primary) hypertension: Secondary | ICD-10-CM

## 2020-12-06 DIAGNOSIS — E7800 Pure hypercholesterolemia, unspecified: Secondary | ICD-10-CM

## 2020-12-06 DIAGNOSIS — M1711 Unilateral primary osteoarthritis, right knee: Secondary | ICD-10-CM

## 2020-12-06 NOTE — Patient Instructions (Signed)
Visit Information  Goals Addressed              This Visit's Progress   .  RNCM-Pt-"I take my blood pressure but it is the wrist cuff" (pt-stated)        CARE PLAN ENTRY (see longtitudinal plan of care for additional care plan information)  Current Barriers:  . Chronic Disease Management support, education, and care coordination needs related to HTN, HLD, and Chronic arthritis pain (knee)  Clinical Goal(s) related to HTN, HLD, and Chronic arthritis pain (knee):  Over the next 120 days, patient will:  . Work with the care management team to address educational, disease management, and care coordination needs  . Begin or continue self health monitoring activities as directed today Measure and record blood pressure 2 times per week and adhere to a heart healthy/renal diet  . Call provider office for new or worsened signs and symptoms Blood pressure findings outside established parameters and New or worsened symptom related to HLD, chronic arthritis pain and other chronic conditions. . Call care management team with questions or concerns . Verbalize basic understanding of patient centered plan of care established today  Interventions related to HTN, HLD, and Chronic arthritis pain (knee):  Marland Kitchen Evaluation of current treatment plans and patient's adherence to plan as established by provider.  The patient is compliant with the plan of care. Saw pcp recently. 10-04-2020: Saw the specialist recently and blood pressure was 132/79 and heart rate 78.  The patient will follow up with the specialist again on 11-04-2020.  12-06-2020: The patient saw the specialist and got a good report. The patient will see specialist again on 12-11-2020.  Sees pcp on 02-17-2021.  . Assessed patient understanding of disease states.  The patient has a good understanding of her conditions. 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 Talked about taking care of self and making time for her to have time for herself.  12-17--2021: Review of self care and sleep patterns. The patient says she is working on sleep time. The patient has to get up early to go to her mothers house to take over caregiver responsibilities. She lots of times will lay back down and rest. Education on taking care of self and getting adequate amounts of sleep. The patient feels she is getting adequate rest.  Eating well and still taking care of her elderly mother.  . Assessed patient's education and care coordination needs. Education given to the patient on the normal range for blood pressures. The patient states she has it pretty well managed right now. The patient has a wrist cuff and admits lots of times that it may be off. Denies headaches when her pressures are high. Education to be sent by MyChart and EMMI.  11-04-2020: the patient states her blood pressure continues to be controlled. She is thankful for this. Education and support given on the benefits of taking blood pressure regularly and recording. The patient states her most recent blood pressure was 128/72.12-06-2020: Review of SDOH. No new needs at this time. . Provided disease specific education to patient: Educational material to be sent to the patient on dietary restrictions and other requested information from the patient. 08-09-2020: The patient is compliant with Heart Healthy diet. Wants to learn more about renal diet restrictions and protection of kidneys. 08-21-2020: The patient wanted reassurance that she was making the right dietary choices and limiting her sodium. She wants to do everything she can to prevent further damage to her kidneys  and heart. 09-10-2020: The patient wanted information on Tylenol. Information provided to the patient. She ask about increasing her iron medications but advised the patient to talk to the pcp before taking any new medications or more than prescribed. Gave the patient alternative methods of helping with building up of iron. 12-06-2020: The patient is  following a heart healthy renal diet. Education and support given. The patient had called early in the week because she got her last booster for COVID and her arm has been hurting and aching. She also had chills. She does not have the chills anymore and her arm is getting better each day. The patient advised to call the office for worsening condition or unresolved side effects from the covid booster.  Nash Dimmer with appropriate clinical care team members regarding patient needs.  Will ask the CCM pharmacist to assist with medication review and medications that may be nephrotoxic. The patient is eager to learn what she can to protect her health and well being.  . Evaluation of upcoming appointments. The patient sees nephrology on 12-11-2020 and will follow up with the pcp on 02-17-2021.  Will continue to monitor.   Patient Self Care Activities related toHTN, HLD, and Chronic arthritis pain (knee):  Marland Kitchen Patient is unable to independently self-manage chronic health conditions  Please see past updates related to this goal by clicking on the "Past Updates" button in the selected goal      .  RNCM: Pt-"I am really concerned about my kidneys" (pt-stated)        CARE PLAN ENTRY (see longitudinal plan of care for additional care plan information)  Current Barriers:  Marland Kitchen Knowledge Deficits related to kidney function and medication of hydrochlorothiazide decreasing her kidney function . Care Coordination needs related to educational needs in a patient with CKD III (disease states) . Chronic Disease Management support and education needs related to renal function and abnormal labs related to medications and the patient wanting to understand her conditions better  Nurse Case Manager Clinical Goal(s):  Marland Kitchen Over the next 120 days, patient will verbalize understanding of plan for CKDIII . Over the next 120 days, patient will work with [facility] [place] team and pcp to address needs related to kidney function and  understanding CKD III . Over the next 120 days, patient will attend all scheduled medical appointments: pcp follow up 07/30/2020 with lab work follow up on 05/16/2020 for repeat labs related to kidney function . Over the next 120 days, patient will verbalize basic understanding of CKDIII disease process and self health management plan as evidenced by understanding kidney function, how life style changes and diet can help in maintaining healthy kidney function. . Over the next 120 days, patient will demonstrate understanding of rationale for each prescribed medication as evidenced by compliance and review of medications with RNCM and pharmacist  . Over the next 120 days, patient will work with CM team pharmacist to understand BETA Blockers better and help with questions she has related to the medications she takes.   Interventions:  . Inter-disciplinary care team collaboration (see longitudinal plan of care) . Evaluation of current treatment plan related to CKDIII and patient's adherence to plan as established by provider. The patient is concerned about her kidney function and wants to know all she can so she can make sure she is doing her part keep her kidneys from delcining further. Education and support given. Sees kidney specialist again in November.  12-06-2020: The patient sees the kidney specialist again  on 12-11-2020. States she is doing well.  . Advised patient to follow recommendations of pcp with decreasing the dose of Hydrochlorothiazide and having follow up labs on 05/16/2020.  08-09-2020: the patient saw the pcp recently and had labwork done. The patient states that she is eager to talk to the specialist. Review of what can cause renal function decline. The patient given resources. 10-04-2020: The patient saw the specialist and he told her to drink lemon and lime juices and water.  The patient provided education on flavored waters and also juice information. The patient got a good report from the  kidney specialist. She is watching her diet carefully too. She says her iron level is still low but she continues to take the iron pills.  She thinks a lot of it has to do with the schedule she is on and taking care of her elderly mother.  12-06-2020: The patient is continuing to follow dietary restrictions. Denies issues with her diet at this time. Mindful of nephrotoxic medications and foods.  . Provided education to patient re: renal function, CKDIII, and dietary restrictions for renal diet.  08-09-2020: The patient wants to learn all she can about improving her dietary habits and eating things that are good for her. Agrees to get information by [facility] and my chart. The patient called back briefly and ask about pomegranate juice for kidney health. Reviewed information with the patient. 08-21-2020: The patient had called and left a VM asking for a call back. The patient called back. She is concerned about cramps if she eats no salt in her diet. Education and support given. Explained 1 teaspoon of salt is equal to 2300 mg of sodium therefore she would need to use a little less than a teaspoon of salt in a days time. She states she does not use this much at all. She does use other herbs, spices and a seasoning called "complete seasoning".  The patient wants to make sure she is doing her part to not harm her kidneys any further. She also is drinking pomegranate juice and she says she is having good urinary habits. Praised the patient for good habits and changing her lifestyle habits to improve her health and well being. 12-06-2020: The patient has been doing well. Has received 200.00 gift care to help with her expenses for food needs. Will continue to monitor for SDOH.  The patient calls RNCM when she has needs.  . Reviewed medications with patient and discussed compliance. The patient ask question about the Hydrochlorothiazide and if decreasing the dose what does a BETA blocker mean?  Education provided and will do a  pharmacy referral.  08-09-2020: Will ask for pharmacist to reach out to the patient to discuss medications that may be nephrotoxic. 12-06-2020: The patient is compliant with medications. States the new medication pcp put her on makes her go to the bathroom a lot. The patient is trying to monitor her cholesterol. Education on discussing fish oil with the provider and pharmacist on next appointment. Education and support given. Will continue to monitor.  . Discussed plans with patient for ongoing care management follow up and provided patient with direct contact information for care management team . Pharmacy referral for education and support on possibly having the start taking a BETA blocker if the reduced dose of hydrochlorothiazide does not work.  Also for medications that may be nephrotoxic . Evaluation of activity level. The patient is going to the gym and working out. She is being proactive in  her care. Praised for accomplishments.   Patient Self Care Activities:  . Patient verbalizes understanding of plan to work with CCM team to help her meet her health and wellness needs . Self administers medications as prescribed . Attends all scheduled provider appointments . Calls provider office for new concerns or questions . Unable to independently manage CKDIII  Please see past updates related to this goal by clicking on the "Past Updates" button in the selected goal         The patient verbalized understanding of instructions, educational materials, and care plan provided today and declined offer to receive copy of patient instructions, educational materials, and care plan.   Telephone follow up appointment with care management team member scheduled for: 02-07-2021 at 0900 am  Noreene Larsson RN, MSN, [facility] Mobile: [phone]

## 2020-12-06 NOTE — Chronic Care Management (AMB) (Signed)
Chronic Care Management   Follow Up Note   12/06/2020 Name: Kelly Poole MRN: [id number] DOB: 24-Jun-1951  Referred by: Venita Lick, NP Reason for referral : Chronic Care Management (RNCM Follow up call for Chronic Disease Management and Care Coordination needs)   Kelly Poole is a 69 y.o. year old female who is a primary care patient of Cannady, Kelly Faster, NP. The CCM team was consulted for assistance with chronic disease management and care coordination needs.    Review of patient status, including review of consultants reports, relevant laboratory and other test results, and collaboration with appropriate care team members and the patient's provider was performed as part of comprehensive patient evaluation and provision of chronic care management services.    SDOH (Social Determinants of Health) assessments performed: Yes See Care Plan activities for detailed interventions related to [facility]     Outpatient Encounter Medications as of 12/06/2020  Medication Sig Note  . Ascorbic Acid (VITAMIN C) 1000 MG tablet Take 1,000 mg by mouth daily.   Marland Kitchen aspirin 81 MG tablet Take 81 mg by mouth daily.   Marland Kitchen atorvastatin (LIPITOR) 10 MG tablet Take 1 tablet (10 mg total) by mouth daily.   . Cetirizine HCl (ALLERGY RELIEF) 10 MG CAPS Take by mouth.   . Cholecalciferol (VITAMIN D-3) 125 MCG (5000 UT) TABS Take 1 tablet by mouth daily. Takes every other day   . ferrous sulfate 325 (65 FE) MG tablet Take 1 tablet (325 mg total) by mouth daily with breakfast.   . Garlic 2637 MG CAPS Take 1,000 mg by mouth daily. 10/12/2019: Doesn't take regular  . hydrochlorothiazide (HYDRODIURIL) 25 MG tablet Take 1 tablet (25 mg total) by mouth daily.   Marland Kitchen ketoconazole (NIZORAL) 2 % cream Apply 1 application topically daily. (Patient not taking: Reported on 10/14/2020)   . losartan (COZAAR) 100 MG tablet Take 1 tablet (100 mg total) by mouth daily.   . Multiple Vitamin (MULTIVITAMINS PO) Take by mouth  daily.   . mupirocin ointment (BACTROBAN) 2 % Apply 1 application topically 2 (two) times daily. (Patient not taking: Reported on 10/16/2020)   . omeprazole (PRILOSEC) 20 MG capsule Take 1 capsule (20 mg total) by mouth daily.   . TURMERIC PO Take 1 capsule by mouth daily.   . vitamin B-12 (CYANOCOBALAMIN) 1000 MCG tablet Take 1 tablet (1,000 mcg total) by mouth daily.   . vitamin E 100 UNIT capsule Take 100 Units by mouth daily.    No facility-administered encounter medications on file as of 12/06/2020.     Objective:  BP Readings from Last 3 Encounters:  09/04/20 132/79  07/30/20 114/75  04/22/20 122/82    Goals Addressed              This Visit's Progress   .  RNCM-Pt-"I take my blood pressure but it is the wrist cuff" (pt-stated)        CARE PLAN ENTRY (see longtitudinal plan of care for additional care plan information)  Current Barriers:  . Chronic Disease Management support, education, and care coordination needs related to HTN, HLD, and Chronic arthritis pain (knee)  Clinical Goal(s) related to HTN, HLD, and Chronic arthritis pain (knee):  Over the next 120 days, patient will:  . Work with the care management team to address educational, disease management, and care coordination needs  . Begin or continue self health monitoring activities as directed today Measure and record blood pressure 2 times per week and adhere to a  heart healthy/renal diet  . Call provider office for new or worsened signs and symptoms Blood pressure findings outside established parameters and New or worsened symptom related to HLD, chronic arthritis pain and other chronic conditions. . Call care management team with questions or concerns . Verbalize basic understanding of patient centered plan of care established today  Interventions related to HTN, HLD, and Chronic arthritis pain (knee):  Marland Kitchen Evaluation of current treatment plans and patient's adherence to plan as established by provider.  The  patient is compliant with the plan of care. Saw pcp recently. 10-04-2020: Saw the specialist recently and blood pressure was 132/79 and heart rate 78.  The patient will follow up with the specialist again on 11-04-2020.  12-06-2020: The patient saw the specialist and got a good report. The patient will see specialist again on 12-11-2020.  Sees pcp on 02-17-2021.  . Assessed patient understanding of disease states.  The patient has a good understanding of her conditions. 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 Talked about taking care of self and making time for her to have time for herself. 12-17--2021: Review of self care and sleep patterns. The patient says she is working on sleep time. The patient has to get up early to go to her mothers house to take over caregiver responsibilities. She lots of times will lay back down and rest. Education on taking care of self and getting adequate amounts of sleep. The patient feels she is getting adequate rest.  Eating well and still taking care of her elderly mother.  . Assessed patient's education and care coordination needs. Education given to the patient on the normal range for blood pressures. The patient states she has it pretty well managed right now. The patient has a wrist cuff and admits lots of times that it may be off. Denies headaches when her pressures are high. Education to be sent by MyChart and EMMI.  11-04-2020: the patient states her blood pressure continues to be controlled. She is thankful for this. Education and support given on the benefits of taking blood pressure regularly and recording. The patient states her most recent blood pressure was 128/72.12-06-2020: Review of SDOH. No new needs at this time. . Provided disease specific education to patient: Educational material to be sent to the patient on dietary restrictions and other requested information from the patient. 08-09-2020: The patient is compliant with Heart Healthy diet. Wants to  learn more about renal diet restrictions and protection of kidneys. 08-21-2020: The patient wanted reassurance that she was making the right dietary choices and limiting her sodium. She wants to do everything she can to prevent further damage to her kidneys and heart. 09-10-2020: The patient wanted information on Tylenol. Information provided to the patient. She ask about increasing her iron medications but advised the patient to talk to the pcp before taking any new medications or more than prescribed. Gave the patient alternative methods of helping with building up of iron. 12-06-2020: The patient is following a heart healthy renal diet. Education and support given. The patient had called early in the week because she got her last booster for COVID and her arm has been hurting and aching. She also had chills. She does not have the chills anymore and her arm is getting better each day. The patient advised to call the office for worsening condition or unresolved side effects from the covid booster.  Nash Dimmer with appropriate clinical care team members regarding patient needs.  Will ask  the CCM pharmacist to assist with medication review and medications that may be nephrotoxic. The patient is eager to learn what she can to protect her health and well being.  . Evaluation of upcoming appointments. The patient sees nephrology on 12-11-2020 and will follow up with the pcp on 02-17-2021.  Will continue to monitor.   Patient Self Care Activities related toHTN, HLD, and Chronic arthritis pain (knee):  Marland Kitchen Patient is unable to independently self-manage chronic health conditions  Please see past updates related to this goal by clicking on the "Past Updates" button in the selected goal      .  RNCM: Pt-"I am really concerned about my kidneys" (pt-stated)        CARE PLAN ENTRY (see longitudinal plan of care for additional care plan information)  Current Barriers:  Marland Kitchen Knowledge Deficits related to kidney function  and medication of hydrochlorothiazide decreasing her kidney function . Care Coordination needs related to educational needs in a patient with CKD III (disease states) . Chronic Disease Management support and education needs related to renal function and abnormal labs related to medications and the patient wanting to understand her conditions better  Nurse Case Manager Clinical Goal(s):  Marland Kitchen Over the next 120 days, patient will verbalize understanding of plan for CKDIII . Over the next 120 days, patient will work with [facility] [place] team and pcp to address needs related to kidney function and understanding CKD III . Over the next 120 days, patient will attend all scheduled medical appointments: pcp follow up 07/30/2020 with lab work follow up on 05/16/2020 for repeat labs related to kidney function . Over the next 120 days, patient will verbalize basic understanding of CKDIII disease process and self health management plan as evidenced by understanding kidney function, how life style changes and diet can help in maintaining healthy kidney function. . Over the next 120 days, patient will demonstrate understanding of rationale for each prescribed medication as evidenced by compliance and review of medications with RNCM and pharmacist  . Over the next 120 days, patient will work with CM team pharmacist to understand BETA Blockers better and help with questions she has related to the medications she takes.   Interventions:  . Inter-disciplinary care team collaboration (see longitudinal plan of care) . Evaluation of current treatment plan related to CKDIII and patient's adherence to plan as established by provider. The patient is concerned about her kidney function and wants to know all she can so she can make sure she is doing her part keep her kidneys from delcining further. Education and support given. Sees kidney specialist again in November.  12-06-2020: The patient sees the kidney specialist again on  12-11-2020. States she is doing well.  . Advised patient to follow recommendations of pcp with decreasing the dose of Hydrochlorothiazide and having follow up labs on 05/16/2020.  08-09-2020: the patient saw the pcp recently and had labwork done. The patient states that she is eager to talk to the specialist. Review of what can cause renal function decline. The patient given resources. 10-04-2020: The patient saw the specialist and he told her to drink lemon and lime juices and water.  The patient provided education on flavored waters and also juice information. The patient got a good report from the kidney specialist. She is watching her diet carefully too. She says her iron level is still low but she continues to take the iron pills.  She thinks a lot of it has to do with the schedule  she is on and taking care of her elderly mother.  12-06-2020: The patient is continuing to follow dietary restrictions. Denies issues with her diet at this time. Mindful of nephrotoxic medications and foods.  . Provided education to patient re: renal function, CKDIII, and dietary restrictions for renal diet.  08-09-2020: The patient wants to learn all she can about improving her dietary habits and eating things that are good for her. Agrees to get information by [facility] and my chart. The patient called back briefly and ask about pomegranate juice for kidney health. Reviewed information with the patient. 08-21-2020: The patient had called and left a VM asking for a call back. The patient called back. She is concerned about cramps if she eats no salt in her diet. Education and support given. Explained 1 teaspoon of salt is equal to 2300 mg of sodium therefore she would need to use a little less than a teaspoon of salt in a days time. She states she does not use this much at all. She does use other herbs, spices and a seasoning called "complete seasoning".  The patient wants to make sure she is doing her part to not harm her kidneys any  further. She also is drinking pomegranate juice and she says she is having good urinary habits. Praised the patient for good habits and changing her lifestyle habits to improve her health and well being. 12-06-2020: The patient has been doing well. Has received 200.00 gift care to help with her expenses for food needs. Will continue to monitor for SDOH.  The patient calls RNCM when she has needs.  . Reviewed medications with patient and discussed compliance. The patient ask question about the Hydrochlorothiazide and if decreasing the dose what does a BETA blocker mean?  Education provided and will do a pharmacy referral.  08-09-2020: Will ask for pharmacist to reach out to the patient to discuss medications that may be nephrotoxic. 12-06-2020: The patient is compliant with medications. States the new medication pcp put her on makes her go to the bathroom a lot. The patient is trying to monitor her cholesterol. Education on discussing fish oil with the provider and pharmacist on next appointment. Education and support given. Will continue to monitor.  . Discussed plans with patient for ongoing care management follow up and provided patient with direct contact information for care management team . Pharmacy referral for education and support on possibly having the start taking a BETA blocker if the reduced dose of hydrochlorothiazide does not work.  Also for medications that may be nephrotoxic . Evaluation of activity level. The patient is going to the gym and working out. She is being proactive in her care. Praised for accomplishments.   Patient Self Care Activities:  . Patient verbalizes understanding of plan to work with CCM team to help her meet her health and wellness needs . Self administers medications as prescribed . Attends all scheduled provider appointments . Calls provider office for new concerns or questions . Unable to independently manage CKDIII  Please see past updates related to this goal  by clicking on the "Past Updates" button in the selected goal           Plan:   Telephone follow up appointment with care management team member scheduled for: 02-07-2021 at 0900 am   Noreene Larsson RN, MSN, [facility] Mobile: [phone]

## 2020-12-10 ENCOUNTER — Other Ambulatory Visit: Payer: Self-pay

## 2020-12-10 ENCOUNTER — Ambulatory Visit (INDEPENDENT_AMBULATORY_CARE_PROVIDER_SITE_OTHER): Payer: Medicare HMO | Admitting: Family Medicine

## 2020-12-10 ENCOUNTER — Encounter: Payer: Self-pay | Admitting: Family Medicine

## 2020-12-10 DIAGNOSIS — H9201 Otalgia, right ear: Secondary | ICD-10-CM | POA: Diagnosis not present

## 2020-12-10 DIAGNOSIS — H9209 Otalgia, unspecified ear: Secondary | ICD-10-CM | POA: Insufficient documentation

## 2020-12-10 MED ORDER — FLUTICASONE PROPIONATE 50 MCG/ACT NA SUSP
2.0000 | Freq: Every day | NASAL | 0 refills | Status: DC
Start: 2020-12-10 — End: 2022-03-23

## 2020-12-10 NOTE — Patient Instructions (Signed)
It was great to see you!  Our plans for today:  - Try the nasal spray daily. - If you are still having trouble in a few weeks, come back to see [place].   Take care and seek immediate care sooner if you develop any concerns.   Dr. Ky Barban

## 2020-12-10 NOTE — Progress Notes (Signed)
   SUBJECTIVE:   CHIEF COMPLAINT / HPI:   Past Medical History:  Diagnosis Date  . Breast cancer ([place]) 1991   left breast mastectomy. No chemo or rad tx  . GERD (gastroesophageal reflux disease)   . Heart murmur   . High cholesterol   . Hypertension    EAR PAIN Duration: since yesterday Involved ear(s): right Severity:  intermittent  Quality:  sharp Fever: no Otorrhea: no Upper respiratory infection symptoms: cough Pruritus: sometimes Hearing loss: no Water immersion no Using Q-tips: yes Recurrent otitis media: no Status: fluctuating Treatments attempted: tylenol   OBJECTIVE:   BP (!) 158/92   Pulse 81   Temp 98.7 F (37.1 C)   Wt 140 lb (63.5 kg) Comment: Patient reported  SpO2 98%   BMI 31.39 kg/m   Gen: well appearing, in NAD HEENT: TMs visible and clear bilaterally without bulging, purulence, erythema. No irritation to external canal noted. No external lesions or rashes apparent. No tenderness over mastoid process. Oropharynx clear.  ASSESSMENT/PLAN:   Ear pain Unclear etiology with benign exam. No findings to suggest acute infection, shingles, tumor. Will trial nasal CCS for potential eustachian tube dysfunction. F/u if no better in a few weeks.    Myles Gip, DO

## 2020-12-10 NOTE — Assessment & Plan Note (Signed)
Unclear etiology with benign exam. No findings to suggest acute infection, shingles, tumor. Will trial nasal CCS for potential eustachian tube dysfunction. F/u if no better in a few weeks.

## 2020-12-11 ENCOUNTER — Telehealth: Payer: Self-pay | Admitting: Nurse Practitioner

## 2020-12-11 NOTE — Telephone Encounter (Signed)
Pt saw Dr Ky Barban yesterday and states she is still having ear pain. Pt would like to know if the dr will prescribe abx to help clear this up.  [place] ([place]), [place] - [address] Phone:  [phone]  Fax:  [phone]

## 2020-12-11 NOTE — Telephone Encounter (Signed)
Patient did not have evidence of infection on exam yesterday and does not require an antibiotic. She should use the nasal steroid spray like we discussed and come back to the clinic in a few weeks if she is still having difficulties.

## 2020-12-11 NOTE — Telephone Encounter (Signed)
Patient notified

## 2020-12-19 DIAGNOSIS — N2581 Secondary hyperparathyroidism of renal origin: Secondary | ICD-10-CM | POA: Diagnosis not present

## 2020-12-19 DIAGNOSIS — I129 Hypertensive chronic kidney disease with stage 1 through stage 4 chronic kidney disease, or unspecified chronic kidney disease: Secondary | ICD-10-CM | POA: Diagnosis not present

## 2020-12-19 DIAGNOSIS — N1831 Chronic kidney disease, stage 3a: Secondary | ICD-10-CM | POA: Diagnosis not present

## 2020-12-19 DIAGNOSIS — D631 Anemia in chronic kidney disease: Secondary | ICD-10-CM | POA: Diagnosis not present

## 2020-12-23 ENCOUNTER — Emergency Department: Payer: Medicare HMO

## 2020-12-23 ENCOUNTER — Telehealth: Payer: Medicare HMO

## 2020-12-23 ENCOUNTER — Encounter: Payer: Self-pay | Admitting: Nurse Practitioner

## 2020-12-23 ENCOUNTER — Other Ambulatory Visit: Payer: Self-pay

## 2020-12-23 ENCOUNTER — Inpatient Hospital Stay
Admission: EM | Admit: 2020-12-23 | Discharge: 2020-12-26 | DRG: 101 | Disposition: A | Discharge status: Home / Self Care | Payer: Medicare HMO | Attending: Hospitalist | Admitting: Hospitalist

## 2020-12-23 DIAGNOSIS — Z7982 Long term (current) use of aspirin: Secondary | ICD-10-CM | POA: Diagnosis not present

## 2020-12-23 DIAGNOSIS — E785 Hyperlipidemia, unspecified: Secondary | ICD-10-CM | POA: Diagnosis present

## 2020-12-23 DIAGNOSIS — R9389 Abnormal findings on diagnostic imaging of other specified body structures: Secondary | ICD-10-CM | POA: Diagnosis present

## 2020-12-23 DIAGNOSIS — I10 Essential (primary) hypertension: Secondary | ICD-10-CM | POA: Diagnosis present

## 2020-12-23 DIAGNOSIS — Z20822 Contact with and (suspected) exposure to covid-19: Secondary | ICD-10-CM | POA: Diagnosis present

## 2020-12-23 DIAGNOSIS — G9349 Other encephalopathy: Secondary | ICD-10-CM | POA: Diagnosis not present

## 2020-12-23 DIAGNOSIS — Z823 Family history of stroke: Secondary | ICD-10-CM

## 2020-12-23 DIAGNOSIS — R4182 Altered mental status, unspecified: Secondary | ICD-10-CM | POA: Diagnosis not present

## 2020-12-23 DIAGNOSIS — I129 Hypertensive chronic kidney disease with stage 1 through stage 4 chronic kidney disease, or unspecified chronic kidney disease: Secondary | ICD-10-CM | POA: Diagnosis present

## 2020-12-23 DIAGNOSIS — R011 Cardiac murmur, unspecified: Secondary | ICD-10-CM | POA: Diagnosis present

## 2020-12-23 DIAGNOSIS — N179 Acute kidney failure, unspecified: Secondary | ICD-10-CM | POA: Diagnosis not present

## 2020-12-23 DIAGNOSIS — N183 Chronic kidney disease, stage 3 unspecified: Secondary | ICD-10-CM | POA: Diagnosis present

## 2020-12-23 DIAGNOSIS — Z79899 Other long term (current) drug therapy: Secondary | ICD-10-CM | POA: Diagnosis not present

## 2020-12-23 DIAGNOSIS — R569 Unspecified convulsions: Secondary | ICD-10-CM | POA: Diagnosis not present

## 2020-12-23 DIAGNOSIS — E669 Obesity, unspecified: Secondary | ICD-10-CM | POA: Diagnosis present

## 2020-12-23 DIAGNOSIS — D631 Anemia in chronic kidney disease: Secondary | ICD-10-CM | POA: Diagnosis not present

## 2020-12-23 DIAGNOSIS — G935 Compression of brain: Secondary | ICD-10-CM | POA: Diagnosis not present

## 2020-12-23 DIAGNOSIS — Z87898 Personal history of other specified conditions: Secondary | ICD-10-CM

## 2020-12-23 DIAGNOSIS — R4781 Slurred speech: Secondary | ICD-10-CM | POA: Diagnosis not present

## 2020-12-23 DIAGNOSIS — Z833 Family history of diabetes mellitus: Secondary | ICD-10-CM | POA: Diagnosis not present

## 2020-12-23 DIAGNOSIS — Z888 Allergy status to other drugs, medicaments and biological substances status: Secondary | ICD-10-CM | POA: Diagnosis not present

## 2020-12-23 DIAGNOSIS — M6282 Rhabdomyolysis: Secondary | ICD-10-CM | POA: Diagnosis not present

## 2020-12-23 DIAGNOSIS — Z9012 Acquired absence of left breast and nipple: Secondary | ICD-10-CM | POA: Diagnosis not present

## 2020-12-23 DIAGNOSIS — Z8349 Family history of other endocrine, nutritional and metabolic diseases: Secondary | ICD-10-CM

## 2020-12-23 DIAGNOSIS — N2581 Secondary hyperparathyroidism of renal origin: Secondary | ICD-10-CM | POA: Insufficient documentation

## 2020-12-23 DIAGNOSIS — G9340 Encephalopathy, unspecified: Secondary | ICD-10-CM | POA: Diagnosis present

## 2020-12-23 DIAGNOSIS — N1832 Chronic kidney disease, stage 3b: Secondary | ICD-10-CM | POA: Diagnosis present

## 2020-12-23 DIAGNOSIS — K219 Gastro-esophageal reflux disease without esophagitis: Secondary | ICD-10-CM | POA: Diagnosis present

## 2020-12-23 DIAGNOSIS — Z803 Family history of malignant neoplasm of breast: Secondary | ICD-10-CM

## 2020-12-23 DIAGNOSIS — Z6832 Body mass index (BMI) 32.0-32.9, adult: Secondary | ICD-10-CM | POA: Diagnosis not present

## 2020-12-23 DIAGNOSIS — Z853 Personal history of malignant neoplasm of breast: Secondary | ICD-10-CM

## 2020-12-23 DIAGNOSIS — R4020 Unspecified coma: Secondary | ICD-10-CM | POA: Diagnosis not present

## 2020-12-23 DIAGNOSIS — R404 Transient alteration of awareness: Secondary | ICD-10-CM | POA: Diagnosis not present

## 2020-12-23 LAB — TROPONIN I (HIGH SENSITIVITY)
Troponin I (High Sensitivity): 8 ng/L (ref <18)
Troponin I (High Sensitivity): 18 ng/L — ABNORMAL HIGH (ref <18)

## 2020-12-23 LAB — URINE DRUG SCREEN, QUALITATIVE (ARMC ONLY)
Amphetamines, Ur Screen: NOT DETECTED
Barbiturates, Ur Screen: NOT DETECTED
Benzodiazepine, Ur Scrn: POSITIVE — AB
Cannabinoid 50 Ng, Ur ~~LOC~~: NOT DETECTED
Cocaine Metabolite,Ur ~~LOC~~: NOT DETECTED
MDMA (Ecstasy)Ur Screen: NOT DETECTED
Methadone Scn, Ur: NOT DETECTED
Opiate, Ur Screen: NOT DETECTED
Phencyclidine (PCP) Ur S: NOT DETECTED
Tricyclic, Ur Screen: NOT DETECTED

## 2020-12-23 LAB — COMPREHENSIVE METABOLIC PANEL
ALT: 56 U/L — ABNORMAL HIGH (ref 0–44)
AST: 56 U/L — ABNORMAL HIGH (ref 15–41)
Albumin: 4 g/dL (ref 3.5–5.0)
Alkaline Phosphatase: 87 U/L (ref 38–126)
Anion gap: 11 (ref 5–15)
BUN: 26 mg/dL — ABNORMAL HIGH (ref 8–23)
CO2: 23 mmol/L (ref 22–32)
Calcium: 9.2 mg/dL (ref 8.9–10.3)
Chloride: 104 mmol/L (ref 98–111)
Creatinine, Ser: 1.76 mg/dL — ABNORMAL HIGH (ref 0.44–1.00)
GFR, Estimated: 31 mL/min — ABNORMAL LOW (ref >60)
Glucose, Bld: 113 mg/dL — ABNORMAL HIGH (ref 70–99)
Potassium: 4 mmol/L (ref 3.5–5.1)
Sodium: 138 mmol/L (ref 135–145)
Total Bilirubin: 0.6 mg/dL (ref 0.3–1.2)
Total Protein: 7.9 g/dL (ref 6.5–8.1)

## 2020-12-23 LAB — CBC WITH DIFFERENTIAL/PLATELET
Abs Immature Granulocytes: 0.03 10*3/uL (ref 0.00–0.07)
Basophils Absolute: 0 10*3/uL (ref 0.0–0.1)
Basophils Relative: 0 %
Eosinophils Absolute: 0.1 10*3/uL (ref 0.0–0.5)
Eosinophils Relative: 1 %
HCT: 28 % — ABNORMAL LOW (ref 36.0–46.0)
Hemoglobin: 9 g/dL — ABNORMAL LOW (ref 12.0–15.0)
Immature Granulocytes: 0 %
Lymphocytes Relative: 10 %
Lymphs Abs: 1 10*3/uL (ref 0.7–4.0)
MCH: 30.6 pg (ref 26.0–34.0)
MCHC: 32.1 g/dL (ref 30.0–36.0)
MCV: 95.2 fL (ref 80.0–100.0)
Monocytes Absolute: 0.6 10*3/uL (ref 0.1–1.0)
Monocytes Relative: 6 %
Neutro Abs: 8.1 10*3/uL — ABNORMAL HIGH (ref 1.7–7.7)
Neutrophils Relative %: 83 %
Platelets: 269 10*3/uL (ref 150–400)
RBC: 2.94 MIL/uL — ABNORMAL LOW (ref 3.87–5.11)
RDW: 12.7 % (ref 11.5–15.5)
WBC: 9.8 10*3/uL (ref 4.0–10.5)
nRBC: 0 % (ref 0.0–0.2)

## 2020-12-23 LAB — RESP PANEL BY RT-PCR (FLU A&B, COVID) ARPGX2
Influenza A by PCR: NEGATIVE
Influenza B by PCR: NEGATIVE
SARS Coronavirus 2 by RT PCR: NEGATIVE

## 2020-12-23 LAB — CK: Total CK: 1132 U/L — ABNORMAL HIGH (ref 38–234)

## 2020-12-23 LAB — ETHANOL: Alcohol, Ethyl (B): <10 mg/dL (ref <10)

## 2020-12-23 IMAGING — CT CT HEAD W/O CM
3 series · 15 of 44 positions shown, 18 images · non-contrast
Comparison: No pertinent prior exams available for comparison.

CLINICAL DATA: Seizure, nontraumatic. Additional history provided:
Witnessed seizure, no prior history.

EXAM:
CT HEAD WITHOUT CONTRAST
TECHNIQUE: Contiguous axial images were obtained from the base of the skull
through the vertex without intravenous contrast.

[Series 2: head wo · axial · 0.40mm/px · z∈[-122,-12]mm · 9 of 27 slices shown, 12 images]
[im 3/27  brain]
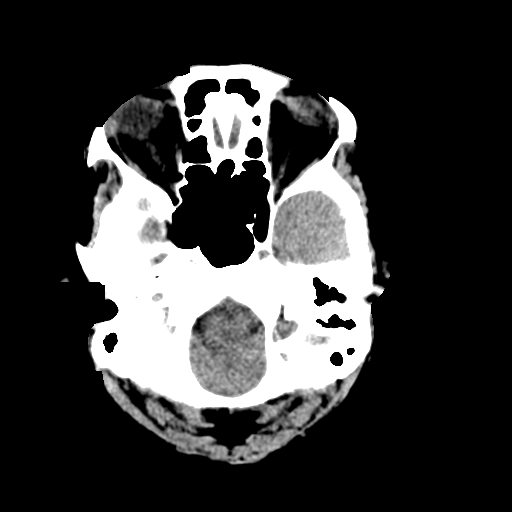
[im 3/27  bone]
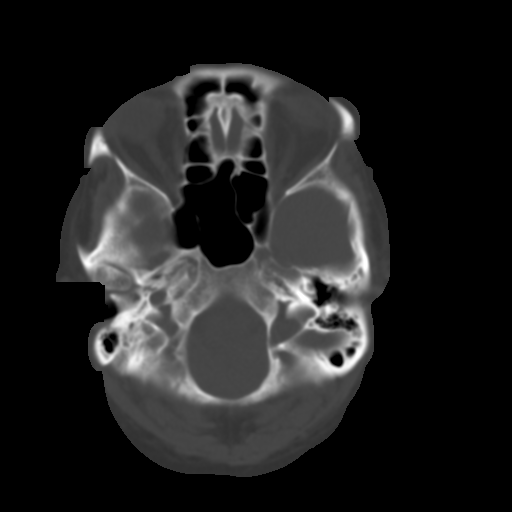
[im 6/27  brain]
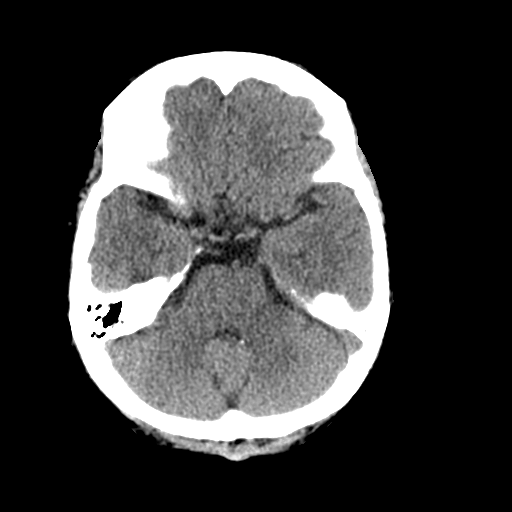
[im 8/27  brain]
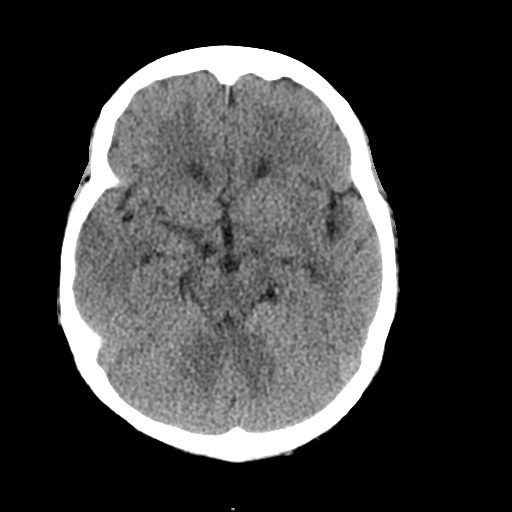
[im 11/27  brain]
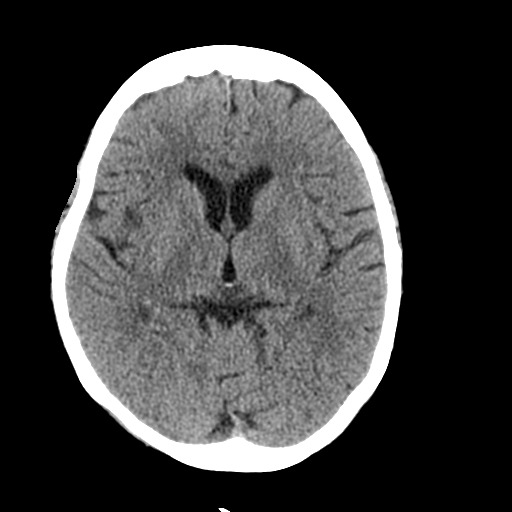
[im 14/27  brain]
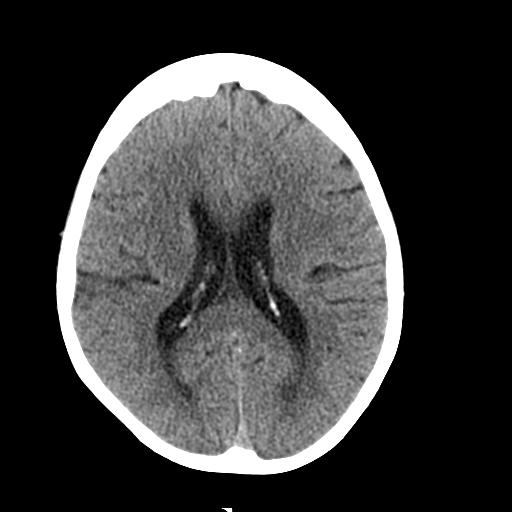
[im 14/27  bone]
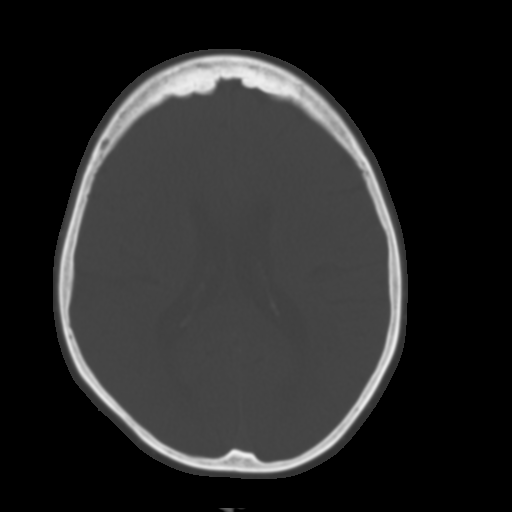
[im 17/27  brain]
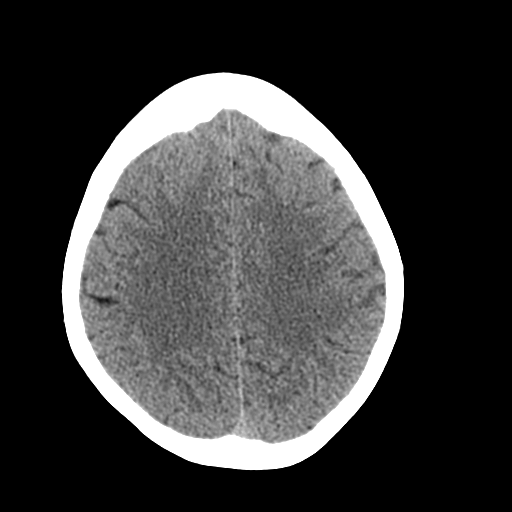
[im 20/27  brain]
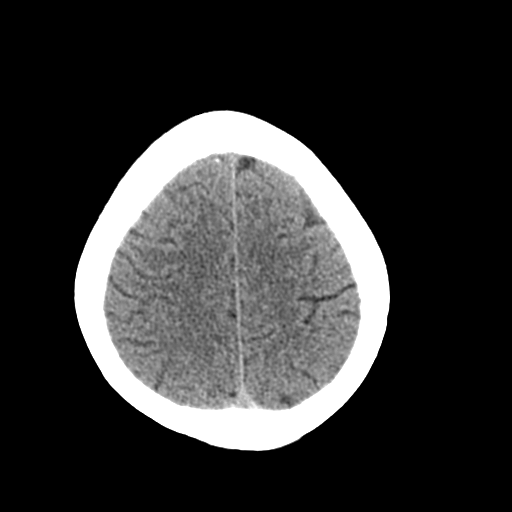
[im 22/27  brain]
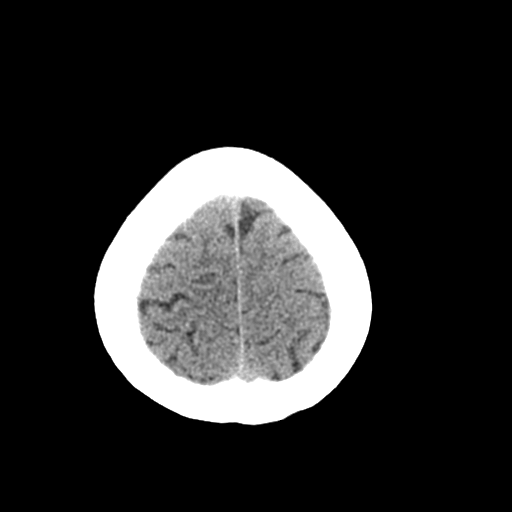
[im 25/27  brain]
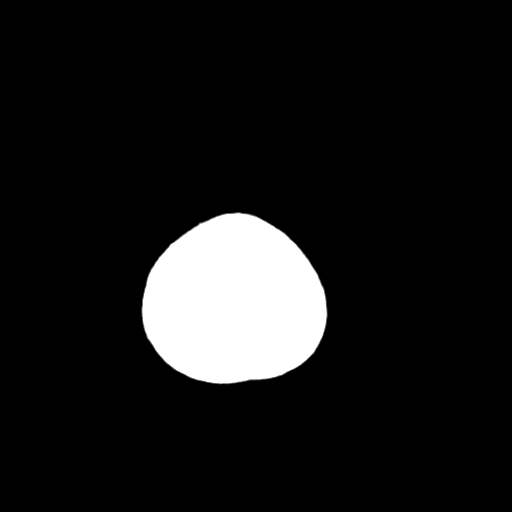
[im 25/27  bone]
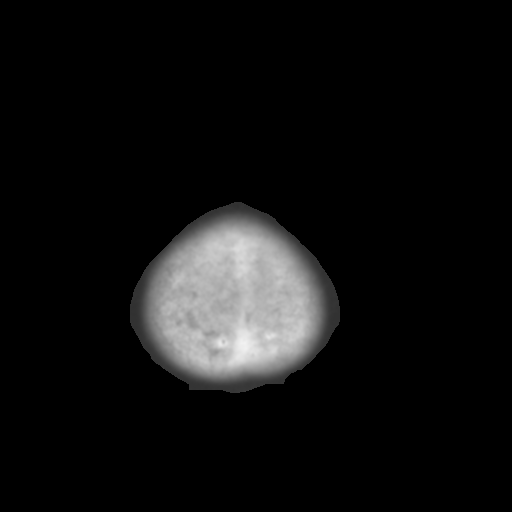

[Series 4: coronal soft tissue · coronal · 0.30mm/px · 3 of 63 slices shown]
[im 21/63  brain]
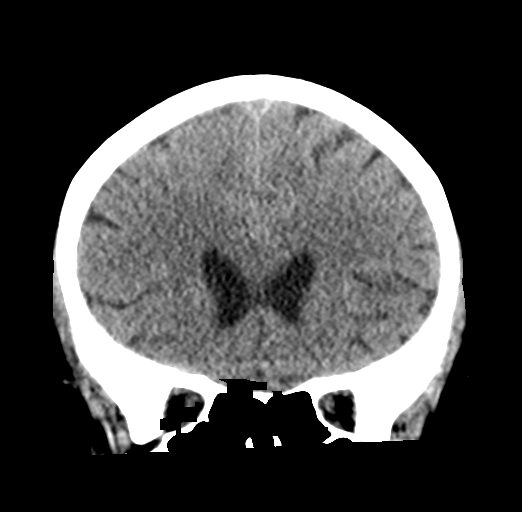
[im 28/63  brain]
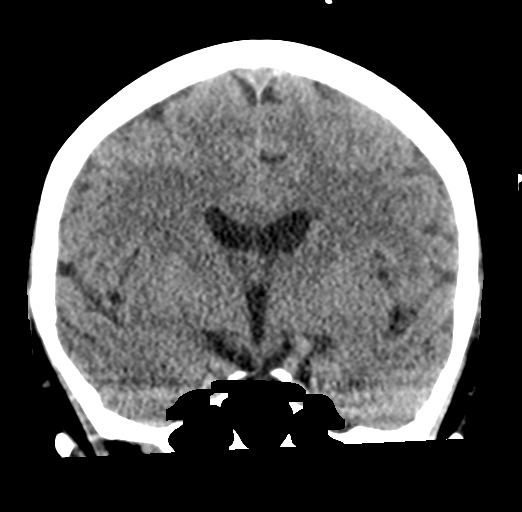
[im 35/63  brain]
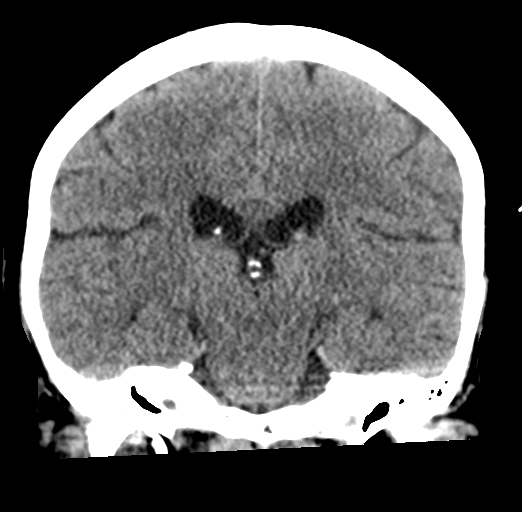

[Series 5: sagittal soft tissue · sagittal · 0.30mm/px · 3 of 53 slices shown]
[im 18/53  brain]
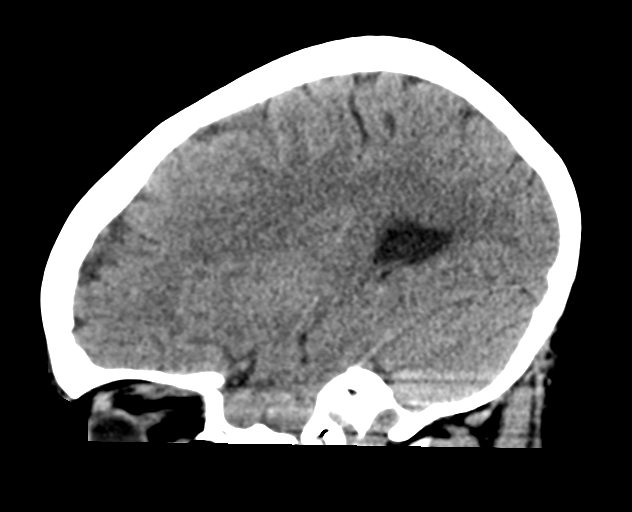
[im 27/53  brain]
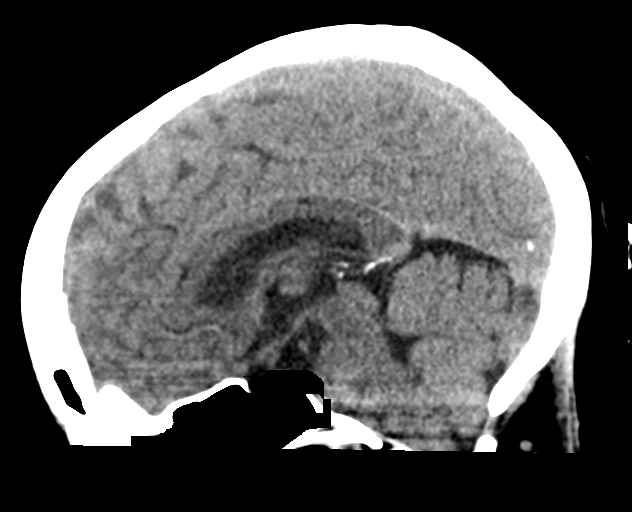
[im 35/53  brain]
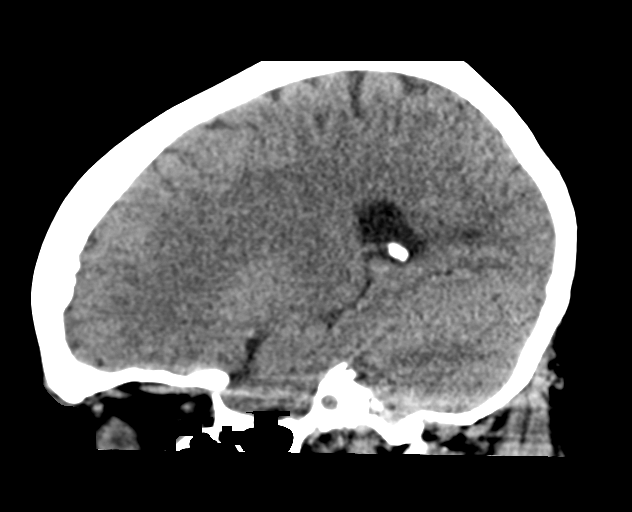

[15 of 44 positions shown; findings below may reference images not displayed]

FINDINGS: Brain:

Cerebral volume is normal for age.

There is no acute intracranial hemorrhage.

No demarcated cortical infarct.

No extra-axial fluid collection.

No evidence of intracranial mass.

No midline shift or hydrocephalus.

Partially empty sella turcica.

The cerebellar tonsils are incompletely imaged. However, the left
cerebellar tonsil appears to extend at least 8 mm below the level of
the foramen magnum and this may reflect a Chiari 1 malformation.

Vascular: No hyperdense vessel.  Atherosclerotic calcifications.

Skull: Normal. Negative for fracture or focal lesion.

Sinuses/Orbits: Visualized orbits show no acute finding. Mild
ethmoid sinus mucosal thickening.
IMPRESSION: No CT evidence of acute intracranial abnormality. Given the provided
history of first-time seizure, consider a brain MRI for further
evaluation.

The cerebellar tonsils are incompletely imaged. However, the left
cerebellar tonsil appears to extend at least 8 mm below the level of
the foramen magnum, and this may reflect a Chiari 1 malformation.
Alternatively, low-lying cerebellar tonsils may be seen in the
setting of idiopathic intracranial hypertension. Consider brain MRI
for further characterization.

## 2020-12-23 IMAGING — MR MR HEAD WO/W CM
14 of 15 series · 38 of 48 positions shown · IV contrast (gadavist)
Comparison: Head CT [DATE].

CLINICAL DATA: Seizure, abnormal neuro exam.

EXAM:
MRI HEAD WITHOUT AND WITH CONTRAST
TECHNIQUE: Multiplanar, multiecho pulse sequences of the brain and surrounding
structures were obtained without and with intravenous contrast.
CONTRAST:  6mL GADAVIST GADOBUTROL 1 MMOL/ML IV SOLN

[Series 9: T1 · sagittal · 5.0mm · 0.62mm/px · 1 of 25 slices shown]
[im 1/25]
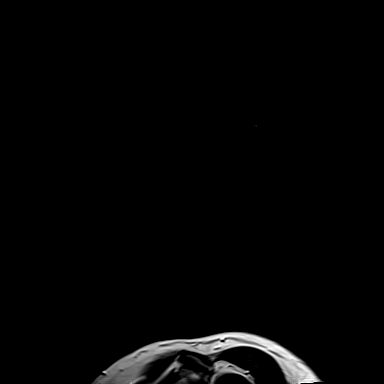

[Series 10: ax dwi_tracew · axial · 3.0mm · 0.60mm/px · z∈[-191,-49]mm · 2 of 48 slices shown]
[im 1/48]
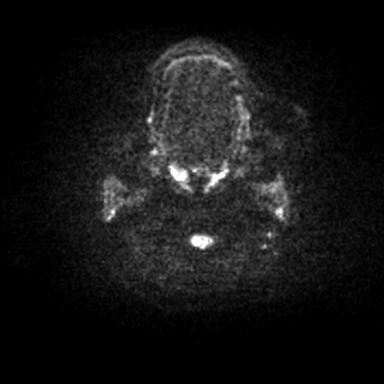
[im 48/48]
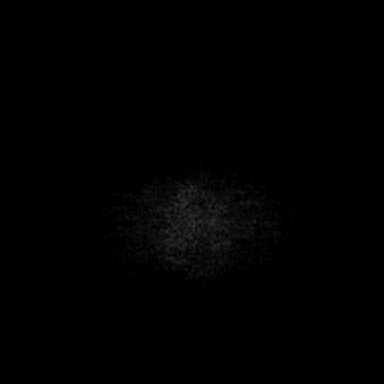

[Series 11: ax dwi_adc · axial · 3.0mm · 0.60mm/px · z∈[-191,-49]mm · 3 of 46 slices shown]
[im 1/46]
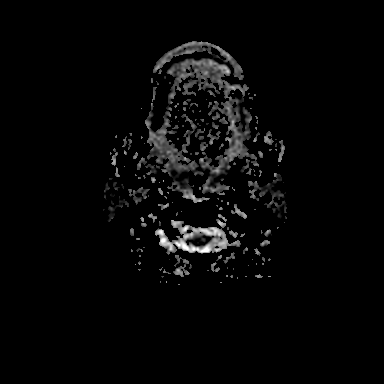
[im 23/46]
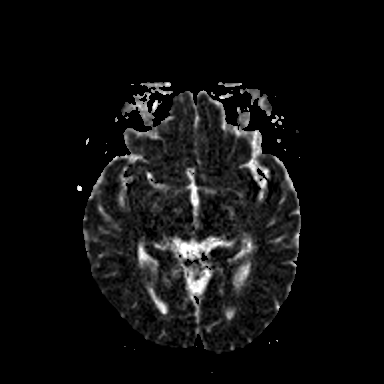
[im 46/46]
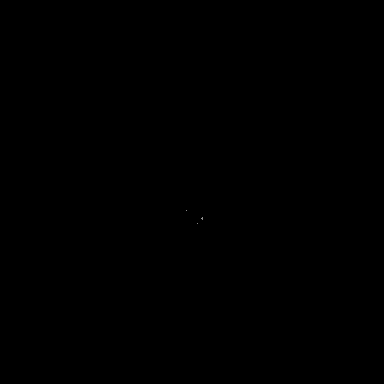

[Series 12: cor dwi_tracew · coronal · 5.0mm · 0.60mm/px · 2 of 36 slices shown]
[im 1/36]
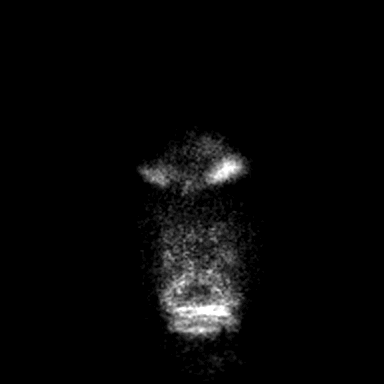
[im 36/36]
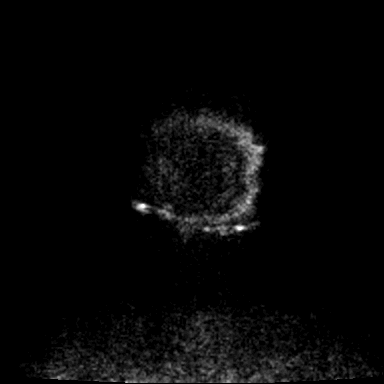

[Series 13: cor dwi_adc · coronal · 5.0mm · 0.60mm/px · 2 of 34 slices shown]
[im 1/34]
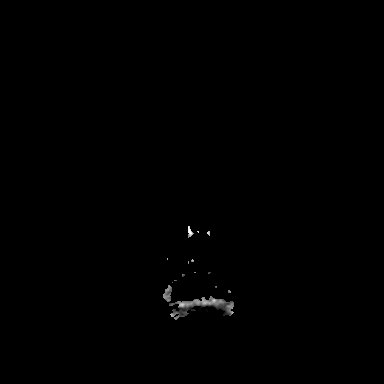
[im 34/34]
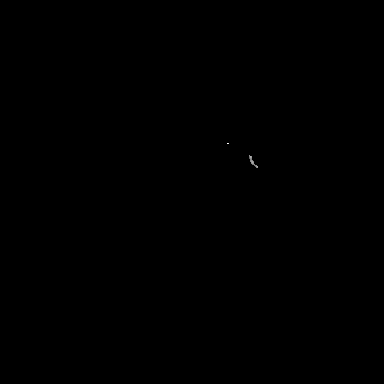

[Series 14: T2 · axial · 5.0mm · 0.53mm/px · 1 of 26 slices shown (1 of 2)]
[im 1/26]
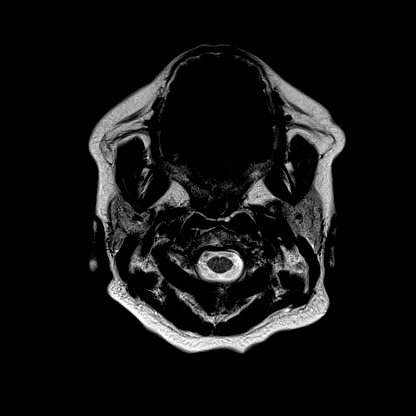

[Series 16: pha_images · axial · 3.0mm · 0.90mm/px · z∈[-202,-42]mm · 3 of 59 slices shown]
[im 1/59]
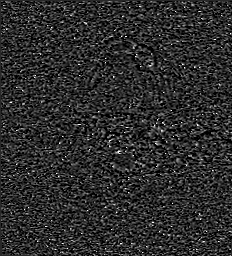
[im 30/59]
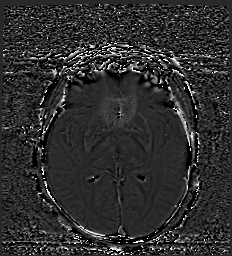
[im 59/59]
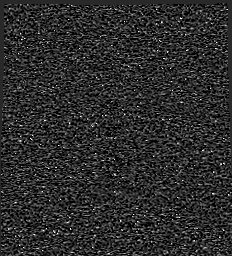

[Series 17: swi_images · axial · 3.0mm · 0.90mm/px · z∈[-202,-39]mm · 3 of 60 slices shown]
[im 1/60]
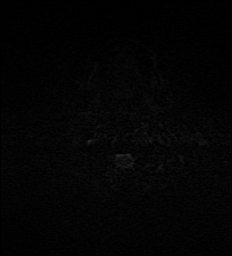
[im 30/60]
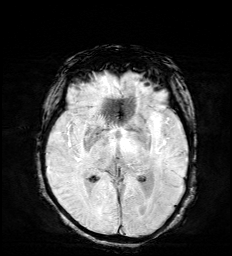
[im 60/60]
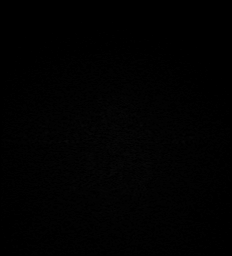

[Series 19: FLAIR · axial · 3.0mm · 0.53mm/px · z∈[-190,-41]mm · 3 of 55 slices shown (1 of 2)]
[im 1/55]
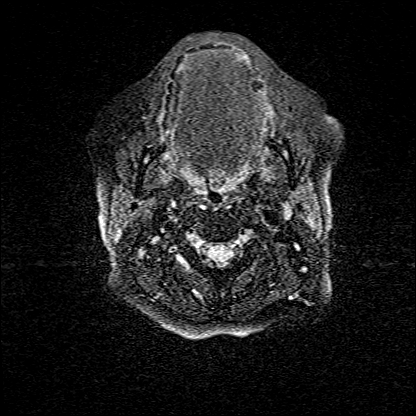
[im 28/55]
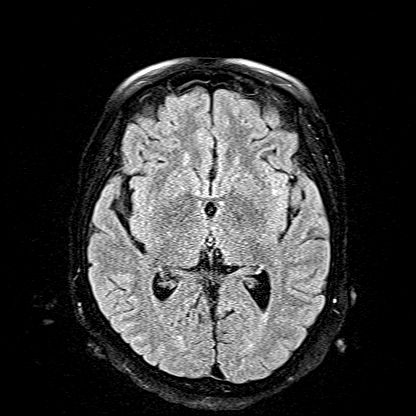
[im 55/55]
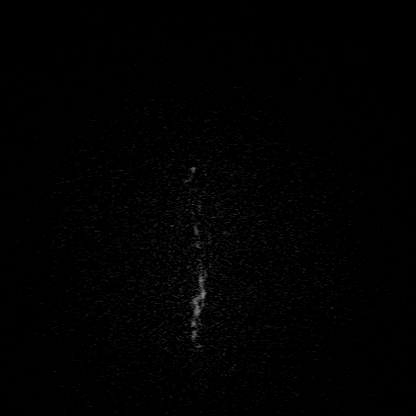

[Series 22: T2 · axial · 3.0mm · 0.47mm/px · z∈[-73,+12]mm · 2 of 35 slices shown (2 of 2)]
[im 1/35]
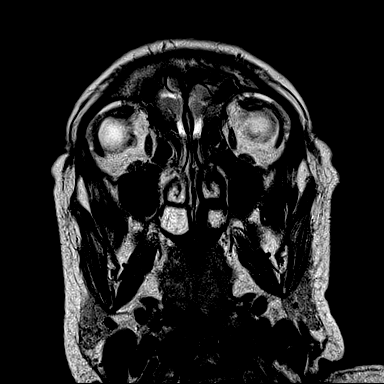
[im 35/35]
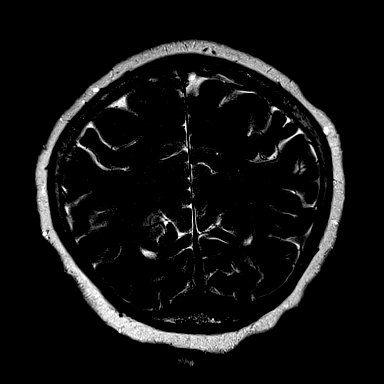

[Series 23: FLAIR · axial · 3.0mm · 0.47mm/px · z∈[-73,+12]mm · 2 of 35 slices shown (2 of 2)]
[im 1/35]
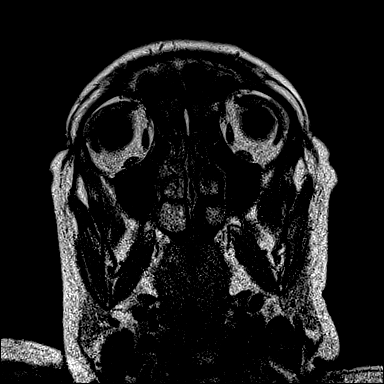
[im 35/35]
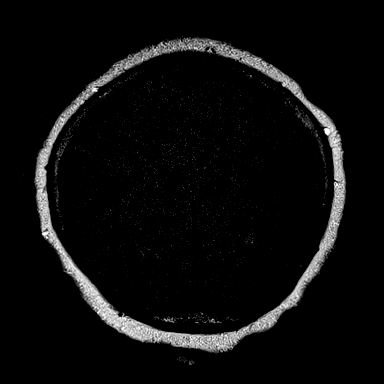

[Series 24: T1 post-contrast · axial · 1.0mm · 0.98mm/px · z∈[-202,-41]mm · 8 of 176 slices shown (1 of 2)]
[im 1/176]
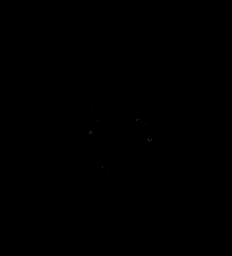
[im 20/176]
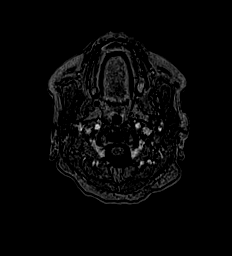
[im 59/176]
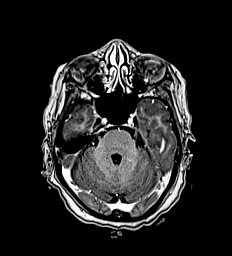
[im 78/176]
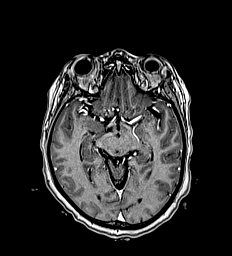
[im 98/176]
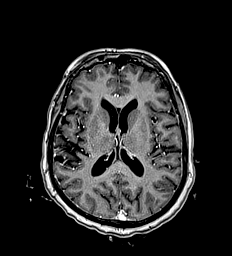
[im 117/176]
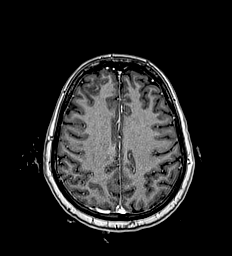
[im 156/176]
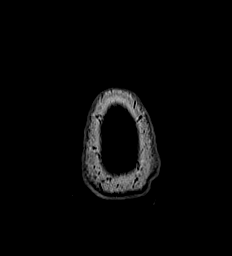
[im 176/176]
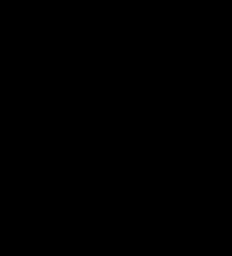

[Series 25: T1 post-contrast · coronal · 5.0mm · 0.57mm/px · 2 of 29 slices shown (2 of 2)]
[im 1/29]
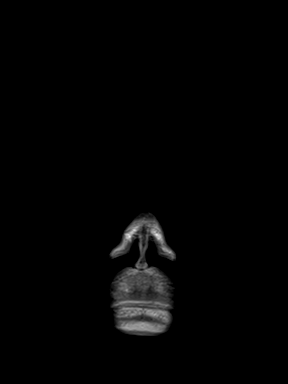
[im 29/29]
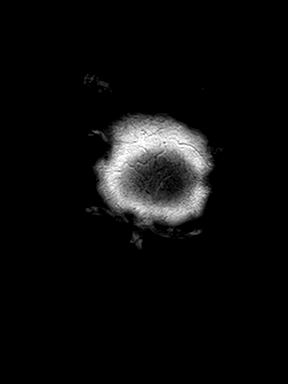

[Series 1027: cor thins rl · coronal · 3.0mm · 0.49mm/px · 4 of 107 slices shown]
[im 1/107]
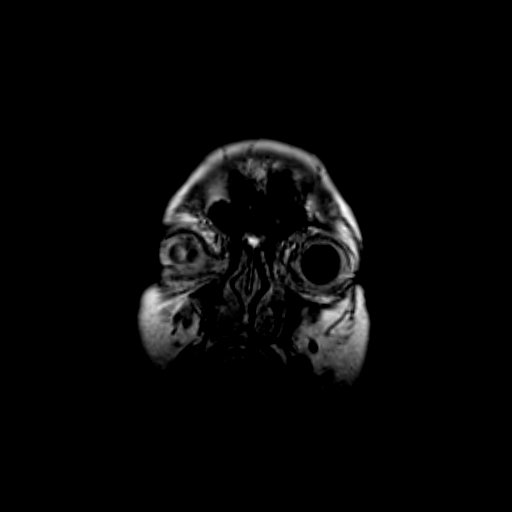
[im 22/107]
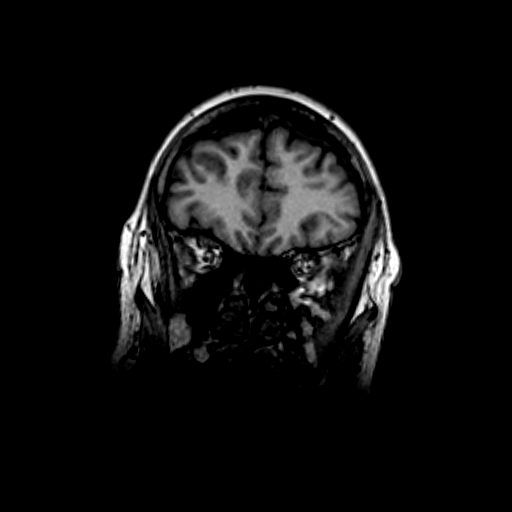
[im 43/107]
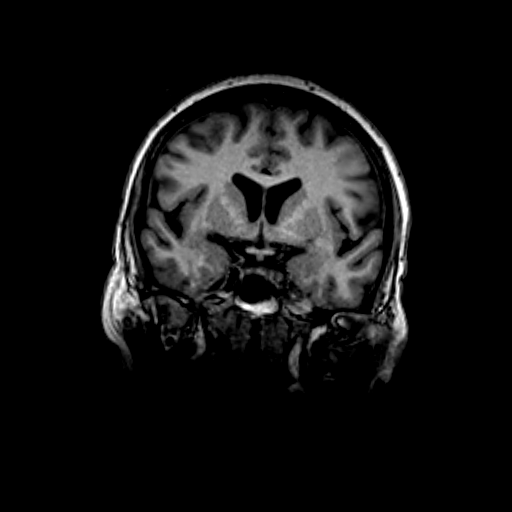
[im 64/107]
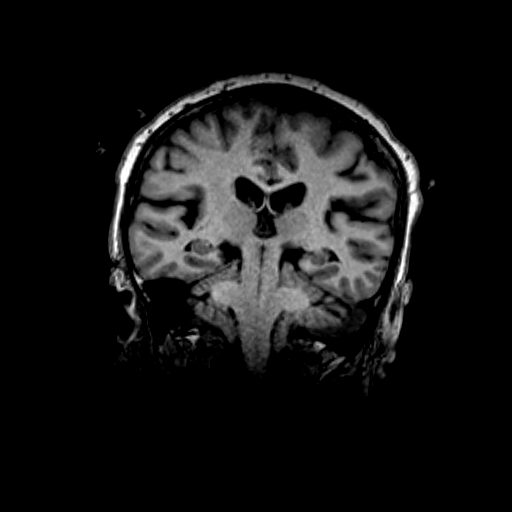

[38 of 48 positions shown; findings below may reference images not displayed]

FINDINGS: Brain: Leptomeningeal contrast enhancement within anterior right
frontal cerebral sulci with subjacent cortical/subcortical edema
without significant mass effect. Remainder of the brain parenchyma
is unremarkable.

Bilateral Chiari 1 malformation.

No acute infarct, hemorrhage or hydrocephalus.

Vascular: Normal flow voids.

Skull and upper cervical spine: Normal marrow signal.

Sinuses/Orbits: Negative.
IMPRESSION: Leptomeningeal contrast enhancement within anterior right frontal
cerebral sulci with subjacent cortical/subcortical edema without
significant mass effect. Findings may represent
infectious/inflammatory meningitis in the appropriate clinical
setting.

These results will be called to the ordering clinician or
representative by the Radiologist Assistant, and communication
documented in the PACS or [REDACTED].

## 2020-12-23 IMAGING — DX DG CHEST 1V PORT
1 series · 1 of 1 positions shown · non-contrast
Comparison: None.

CLINICAL DATA: Per ELSEVAR MAMMADOV note, eval for infiltrate. EMS called because
pt became unresponsive at caretakers house. Unknown time of episode.
EMS states pt had witnessed seizure to which they gave versed 5 mg
IV

EXAM:
PORTABLE CHEST 1 VIEW

[chest ap]
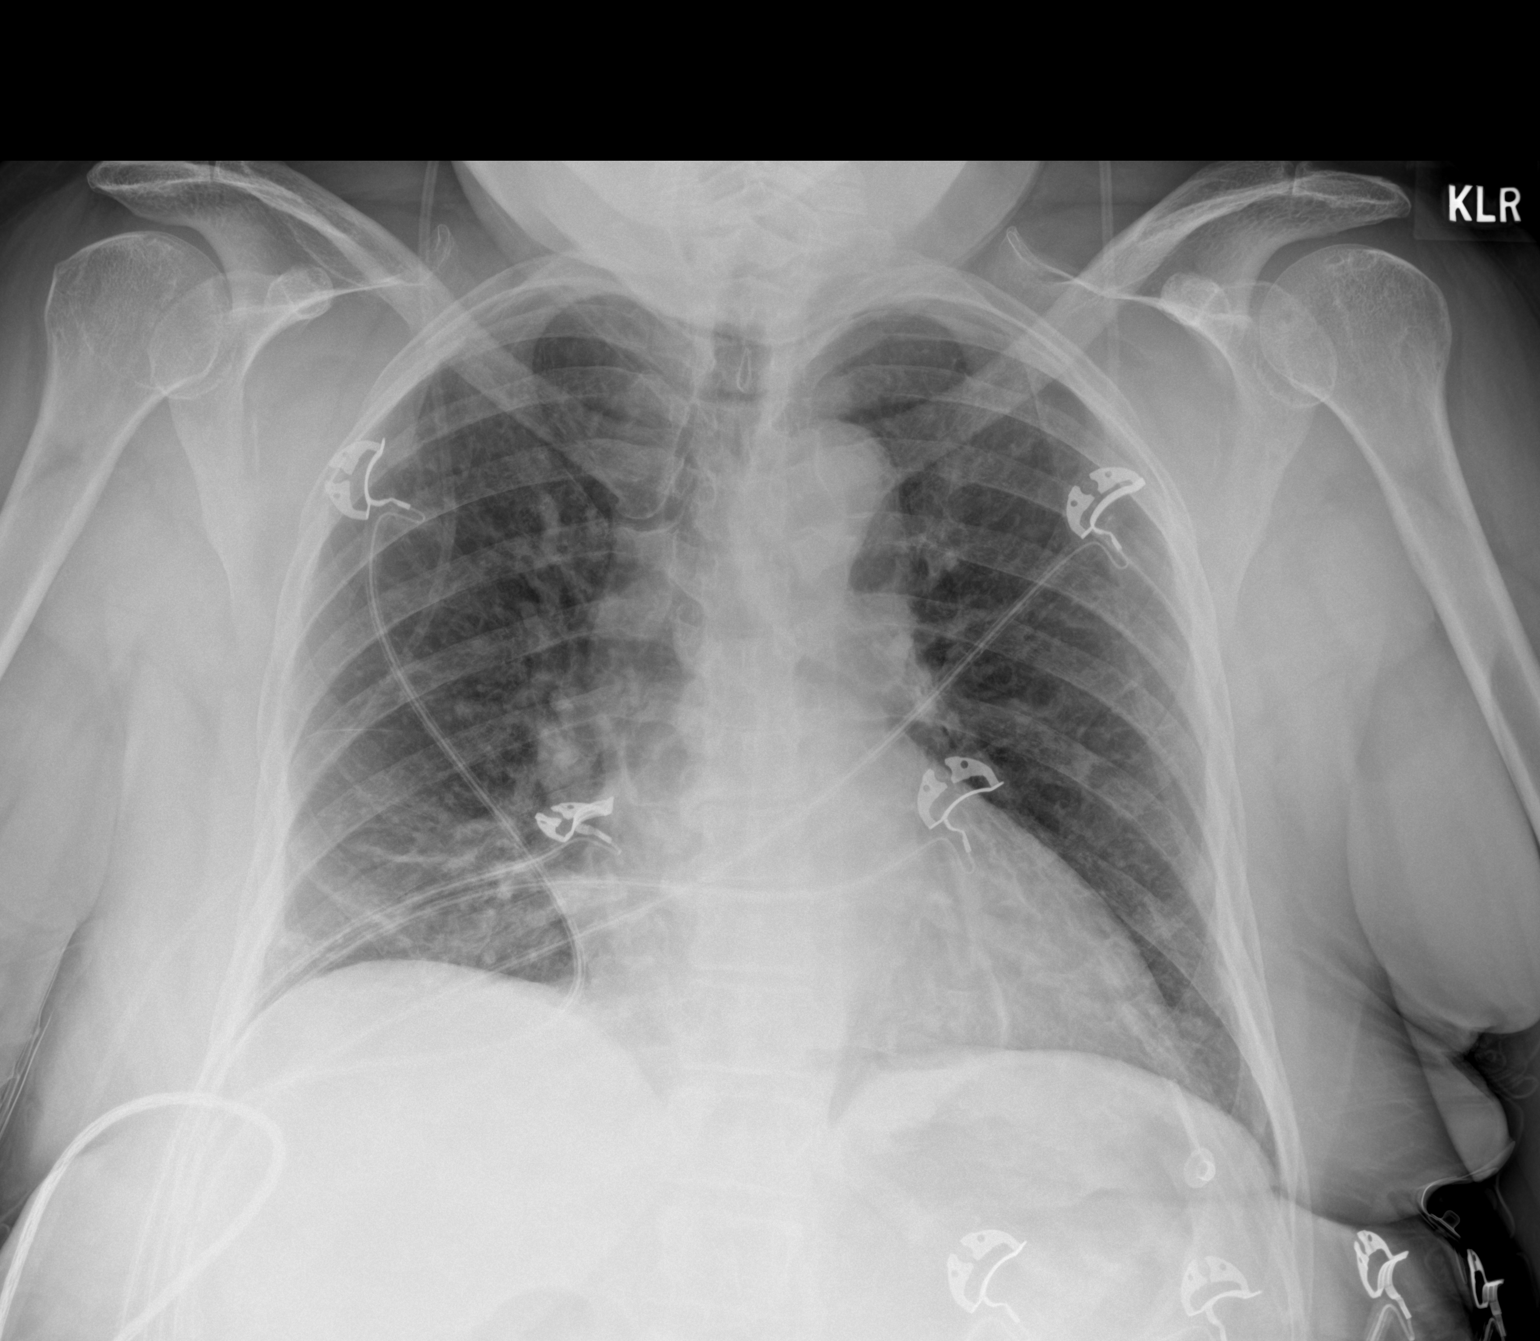

[1 of 1 positions shown; findings below may reference images not displayed]

FINDINGS: Cardiac silhouette is normal in size. No mediastinal or hilar
masses. No evidence of adenopathy.

Linear opacities noted in the lung bases consistent with scarring or
atelectasis. Lungs are otherwise clear.

No pleural effusion or pneumothorax.

Skeletal structures are grossly intact.
IMPRESSION: No active disease.

## 2020-12-23 MED ORDER — ACETAMINOPHEN 325 MG PO TABS
650.0000 mg | ORAL_TABLET | Freq: Once | ORAL | Status: AC
  Administered 2020-12-23: 650 mg via ORAL
  Filled 2020-12-23: qty 2

## 2020-12-23 MED ORDER — LEVETIRACETAM 250 MG PO TABS
250.0000 mg | ORAL_TABLET | Freq: Two times a day (BID) | ORAL | Status: DC
  Administered 2020-12-23 – 2020-12-26 (×6): 250 mg via ORAL
  Filled 2020-12-23 (×11): qty 1

## 2020-12-23 MED ORDER — GADOBUTROL 1 MMOL/ML IV SOLN
6.0000 mL | Freq: Once | INTRAVENOUS | Status: AC | PRN
  Administered 2020-12-23: 6 mL via INTRAVENOUS

## 2020-12-23 NOTE — ED Provider Notes (Signed)
[facility] Emergency Department Provider Note    Event Date/Time   First MD Initiated Contact with Patient 12/23/20 1254     (approximate)  I have reviewed the triage vital signs and the nursing notes.   HISTORY  Chief Complaint Seizures (Witnessed by EMS)    HPI Kelly Poole is a 70 y.o. female below listed past medical history presents to the ER for evaluation of shaking spell lasting several minutes.  Patient states she was conscious for this episode.  States that she was watching her family member as she is her caregiver.  EMS reported generalized tonic-clonic shaking.  Was given Versed and then they describe postictal..  Patient denies any trauma.  Denies any headache.  Never had any history of seizures.  Denies any chest pain or shortness of breath.  No fevers or chills    Past Medical History:  Diagnosis Date  . Breast cancer (HCC) 1991   left breast mastectomy. No chemo or rad tx  . GERD (gastroesophageal reflux disease)   . Heart murmur   . High cholesterol   . Hypertension    Family History  Problem Relation Age of Onset  . Stroke Mother   . Thyroid disease Mother   . Cancer Father   . Diabetes Maternal Aunt   . Breast cancer Other    Past Surgical History:  Procedure Laterality Date  . ABDOMINAL HYSTERECTOMY  1978  . MASTECTOMY Left 1991   Due to breast cancer  . TUBAL LIGATION     Patient Active Problem List   Diagnosis Date Noted  . Ear pain 12/10/2020  . Iron deficiency anemia 07/30/2020  . Hair loss 01/26/2020  . Osteoarthritis of right knee 09/07/2019  . Vasomotor symptoms due to menopause 03/24/2019  . CKD (chronic kidney disease) stage 3, GFR 30-59 ml/min (HCC) 10/05/2016  . Vitamin B12 deficiency (dietary) anemia 12/27/2015  . Hypertension 12/27/2015  . Insomnia 06/03/2015  . Thyroid goiter 06/03/2015  . HX: breast cancer 06/03/2015  . Gastroesophageal reflux disease 06/03/2015  . Hyperlipidemia 06/03/2015       Prior to Admission medications   Medication Sig Start Date End Date Taking? Authorizing Provider  Ascorbic Acid (VITAMIN C) 1000 MG tablet Take 1,000 mg by mouth daily.    [provider]  aspirin 81 MG tablet Take 81 mg by mouth daily.    [provider]  atorvastatin (LIPITOR) 10 MG tablet Take 1 tablet (10 mg total) by mouth daily. 06/25/20   Cannady, Corrie Dandy T, NP  Cholecalciferol (VITAMIN D-3) 125 MCG (5000 UT) TABS Take 1 tablet by mouth daily. Takes every other day    [provider]  ferrous sulfate 325 (65 FE) MG tablet Take 1 tablet (325 mg total) by mouth daily with breakfast. 07/30/20   Cannady, Jolene T, NP  fluticasone (FLONASE) 50 MCG/ACT nasal spray Place 2 sprays into both nostrils daily for 14 days. 12/10/20 12/24/20  Caro Laroche, DO  Garlic 1000 MG CAPS Take 1,000 mg by mouth daily.    [provider]  hydrochlorothiazide (HYDRODIURIL) 25 MG tablet Take 1 tablet (25 mg total) by mouth daily. 06/25/20   Cannady, Corrie Dandy T, NP  ketoconazole (NIZORAL) 2 % cream Apply 1 application topically daily. Patient not taking: Reported on 10/14/2020 04/11/20   Aura Dials T, NP  losartan (COZAAR) 100 MG tablet Take 1 tablet (100 mg total) by mouth daily. 06/25/20   Marjie Skiff, NP  Multiple Vitamin (MULTIVITAMINS  PO) Take by mouth daily.    [provider]  omeprazole (PRILOSEC) 20 MG capsule Take 1 capsule (20 mg total) by mouth daily. 06/21/20   Cannady, Henrine Screws T, NP  traZODone (DESYREL) 50 MG tablet trazodone 50 mg tablet  TAKE 1 2 TO 1 (ONE HALF TO ONE) TABLET BY MOUTH AT BEDTIME AS NEEDED FOR SLEEP    [provider]  TURMERIC PO Take 1 capsule by mouth daily.    [provider]  vitamin B-12 (CYANOCOBALAMIN) 1000 MCG tablet Take 1 tablet (1,000 mcg total) by mouth daily. 07/30/20   Cannady, Henrine Screws T, NP  vitamin E 100 UNIT capsule Take 100 Units by mouth daily.    [provider]     Allergies Macrobid [nitrofurantoin macrocrystal]    Social History Social History   Tobacco Use  . Smoking status: Never Smoker  . Smokeless tobacco: Never Used  Vaping Use  . Vaping Use: Never used  Substance Use Topics  . Alcohol use: No    Alcohol/week: 0.0 standard drinks  . Drug use: No    Review of Systems Patient denies headaches, rhinorrhea, blurry vision, numbness, shortness of breath, chest pain, edema, cough, abdominal pain, nausea, vomiting, diarrhea, dysuria, fevers, rashes or hallucinations unless otherwise stated above in HPI. ____________________________________________   PHYSICAL EXAM:  VITAL SIGNS: Vitals:   12/23/20 1330 12/23/20 1430  BP: (!) 151/93 (!) 134/111  Pulse: 99 94  Resp: (!) 26 17  Temp:    SpO2: 98% 97%    Constitutional: Alert and oriented.  Eyes: Conjunctivae are normal.  Head: Atraumatic. Nose: No congestion/rhinnorhea. Mouth/Throat: Mucous membranes are moist.   Neck: No stridor. Painless ROM.  Cardiovascular: Normal rate, regular rhythm. Grossly normal heart sounds.  Good peripheral circulation. Respiratory: Normal respiratory effort.  No retractions. Lungs CTAB. Gastrointestinal: Soft and nontender. No distention. No abdominal bruits. No CVA tenderness. Genitourinary:  Musculoskeletal: No lower extremity tenderness nor edema.  No joint effusions. Neurologic:  CN- intact.  No facial droop, Normal FNF.  Normal heel to shin.  Sensation intact bilaterally. Normal speech and language. No gross focal neurologic deficits are appreciated. No gait instability. Skin:  Skin is warm, dry and intact. No rash noted. Psychiatric: Mood and affect are normal. Speech and behavior are normal.  ____________________________________________   LABS (all labs ordered are listed, but only abnormal results are displayed)  Results for orders placed or performed during the hospital encounter of 12/23/20 (from the past 24 hour(s))  CBC with  Differential     Status: Abnormal   Collection Time: 12/23/20 12:56 PM  Result Value Ref Range   WBC 9.8 4.0 - 10.5 K/uL   RBC 2.94 (L) 3.87 - 5.11 MIL/uL   Hemoglobin 9.0 (L) 12.0 - 15.0 g/dL   HCT 28.0 (L) 36.0 - 46.0 %   MCV 95.2 80.0 - 100.0 fL   MCH 30.6 26.0 - 34.0 pg   MCHC 32.1 30.0 - 36.0 g/dL   RDW 12.7 11.5 - 15.5 %   Platelets 269 150 - 400 K/uL   nRBC 0.0 0.0 - 0.2 %   Neutrophils Relative % 83 %   Neutro Abs 8.1 (H) 1.7 - 7.7 K/uL   Lymphocytes Relative 10 %   Lymphs Abs 1.0 0.7 - 4.0 K/uL   Monocytes Relative 6 %   Monocytes Absolute 0.6 0.1 - 1.0 K/uL   Eosinophils Relative 1 %   Eosinophils Absolute 0.1 0.0 - 0.5 K/uL   Basophils Relative 0 %  Basophils Absolute 0.0 0.0 - 0.1 K/uL   Immature Granulocytes 0 %   Abs Immature Granulocytes 0.03 0.00 - 0.07 K/uL  Comprehensive metabolic panel     Status: Abnormal   Collection Time: 12/23/20 12:56 PM  Result Value Ref Range   Sodium 138 135 - 145 mmol/L   Potassium 4.0 3.5 - 5.1 mmol/L   Chloride 104 98 - 111 mmol/L   CO2 23 22 - 32 mmol/L   Glucose, Bld 113 (H) 70 - 99 mg/dL   BUN 26 (H) 8 - 23 mg/dL   Creatinine, Ser 1.76 (H) 0.44 - 1.00 mg/dL   Calcium 9.2 8.9 - 10.3 mg/dL   Total Protein 7.9 6.5 - 8.1 g/dL   Albumin 4.0 3.5 - 5.0 g/dL   AST 56 (H) 15 - 41 U/L   ALT 56 (H) 0 - 44 U/L   Alkaline Phosphatase 87 38 - 126 U/L   Total Bilirubin 0.6 0.3 - 1.2 mg/dL   GFR, Estimated 31 (L) >60 mL/min   Anion gap 11 5 - 15  Troponin I (High Sensitivity)     Status: None   Collection Time: 12/23/20 12:58 PM  Result Value Ref Range   Troponin I (High Sensitivity) 8 <18 ng/L   ____________________________________________  EKG My review and personal interpretation at Time: 15:15   Indication: shaking spell  Rate: 100  Rhythm: sinus Axis: normal Other: normal intervals, no stemi ____________________________________________  RADIOLOGY  I personally reviewed all radiographic images ordered to evaluate for  the above acute complaints and reviewed radiology reports and findings.  These findings were personally discussed with the patient.  Please see medical record for radiology report.  ____________________________________________   PROCEDURES  Procedure(s) performed:  Procedures    Critical Care performed: no ____________________________________________   INITIAL IMPRESSION / ASSESSMENT AND PLAN / ED COURSE  Pertinent labs & imaging results that were available during my care of the patient were reviewed by me and considered in my medical decision making (see chart for details).   DDX: Seizure, dysrhythmia, electrolyte abnormality, hypoglycemia, sepsis, pseudoseizure  Kelly Poole is a 70 y.o. who presents to the ED with presentation as described above.  Patient arrives slightly drowsy after receiving Versed.  Neuro exam is nonfocal.  Exam is otherwise reassuring she is nontoxic-appearing.  Blood work is reassuring.  EKG without dysrhythmia.  CT imaging ordered to evaluate for the but differential.  Will consult neurology  Clinical Course as of 12/23/20 1537  Mon Dec 23, 2020  1510 Patient presentation I discussed in consultation with Dr. Curly Sh[place] of neurology.  Does recommend EEG which can be completed shortly as techs are currently available has recommended MRI with and without contrast to further evaluate.  Otherwise as this is only one episode without focality no indication for initiating AED at this time.  Patient will be signed out to oncoming physician pending follow-up MRI and EEG.  If remains stable will be appropriate for outpatient follow-up. [PR]    Clinical Course User Index [PR] Merlyn Lot, MD    The patient was evaluated in Emergency Department today for the symptoms described in the history of present illness. He/she was evaluated in the context of the global COVID-19 pandemic, which necessitated consideration that the patient might be at risk for infection with  the SARS-CoV-2 virus that causes COVID-19. Institutional protocols and algorithms that pertain to the evaluation of patients at risk for COVID-19 are in a state of rapid change based on information released  by regulatory bodies including the CDC and federal and state organizations. These policies and algorithms were followed during the patient's care in the ED.  As part of my medical decision making, I reviewed the following data within the [place] notes reviewed and incorporated, Labs reviewed, notes from prior ED visits and [place] Controlled Substance Database   ____________________________________________   FINAL CLINICAL IMPRESSION(S) / ED DIAGNOSES  Final diagnoses:  Seizure-like activity ([place])      NEW MEDICATIONS STARTED DURING THIS VISIT:  New Prescriptions   No medications on file     Note:  This document was prepared using Dragon voice recognition software and may include unintentional dictation errors.    Merlyn Lot, MD 12/23/20 1537

## 2020-12-23 NOTE — ED Triage Notes (Signed)
Pt arrived via EMS for seizure.  EMS called because pt became unresponsive at caretakers house.  Unknown time of episode.  EMS states pt had witnessed seizure to which they gave versed 5 mg IV.  Pt lethargic, but can answer questions.

## 2020-12-23 NOTE — ED Notes (Signed)
Took over care of pt. Pt currently in MRI per report by previous RN. Will assess pt when pt returns to room.

## 2020-12-23 NOTE — ED Provider Notes (Addendum)
Emergency department handoff note  Care of this patient was signed out to me at the end of the previous provider shift pending an EEG and MRI with concerns for new onset seizures.  The results of these evaluations were evaluated by myself as well as neurology who feels that patient would benefit from an inpatient admission for further evaluation and management.  Given the inflammation seen on MRI, plan for LP in the morning.  Patient agrees with plan and updated.   Merwyn Katos, MD 12/23/20 2028    Merwyn Katos, MD 12/23/20 2137

## 2020-12-23 NOTE — Progress Notes (Signed)
eeg done °

## 2020-12-23 NOTE — H&P (Signed)
History and Physical    PLEASE NOTE THAT DRAGON DICTATION SOFTWARE WAS USED IN THE CONSTRUCTION OF THIS NOTE.   Kelly Poole MAY:[id number] DOB: Mar 17, 1951 DOA: 12/23/2020  PCP: Venita Lick, NP Patient coming from: home   I have personally briefly reviewed patient's old medical records in [place]  Chief Complaint: Seizures  HPI: Kelly Poole is a 70 y.o. female with medical history significant for stage IIIb chronic kidney disease with baseline creatinine 1.4-1.5, anemia of chronic kidney disease with baseline hemoglobin 9-10, hypertension, hyperlipidemia, who is admitted to [facility] on 12/23/2020 with suspected seizures after presenting from home to [facility] Emergency Department for evaluation of tonic-clonic activity.  In the context of no known history of seizures, the patient's family reportedly witnessed the patient exhibit evidence of generalized tonic-clonic activity, lasting for little over 10 minutes and prompting EMS to be called.  Reportedly when EMS arrived at the patient's home, they noticed some residual generalized tonic-clonic activity, and administered a dose of Versed in route to [facility] ED. episode was reportedly not associate with any loss of bowel or bladder function or tongue biting.  No reported preceding trauma.  Upon arrival at the emergency department, patient was noted to exhibit diminished responsiveness and somnolence.  Medical history is notable for history of stage IIIb chronic kidney disease as well as associated anemia of chronic kidney disease baseline hemoglobin 9-10.  No reported history of alcohol abuse or recreational drug abuse.      ED Course:  Vital signs in the ED were notable for the following: Tetramex 98.1; heart rate 95-1 07; blood pressure 145/91; respiratory rate 16-23; oxygen saturation 97 to 99% on room air.  Labs were notable for the following: CMP was notable for the following: Sodium 138, bicarbonate  23, BUN 26, creatinine 1.76 relative to most recent prior serum creatinine data point of 1.41 on 07/30/2020, glucose 113, calcium 9.2, and albumin 4.0.  Serum ethanol level was found to be negative.  Urinary drug screen was positive only for benzodiazepines in the setting of administration of Versed by EMS in route to the emergency department.  CBC was notable for white blood cell count of 9800, hemoglobin 9.0 relative to most recent prior value of 9.6 on 07/30/2020.  Nasopharyngeal COVID-19/influenza PCR performed in the ED this evening was found to be negative.  Chest x-ray showed no evidence of acute cardiopulmonary process.  Noncontrast CT the head showed no evidence of acute intracranial abnormality.  The emergency department physician discussed the patient's case with the on-call neurologist, Dr. Curly Shores, who has agreed to formally consult on this patient. Dr. Curly Shores recommended pursuing EEG as well as MRI of the brain this evening.  Ensuing EEG reportedly showed no significant abnormality.  MRI of the brain, however, showed evidence of left toe meningocele contrast-enhancement within the anterior right frontal cerebral sulci with the subadjacent corticosubcortical edema without significant mass-effect.  These findings were further reviewed by Dr. Curly Shores of neurology with ensuing recommendations for IR guided lumbar puncture to occur in the morning for further evaluation of these radiographic findings.  Neurology also recommended initiation of Keppra.   While in the ED, the following were administered: Keppra 250 mg p.o. x1.    Review of Systems: As per HPI otherwise 10 point review of systems negative.   Past Medical History:  Diagnosis Date   Breast cancer ([place]) 1991   left breast mastectomy. No chemo or rad tx   GERD (gastroesophageal reflux  disease)    Heart murmur    High cholesterol    Hypertension     Past Surgical History:  Procedure Laterality Date   ABDOMINAL HYSTERECTOMY   1978   MASTECTOMY Left 1991   Due to breast cancer   TUBAL LIGATION      Social History:  reports that she has never smoked. She has never used smokeless tobacco. She reports that she does not drink alcohol and does not use drugs.   Allergies  Allergen Reactions   Macrobid [Nitrofurantoin Macrocrystal] Other (See Comments)    Sore throat and irritation to throat    Family History  Problem Relation Age of Onset   Stroke Mother    Thyroid disease Mother    Cancer Father    Diabetes Maternal Aunt    Breast cancer Other      Prior to Admission medications   Medication Sig Start Date End Date Taking? Authorizing Provider  Ascorbic Acid (VITAMIN C) 1000 MG tablet Take 1,000 mg by mouth daily.    [provider]  aspirin 81 MG tablet Take 81 mg by mouth daily.    [provider]  atorvastatin (LIPITOR) 10 MG tablet Take 1 tablet (10 mg total) by mouth daily. 06/25/20   Cannady, Henrine Screws T, NP  Cholecalciferol (VITAMIN D-3) 125 MCG (5000 UT) TABS Take 1 tablet by mouth daily. Takes every other day    [provider]  ferrous sulfate 325 (65 FE) MG tablet Take 1 tablet (325 mg total) by mouth daily with breakfast. 07/30/20   Cannady, Jolene T, NP  fluticasone (FLONASE) 50 MCG/ACT nasal spray Place 2 sprays into both nostrils daily for 14 days. 12/10/20 12/24/20  Myles Gip, DO  Garlic 1583 MG CAPS Take 1,000 mg by mouth daily.    [provider]  hydrochlorothiazide (HYDRODIURIL) 25 MG tablet Take 1 tablet (25 mg total) by mouth daily. 06/25/20   Cannady, Henrine Screws T, NP  ketoconazole (NIZORAL) 2 % cream Apply 1 application topically daily. Patient not taking: Reported on 10/14/2020 04/11/20   Marnee Guarneri T, NP  losartan (COZAAR) 100 MG tablet Take 1 tablet (100 mg total) by mouth daily. 06/25/20   Marnee Guarneri T, NP  Multiple Vitamin (MULTIVITAMINS PO) Take by mouth daily.    [provider]  omeprazole (PRILOSEC) 20 MG capsule Take  1 capsule (20 mg total) by mouth daily. 06/21/20   Cannady, Henrine Screws T, NP  traZODone (DESYREL) 50 MG tablet trazodone 50 mg tablet  TAKE 1 2 TO 1 (ONE HALF TO ONE) TABLET BY MOUTH AT BEDTIME AS NEEDED FOR SLEEP    [provider]  TURMERIC PO Take 1 capsule by mouth daily.    [provider]  vitamin B-12 (CYANOCOBALAMIN) 1000 MCG tablet Take 1 tablet (1,000 mcg total) by mouth daily. 07/30/20   Cannady, Henrine Screws T, NP  vitamin E 100 UNIT capsule Take 100 Units by mouth daily.    [provider]     Objective    Physical Exam: Vitals:   12/23/20 1600 12/23/20 1630 12/23/20 1730 12/23/20 2000  BP: (!) 166/83 (!) 162/88 (!) 164/91 (!) 161/81  Pulse: (!) 103 100 (!) 102 (!) 101  Resp: (!) 23 20 (!) 23 (!) 23  Temp:      TempSrc:      SpO2: 98% 97% 99% 98%  Weight:      Height:        General: appears to be stated age; somnolent, briefly  open eyes to verbal stimuli Skin: warm, dry, no rash Head:  AT/[place] Mouth:  Oral mucosa membranes appear moist, normal dentition Neck: supple; trachea midline Heart:  RRR; did not appreciate any M/R/G Lungs: CTAB, did not appreciate any wheezes, rales, or rhonchi Abdomen: + BS; soft, ND,  Vascular: 2+ pedal pulses b/l; 2+ radial pulses b/l Extremities: no peripheral edema, no muscle wasting Neuro: In the context of patient's acute encephalopathy and associated diminished ability to follow instructions, unable to perform full neurologic assessment at this time, including unable to perform full assessment of strength, sensation, or cranial nerve evaluation.    Labs on Admission: I have personally reviewed following labs and imaging studies  CBC: Recent Labs  Lab 12/23/20 1256  WBC 9.8  NEUTROABS 8.1*  HGB 9.0*  HCT 28.0*  MCV 95.2  PLT 419   Basic Metabolic Panel: Recent Labs  Lab 12/23/20 1256  NA 138  K 4.0  CL 104  CO2 23  GLUCOSE 113*  BUN 26*  CREATININE 1.76*  CALCIUM 9.2   GFR: Estimated Creatinine  Clearance: 23.6 mL/min (A) (by C-G formula based on SCr of 1.76 mg/dL (H)). Liver Function Tests: Recent Labs  Lab 12/23/20 1256  AST 56*  ALT 56*  ALKPHOS 87  BILITOT 0.6  PROT 7.9  ALBUMIN 4.0   No results for input(s): LIPASE, AMYLASE in the last 168 hours. No results for input(s): AMMONIA in the last 168 hours. Coagulation Profile: No results for input(s): INR, PROTIME in the last 168 hours. Cardiac Enzymes: No results for input(s): CKTOTAL, CKMB, CKMBINDEX, TROPONINI in the last 168 hours. BNP (last 3 results) No results for input(s): PROBNP in the last 8760 hours. HbA1C: No results for input(s): HGBA1C in the last 72 hours. CBG: No results for input(s): GLUCAP in the last 168 hours. Lipid Profile: No results for input(s): CHOL, HDL, LDLCALC, TRIG, CHOLHDL, LDLDIRECT in the last 72 hours. Thyroid Function Tests: No results for input(s): TSH, T4TOTAL, FREET4, T3FREE, THYROIDAB in the last 72 hours. Anemia Panel: No results for input(s): VITAMINB12, FOLATE, FERRITIN, TIBC, IRON, RETICCTPCT in the last 72 hours. Urine analysis:    Component Value Date/Time   APPEARANCEUR Clear 07/14/2019 1010   GLUCOSEU Negative 07/14/2019 1010   BILIRUBINUR Negative 07/14/2019 1010   PROTEINUR Negative 07/14/2019 1010   NITRITE Negative 07/14/2019 1010   LEUKOCYTESUR 3+ (A) 07/14/2019 1010    Radiological Exams on Admission: EEG  Result Date: 12/23/2020 Lora Havens, MD     12/23/2020  5:08 PM Patient Name: TENNELLE TAFLINGER MRN: [id number] Epilepsy Attending: Lora Havens Referring Physician/Provider: Dr Merlyn Lot Date: 12/23/2020 Duration: 24.23 mins Patient history: 70yo F with GTC shaking episode. EEG to evaluate for seizure Level of alertness: Awake AEDs during EEG study: None Technical aspects: This EEG study was done with scalp electrodes positioned according to the 10-20 International system of electrode placement. Electrical activity was acquired at a sampling rate of  _0  and reviewed with a high frequency filter of _1  and a low frequency filter of _2 . EEG data were recorded continuously and digitally stored. Description: The posterior dominant rhythm consists of 8 Hz activity of moderate voltage (25-35 uV) seen predominantly in posterior head regions, asymmetric ( R<L) and reactive to eye opening and eye closing. EEG showed continuous 3 to 6 Hz theta-delta slowing in right hemisphere as well as intermittent generalized 2-_3  delta slowing. Physiologic photic driving was not seen during photic stimulation. Hyperventilation was not performed. ABNORMALITY -Continued slow,  right hemisphere - Background asymmetry, right<left IMPRESSION: This study is suggestive of cortical dysfunction in right hemisphere due to underlying structural abnormality or post-ictal state. Additionally there is mild diffuse encephalopathy, non specific etiology. No seizures or definite epileptiform discharges were seen throughout the recording. Lora Havens   CT Head Wo Contrast  Result Date: 12/23/2020 CLINICAL DATA:  Seizure, nontraumatic. Additional history provided: Witnessed seizure, no prior history. EXAM: CT HEAD WITHOUT CONTRAST TECHNIQUE: Contiguous axial images were obtained from the base of the skull through the vertex without intravenous contrast. COMPARISON:  No pertinent prior exams available for comparison. FINDINGS: Brain: Cerebral volume is normal for age. There is no acute intracranial hemorrhage. No demarcated cortical infarct. No extra-axial fluid collection. No evidence of intracranial mass. No midline shift or hydrocephalus. Partially empty sella turcica. The cerebellar tonsils are incompletely imaged. However, the left cerebellar tonsil appears to extend at least 8 mm below the level of the foramen magnum and this may reflect a Chiari 1 malformation. Vascular: No hyperdense vessel.  Atherosclerotic calcifications. Skull: Normal. Negative for fracture or focal lesion.  Sinuses/Orbits: Visualized orbits show no acute finding. Mild ethmoid sinus mucosal thickening. IMPRESSION: No CT evidence of acute intracranial abnormality. Given the provided history of first-time seizure, consider a brain MRI for further evaluation. The cerebellar tonsils are incompletely imaged. However, the left cerebellar tonsil appears to extend at least 8 mm below the level of the foramen magnum, and this may reflect a Chiari 1 malformation. Alternatively, low-lying cerebellar tonsils may be seen in the setting of idiopathic intracranial hypertension. Consider brain MRI for further characterization. Electronically Signed   By: Kellie Simmering DO   On: 12/23/2020 13:35   MR Brain W and Wo Contrast  Result Date: 12/23/2020 CLINICAL DATA:  Seizure, abnormal neuro exam. EXAM: MRI HEAD WITHOUT AND WITH CONTRAST TECHNIQUE: Multiplanar, multiecho pulse sequences of the brain and surrounding structures were obtained without and with intravenous contrast. CONTRAST:  84m GADAVIST GADOBUTROL 1 MMOL/ML IV SOLN COMPARISON:  Head CT December 24, 2019. FINDINGS: Brain: Leptomeningeal contrast enhancement within anterior right frontal cerebral sulci with subjacent cortical/subcortical edema without significant mass effect. Remainder of the brain parenchyma is unremarkable. Bilateral Chiari 1 malformation. No acute infarct, hemorrhage or hydrocephalus. Vascular: Normal flow voids. Skull and upper cervical spine: Normal marrow signal. Sinuses/Orbits: Negative. IMPRESSION: Leptomeningeal contrast enhancement within anterior right frontal cerebral sulci with subjacent cortical/subcortical edema without significant mass effect. Findings may represent infectious/inflammatory meningitis in the appropriate clinical setting. These results will be called to the ordering clinician or representative by the Radiologist Assistant, and communication documented in the PACS or [employer] Electronically Signed   By: KPedro EarlsM.D.   On: 12/23/2020 20:04   DG Chest Portable 1 View  Result Date: 12/23/2020 CLINICAL DATA:  Per MD note, eval for infiltrate. EMS called because pt became unresponsive at caretakers house. Unknown time of episode. EMS states pt had witnessed seizure to which they gave versed 5 mg IV EXAM: PORTABLE CHEST 1 VIEW COMPARISON:  None. FINDINGS: Cardiac silhouette is normal in size. No mediastinal or hilar masses. No evidence of adenopathy. Linear opacities noted in the lung bases consistent with scarring or atelectasis. Lungs are otherwise clear. No pleural effusion or pneumothorax. Skeletal structures are grossly intact. IMPRESSION: No active disease. Electronically Signed   By: DLajean ManesM.D.   On: 12/23/2020 13:34     Assessment/Plan   [place] MJAYDON SOROKAis a 70y.o. female with medical  history significant for stage IIIb chronic kidney disease with baseline creatinine 1.4-1.5, anemia of chronic kidney disease with baseline hemoglobin 9-10, hypertension, hyperlipidemia, who is admitted to [facility] on 12/23/2020 with suspected seizures after presenting from home to [facility] Emergency Department for evaluation of tonic-clonic activity.   Principal Problem:   Seizure ([place]) Active Problems:   Hypertension   CKD (chronic kidney disease) stage 3, GFR 30-59 ml/min (HCC)   Acute encephalopathy   AKI (acute kidney injury) ([place])    #) Seizures: Presentation suspected to represent seizures in the context of presenting generalized tonic-clonic activity with perceived postictal state.  This is in the context of no known history of prior seizures.  Patient's case was discussed with the on-call neurologist, Dr. Curly Shores who has agreed to consult on this patient. while EEG performed at the recommendation of neurology this evening showed no significant abnormalities, MRI of the brain showed lipomeningocele contrast-enhancement with in the anterior right frontal cerebral sulci with  subadjacent cortical subcortical edema without significant mass-effect.  Per radiology read, these findings may represent infectious versus inflammatory meningitis.  As further evaluation of these MRI findings, Dr. Curly Shores recommended IR guided lumbar puncture with associated CSF studies to occur in the morning, although he did not feel that empiric initiation of antibiotics/antiviral meningitis coverage was warranted at this time.  Of note, orders for IR guided lumbar puncture in the morning of been placed along with orders for associated CSF analysis to further evaluate the above.  Additionally, neurology recommended initiation of Keppra.  Of note, no evidence of significant contributory electrolyte abnormalities, including no evidence of hyponatremia or hypomagnesemia.  No evidence of alcohol abuse or withdrawal nor any evidence of recreational drug use, including no evidence of benzodiazepine withdrawal.  No evidence of hypoglycemia.  Additionally, MRI of the brain showed no evidence of acute ischemic infarct.   Plan: Interventional radiology guided lumbar puncture with associated CSF analysis has been ordered for the morning per the instruction of elevated neurology, as above.  Keppra initiated per neurology.  Seizure precautions.  Check serum magnesium level.  Repeat CMP in the morning.  Repeat CBC with differential in the morning.     #) Acute kidney injury superimposed on stage IIIb chronic kidney disease: Presenting creatinine noted to be 1.76, representing an increase relative to baseline range 1.4-1.5 as well as most recent prior creatinine data one 1.41 on 07/30/2020.  Of note, the patient is on losartan as an outpatient, representing potential pharmacologic exacerbation of the AKI.  Plan: Check urinalysis with microscopic evaluation for urinary casts.  Add on random urine sodium as well as random urine creatinine.  Hold home losartan for now.  Lactated Ringer's at 75 cc/h.  Repeat BMP in the  morning.  Monitor strict I's and O's and daily weights and attempt avoid nephrotoxic agents.    #) Acute encephalopathy: Somnolence and diminished responsiveness felt to be the basis of postictal state in the setting of suspected presenting acute seizures versus sedating effects from Versed administered via EMS in route to the ED.  Differential also includes infectious versus inflammatory meningitis in the context of MRI brain findings, as further described above.  Otherwise, no evidence of underlying infectious process at this time, including no evidence of pneumonia, while nasopharyngeal COVID-19/influenza PCR were found to be negative when checked in the ED this evening.  Criteria for sepsis not met at this time.  Plan: Work-up and management of seizures, as above.  LP in the morning  with associated CSF analysis, as further described above.  Check urinalysis.      #) Essential hypertension: Outpatient and hypertensive regimen includes HCTZ as well as losartan.  In the setting of current n.p.o. status in the context of acute encephalopathy, will hold home antihypertensive regimen for now.  Additionally, will hold home losartan in the setting of presenting AKI.  Plan: Antihypertensive medications for now, as above.  Close monitoring of ensuing blood pressures via routine vital signs.      #) Anemia of chronic kidney disease: Associated with baseline hemoglobin of 9-10.  Presenting labs reflect hemoglobin within this baseline range.  Plan: Repeat CBC in the morning.    DVT prophylaxis: scd's  Code Status: Full code Family Communication: none Disposition Plan: Per Rounding Team Consults called: Patient's case imaging were discussed with the on-call neurologist, Dr. Curly Shores, as further described above Admission status: Inpatient; PCU    Of note, this patient was added by me to the following Admit List/Treatment Team:  armcadmits      PLEASE NOTE THAT DRAGON DICTATION SOFTWARE WAS  USED IN THE CONSTRUCTION OF THIS NOTE.   [facility] Pager [phone] From 12PM- 12AM  Otherwise, please contact night-coverage  [email or web address] Password [email or web address]  12/23/2020, 8:50 PM

## 2020-12-23 NOTE — Procedures (Addendum)
Patient Name: Kelly Poole  MRN: [id number]  Epilepsy Attending: Charlsie Quest  Referring Physician/Provider: Dr Willy Eddy Date: 12/23/2020 Duration: 24.23 mins  Patient history: 70yo F with GTC shaking episode. EEG to evaluate for seizure  Level of alertness: Awake  AEDs during EEG study: None  Technical aspects: This EEG study was done with scalp electrodes positioned according to the 10-20 International system of electrode placement. Electrical activity was acquired at a sampling rate of 500Hz  and reviewed with a high frequency filter of 70Hz  and a low frequency filter of 1Hz . EEG data were recorded continuously and digitally stored.   Description: The posterior dominant rhythm consists of 8 Hz activity of moderate voltage (25-35 uV) seen predominantly in posterior head regions, asymmetric ( R<L) and reactive to eye opening and eye closing. EEG showed continuous 3 to 6 Hz theta-delta slowing in right hemisphere as well as intermittent generalized 2-3hz  delta slowing. Physiologic photic driving was not seen during photic stimulation. Hyperventilation was not performed.   ABNORMALITY -Continued slow, right hemisphere - Background asymmetry, right<left  IMPRESSION: This study is suggestive of cortical dysfunction in right hemisphere due to underlying structural abnormality or post-ictal state. Additionally there is mild diffuse encephalopathy, non specific etiology. No seizures or definite epileptiform discharges were seen throughout the recording.  Dyer Klug 

## 2020-12-24 ENCOUNTER — Other Ambulatory Visit: Payer: Self-pay | Admitting: Nurse Practitioner

## 2020-12-24 ENCOUNTER — Encounter: Payer: Self-pay | Admitting: Internal Medicine

## 2020-12-24 ENCOUNTER — Inpatient Hospital Stay: Payer: Medicare HMO

## 2020-12-24 DIAGNOSIS — N179 Acute kidney failure, unspecified: Secondary | ICD-10-CM | POA: Diagnosis not present

## 2020-12-24 DIAGNOSIS — G9340 Encephalopathy, unspecified: Secondary | ICD-10-CM | POA: Diagnosis present

## 2020-12-24 DIAGNOSIS — N1832 Chronic kidney disease, stage 3b: Secondary | ICD-10-CM | POA: Diagnosis not present

## 2020-12-24 LAB — URINALYSIS, COMPLETE (UACMP) WITH MICROSCOPIC
Bilirubin Urine: NEGATIVE
Glucose, UA: NEGATIVE mg/dL
Hgb urine dipstick: NEGATIVE
Ketones, ur: NEGATIVE mg/dL
Nitrite: NEGATIVE
Protein, ur: NEGATIVE mg/dL
Specific Gravity, Urine: 1.015 (ref 1.005–1.030)
pH: 5 (ref 5.0–8.0)

## 2020-12-24 LAB — CBC WITH DIFFERENTIAL/PLATELET
Abs Immature Granulocytes: 0.02 10*3/uL (ref 0.00–0.07)
Basophils Absolute: 0.1 10*3/uL (ref 0.0–0.1)
Basophils Relative: 1 %
Eosinophils Absolute: 0.1 10*3/uL (ref 0.0–0.5)
Eosinophils Relative: 1 %
HCT: 27 % — ABNORMAL LOW (ref 36.0–46.0)
Hemoglobin: 8.7 g/dL — ABNORMAL LOW (ref 12.0–15.0)
Immature Granulocytes: 0 %
Lymphocytes Relative: 20 %
Lymphs Abs: 2.1 10*3/uL (ref 0.7–4.0)
MCH: 30.5 pg (ref 26.0–34.0)
MCHC: 32.2 g/dL (ref 30.0–36.0)
MCV: 94.7 fL (ref 80.0–100.0)
Monocytes Absolute: 0.7 10*3/uL (ref 0.1–1.0)
Monocytes Relative: 7 %
Neutro Abs: 7.5 10*3/uL (ref 1.7–7.7)
Neutrophils Relative %: 71 %
Platelets: 195 10*3/uL (ref 150–400)
RBC: 2.85 MIL/uL — ABNORMAL LOW (ref 3.87–5.11)
RDW: 12.6 % (ref 11.5–15.5)
WBC: 10.4 10*3/uL (ref 4.0–10.5)
nRBC: 0 % (ref 0.0–0.2)

## 2020-12-24 LAB — CSF CELL COUNT WITH DIFFERENTIAL
Eosinophils, CSF: 0 %
Lymphs, CSF: 78 %
Monocyte-Macrophage-Spinal Fluid: 10 %
RBC Count, CSF: 0 /mm3 (ref 0–3)
Segmented Neutrophils-CSF: 12 %
Tube #: 3
WBC, CSF: 8 /mm3 — ABNORMAL HIGH (ref 0–5)

## 2020-12-24 LAB — COMPREHENSIVE METABOLIC PANEL
ALT: 42 U/L (ref 0–44)
AST: 42 U/L — ABNORMAL HIGH (ref 15–41)
Albumin: 3.6 g/dL (ref 3.5–5.0)
Alkaline Phosphatase: 78 U/L (ref 38–126)
Anion gap: 10 (ref 5–15)
BUN: 24 mg/dL — ABNORMAL HIGH (ref 8–23)
CO2: 23 mmol/L (ref 22–32)
Calcium: 9.1 mg/dL (ref 8.9–10.3)
Chloride: 103 mmol/L (ref 98–111)
Creatinine, Ser: 1.52 mg/dL — ABNORMAL HIGH (ref 0.44–1.00)
GFR, Estimated: 37 mL/min — ABNORMAL LOW (ref >60)
Glucose, Bld: 93 mg/dL (ref 70–99)
Potassium: 4.2 mmol/L (ref 3.5–5.1)
Sodium: 136 mmol/L (ref 135–145)
Total Bilirubin: 1 mg/dL (ref 0.3–1.2)
Total Protein: 7.1 g/dL (ref 6.5–8.1)

## 2020-12-24 LAB — HIV ANTIBODY (ROUTINE TESTING W REFLEX): HIV Screen 4th Generation wRfx: NONREACTIVE

## 2020-12-24 LAB — MAGNESIUM
Magnesium: 1.8 mg/dL (ref 1.7–2.4)
Magnesium: 1.6 mg/dL — ABNORMAL LOW (ref 1.7–2.4)

## 2020-12-24 LAB — SARS CORONAVIRUS 2 (TAT 6-24 HRS): SARS Coronavirus 2: NEGATIVE

## 2020-12-24 LAB — CREATININE, URINE, RANDOM: Creatinine, Urine: 102 mg/dL

## 2020-12-24 LAB — SODIUM, URINE, RANDOM: Sodium, Ur: 133 mmol/L

## 2020-12-24 IMAGING — RF DG FLUORO GUIDE SPINAL/SI JT INJ*L*
1 series · 1 of 1 positions shown · non-contrast
Comparison: none

CLINICAL DATA: Seizure activity, possible meningitis

[Series 1: fluoro_iodine 2fps_bw · 0.09mm/px · 1 of 1 slices shown]
[im 1/1]
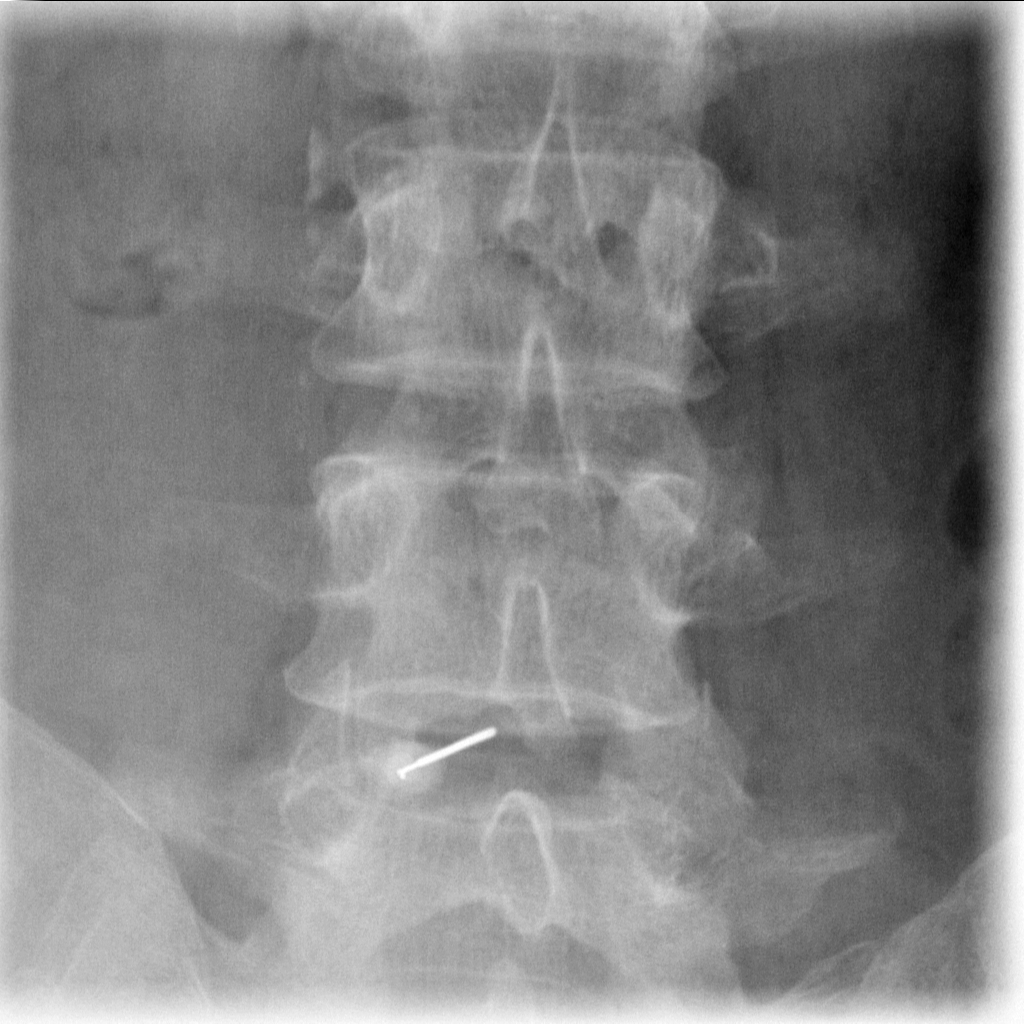

[1 of 1 positions shown; findings below may reference images not displayed]

EXAM:
DIAGNOSTIC LUMBAR PUNCTURE UNDER FLUOROSCOPIC GUIDANCE

FLUOROSCOPY TIME:  Fluoroscopy Time:  42 seconds

Radiation Exposure Index (if provided by the fluoroscopic device):
21.3 mGy

Number of Acquired Spot Images: 1

PROCEDURE:
Informed consent was obtained from the patient prior to the
procedure, including potential complications of headache, allergy,
and pain. With the patient prone, the lower back was prepped with
Betadine. 1% Lidocaine was used for local anesthesia. Lumbar
puncture was performed at the L4-5 level using a 20 gauge needle
with return of clear CSF with an opening pressure of 21 cm water.
Fifteen ml of CSF were obtained for laboratory studies. The patient
tolerated the procedure well and there were no apparent
complications.
IMPRESSION: Successful lumbar puncture under fluoroscopic guidance. Samples were
sent for laboratory evaluation.

## 2020-12-24 MED ORDER — MAGNESIUM SULFATE 2 GM/50ML IV SOLN
2.0000 g | Freq: Once | INTRAVENOUS | Status: AC
  Administered 2020-12-24: 2 g via INTRAVENOUS
  Filled 2020-12-24: qty 50

## 2020-12-24 MED ORDER — ASPIRIN EC 81 MG PO TBEC
81.0000 mg | DELAYED_RELEASE_TABLET | Freq: Every day | ORAL | Status: DC
  Administered 2020-12-24 – 2020-12-26 (×3): 81 mg via ORAL
  Filled 2020-12-24 (×3): qty 1

## 2020-12-24 MED ORDER — LOSARTAN POTASSIUM 100 MG PO TABS: 100.0000 mg | ORAL_TABLET | Freq: Every day | ORAL | 1 refills | Status: DC

## 2020-12-24 MED ORDER — PANTOPRAZOLE SODIUM 40 MG PO TBEC
40.0000 mg | DELAYED_RELEASE_TABLET | Freq: Every day | ORAL | Status: DC
  Administered 2020-12-24 – 2020-12-26 (×3): 40 mg via ORAL
  Filled 2020-12-24 (×3): qty 1

## 2020-12-24 MED ORDER — VITAMIN B-12 1000 MCG PO TABS
1000.0000 ug | ORAL_TABLET | Freq: Every day | ORAL | Status: DC
  Administered 2020-12-24 – 2020-12-26 (×3): 1000 ug via ORAL
  Filled 2020-12-24 (×4): qty 1

## 2020-12-24 MED ORDER — LACTATED RINGERS IV SOLN: INTRAVENOUS | Status: DC

## 2020-12-24 MED ORDER — ACETAMINOPHEN 325 MG PO TABS
650.0000 mg | ORAL_TABLET | ORAL | Status: DC | PRN
  Administered 2020-12-26: 650 mg via ORAL
  Filled 2020-12-24: qty 2

## 2020-12-24 MED ORDER — HYDRALAZINE HCL 25 MG PO TABS
25.0000 mg | ORAL_TABLET | Freq: Three times a day (TID) | ORAL | Status: DC
  Administered 2020-12-24 – 2020-12-25 (×4): 25 mg via ORAL
  Filled 2020-12-24 (×4): qty 1

## 2020-12-24 MED ORDER — FERROUS SULFATE 325 (65 FE) MG PO TABS
325.0000 mg | ORAL_TABLET | Freq: Every day | ORAL | Status: DC
  Administered 2020-12-25 – 2020-12-26 (×2): 325 mg via ORAL
  Filled 2020-12-24 (×4): qty 1

## 2020-12-24 MED ORDER — TRAZODONE HCL 50 MG PO TABS: 50.0000 mg | ORAL_TABLET | Freq: Every evening | ORAL | Status: DC | PRN

## 2020-12-24 NOTE — ED Notes (Signed)
Took over care of pt. Pt ambulated to bathroom without complication. Pt in NAD at this time. VSS. Awaiting further orders. Will continue to monitor.

## 2020-12-24 NOTE — ED Notes (Signed)
Pt provided phone to contact family 

## 2020-12-24 NOTE — Procedures (Signed)
Lumbar puncture without difficulty at L4-5.  Complications:  None  Blood Loss: none  See dictation in canopy pacs

## 2020-12-24 NOTE — ED Notes (Signed)
Pharmacy contacted for missing med 

## 2020-12-24 NOTE — Progress Notes (Signed)
PROGRESS NOTE    Kelly Poole   [id number]  DOB: [birth year]  PCP: Venita Lick, NP    DOA: 12/23/2020 LOS: 1   Brief Narrative   Kelly Poole is a 70 y.o. female with medical history significant for stage IIIb chronic kidney disease with baseline creatinine 1.4-1.5, anemia of chronic kidney disease with baseline hemoglobin 9-10, hypertension, hyperlipidemia who presented to the ED on 12/23/2020 after having a witnessed generalized tonic-clonic seizure at home lasting approximately 10 minutes.  On-call neurologist was contacted by the ED physician and recommended EEG, MRI and admission for further evaluation.  MRI of the brain showed anterior right frontal leptomeningeal contrast-enhancement with subadjacent cortical/subcortical edema without significant mass-effect.  Findings could be seen in infectious or inflammatory meningitis.  Patient was loaded with IV Keppra.  Neurology is following.     Assessment & Plan   Principal Problem:   Seizure ([place]) Active Problems:   Hypertension   CKD (chronic kidney disease) stage 3, GFR 30-59 ml/min (HCC)   Acute encephalopathy   AKI (acute kidney injury) ([place])   New onset seizure -etiology to be determined.  Findings of MRI brain as outlined above concerning for potential infectious versus inflammatory meningitis.  EEG suggestive of cortical dysfunction in the right hemisphere due to underlying structural abnormality or postictal state, mild diffuse encephalopathy, no seizures or defendant elective form discharges were seen. --Neurology is following --Continue Keppra --Follow-up LP studies --Further changes in medications per neurology --Seizure precautions --Ativan IV as needed for seizure activity   Acute kidney injury superimposed on CKD stage IIIb -presented with creatinine 1.76, baseline appears around 1.4 back in August.  Hold patient's losartan for now.  This could be secondary to rhabdo following seizure.  CK  elevated at 1132. --Monitor BMP daily --Continue IV hydration --Follow CK level --Avoid nephrotoxins and hypotension, hold losartan   Rhabdomyolysis -secondary to seizure.  On IV fluids as above.  Monitor CK level.   Essential hypertension -chronic.  Takes HCTZ and losartan at home.  Will hold both of these for now in the setting of AKI.  Oral hydralazine 25 mg 3 times daily for now.  Transition back to her home regimen once AKI improves.   Normocytic anemia -likely anemia of chronic disease given CKD stage IIIb.  Baseline hemoglobin appears around 9-10.  No evidence of active bleeding.   --anemia panel with morning labs tomorrow --Follow CBC    Obesity: Body mass index is 32.51 kg/m.  Complicates overall care and prognosis.  Recommend efforts at lifestyle modification including diet and exercise for weight loss and overall long-term health.   DVT prophylaxis: SCDs Start: 12/24/20 0031   Diet:  Diet Orders (From admission, onward)    Start     Ordered   12/24/20 1255  Diet Heart Room service appropriate? Yes; Fluid consistency: Thin  Diet effective now       Question Answer Comment  Room service appropriate? Yes   Fluid consistency: Thin      12/24/20 1254            Code Status: Full Code    Subjective 12/24/20    Patient seen in the ED today while holding for a bed.  She denies having any prodromal symptoms before the seizure yesterday.  No seizure activity since.  She does report intermittent right-sided headaches that last only seconds for the past few weeks or so.  Denies chest pain, shortness of breath, fevers, nausea vomiting or other acute  complaints.   Disposition Plan & Communication   Status is: Inpatient  Remains inpatient appropriate because:IV treatments appropriate due to intensity of illness or inability to take PO .  On IV Keppra and neurology consult pending  Dispo: The patient is from: Home              Anticipated d/c is to: Home               Anticipated d/c date is: 2 days              Patient currently is not medically stable to d/c.        Family Communication: Brother at bedside during encounter    Consults, Procedures, Significant Events   Consultants:   Neurology  Procedures:   EEG 12/23/2020  Antimicrobials:  Anti-infectives (From admission, onward)   None        Objective   Vitals:   12/24/20 1200 12/24/20 1230 12/24/20 1300 12/24/20 1330  BP: (!) 115/96 (!) 156/85 (!) 164/88 (!) 156/96  Pulse: 85 79 80 85  Resp: 14 (!) 23 18 19   Temp:      TempSrc:      SpO2: 96% 98% 98% 97%  Weight:      Height:        Intake/Output Summary (Last 24 hours) at 12/24/2020 1449 Last data filed at 12/24/2020 0920 Gross per 24 hour  Intake 50 ml  Output -  Net 50 ml   Filed Weights   12/23/20 1248  Weight: 65.8 kg    Physical Exam:  General exam: awake, alert, no acute distress HEENT: atraumatic, clear conjunctiva, anicteric sclera, moist mucus membranes, hearing grossly normal  Respiratory system: CTAB, no wheezes, rales or rhonchi, normal respiratory effort. Cardiovascular system: normal S1/S2, RRR, no pedal edema.   Central nervous system: A&O x4. no gross focal neurologic deficits, normal speech Extremities: moves all, no edema, normal tone Skin: warm, dry, intact  Psychiatry: normal mood, congruent affect, judgement and insight appear normal  Labs   Data Reviewed: I have personally reviewed following labs and imaging studies  CBC: Recent Labs  Lab 12/23/20 1256 12/24/20 0532  WBC 9.8 10.4  NEUTROABS 8.1* 7.5  HGB 9.0* 8.7*  HCT 28.0* 27.0*  MCV 95.2 94.7  PLT 269 0000000   Basic Metabolic Panel: Recent Labs  Lab 12/23/20 1256 12/23/20 1517 12/24/20 0532  NA 138  --  136  K 4.0  --  4.2  CL 104  --  103  CO2 23  --  23  GLUCOSE 113*  --  93  BUN 26*  --  24*  CREATININE 1.76*  --  1.52*  CALCIUM 9.2  --  9.1  MG  --  1.8 1.6*   GFR: Estimated Creatinine Clearance: 27.3 mL/min  (A) (by C-G formula based on SCr of 1.52 mg/dL (H)). Liver Function Tests: Recent Labs  Lab 12/23/20 1256 12/24/20 0532  AST 56* 42*  ALT 56* 42  ALKPHOS 87 78  BILITOT 0.6 1.0  PROT 7.9 7.1  ALBUMIN 4.0 3.6   No results for input(s): LIPASE, AMYLASE in the last 168 hours. No results for input(s): AMMONIA in the last 168 hours. Coagulation Profile: No results for input(s): INR, PROTIME in the last 168 hours. Cardiac Enzymes: Recent Labs  Lab 12/23/20 1517  CKTOTAL 1,132*   BNP (last 3 results) No results for input(s): PROBNP in the last 8760 hours. HbA1C: No results for input(s): HGBA1C in the last  72 hours. CBG: No results for input(s): GLUCAP in the last 168 hours. Lipid Profile: No results for input(s): CHOL, HDL, LDLCALC, TRIG, CHOLHDL, LDLDIRECT in the last 72 hours. Thyroid Function Tests: No results for input(s): TSH, T4TOTAL, FREET4, T3FREE, THYROIDAB in the last 72 hours. Anemia Panel: No results for input(s): VITAMINB12, FOLATE, FERRITIN, TIBC, IRON, RETICCTPCT in the last 72 hours. Sepsis Labs: No results for input(s): PROCALCITON, LATICACIDVEN in the last 168 hours.  Recent Results (from the past 240 hour(s))  SARS CORONAVIRUS 2 (TAT 6-24 HRS) Nasopharyngeal Nasopharyngeal Swab     Status: None   Collection Time: 12/23/20  1:40 PM   Specimen: Nasopharyngeal Swab  Result Value Ref Range Status   SARS Coronavirus 2 NEGATIVE NEGATIVE Final    Comment: (NOTE) SARS-CoV-2 target nucleic acids are NOT DETECTED.  The SARS-CoV-2 RNA is generally detectable in upper and lower respiratory specimens during the acute phase of infection. Negative results do not preclude SARS-CoV-2 infection, do not rule out co-infections with other pathogens, and should not be used as the sole basis for treatment or other patient management decisions. Negative results must be combined with clinical observations, patient history, and epidemiological information. The expected result  is Negative.  Fact Sheet for Patients: SugarRoll.be  Fact Sheet for Healthcare Providers: https://www.woods-mathews.com/  This test is not yet approved or cleared by the [place] FDA and  has been authorized for detection and/or diagnosis of SARS-CoV-2 by FDA under an Emergency Use Authorization (EUA). This EUA will remain  in effect (meaning this test can be used) for the duration of the COVID-19 declaration under Se ction 564(b)(1) of the Act, 21 U.S.C. section 360bbb-3(b)(1), unless the authorization is terminated or revoked sooner.  Performed at [facility], [place] [address], [place], [place] [address]   Resp Panel by RT-PCR (Flu A&B, Covid) Nasopharyngeal Swab     Status: None   Collection Time: 12/23/20  8:37 PM   Specimen: Nasopharyngeal Swab; Nasopharyngeal(NP) swabs in vial transport medium  Result Value Ref Range Status   SARS Coronavirus 2 by RT PCR NEGATIVE NEGATIVE Final    Comment: (NOTE) SARS-CoV-2 target nucleic acids are NOT DETECTED.  The SARS-CoV-2 RNA is generally detectable in upper respiratory specimens during the acute phase of infection. The lowest concentration of SARS-CoV-2 viral copies this assay can detect is 138 copies/mL. A negative result does not preclude SARS-Cov-2 infection and should not be used as the sole basis for treatment or other patient management decisions. A negative result may occur with  improper specimen collection/handling, submission of specimen other than nasopharyngeal swab, presence of viral mutation(s) within the areas targeted by this assay, and inadequate number of viral copies(<138 copies/mL). A negative result must be combined with clinical observations, patient history, and epidemiological information. The expected result is Negative.  Fact Sheet for Patients:  EntrepreneurPulse.com.au  Fact Sheet for Healthcare Providers:   IncredibleEmployment.be  This test is no t yet approved or cleared by the [place] FDA and  has been authorized for detection and/or diagnosis of SARS-CoV-2 by FDA under an Emergency Use Authorization (EUA). This EUA will remain  in effect (meaning this test can be used) for the duration of the COVID-19 declaration under Section 564(b)(1) of the Act, 21 U.S.C.section 360bbb-3(b)(1), unless the authorization is terminated  or revoked sooner.       Influenza A by PCR NEGATIVE NEGATIVE Final   Influenza B by PCR NEGATIVE NEGATIVE Final    Comment: (NOTE) The Xpert Xpress  SARS-CoV-2/FLU/RSV plus assay is intended as an aid in the diagnosis of influenza from Nasopharyngeal swab specimens and should not be used as a sole basis for treatment. Nasal washings and aspirates are unacceptable for Xpert Xpress SARS-CoV-2/FLU/RSV testing.  Fact Sheet for Patients: EntrepreneurPulse.com.au  Fact Sheet for Healthcare Providers: IncredibleEmployment.be  This test is not yet approved or cleared by the [place] FDA and has been authorized for detection and/or diagnosis of SARS-CoV-2 by FDA under an Emergency Use Authorization (EUA). This EUA will remain in effect (meaning this test can be used) for the duration of the COVID-19 declaration under Section 564(b)(1) of the Act, 21 U.S.C. section 360bbb-3(b)(1), unless the authorization is terminated or revoked.  Performed at [facility], [address] [place], [place] [address]   CSF culture     Status: None (Preliminary result)   Collection Time: 12/24/20  9:03 AM   Specimen: PATH Cytology CSF; Cerebrospinal Fluid  Result Value Ref Range Status   Specimen Description CSF  Final   Special Requests NONE  Final   Gram Stain   Final    FEW WBC SEEN NO ORGANISMS SEEN Performed at [facility], [address] [place], [place] [address]    Culture PENDING   Incomplete   Report Status PENDING  Incomplete      Imaging Studies   EEG  Result Date: 12/23/2020 Lora Havens, MD     12/23/2020  5:08 PM Patient Name: AME MCNULTY MRN: [id number] Epilepsy Attending: Lora Havens Referring Physician/Provider: Dr Merlyn Lot Date: 12/23/2020 Duration: 24.23 mins Patient history: 70yo F with GTC shaking episode. EEG to evaluate for seizure Level of alertness: Awake AEDs during EEG study: None Technical aspects: This EEG study was done with scalp electrodes positioned according to the 10-20 International system of electrode placement. Electrical activity was acquired at a sampling rate of 500Hz  and reviewed with a high frequency filter of 70Hz  and a low frequency filter of 1Hz . EEG data were recorded continuously and digitally stored. Description: The posterior dominant rhythm consists of 8 Hz activity of moderate voltage (25-35 uV) seen predominantly in posterior head regions, asymmetric ( R<L) and reactive to eye opening and eye closing. EEG showed continuous 3 to 6 Hz theta-delta slowing in right hemisphere as well as intermittent generalized 2-3hz  delta slowing. Physiologic photic driving was not seen during photic stimulation. Hyperventilation was not performed. ABNORMALITY -Continued slow, right hemisphere - Background asymmetry, right<left IMPRESSION: This study is suggestive of cortical dysfunction in right hemisphere due to underlying structural abnormality or post-ictal state. Additionally there is mild diffuse encephalopathy, non specific etiology. No seizures or definite epileptiform discharges were seen throughout the recording. Lora Havens   CT Head Wo Contrast  Result Date: 12/23/2020 CLINICAL DATA:  Seizure, nontraumatic. Additional history provided: Witnessed seizure, no prior history. EXAM: CT HEAD WITHOUT CONTRAST TECHNIQUE: Contiguous axial images were obtained from the base of the skull through the vertex without intravenous  contrast. COMPARISON:  No pertinent prior exams available for comparison. FINDINGS: Brain: Cerebral volume is normal for age. There is no acute intracranial hemorrhage. No demarcated cortical infarct. No extra-axial fluid collection. No evidence of intracranial mass. No midline shift or hydrocephalus. Partially empty sella turcica. The cerebellar tonsils are incompletely imaged. However, the left cerebellar tonsil appears to extend at least 8 mm below the level of the foramen magnum and this may reflect a Chiari 1 malformation. Vascular: No hyperdense vessel.  Atherosclerotic calcifications. Skull: Normal. Negative for  fracture or focal lesion. Sinuses/Orbits: Visualized orbits show no acute finding. Mild ethmoid sinus mucosal thickening. IMPRESSION: No CT evidence of acute intracranial abnormality. Given the provided history of first-time seizure, consider a brain MRI for further evaluation. The cerebellar tonsils are incompletely imaged. However, the left cerebellar tonsil appears to extend at least 8 mm below the level of the foramen magnum, and this may reflect a Chiari 1 malformation. Alternatively, low-lying cerebellar tonsils may be seen in the setting of idiopathic intracranial hypertension. Consider brain MRI for further characterization. Electronically Signed   By: Jackey Loge DO   On: 12/23/2020 13:35   MR Brain W and Wo Contrast  Result Date: 12/23/2020 CLINICAL DATA:  Seizure, abnormal neuro exam. EXAM: MRI HEAD WITHOUT AND WITH CONTRAST TECHNIQUE: Multiplanar, multiecho pulse sequences of the brain and surrounding structures were obtained without and with intravenous contrast. CONTRAST:  9mL GADAVIST GADOBUTROL 1 MMOL/ML IV SOLN COMPARISON:  Head CT December 24, 2019. FINDINGS: Brain: Leptomeningeal contrast enhancement within anterior right frontal cerebral sulci with subjacent cortical/subcortical edema without significant mass effect. Remainder of the brain parenchyma is unremarkable. Bilateral  Chiari 1 malformation. No acute infarct, hemorrhage or hydrocephalus. Vascular: Normal flow voids. Skull and upper cervical spine: Normal marrow signal. Sinuses/Orbits: Negative. IMPRESSION: Leptomeningeal contrast enhancement within anterior right frontal cerebral sulci with subjacent cortical/subcortical edema without significant mass effect. Findings may represent infectious/inflammatory meningitis in the appropriate clinical setting. These results will be called to the ordering clinician or representative by the Radiologist Assistant, and communication documented in the PACS or [employer]. Electronically Signed   By: Baldemar Lenis M.D.   On: 12/23/2020 20:04   DG Chest Portable 1 View  Result Date: 12/23/2020 CLINICAL DATA:  Per MD note, eval for infiltrate. EMS called because pt became unresponsive at caretakers house. Unknown time of episode. EMS states pt had witnessed seizure to which they gave versed 5 mg IV EXAM: PORTABLE CHEST 1 VIEW COMPARISON:  None. FINDINGS: Cardiac silhouette is normal in size. No mediastinal or hilar masses. No evidence of adenopathy. Linear opacities noted in the lung bases consistent with scarring or atelectasis. Lungs are otherwise clear. No pleural effusion or pneumothorax. Skeletal structures are grossly intact. IMPRESSION: No active disease. Electronically Signed   By: Amie Portland M.D.   On: 12/23/2020 13:34   DG Lumbar Puncture Fluoro Guide  Result Date: 12/24/2020 CLINICAL DATA:  Seizure activity, possible meningitis EXAM: DIAGNOSTIC LUMBAR PUNCTURE UNDER FLUOROSCOPIC GUIDANCE FLUOROSCOPY TIME:  Fluoroscopy Time:  42 seconds Radiation Exposure Index (if provided by the fluoroscopic device): 21.3 mGy Number of Acquired Spot Images: 1 PROCEDURE: Informed consent was obtained from the patient prior to the procedure, including potential complications of headache, allergy, and pain. With the patient prone, the lower back was prepped with Betadine. 1%  Lidocaine was used for local anesthesia. Lumbar puncture was performed at the L4-5 level using a 20 gauge needle with return of clear CSF with an opening pressure of 21 cm water. Fifteen ml of CSF were obtained for laboratory studies. The patient tolerated the procedure well and there were no apparent complications. IMPRESSION: Successful lumbar puncture under fluoroscopic guidance. Samples were sent for laboratory evaluation. Electronically Signed   By: Alcide Clever M.D.   On: 12/24/2020 09:34     Medications   Scheduled Meds: . levETIRAcetam  250 mg Oral BID   Continuous Infusions: . lactated ringers 75 mL/hr at 12/24/20 0101       LOS: 1 day  Time spent: 30 minutes     Ezekiel Slocumb, DO [facility]  12/24/2020, 2:49 PM    If 7PM-7AM, please contact night-coverage. How to contact the [facility] Attending or Consulting provider [place] or covering provider during after hours [place], for this patient?    1. Check the care team in [facility] and look for a) attending/consulting [facility] provider listed and b) the [facility] team listed 2. Log into [email or web address] and use [place] universal password to access. If you do not have the password, please contact the hospital operator. 3. Locate the [facility] provider you are looking for under [facility] and page to a number that you can be directly reached. 4. If you still have difficulty reaching the provider, please page the [facility] (Director on Call) for the Hospitalists listed on amion for assistance.

## 2020-12-24 NOTE — ED Notes (Signed)
Pt given meal tray. Pt repositioned in bed and head of bed raised.

## 2020-12-24 NOTE — Hospital Course (Addendum)
Kelly Poole is a 70 y.o. female with medical history significant for stage IIIb chronic kidney disease with baseline creatinine 1.4-1.5, anemia of chronic kidney disease with baseline hemoglobin 9-10, hypertension, hyperlipidemia who presented to the ED on 12/23/2020 after having a witnessed generalized tonic-clonic seizure at home lasting approximately 10 minutes.  On-call neurologist was contacted by the ED physician and recommended EEG, MRI and admission for further evaluation.  MRI of the brain showed anterior right frontal leptomeningeal contrast-enhancement with subadjacent cortical/subcortical edema without significant mass-effect.  Findings could be seen in infectious or inflammatory meningitis.  Patient was loaded with IV Keppra.  Neurology is following.

## 2020-12-24 NOTE — ED Notes (Signed)
Pt in radiology for LP

## 2020-12-24 NOTE — Telephone Encounter (Signed)
Medication Refill - Medication: losartan (COZAAR) 100 MG tablet    Has the patient contacted their pharmacy? yes (Agent: If no, request that the patient contact the pharmacy for the refill.) (Agent: If yes, when and what did the pharmacy advise?)Contact PCP  Preferred Pharmacy (with phone number or street name):  [facility] [address] [place]), [place] - [address] Phone:  [phone]  Fax:  [phone]       Agent: Please be advised that RX refills may take up to 3 business days. We ask that you follow-up with your pharmacy.

## 2020-12-25 ENCOUNTER — Encounter: Payer: Self-pay | Admitting: Internal Medicine

## 2020-12-25 DIAGNOSIS — N179 Acute kidney failure, unspecified: Secondary | ICD-10-CM | POA: Diagnosis not present

## 2020-12-25 DIAGNOSIS — G9340 Encephalopathy, unspecified: Secondary | ICD-10-CM

## 2020-12-25 LAB — BASIC METABOLIC PANEL
Anion gap: 7 (ref 5–15)
BUN: 26 mg/dL — ABNORMAL HIGH (ref 8–23)
CO2: 25 mmol/L (ref 22–32)
Calcium: 9.1 mg/dL (ref 8.9–10.3)
Chloride: 106 mmol/L (ref 98–111)
Creatinine, Ser: 1.49 mg/dL — ABNORMAL HIGH (ref 0.44–1.00)
GFR, Estimated: 38 mL/min — ABNORMAL LOW (ref >60)
Glucose, Bld: 99 mg/dL (ref 70–99)
Potassium: 4 mmol/L (ref 3.5–5.1)
Sodium: 138 mmol/L (ref 135–145)

## 2020-12-25 LAB — CBC
HCT: 26.5 % — ABNORMAL LOW (ref 36.0–46.0)
Hemoglobin: 9 g/dL — ABNORMAL LOW (ref 12.0–15.0)
MCH: 31.5 pg (ref 26.0–34.0)
MCHC: 34 g/dL (ref 30.0–36.0)
MCV: 92.7 fL (ref 80.0–100.0)
Platelets: 281 10*3/uL (ref 150–400)
RBC: 2.86 MIL/uL — ABNORMAL LOW (ref 3.87–5.11)
RDW: 12.5 % (ref 11.5–15.5)
WBC: 9.2 10*3/uL (ref 4.0–10.5)
nRBC: 0 % (ref 0.0–0.2)

## 2020-12-25 LAB — IRON AND TIBC
Iron: 41 ug/dL (ref 28–170)
Saturation Ratios: 21 % (ref 10.4–31.8)
TIBC: 200 ug/dL — ABNORMAL LOW (ref 250–450)
UIBC: 159 ug/dL

## 2020-12-25 LAB — RETICULOCYTES
Immature Retic Fract: 14.4 % (ref 2.3–15.9)
RBC.: 2.79 MIL/uL — ABNORMAL LOW (ref 3.87–5.11)
Retic Count, Absolute: 34.3 10*3/uL (ref 19.0–186.0)
Retic Ct Pct: 1.2 % (ref 0.4–3.1)

## 2020-12-25 LAB — FOLATE: Folate: 36 ng/mL (ref >5.9)

## 2020-12-25 LAB — VITAMIN B12: Vitamin B-12: 3213 pg/mL — ABNORMAL HIGH (ref 180–914)

## 2020-12-25 LAB — FERRITIN: Ferritin: 220 ng/mL (ref 11–307)

## 2020-12-25 LAB — CK: Total CK: 808 U/L — ABNORMAL HIGH (ref 38–234)

## 2020-12-25 LAB — CYTOLOGY - NON PAP

## 2020-12-25 MED ORDER — LACTATED RINGERS IV SOLN: INTRAVENOUS | Status: DC

## 2020-12-25 MED ORDER — HYDRALAZINE HCL 50 MG PO TABS
50.0000 mg | ORAL_TABLET | Freq: Three times a day (TID) | ORAL | Status: DC
Start: 2020-12-25 — End: 2020-12-26
  Administered 2020-12-25 – 2020-12-26 (×2): 50 mg via ORAL
  Filled 2020-12-25 (×2): qty 1

## 2020-12-25 MED ORDER — HYDROCHLOROTHIAZIDE 25 MG PO TABS
25.0000 mg | ORAL_TABLET | Freq: Every day | ORAL | Status: DC
  Administered 2020-12-25 – 2020-12-26 (×2): 25 mg via ORAL
  Filled 2020-12-25 (×2): qty 1

## 2020-12-25 NOTE — Progress Notes (Signed)
PROGRESS NOTE    DONEVA Poole   YBO:[id number]  DOB: 04/25/1951  PCP: Marjie Skiff, NP    DOA: 12/23/2020 LOS: 2   Brief Narrative   Kelly Poole is a 70 y.o. female with medical history significant for stage IIIb chronic kidney disease with baseline creatinine 1.4-1.5, anemia of chronic kidney disease with baseline hemoglobin 9-10, hypertension, hyperlipidemia who presented to the ED on 12/23/2020 after having a witnessed generalized tonic-clonic seizure at home lasting approximately 10 minutes.  On-call neurologist was contacted by the ED physician and recommended EEG, MRI and admission for further evaluation.  MRI of the brain showed anterior right frontal leptomeningeal contrast-enhancement with subadjacent cortical/subcortical edema without significant mass-effect.  Findings could be seen in infectious or inflammatory meningitis.  Patient was loaded with IV Keppra.  Neurology is following.     Assessment & Plan   Principal Problem:   Seizure (HCC) Active Problems:   Hypertension   CKD (chronic kidney disease) stage 3, GFR 30-59 ml/min (HCC)   Acute encephalopathy   AKI (acute kidney injury) (HCC)   New onset seizure  -etiology to be determined.  Findings of MRI brain as outlined above concerning for potential infectious versus inflammatory meningitis.  EEG suggestive of cortical dysfunction in the right hemisphere due to underlying structural abnormality or postictal state, mild diffuse encephalopathy, no seizures or defendant elective form discharges were seen. --Neurology is following.   --LP done with labs send. Plan: --cont Keppra 250 mg BID --Follow-up LP studies --Further changes in medications per neurology --Seizure precautions --Ativan IV as needed for seizure activity  Acute kidney injury superimposed on CKD stage IIIb  -presented with creatinine 1.76, baseline appears around 1.4 back in August.  This could be secondary to rhabdo following  seizure.  CK elevated at 1132. Plan: --hold losartan --cont LR@75   Rhabdomyolysis  -secondary to seizure.   --cont LR@75  --trend CK  Essential hypertension -chronic.   Takes HCTZ and losartan at home.  Both were held in the setting of AKI.  Oral hydralazine 25 mg 3 times daily started instead. --BP trending up Plan: --increase hydralazine to 50 mg BID --resume home HCTZ --hold losartan due to AKI  Normocytic anemia -likely anemia of chronic disease given CKD stage IIIb.  Baseline hemoglobin appears around 9-10.  No evidence of active bleeding.   --anemia panel showed vit b12, folate and iron not def.   Obesity: Body mass index is 31.78 kg/m.  Complicates overall care and prognosis.  Recommend efforts at lifestyle modification including diet and exercise for weight loss and overall long-term health.   DVT prophylaxis: SCDs Start: 12/24/20 0031   Diet:  Diet Orders (From admission, onward)    Start     Ordered   12/24/20 1255  Diet Heart Room service appropriate? Yes; Fluid consistency: Thin  Diet effective now       Question Answer Comment  Room service appropriate? Yes   Fluid consistency: Thin      12/24/20 1254            Code Status: Full Code    Subjective 12/25/20    Pt reported no more pain on the right side of head and face.  No more seizure activity seen.     Disposition Plan & Communication   Status is: Inpatient   Dispo: The patient is from: Home              Anticipated d/c is to: Home  Anticipated d/c date is: tomorrow              Patient currently is not medically stable to d/c.  Neuro has not cleared for discharge.   Family Communication:    Consults, Procedures, Significant Events   Consultants:   Neurology  Procedures:   EEG 12/23/2020  Antimicrobials:  Anti-infectives (From admission, onward)   None        Objective   Vitals:   12/25/20 0407 12/25/20 0800 12/25/20 1125 12/25/20 1619  BP: (!) 168/96 (!)  155/85 (!) 163/88 (!) 174/103  Pulse: 83 82 93 88  Resp: 18 18 19 17   Temp: 98.3 F (36.8 C) 98.1 F (36.7 C) 98.1 F (36.7 C) 98.2 F (36.8 C)  TempSrc: Oral Oral Oral   SpO2: 99% 97% 97% 100%  Weight:      Height:        Intake/Output Summary (Last 24 hours) at 12/25/2020 1756 Last data filed at 12/25/2020 1700 Gross per 24 hour  Intake 120 ml  Output 300 ml  Net -180 ml   Filed Weights   12/23/20 1248 12/25/20 0127  Weight: 65.8 kg 64.3 kg    Physical Exam:  Constitutional: NAD, AAOx3 HEENT: conjunctivae and lids normal, EOMI CV: No cyanosis.   RESP: normal respiratory effort, on RA Extremities: No effusions, edema in BLE SKIN: warm, dry and intact Neuro: II - XII grossly intact.   Psych: Normal mood and affect.  Appropriate judgement and reason   Labs   Data Reviewed: I have personally reviewed following labs and imaging studies  CBC: Recent Labs  Lab 12/23/20 1256 12/24/20 0532 12/25/20 0339  WBC 9.8 10.4 9.2  NEUTROABS 8.1* 7.5  --   HGB 9.0* 8.7* 9.0*  HCT 28.0* 27.0* 26.5*  MCV 95.2 94.7 92.7  PLT 269 195 281   Basic Metabolic Panel: Recent Labs  Lab 12/23/20 1256 12/23/20 1517 12/24/20 0532 12/25/20 0339  NA 138  --  136 138  K 4.0  --  4.2 4.0  CL 104  --  103 106  CO2 23  --  23 25  GLUCOSE 113*  --  93 99  BUN 26*  --  24* 26*  CREATININE 1.76*  --  1.52* 1.49*  CALCIUM 9.2  --  9.1 9.1  MG  --  1.8 1.6*  --    GFR: Estimated Creatinine Clearance: 27.5 mL/min (A) (by C-G formula based on SCr of 1.49 mg/dL (H)). Liver Function Tests: Recent Labs  Lab 12/23/20 1256 12/24/20 0532  AST 56* 42*  ALT 56* 42  ALKPHOS 87 78  BILITOT 0.6 1.0  PROT 7.9 7.1  ALBUMIN 4.0 3.6   No results for input(s): LIPASE, AMYLASE in the last 168 hours. No results for input(s): AMMONIA in the last 168 hours. Coagulation Profile: No results for input(s): INR, PROTIME in the last 168 hours. Cardiac Enzymes: Recent Labs  Lab 12/23/20 1517  12/25/20 0339  CKTOTAL 1,132* 808*   BNP (last 3 results) No results for input(s): PROBNP in the last 8760 hours. HbA1C: No results for input(s): HGBA1C in the last 72 hours. CBG: No results for input(s): GLUCAP in the last 168 hours. Lipid Profile: No results for input(s): CHOL, HDL, LDLCALC, TRIG, CHOLHDL, LDLDIRECT in the last 72 hours. Thyroid Function Tests: No results for input(s): TSH, T4TOTAL, FREET4, T3FREE, THYROIDAB in the last 72 hours. Anemia Panel: Recent Labs    12/25/20 0339  VITAMINB12 3,213*  FOLATE 36.0  FERRITIN 220  TIBC 200*  IRON 41  RETICCTPCT 1.2   Sepsis Labs: No results for input(s): PROCALCITON, LATICACIDVEN in the last 168 hours.  Recent Results (from the past 240 hour(s))  SARS CORONAVIRUS 2 (TAT 6-24 HRS) Nasopharyngeal Nasopharyngeal Swab     Status: None   Collection Time: 12/23/20  1:40 PM   Specimen: Nasopharyngeal Swab  Result Value Ref Range Status   SARS Coronavirus 2 NEGATIVE NEGATIVE Final    Comment: (NOTE) SARS-CoV-2 target nucleic acids are NOT DETECTED.  The SARS-CoV-2 RNA is generally detectable in upper and lower respiratory specimens during the acute phase of infection. Negative results do not preclude SARS-CoV-2 infection, do not rule out co-infections with other pathogens, and should not be used as the sole basis for treatment or other patient management decisions. Negative results must be combined with clinical observations, patient history, and epidemiological information. The expected result is Negative.  Fact Sheet for Patients: SugarRoll.be  Fact Sheet for Healthcare Providers: https://www.woods-mathews.com/  This test is not yet approved or cleared by the [place] FDA and  has been authorized for detection and/or diagnosis of SARS-CoV-2 by FDA under an Emergency Use Authorization (EUA). This EUA will remain  in effect (meaning this test can be used) for the  duration of the COVID-19 declaration under Se ction 564(b)(1) of the Act, 21 U.S.C. section 360bbb-3(b)(1), unless the authorization is terminated or revoked sooner.  Performed at [facility], [place] [address] [place], [place] [address]   Resp Panel by RT-PCR (Flu A&B, Covid) Nasopharyngeal Swab     Status: None   Collection Time: 12/23/20  8:37 PM   Specimen: Nasopharyngeal Swab; Nasopharyngeal(NP) swabs in vial transport medium  Result Value Ref Range Status   SARS Coronavirus 2 by RT PCR NEGATIVE NEGATIVE Final    Comment: (NOTE) SARS-CoV-2 target nucleic acids are NOT DETECTED.  The SARS-CoV-2 RNA is generally detectable in upper respiratory specimens during the acute phase of infection. The lowest concentration of SARS-CoV-2 viral copies this assay can detect is 138 copies/mL. A negative result does not preclude SARS-Cov-2 infection and should not be used as the sole basis for treatment or other patient management decisions. A negative result may occur with  improper specimen collection/handling, submission of specimen other than nasopharyngeal swab, presence of viral mutation(s) within the areas targeted by this assay, and inadequate number of viral copies(<138 copies/mL). A negative result must be combined with clinical observations, patient history, and epidemiological information. The expected result is Negative.  Fact Sheet for Patients:  EntrepreneurPulse.com.au  Fact Sheet for Healthcare Providers:  IncredibleEmployment.be  This test is no t yet approved or cleared by the [place] FDA and  has been authorized for detection and/or diagnosis of SARS-CoV-2 by FDA under an Emergency Use Authorization (EUA). This EUA will remain  in effect (meaning this test can be used) for the duration of the COVID-19 declaration under Section 564(b)(1) of the Act, 21 U.S.C.section 360bbb-3(b)(1), unless the authorization is terminated   or revoked sooner.       Influenza A by PCR NEGATIVE NEGATIVE Final   Influenza B by PCR NEGATIVE NEGATIVE Final    Comment: (NOTE) The Xpert Xpress SARS-CoV-2/FLU/RSV plus assay is intended as an aid in the diagnosis of influenza from Nasopharyngeal swab specimens and should not be used as a sole basis for treatment. Nasal washings and aspirates are unacceptable for Xpert Xpress SARS-CoV-2/FLU/RSV testing.  Fact Sheet for Patients: EntrepreneurPulse.com.au  Fact Sheet for Healthcare Providers:  IncredibleEmployment.be  This test is not yet approved or cleared by the [place] and has been authorized for detection and/or diagnosis of SARS-CoV-2 by FDA under an Emergency Use Authorization (EUA). This EUA will remain in effect (meaning this test can be used) for the duration of the COVID-19 declaration under Section 564(b)(1) of the Act, 21 U.S.C. section 360bbb-3(b)(1), unless the authorization is terminated or revoked.  Performed at [facility], [address] [place], [place] [address]   CSF culture     Status: None (Preliminary result)   Collection Time: 12/24/20  9:03 AM   Specimen: PATH Cytology CSF; Cerebrospinal Fluid  Result Value Ref Range Status   Specimen Description   Final    CSF Performed at [facility], [address], [place], [place] [address]    Special Requests   Final    NONE Performed at [facility], [address], [place], [place] [address]    Gram Stain   Final    FEW WBC SEEN NO ORGANISMS SEEN Performed at [facility], [address], [place], [place] [address]    Culture   Final    NO GROWTH < 24 HOURS Performed at [facility], [place] [address] [place], [place] [address]    Report Status PENDING  Incomplete      Imaging Studies   MR Brain W and Wo Contrast  Result Date: 12/23/2020 CLINICAL DATA:  Seizure, abnormal neuro exam. EXAM: MRI HEAD  WITHOUT AND WITH CONTRAST TECHNIQUE: Multiplanar, multiecho pulse sequences of the brain and surrounding structures were obtained without and with intravenous contrast. CONTRAST:  77mL GADAVIST GADOBUTROL 1 MMOL/ML IV SOLN COMPARISON:  Head CT December 24, 2019. FINDINGS: Brain: Leptomeningeal contrast enhancement within anterior right frontal cerebral sulci with subjacent cortical/subcortical edema without significant mass effect. Remainder of the brain parenchyma is unremarkable. Bilateral Chiari 1 malformation. No acute infarct, hemorrhage or hydrocephalus. Vascular: Normal flow voids. Skull and upper cervical spine: Normal marrow signal. Sinuses/Orbits: Negative. IMPRESSION: Leptomeningeal contrast enhancement within anterior right frontal cerebral sulci with subjacent cortical/subcortical edema without significant mass effect. Findings may represent infectious/inflammatory meningitis in the appropriate clinical setting. These results will be called to the ordering clinician or representative by the Radiologist Assistant, and communication documented in the PACS or [employer]. Electronically Signed   By: Pedro Earls M.D.   On: 12/23/2020 20:04   DG Lumbar Puncture Fluoro Guide  Result Date: 12/24/2020 CLINICAL DATA:  Seizure activity, possible meningitis EXAM: DIAGNOSTIC LUMBAR PUNCTURE UNDER FLUOROSCOPIC GUIDANCE FLUOROSCOPY TIME:  Fluoroscopy Time:  42 seconds Radiation Exposure Index (if provided by the fluoroscopic device): 21.3 mGy Number of Acquired Spot Images: 1 PROCEDURE: Informed consent was obtained from the patient prior to the procedure, including potential complications of headache, allergy, and pain. With the patient prone, the lower back was prepped with Betadine. 1% Lidocaine was used for local anesthesia. Lumbar puncture was performed at the L4-5 level using a 20 gauge needle with return of clear CSF with an opening pressure of 21 cm water. Fifteen ml of CSF were  obtained for laboratory studies. The patient tolerated the procedure well and there were no apparent complications. IMPRESSION: Successful lumbar puncture under fluoroscopic guidance. Samples were sent for laboratory evaluation. Electronically Signed   By: Inez Catalina M.D.   On: 12/24/2020 09:34     Medications   Scheduled Meds: . aspirin EC  81 mg Oral Daily  . ferrous sulfate  325  mg Oral Q breakfast  . hydrALAZINE  25 mg Oral Q8H  . levETIRAcetam  250 mg Oral BID  . pantoprazole  40 mg Oral Daily  . vitamin B-12  1,000 mcg Oral Daily   Continuous Infusions: . lactated ringers 75 mL/hr at 12/25/20 0846       LOS: 2 days     Enzo Bi, md [facility]  12/25/2020, 5:56 PM

## 2020-12-25 NOTE — Progress Notes (Signed)
Mobility Specialist - Progress Note   12/25/20 1006  Mobility  Activity Ambulated in room;Ambulated in hall  Level of Assistance Independent (assit w/ handling heart monitor and IV)  Assistive Device None  Distance Ambulated (ft) 540 ft  Mobility Response Tolerated well  Mobility performed by Mobility specialist  $Mobility charge 1 Mobility    Pt laying in bed upon arrival. Pt agreed to session. Pt independent t/o session. Pt ambulated 540' (3 laps around nursing unit) this session. Required assist only w/ handling heart monitor and IV. No LOB noted. Pt c/o feeling slightly lightheaded during 3rd lap. Overall, pt tolerated session very well. Pt pleasant and motivated to go home. Pt left laying in bed w/ all needs placed in reach. Nurse was notified.    Kelly Poole Mobility Specialist  12/25/20, 10:11 AM

## 2020-12-25 NOTE — Consult Note (Addendum)
Neurology Consult H&P  CC: seizure  History is obtained from: Patient and chart   HPI: Kelly Poole is a 70 y.o. female PMHx as reviewed below was awake and alert caring for family member and suddenly began shaking. EMS reported generalized tonic clonic seizure, was given midazolam and was postictal on arrival.  She describes the event as follows: She was taking care of her mother and her sister arrived to relieve her. The patient noticed that she was repeatedly "picking" at the cover until her sister encouraged her to go home due to snow. The patient then walked over the a chair and put her shoes on, the she remembers taking her shoes off and everything else is blank.   She adds that she had run out of her blood pressure medication and went without for 4 days.  No past history of seizure. No family history of epilepsy. She states that after COVID booster she had sharp headache along her right forehead radiating down to her right submandibular region 7/10 which lasted several days and subsided days before seizure. Denies intercurrent illness, F/C/CP/SOB/hearing-vision changes/weakness  ROS: A complete ROS was performed and is negative except as noted in the HPI.   Past Medical History:  Diagnosis Date  . Breast cancer ([place]) 1991   left breast mastectomy. No chemo or rad tx  . GERD (gastroesophageal reflux disease)   . Heart murmur   . High cholesterol   . Hypertension     Family History  Problem Relation Age of Onset  . Stroke Mother   . Thyroid disease Mother   . Cancer Father   . Diabetes Maternal Aunt   . Breast cancer Other     Social History:  reports that she has never smoked. She has never used smokeless tobacco. She reports that she does not drink alcohol and does not use drugs.   Prior to Admission medications   Medication Sig Start Date End Date Taking? Authorizing Provider  Ascorbic Acid (VITAMIN C) 1000 MG tablet Take 1,000 mg by mouth daily.   Yes  [provider]  aspirin 81 MG tablet Take 81 mg by mouth daily.   Yes [provider]  atorvastatin (LIPITOR) 10 MG tablet Take 1 tablet (10 mg total) by mouth daily. 06/25/20  Yes Cannady, Jolene T, NP  Cholecalciferol (VITAMIN D-3) 125 MCG (5000 UT) TABS Take 1 tablet by mouth daily. Takes every other day   Yes [provider]  ferrous sulfate 325 (65 FE) MG tablet Take 1 tablet (325 mg total) by mouth daily with breakfast. 07/30/20  Yes Cannady, Jolene T, NP  fluticasone (FLONASE) 50 MCG/ACT nasal spray Place 2 sprays into both nostrils daily for 14 days. 12/10/20 12/24/20 Yes Myles Gip, DO  hydrochlorothiazide (HYDRODIURIL) 25 MG tablet Take 1 tablet (25 mg total) by mouth daily. 06/25/20  Yes Cannady, Henrine Screws T, NP  Multiple Vitamin (MULTIVITAMINS PO) Take by mouth daily.   Yes [provider]  omeprazole (PRILOSEC) 20 MG capsule Take 1 capsule (20 mg total) by mouth daily. 06/21/20  Yes Cannady, Jolene T, NP  traZODone (DESYREL) 50 MG tablet trazodone 50 mg tablet  TAKE 1 2 TO 1 (ONE HALF TO ONE) TABLET BY MOUTH AT BEDTIME AS NEEDED FOR SLEEP   Yes [provider]  vitamin B-12 (CYANOCOBALAMIN) 1000 MCG tablet Take 1 tablet (1,000 mcg total) by mouth daily. 07/30/20  Yes Cannady, Jolene T, NP  vitamin E 100 UNIT capsule Take 100 Units by  mouth daily.   Yes [provider]  Garlic 1000 MG CAPS Take 1,000 mg by mouth daily.    [provider]  ketoconazole (NIZORAL) 2 % cream Apply 1 application topically daily. Patient not taking: No sig reported 04/11/20   Aura Dials T, NP  losartan (COZAAR) 100 MG tablet Take 1 tablet (100 mg total) by mouth daily. 12/24/20   Cannady, Corrie Dandy T, NP  TURMERIC PO Take 1 capsule by mouth daily. Patient not taking: No sig reported    [provider]    Exam: Current vital signs: BP (!) 168/96 (BP Location: Left Arm)   Pulse 83   Temp 98.3 F (36.8 C) (Oral)   Resp 18   Ht 4\' 8"   (1.422 m)   Wt 64.3 kg   SpO2 99%   BMI 31.78 kg/m   Physical Exam  Constitutional: Appears well-developed and well-nourished.  Psych: Affect appropriate to situation Eyes: No scleral injection HENT: No OP obstrucion Head: Normocephalic.  Cardiovascular: Normal rate and regular rhythm.  Respiratory: Effort normal and breath sounds normal to anterior ascultation GI: Soft.  No distension. There is no tenderness.  Skin: WDI  Neuro: Mental Status: Patient is awake, alert, oriented to person, place, month, year, and situation. Patient is able to give a clear and coherent history. No signs of aphasia or neglect. Cranial Nerves: II: Visual Fields are full. Pupils are equal, round, and reactive to light. III,IV, VI: EOMI without ptosis or diploplia.  V: Facial sensation is symmetric to temperature VII: Facial movement is symmetric.  VIII: hearing is intact to voice X: Uvula elevates symmetrically XI: Shoulder shrug is symmetric. XII: tongue is midline without atrophy or fasciculations.  Motor: Tone is normal. Bulk is normal. 5/5 strength was present in all four extremities. Sensory: Sensation is symmetric to light touch and temperature in the arms and legs. Deep Tendon Reflexes: 2+ and symmetric in the biceps and patellae. Plantars: Toes are downgoing bilaterally. Cerebellar: FNF and HKS are intact bilaterally.  I have reviewed labs in epic and the pertinent results are: CK 1232--->808, BUN 26, Cr 1.49, Mg 1.6, AST 42  CSF NO MALIGNANCY IDENTIFIED.  MILD PLEOCYTOSIS with a relative lymphocyte  predominance. GS no organisms Culture NGTD  Ref. Range 12/24/2020 09:03  Appearance, CSF Latest Ref Range: CLEAR  CLEAR  RBC Count, CSF Latest Ref Range: 0 - 3 /cu mm 0  WBC, CSF Latest Ref Range: 0 - 5 /cu mm 8 (H)  Segmented Neutrophils-CSF Latest Units: % 12  Lymphs, CSF Latest Units: % 78  Monocyte-Macrophage-Spinal Fluid Latest Units: % 10  Eosinophils, CSF Latest Units: % 0   Color, CSF Latest Ref Range: COLORLESS  COLORLESS  Supernatant Unknown NOT INDICATED   I have reviewed the images obtained: MRI brain showed restricted diffusion leptomeningeal enhancement along anterior right frontal cerebral sulci with subjacent cortical/subcortical edema on T2 without significant mass effect. Chest x-ray 12/23/2020 did not show active disease/perihilar adenopathy. EEG 12/23/2020 showed 3-6hz  slowing continuously over the right hemisphere without seizures or definite epileptiform abnormalities.  Assessment: Kelly Poole is a 70 y.o. female right handed PMHx breast cancer, HTN, HLD with new onset seizure. MRI showed right frontal lobe/subcortical hyperintensity with leptomeningeal enhancement, EEG showed continuous right hemispheric slowing suggesting underlying structural abnormality (vs postictal slowing) and semiology is highly suggestive of focal frontal lobe seizure. CSF studies show borderline pleocytosis with lymphocytic predominance. The patient currently does not have headache, nor does she have meningeal sigs or focal  neurologic sings.The differential causes of leptomeningeal contrast enhancement in this case are many and include seizure, brain metastasis, meningitis/encephalitis, sarcoidosis, toxic metabolic encephalopathy, atypical neurosyphilis, HIV, IgG G4-related pachymeningitis (though only associated with hemochromatosis). Given her history of breast cancer in new onset provoked seizure, it would be prudent to rule out paraneoplastic etiology which a panel is available through [facility] will discuss with primary team provided leftover CSF sample still available.   Plan: Recommend increasing levetiracetam 500mg  and continue and she will need outpatient follow up with epileptologist. Initial Labs: vitamin D, ionized calcium, angiotensin-converting enzyme (ACE), serum amyloid A (SAA), soluble interleukin-2 receptor (If avaliable)  Not clear if patient has had  recent follow up regarding history of breast cancer. Ordered autoimmune/paraneoplastic panel which is a send out as markers may appear long before solid tumor manifests. If no etiology is found, she would need repeat MRI in 4-6 weeks to evaluate for changes.  Discussed seizure and [facility] laws and patients with seizures should not drive until they have been seizure-free for six months. Discussed seizure safety: Use caution when using heavy equipment or power tools. Avoid working on ladders or at heights. Take showers instead of baths. Ensure the water temperature is not too high on the home water heater. Do not go swimming alone. Do not lock yourself in a room alone (i.e. bathroom). When caring for infants or small children, sit down when holding, feeding, or changing them to minimize risk of injury to the child in the event you have a seizure. Maintain good sleep hygiene. Avoid alcohol.     Electronically signed by: Dr. Lynnae Sandhoff Pager: [phone] 12/25/2020, 8:39 AM

## 2020-12-25 NOTE — ED Notes (Signed)
Message sent to floor RN to see if they are ready for the pt. Pt in NAD at this time. VSS.

## 2020-12-26 ENCOUNTER — Telehealth: Payer: Self-pay

## 2020-12-26 DIAGNOSIS — N179 Acute kidney failure, unspecified: Secondary | ICD-10-CM | POA: Diagnosis not present

## 2020-12-26 LAB — BASIC METABOLIC PANEL
Anion gap: 11 (ref 5–15)
BUN: 17 mg/dL (ref 8–23)
CO2: 25 mmol/L (ref 22–32)
Calcium: 9.3 mg/dL (ref 8.9–10.3)
Chloride: 103 mmol/L (ref 98–111)
Creatinine, Ser: 1.38 mg/dL — ABNORMAL HIGH (ref 0.44–1.00)
GFR, Estimated: 42 mL/min — ABNORMAL LOW (ref >60)
Glucose, Bld: 88 mg/dL (ref 70–99)
Potassium: 3.8 mmol/L (ref 3.5–5.1)
Sodium: 139 mmol/L (ref 135–145)

## 2020-12-26 LAB — CBC
HCT: 27.1 % — ABNORMAL LOW (ref 36.0–46.0)
Hemoglobin: 8.9 g/dL — ABNORMAL LOW (ref 12.0–15.0)
MCH: 30.8 pg (ref 26.0–34.0)
MCHC: 32.8 g/dL (ref 30.0–36.0)
MCV: 93.8 fL (ref 80.0–100.0)
Platelets: 271 10*3/uL (ref 150–400)
RBC: 2.89 MIL/uL — ABNORMAL LOW (ref 3.87–5.11)
RDW: 12.5 % (ref 11.5–15.5)
WBC: 10.7 10*3/uL — ABNORMAL HIGH (ref 4.0–10.5)
nRBC: 0 % (ref 0.0–0.2)

## 2020-12-26 LAB — MAGNESIUM: Magnesium: 1.6 mg/dL — ABNORMAL LOW (ref 1.7–2.4)

## 2020-12-26 MED ORDER — LEVETIRACETAM 500 MG PO TABS: 500.0000 mg | ORAL_TABLET | Freq: Two times a day (BID) | ORAL | 2 refills | Status: DC

## 2020-12-26 MED ORDER — MAG-OXIDE 200 MG PO TABS: 1.0000 | ORAL_TABLET | Freq: Two times a day (BID) | ORAL | 0 refills | Status: AC

## 2020-12-26 MED ORDER — VITAMIN B-12 1000 MCG PO TABS: ORAL_TABLET | ORAL | 4 refills | Status: DC

## 2020-12-26 MED ORDER — MAGNESIUM SULFATE 2 GM/50ML IV SOLN
2.0000 g | Freq: Once | INTRAVENOUS | Status: DC
  Filled 2020-12-26: qty 50

## 2020-12-26 NOTE — Telephone Encounter (Signed)
Copied from CRM [id number]. Topic: General - Other >> Dec 26, 2020  3:38 PM Tamela Oddi wrote: Reason for CRM: Patient would like the nurse or doctor to call regarding her B12 shot.  She stated she just got out of the hospital and it was high, so she wants to know if she should continue to take the B12.  Please advise and call patient to discuss at [phone]

## 2020-12-26 NOTE — Care Management Important Message (Signed)
Important Message  Patient Details  Name: Kelly Poole MRN: [id number] Date of Birth: 1951-03-14   Medicare Important Message Given:  No  Patient discharged prior to arrival to unit to deliver concurrent Medicare IM.  Initial reviewed by S. Polite, Patient Access Associate on 12/24/2020 at 3:28am.      Johnell Comings 12/26/2020, 12:50 PM

## 2020-12-26 NOTE — Discharge Summary (Signed)
Physician Discharge Summary   OLIVE FATEMI  female DOB: [birth year]  [id number]  PCP: Venita Lick, NP  Admit date: 12/23/2020 Discharge date: 12/26/2020  Admitted From: home Disposition:  home CODE STATUS: Full code  Discharge Instructions    Ambulatory referral to Neurology   Complete by: As directed    New diagnosis of seizure   Discharge instructions   Complete by: As directed    You have new diagnosis of seizure.  Neurologist has prescribed you Keppra 500 mg twice daily.    Please follow up with neurology as outpatient.  Per neurology: Discussed seizure and [facility] laws and patients with seizures should not drive until they have been seizure-free for six months. Discussed seizure safety: Use caution when using heavy equipment or power tools. Avoid working on ladders or at heights. Take showers instead of baths. Ensure the water temperature is not too high on the home water heater. Do not go swimming alone. Do not lock yourself in a room alone (i.e. bathroom). When caring for infants or small children, sit down when holding, feeding, or changing them to minimize risk of injury to the child in the event you have a seizure. Maintain good sleep hygiene. Avoid alcohol.    Dr. Enzo Bi [facility] Course:  For full details, please see H&P, progress notes, consult notes and ancillary notes.  Briefly,  Ysa Evoy Bigelowis a 70 y.o.femalewith medical history significant forstage IIIbchronic kidney disease with baseline creatinine 1.4-1.5, anemia of chronic kidney disease with baseline hemoglobin 9-10, hypertension, hyperlipidemia who presented to the ED on 12/23/2020 after having a witnessed generalized tonic-clonic seizure at home lasting approximately 10 minutes.  On-call neurologist was contacted by the ED physician and recommended EEG, MRI and admission for further evaluation.  MRI of the brain showed anterior right frontal leptomeningeal  contrast-enhancement with subadjacent cortical/subcortical edema without significant mass-effect.  Findings could be seen in infectious or inflammatory meningitis.  Patient was loaded with IV Keppra.  Neurology is following.  New onset seizure  EEG suggestive of cortical dysfunction in the right hemisphere due to underlying structural abnormality or postictal state, mild diffuse encephalopathy, no seizures or defendant elective form discharges were seen.  Neurology consulted.  LP done with labs send, initial results were not concerning for infection.  Pt was started on Keppra 250 mg BID and discharged on Keppra 500 mg BID.  Pt was referred to outpatient neurology for further followup and management.  Acute kidney injury superimposed on CKD stage IIIb  presented with creatinine 1.76, baseline appears around 1.4 back in August.  This could be secondary to rhabdo following seizure.  Pt received MIVF and home losartan was held.  Cr 1.38 back to baseline prior to discharge.  Rhabdomyolysis  secondary to seizure.  CK elevated at 1132.  Pt received MIVF.  Trended down to 808 prior to discharge.  Essential hypertension -chronic.   Takes HCTZ and losartan at home.  Both were held in the setting of AKI.  Oral hydralazine was given for BP control while inpatient.  Home HCTZ and losartan were resumed at discharge since AKI had resolved.  Normocytic anemia -likely anemia of chronic disease given CKD stage IIIb.  Baseline hemoglobin appears around 9-10.  No evidence of active bleeding.  Anemia panel showed no def in vit b12, folate and iron.  Obesity: Body mass index is 31.78 kg/m.  Complicates overall care and prognosis.  Recommend efforts at  lifestyle modification including diet and exercise for weight loss and overall long-term health.   Discharge Diagnoses:  Principal Problem:   Seizure ([place]) Active Problems:   Hypertension   CKD (chronic kidney disease) stage 3, GFR 30-59 ml/min (HCC)    Acute encephalopathy   AKI (acute kidney injury) [facility])    Discharge Instructions:  Allergies as of 12/26/2020      Reactions   Macrobid [nitrofurantoin Macrocrystal] Other (See Comments)   Sore throat and irritation to throat      Medication List    STOP taking these medications   ketoconazole 2 % cream Commonly known as: NIZORAL   TURMERIC PO     TAKE these medications   aspirin 81 MG tablet Take 81 mg by mouth daily.   atorvastatin 10 MG tablet Commonly known as: LIPITOR Take 1 tablet (10 mg total) by mouth daily.   ferrous sulfate 325 (65 FE) MG tablet Take 1 tablet (325 mg total) by mouth daily with breakfast.   fluticasone 50 MCG/ACT nasal spray Commonly known as: FLONASE Place 2 sprays into both nostrils daily for 14 days.   Garlic 123XX123 MG Caps Take 1,000 mg by mouth daily.   hydrochlorothiazide 25 MG tablet Commonly known as: HYDRODIURIL Take 1 tablet (25 mg total) by mouth daily.   levETIRAcetam 500 MG tablet Commonly known as: KEPPRA Take 1 tablet (500 mg total) by mouth 2 (two) times daily. New seizure medication.   losartan 100 MG tablet Commonly known as: COZAAR Take 1 tablet (100 mg total) by mouth daily.   Mag-Oxide 200 MG Tabs Generic drug: Magnesium Oxide Take 1 tablet (200 mg total) by mouth 2 (two) times daily for 7 days. Can take any over-the-counter magnesium supplements.   MULTIVITAMINS PO Take by mouth daily.   omeprazole 20 MG capsule Commonly known as: PRILOSEC Take 1 capsule (20 mg total) by mouth daily.   traZODone 50 MG tablet Commonly known as: DESYREL trazodone 50 mg tablet  TAKE 1 2 TO 1 (ONE HALF TO ONE) TABLET BY MOUTH AT BEDTIME AS NEEDED FOR SLEEP   vitamin B-12 1000 MCG tablet Commonly known as: CYANOCOBALAMIN You vit B12 level is already high, >3000, pls discuss with your provider whether you need to continue. What changed:   how much to take  how to take this  when to take this  additional instructions    vitamin C 1000 MG tablet Take 1,000 mg by mouth daily.   Vitamin D-3 125 MCG (5000 UT) Tabs Take 1 tablet by mouth daily. Takes every other day   vitamin E 45 MG (100 UNITS) capsule Take 100 Units by mouth daily.        Follow-up Information    Vladimir Crofts, MD. Go on 01/08/2021.   Specialty: Neurology Why: 3:45pm appointment Contact information: [facility] [place] [address] [phone]        Venita Lick, NP. Go on 01/06/2021.   Specialty: Nurse Practitioner Why: 9:40am appointment  Contact information: [place] [address] [phone]               Allergies  Allergen Reactions  . Macrobid [Nitrofurantoin Macrocrystal] Other (See Comments)    Sore throat and irritation to throat     The results of significant diagnostics from this hospitalization (including imaging, microbiology, ancillary and laboratory) are listed below for reference.   Consultations:   Procedures/Studies: EEG  Result Date: 12/23/2020 Lora Havens,  MD     12/23/2020  5:08 PM Patient Name: GORDANA KEWLEY MRN: [id number] Epilepsy Attending: Charlsie Quest Referring Physician/Provider: Dr Willy Eddy Date: 12/23/2020 Duration: 24.23 mins Patient history: 70yo F with GTC shaking episode. EEG to evaluate for seizure Level of alertness: Awake AEDs during EEG study: None Technical aspects: This EEG study was done with scalp electrodes positioned according to the 10-20 International system of electrode placement. Electrical activity was acquired at a sampling rate of 500Hz  and reviewed with a high frequency filter of 70Hz  and a low frequency filter of 1Hz . EEG data were recorded continuously and digitally stored. Description: The posterior dominant rhythm consists of 8 Hz activity of moderate voltage (25-35 uV) seen predominantly in posterior head regions, asymmetric ( R<L) and reactive to eye opening and eye closing. EEG  showed continuous 3 to 6 Hz theta-delta slowing in right hemisphere as well as intermittent generalized 2-3hz  delta slowing. Physiologic photic driving was not seen during photic stimulation. Hyperventilation was not performed. ABNORMALITY -Continued slow, right hemisphere - Background asymmetry, right<left IMPRESSION: This study is suggestive of cortical dysfunction in right hemisphere due to underlying structural abnormality or post-ictal state. Additionally there is mild diffuse encephalopathy, non specific etiology. No seizures or definite epileptiform discharges were seen throughout the recording.   CT Head Wo Contrast  Result Date: 12/23/2020 CLINICAL DATA:  Seizure, nontraumatic. Additional history provided: Witnessed seizure, no prior history. EXAM: CT HEAD WITHOUT CONTRAST TECHNIQUE: Contiguous axial images were obtained from the base of the skull through the vertex without intravenous contrast. COMPARISON:  No pertinent prior exams available for comparison. FINDINGS: Brain: Cerebral volume is normal for age. There is no acute intracranial hemorrhage. No demarcated cortical infarct. No extra-axial fluid collection. No evidence of intracranial mass. No midline shift or hydrocephalus. Partially empty sella turcica. The cerebellar tonsils are incompletely imaged. However, the left cerebellar tonsil appears to extend at least 8 mm below the level of the foramen magnum and this may reflect a Chiari 1 malformation. Vascular: No hyperdense vessel.  Atherosclerotic calcifications. Skull: Normal. Negative for fracture or focal lesion. Sinuses/Orbits: Visualized orbits show no acute finding. Mild ethmoid sinus mucosal thickening. IMPRESSION: No CT evidence of acute intracranial abnormality. Given the provided history of first-time seizure, consider a brain MRI for further evaluation. The cerebellar tonsils are incompletely imaged. However, the left cerebellar tonsil appears to extend at least 8 mm  below the level of the foramen magnum, and this may reflect a Chiari 1 malformation. Alternatively, low-lying cerebellar tonsils may be seen in the setting of idiopathic intracranial hypertension. Consider brain MRI for further characterization. Electronically Signed   By: DO   On: 12/23/2020 13:35   MR Brain W and Wo Contrast  Result Date: 12/23/2020 CLINICAL DATA:  Seizure, abnormal neuro exam. EXAM: MRI HEAD WITHOUT AND WITH CONTRAST TECHNIQUE: Multiplanar, multiecho pulse sequences of the brain and surrounding structures were obtained without and with intravenous contrast. CONTRAST:  58mL GADAVIST GADOBUTROL 1 MMOL/ML IV SOLN COMPARISON:  Head CT December 24, 2019. FINDINGS: Brain: Leptomeningeal contrast enhancement within anterior right frontal cerebral sulci with subjacent cortical/subcortical edema without significant mass effect. Remainder of the brain parenchyma is unremarkable. Bilateral Chiari 1 malformation. No acute infarct, hemorrhage or hydrocephalus. Vascular: Normal flow voids. Skull and upper cervical spine: Normal marrow signal. Sinuses/Orbits: Negative. IMPRESSION: Leptomeningeal contrast enhancement within anterior right frontal cerebral sulci with subjacent cortical/subcortical edema without significant mass effect. Findings may represent infectious/inflammatory meningitis in the  appropriate clinical setting. These results will be called to the ordering clinician or representative by the Radiologist Assistant, and communication documented in the PACS or [employer]. Electronically Signed   By: Pedro Earls M.D.   On: 12/23/2020 20:04   DG Chest Portable 1 View  Result Date: 12/23/2020 CLINICAL DATA:  Per MD note, eval for infiltrate. EMS called because pt became unresponsive at caretakers house. Unknown time of episode. EMS states pt had witnessed seizure to which they gave versed 5 mg IV EXAM: PORTABLE CHEST 1 VIEW COMPARISON:  None. FINDINGS: Cardiac  silhouette is normal in size. No mediastinal or hilar masses. No evidence of adenopathy. Linear opacities noted in the lung bases consistent with scarring or atelectasis. Lungs are otherwise clear. No pleural effusion or pneumothorax. Skeletal structures are grossly intact. IMPRESSION: No active disease. Electronically Signed   By: Lajean Manes M.D.   On: 12/23/2020 13:34   DG Lumbar Puncture Fluoro Guide  Result Date: 12/24/2020 CLINICAL DATA:  Seizure activity, possible meningitis EXAM: DIAGNOSTIC LUMBAR PUNCTURE UNDER FLUOROSCOPIC GUIDANCE FLUOROSCOPY TIME:  Fluoroscopy Time:  42 seconds Radiation Exposure Index (if provided by the fluoroscopic device): 21.3 mGy Number of Acquired Spot Images: 1 PROCEDURE: Informed consent was obtained from the patient prior to the procedure, including potential complications of headache, allergy, and pain. With the patient prone, the lower back was prepped with Betadine. 1% Lidocaine was used for local anesthesia. Lumbar puncture was performed at the L4-5 level using a 20 gauge needle with return of clear CSF with an opening pressure of 21 cm water. Fifteen ml of CSF were obtained for laboratory studies. The patient tolerated the procedure well and there were no apparent complications. IMPRESSION: Successful lumbar puncture under fluoroscopic guidance. Samples were sent for laboratory evaluation. Electronically Signed   By: Inez Catalina M.D.   On: 12/24/2020 09:34      Labs: BNP (last 3 results) No results for input(s): BNP in the last 8760 hours. Basic Metabolic Panel: Recent Labs  Lab 12/23/20 1256 12/23/20 1517 12/24/20 0532 12/25/20 0339 12/26/20 0425  NA 138  --  136 138 139  K 4.0  --  4.2 4.0 3.8  CL 104  --  103 106 103  CO2 23  --  23 25 25   GLUCOSE 113*  --  93 99 88  BUN 26*  --  24* 26* 17  CREATININE 1.76*  --  1.52* 1.49* 1.38*  CALCIUM 9.2  --  9.1 9.1 9.3  MG  --  1.8 1.6*  --  1.6*   Liver Function Tests: Recent Labs  Lab  12/23/20 1256 12/24/20 0532  AST 56* 42*  ALT 56* 42  ALKPHOS 87 78  BILITOT 0.6 1.0  PROT 7.9 7.1  ALBUMIN 4.0 3.6   No results for input(s): LIPASE, AMYLASE in the last 168 hours. No results for input(s): AMMONIA in the last 168 hours. CBC: Recent Labs  Lab 12/23/20 1256 12/24/20 0532 12/25/20 0339 12/26/20 0425  WBC 9.8 10.4 9.2 10.7*  NEUTROABS 8.1* 7.5  --   --   HGB 9.0* 8.7* 9.0* 8.9*  HCT 28.0* 27.0* 26.5* 27.1*  MCV 95.2 94.7 92.7 93.8  PLT 269 195 281 271   Cardiac Enzymes: Recent Labs  Lab 12/23/20 1517 12/25/20 0339  CKTOTAL 1,132* 808*   BNP: Invalid input(s): POCBNP CBG: No results for input(s): GLUCAP in the last 168 hours. D-Dimer No results for input(s): DDIMER in the last 72 hours. Hgb  A1c No results for input(s): HGBA1C in the last 72 hours. Lipid Profile No results for input(s): CHOL, HDL, LDLCALC, TRIG, CHOLHDL, LDLDIRECT in the last 72 hours. Thyroid function studies No results for input(s): TSH, T4TOTAL, T3FREE, THYROIDAB in the last 72 hours.  Invalid input(s): FREET3 Anemia work up Recent Labs    12/25/20 0339  VITAMINB12 3,213*  FOLATE 36.0  FERRITIN 220  TIBC 200*  IRON 41  RETICCTPCT 1.2   Urinalysis    Component Value Date/Time   COLORURINE YELLOW (A) 12/23/2020 1538   APPEARANCEUR HAZY (A) 12/23/2020 1538   APPEARANCEUR Clear 07/14/2019 1010   LABSPEC 1.015 12/23/2020 1538   PHURINE 5.0 12/23/2020 1538   GLUCOSEU NEGATIVE 12/23/2020 1538   HGBUR NEGATIVE 12/23/2020 1538   BILIRUBINUR NEGATIVE 12/23/2020 1538   BILIRUBINUR Negative 07/14/2019 1010   KETONESUR NEGATIVE 12/23/2020 1538   PROTEINUR NEGATIVE 12/23/2020 1538   NITRITE NEGATIVE 12/23/2020 1538   LEUKOCYTESUR SMALL (A) 12/23/2020 1538   Sepsis Labs Invalid input(s): PROCALCITONIN,  WBC,  LACTICIDVEN Microbiology Recent Results (from the past 240 hour(s))  SARS CORONAVIRUS 2 (TAT 6-24 HRS) Nasopharyngeal Nasopharyngeal Swab     Status: None    Collection Time: 12/23/20  1:40 PM   Specimen: Nasopharyngeal Swab  Result Value Ref Range Status   SARS Coronavirus 2 NEGATIVE NEGATIVE Final    Comment: (NOTE) SARS-CoV-2 target nucleic acids are NOT DETECTED.  The SARS-CoV-2 RNA is generally detectable in upper and lower respiratory specimens during the acute phase of infection. Negative results do not preclude SARS-CoV-2 infection, do not rule out co-infections with other pathogens, and should not be used as the sole basis for treatment or other patient management decisions. Negative results must be combined with clinical observations, patient history, and epidemiological information. The expected result is Negative.  Fact Sheet for Patients: SugarRoll.be  Fact Sheet for Healthcare Providers: https://www.woods-mathews.com/  This test is not yet approved or cleared by the [place] FDA and  has been authorized for detection and/or diagnosis of SARS-CoV-2 by FDA under an Emergency Use Authorization (EUA). This EUA will remain  in effect (meaning this test can be used) for the duration of the COVID-19 declaration under Se ction 564(b)(1) of the Act, 21 U.S.C. section 360bbb-3(b)(1), unless the authorization is terminated or revoked sooner.  Performed at [facility], [place] [address] [place], [place] [address]   Resp Panel by RT-PCR (Flu A&B, Covid) Nasopharyngeal Swab     Status: None   Collection Time: 12/23/20  8:37 PM   Specimen: Nasopharyngeal Swab; Nasopharyngeal(NP) swabs in vial transport medium  Result Value Ref Range Status   SARS Coronavirus 2 by RT PCR NEGATIVE NEGATIVE Final    Comment: (NOTE) SARS-CoV-2 target nucleic acids are NOT DETECTED.  The SARS-CoV-2 RNA is generally detectable in upper respiratory specimens during the acute phase of infection. The lowest concentration of SARS-CoV-2 viral copies this assay can detect is 138 copies/mL. A negative result  does not preclude SARS-Cov-2 infection and should not be used as the sole basis for treatment or other patient management decisions. A negative result may occur with  improper specimen collection/handling, submission of specimen other than nasopharyngeal swab, presence of viral mutation(s) within the areas targeted by this assay, and inadequate number of viral copies(<138 copies/mL). A negative result must be combined with clinical observations, patient history, and epidemiological information. The expected result is Negative.  Fact Sheet for Patients:  EntrepreneurPulse.com.au  Fact Sheet for Healthcare Providers:  IncredibleEmployment.be  This  test is no t yet approved or cleared by the [place] and  has been authorized for detection and/or diagnosis of SARS-CoV-2 by FDA under an Emergency Use Authorization (EUA). This EUA will remain  in effect (meaning this test can be used) for the duration of the COVID-19 declaration under Section 564(b)(1) of the Act, 21 U.S.C.section 360bbb-3(b)(1), unless the authorization is terminated  or revoked sooner.       Influenza A by PCR NEGATIVE NEGATIVE Final   Influenza B by PCR NEGATIVE NEGATIVE Final    Comment: (NOTE) The Xpert Xpress SARS-CoV-2/FLU/RSV plus assay is intended as an aid in the diagnosis of influenza from Nasopharyngeal swab specimens and should not be used as a sole basis for treatment. Nasal washings and aspirates are unacceptable for Xpert Xpress SARS-CoV-2/FLU/RSV testing.  Fact Sheet for Patients: EntrepreneurPulse.com.au  Fact Sheet for Healthcare Providers: IncredibleEmployment.be  This test is not yet approved or cleared by the [place] FDA and has been authorized for detection and/or diagnosis of SARS-CoV-2 by FDA under an Emergency Use Authorization (EUA). This EUA will remain in effect (meaning this test can be used) for  the duration of the COVID-19 declaration under Section 564(b)(1) of the Act, 21 U.S.C. section 360bbb-3(b)(1), unless the authorization is terminated or revoked.  Performed at [facility], [address] [place], [place] [address]   CSF culture     Status: None (Preliminary result)   Collection Time: 12/24/20  9:03 AM   Specimen: PATH Cytology CSF; Cerebrospinal Fluid  Result Value Ref Range Status   Specimen Description   Final    CSF Performed at [facility], [address], [place], [place] [address]    Special Requests   Final    NONE Performed at [facility], [address] [place], [place] [address]    Gram Stain   Final    FEW WBC SEEN NO ORGANISMS SEEN Performed at [facility], [address], [place], [place] [address]    Culture   Final    NO GROWTH 2 DAYS Performed at [facility], [place] [address], [place], [place] [address]    Report Status PENDING  Incomplete     Total time spend on discharging this patient, including the last patient exam, discussing the hospital stay, instructions for ongoing care as it relates to all pertinent caregivers, as well as preparing the medical discharge records, prescriptions, and/or referrals as applicable, is 40 minutes.    Enzo Bi, MD  [facility] 12/26/2020, 11:07 AM

## 2020-12-26 NOTE — Telephone Encounter (Signed)
She can stop taking for now and we will continue to monitor.

## 2020-12-26 NOTE — Progress Notes (Signed)
D/C home order verified.  Per MD do not need to give IV Magnesium- ordered PO Magnesium per AVS.  AVS reviewed with patient, appointments, medicines. Verbalized understanding. Called for volunteer to pick up patient in wheelchair.  Patient meeting brother at medical mall entrance, he will be driving.

## 2020-12-26 NOTE — Telephone Encounter (Signed)
Routing to provider to advise.  

## 2020-12-27 ENCOUNTER — Telehealth: Payer: Self-pay

## 2020-12-27 LAB — CSF CULTURE W GRAM STAIN: Culture: NO GROWTH

## 2020-12-27 NOTE — Telephone Encounter (Signed)
Transition Care Management Follow-up Telephone Call  Date of discharge and from where: 12/26/2020 [facility]  How have you been since you were released from the hospital? Had a slight headache  Any questions or concerns? No  Items Reviewed:  Did the pt receive and understand the discharge instructions provided? Yes   Medications obtained and verified? Yes   Other? No   Any new allergies since your discharge? No   Dietary orders reviewed? Yes  Do you have support at home? Yes   Home Care and Equipment/Supplies: Were home health services ordered? not applicable If so, what is the name of the agency? n/a  Has the agency set up a time to come to the patient's home? not applicable Were any new equipment or medical supplies ordered?  No What is the name of the medical supply agency? n/a Were you able to get the supplies/equipment? not applicable Do you have any questions related to the use of the equipment or supplies? No  Functional Questionnaire: (I = Independent and D = Dependent) ADLs: I  Bathing/Dressing- I  Meal Prep- I  Eating- I  Maintaining continence- I  Transferring/Ambulation- I  Managing Meds- I  Follow up appointments reviewed:   PCP Hospital f/u appt confirmed? Yes  Scheduled to see Dr. Wynetta Emery  on 01/06/2021 @ 9:40.  [facility] f/u appt confirmed? Yes  Scheduled to see Dr. Jennings Books on 01/08/2021 @ 3:45.  Are transportation arrangements needed? No   If their condition worsens, is the pt aware to call PCP or go to the Emergency Dept.? Yes  Was the patient provided with contact information for the PCP's office or ED? Yes  Was to pt encouraged to call back with questions or concerns? Yes

## 2020-12-27 NOTE — Telephone Encounter (Signed)
Patient notified

## 2020-12-31 ENCOUNTER — Encounter: Payer: Self-pay | Admitting: Family Medicine

## 2020-12-31 ENCOUNTER — Ambulatory Visit: Payer: Self-pay | Admitting: Nurse Practitioner

## 2020-12-31 ENCOUNTER — Other Ambulatory Visit: Payer: Self-pay

## 2020-12-31 ENCOUNTER — Ambulatory Visit (INDEPENDENT_AMBULATORY_CARE_PROVIDER_SITE_OTHER): Payer: Medicare HMO | Admitting: Family Medicine

## 2020-12-31 VITALS — BP 129/81 | HR 80 | Temp 98.5°F | Ht <= 58 in | Wt 139.4 lb

## 2020-12-31 DIAGNOSIS — R569 Unspecified convulsions: Secondary | ICD-10-CM | POA: Diagnosis not present

## 2020-12-31 DIAGNOSIS — D649 Anemia, unspecified: Secondary | ICD-10-CM | POA: Diagnosis not present

## 2020-12-31 NOTE — Patient Instructions (Signed)
It was great to see you!  Our plans for today:  - Continue iron supplements and keppra.  - Follow up as scheduled with the Neurologist. - We are referring you to hematology (blood doctor) to further work up your anemia.   Take care and seek immediate care sooner if you develop any concerns.   Dr. Ky Barban

## 2020-12-31 NOTE — Assessment & Plan Note (Signed)
Seen today for hosp f/u. No further seizures. Continue keppra, f/u with neuro as scheduled, await autoimmune/paraneoplastic panel.

## 2020-12-31 NOTE — Assessment & Plan Note (Signed)
Hb remains 8-9 with normal iron, ferritin. Perhaps CKD etiology. Does not appear to have had prior hematologic workup in the past, will refer for further evaluation. Continue iron supplementation.

## 2020-12-31 NOTE — Progress Notes (Signed)
    SUBJECTIVE:   CHIEF COMPLAINT / HPI:   Patient Active Problem List   Diagnosis Date Noted  . Acute encephalopathy 12/24/2020  . AKI (acute kidney injury) ([place]) 12/24/2020  . Secondary hyperparathyroidism ([place]) 12/23/2020  . Seizure ([place]) 12/23/2020  . Anemia 07/30/2020  . Osteoarthritis of right knee 09/07/2019  . Vasomotor symptoms due to menopause 03/24/2019  . CKD (chronic kidney disease) stage 3, GFR 30-59 ml/min (HCC) 10/05/2016  . Vitamin B12 deficiency (dietary) anemia 12/27/2015  . Hypertension 12/27/2015  . Insomnia 06/03/2015  . Thyroid goiter 06/03/2015  . HX: breast cancer 06/03/2015  . Gastroesophageal reflux disease 06/03/2015  . Hyperlipidemia 06/03/2015   HOSPITAL FOLLOW UP Admitted to [facility] 1/3-1/6 for witnessed 58min tonic-clonic seizure. Provided midazolam PTA, postictal in ED. No prior h/o seizure. Time since discharge: 5 days Hospital/facility: [facility] Diagnosis: seizure Procedures/tests:  - EEG continuous slowing over R hemisphere, no epileptiform discharges - MRI brain - restricted diffusion leptomeningeal enhancement along anterior right frontal cerebral sulci with subjacent cortical/subcortical edema on T2 without significant mass effect - CXR negative - LP unrevealing Consultants: neuro New medications:  - keppra 500mg  BID Discharge instructions:   - f/u with epileptologist - f/u MRI 4-6 wks if autoimmune/paraneoplastic panel unrevealing. Status: better  - has neuro f/u on 1/19, still awaiting send out labs - tolerating keppra ok but with slight headache and dizziness. No falls.   Anemia - chronic duration.  - anemia panel in hospital unrevealing, nl iron levels. B12 levels supratherapeutic, has stopped supplementation.  - no blood in stool, vaginal bleeding.  - remains on iron supplementation - no chest pain, trouble breathing.  - sometimes will get tired with up and moving around.  - denies FH of sickle cell anemia.   OBJECTIVE:   BP  129/81   Pulse 80   Temp 98.5 F (36.9 C) (Oral)   Ht 4\' 9"  (1.448 m)   Wt 139 lb 6.4 oz (63.2 kg)   SpO2 100%   BMI 30.17 kg/m   Gen: well appearing, in NAD Card: RRR Lungs: CTAB MSK: Full ROM, strength 5/5 to U/LE bilaterally, normal gait.  No edema.  Neuro: Alert and oriented, speech normal.  PERRL, Extraocular movements intact.  Intact symmetric sensation to light touch of face and extremities bilaterally.  Hearing grossly intact bilaterally.  Tongue protrudes normally with no deviation.  Shoulder shrug, smile symmetric. Finger to nose normal.   ASSESSMENT/PLAN:   Anemia Hb remains 8-9 with normal iron, ferritin. Perhaps CKD etiology. Does not appear to have had prior hematologic workup in the past, will refer for further evaluation. Continue iron supplementation.   Seizure [facility]) Seen today for hosp f/u. No further seizures. Continue keppra, f/u with neuro as scheduled, await autoimmune/paraneoplastic panel.     Myles Gip, DO

## 2021-01-01 NOTE — Telephone Encounter (Signed)
I let pt know that eating liver will help her iron level because liver is full of iron. "My iron level is low so I just wanted to double check".   "I thought eating liver would help".  She did not have any other questions and thanked me for calling her back.  Reason for Disposition . General information question, no triage required and triager able to answer question  Answer Assessment - Initial Assessment Questions 1. REASON FOR CALL or QUESTION: "What is your reason for calling today?" or "How can I best help you?" or "What question do you have that I can help answer?"     Will eating liver help my iron?  Protocols used: INFORMATION ONLY CALL - NO TRIAGE-A-AH

## 2021-01-02 ENCOUNTER — Telehealth: Payer: Self-pay

## 2021-01-02 NOTE — Telephone Encounter (Signed)
Please advise 

## 2021-01-02 NOTE — Telephone Encounter (Signed)
Copied from [place] [id number]. Topic: General - Inquiry >> Jan 02, 2021  1:35 PM Greggory Keen D wrote: Reason for CRM: Pt called asking if she can take the supplement Co-Q-10.  CB#  [phone] Pt had apt on 12/31/2020

## 2021-01-02 NOTE — Telephone Encounter (Signed)
Patient aware.

## 2021-01-02 NOTE — Telephone Encounter (Signed)
Due to her recent hospitalization with new seizure onset I would avoid adding on any new supplement at this time, in the future we can readdress this.

## 2021-01-03 ENCOUNTER — Encounter: Payer: Self-pay | Admitting: *Deleted

## 2021-01-03 ENCOUNTER — Ambulatory Visit: Payer: Medicare HMO | Admitting: Pharmacist

## 2021-01-03 ENCOUNTER — Telehealth: Payer: Self-pay

## 2021-01-03 DIAGNOSIS — I10 Essential (primary) hypertension: Secondary | ICD-10-CM

## 2021-01-03 DIAGNOSIS — N1832 Chronic kidney disease, stage 3b: Secondary | ICD-10-CM

## 2021-01-03 NOTE — Telephone Encounter (Signed)
Routing to provider to clarify. Is patient supposed to be taking half or a whole tablet of HCTZ?

## 2021-01-03 NOTE — Chronic Care Management (AMB) (Signed)
Chronic Care Management Pharmacy  Name: DAVENE JOBIN  MRN: [id number] DOB: 04-23-1951   Chief Complaint/ HPI  Kelly Poole,  70 y.o. , female presents for their Follow-Up CCM visit with the clinical pharmacist via telephone.  PCP : Venita Lick, NP Patient Care Team: Venita Lick, NP as PCP - General (Nurse Practitioner) Vanita Ingles, RN as Case Manager ([place]) Vladimir Faster, [facility] (Pharmacist)  Their chronic conditions include: Hypertension, Hyperlipidemia, GERD, Chronic Kidney Disease and Osteoarthritis Anemia of CKD, secondary hyperparathyroidsim  Office Visits: 12/10/20- Dr. Ky Barban- rt ear pain--no infection- Flonase 07/30/20- Marnee Guarneri, NP- blood work 05/17/20- Courtney Heys, PharmD - GFr ~42, patient has wrist cuff for BP - bring in for comparison  Consult Visit: 12/31/20- Dr. Ky Barban- referral to hemaotlogy 12/23/20- 12/26/20-[facility] ED to admission- siezure-like activity, MRI-meningocele- meningitis suspected- lumbar puncture cx negative, CK 1132 on admission, dc on Keppra 500 mg bid 12/19/20- Dr. Juleen China, nephrology- Scr 1.69, eGFR~31-bloodwork, hctz 25 mg qd 09/04/20- Dr. Juleen China, Nephrology -3A CKD, normocytic anemia, return 4 weeks  Allergies  Allergen Reactions  . Macrobid [Nitrofurantoin Macrocrystal] Other (See Comments)    Sore throat and irritation to throat    Medications: Outpatient Encounter Medications as of 01/03/2021  Medication Sig Note  . Ascorbic Acid (VITAMIN C) 1000 MG tablet Take 1,000 mg by mouth daily.   Marland Kitchen aspirin 81 MG tablet Take 81 mg by mouth daily.   Marland Kitchen atorvastatin (LIPITOR) 10 MG tablet Take 1 tablet (10 mg total) by mouth daily.   . Cholecalciferol (VITAMIN D-3) 125 MCG (5000 UT) TABS Take 1 tablet by mouth daily. Takes every other day   . ferrous sulfate 325 (65 FE) MG tablet Take 1 tablet (325 mg total) by mouth daily with breakfast.   . fluticasone (FLONASE) 50 MCG/ACT nasal spray Place 2 sprays into both  nostrils daily for 14 days.   . Garlic 0272 MG CAPS Take 1,000 mg by mouth daily. 10/12/2019: Doesn't take regular  . hydrochlorothiazide (HYDRODIURIL) 25 MG tablet Take 1 tablet (25 mg total) by mouth daily.   Marland Kitchen ibuprofen (ADVIL) 200 MG tablet Take by mouth.   . levETIRAcetam (KEPPRA) 500 MG tablet Take 1 tablet (500 mg total) by mouth 2 (two) times daily. New seizure medication.   Marland Kitchen losartan (COZAAR) 100 MG tablet Take 1 tablet (100 mg total) by mouth daily.   . Multiple Vitamin (MULTIVITAMINS PO) Take by mouth daily.   Marland Kitchen omeprazole (PRILOSEC) 20 MG capsule Take 1 capsule (20 mg total) by mouth daily.   . traZODone (DESYREL) 50 MG tablet trazodone 50 mg tablet  TAKE 1 2 TO 1 (ONE HALF TO ONE) TABLET BY MOUTH AT BEDTIME AS NEEDED FOR SLEEP   . vitamin E 100 UNIT capsule Take 100 Units by mouth daily.    No facility-administered encounter medications on file as of 01/03/2021.    Wt Readings from Last 3 Encounters:  12/31/20 139 lb 6.4 oz (63.2 kg)  12/25/20 141 lb 12.1 oz (64.3 kg)  12/10/20 140 lb (63.5 kg)    Current Diagnosis/Assessment:    Goals Addressed   None    New onset seizure   Patient has failed these meds in past: NA Patient is currently controlled on the following medications:  Marland Kitchen Keppra 500 mg bid . Magnesium 200 mg bid x 7 days  We discussed: Patient has follow up appt with neurology on 01/08/21. She can not drive for 6 months. She hopes that neurology  will release her to go back to work. She is concerned about transportation and paying her bills. I will reach out to LCSW for community resource referral. She reports occassional dizziness with Keppra but otherwise she feels well. She states she is taking her last dose of magnesium today. Mag level was 1.6 in hospital  Plan  Continue current medications   Hypertension/CKD IIIB   BP goal is:  <130/80  Office blood pressures are  BP Readings from Last 3 Encounters:  12/31/20 129/81  12/26/20 (!) 148/79   12/10/20 (!) 158/92   CMP Latest Ref Rng & Units 12/26/2020 12/25/2020 12/24/2020  Glucose 70 - 99 mg/dL 88 99 93  BUN 8 - 23 mg/dL 17 26(H) 24(H)  Creatinine 0.44 - 1.00 mg/dL 1.38(H) 1.49(H) 1.52(H)  Sodium 135 - 145 mmol/L 139 138 136  Potassium 3.5 - 5.1 mmol/L 3.8 4.0 4.2  Chloride 98 - 111 mmol/L 103 106 103  CO2 22 - 32 mmol/L _0 Calcium 8.9 - 10.3 mg/dL 9.3 9.1 9.1  Total Protein 6.5 - 8.1 g/dL - - 7.1  Total Bilirubin 0.3 - 1.2 mg/dL - - 1.0  Alkaline Phos 38 - 126 U/L - - 78  AST 15 - 41 U/L - - 42(H)  ALT 0 - 44 U/L - - 42    Patient checks BP at home weekly Patient home BP readings are ranging: 121/70  Patient has failed these meds in the past: daily Patient is currently controlled on the following medications:  . Losartan 100 mg qd . HCTZ 25  Mg qd   We discussed: Patient reports she was out of BP medications for 5 days prior to seizure. She had not been checking BP at home prior to incident. She is checking at home now and states it has been "good".  Patient has transferred to [facility] and has adequate supply of her medications except aspirin and iron. She will ask her brother to take her to buy these. She eats a mostly healthy diet consisting of lots of vegetables and is incorporating foods in renal protective diet handout given by Nephrology. Follow-up visit with nephrololgy 12/19/20 BP was 143/85. Losartan, HCTZ 25 mg continued. She is eager to go back to the gym.  Plan  Continue current medications     Hyperlipidemia   LDL goal < 100  Lipid Panel     Component Value Date/Time   CHOL 201 (H) 07/30/2020 1540   CHOL 178 06/03/2015 1127   TRIG 207 (H) 07/30/2020 1540   TRIG 93 06/03/2015 1127   HDL 51 07/30/2020 1540   LDLCALC 114 (H) 07/30/2020 1540    Hepatic Function Latest Ref Rng & Units 12/24/2020 12/23/2020 02/16/2020  Total Protein 6.5 - 8.1 g/dL 7.1 7.9 7.7  Albumin 3.5 - 5.0 g/dL 3.6 4.0 4.7  AST 15 - 41 U/L 42(H) 56(H) 25  ALT 0 - 44  U/L 42 56(H) 32  Alk Phosphatase 38 - 126 U/L 78 87 98  Total Bilirubin 0.3 - 1.2 mg/dL 1.0 0.6 <0.2     The 10-year ASCVD risk score Mikey Bussing DC Jr., et al., 2013) is: 10.5%   Values used to calculate the score:     Age: 39 years     Sex: Female     Is Non-Hispanic African American: Yes     Diabetic: No     Tobacco smoker: No     Systolic Blood Pressure: 637 mmHg     Is BP  treated: Yes     HDL Cholesterol: 51 mg/dL     Total Cholesterol: 201 mg/dL   Patient has failed these meds in past: NA Patient is currently uncontrolled on the following medications:  . Atorvastatin 10 mg qd . Aspirin 81 mg qd  We discussed:  diet and exercise extensively. Reviewed LDL is slightly elevated and may need to increase dose of atorvastatin at next PCP appointment. Patient CK level >1100 in hospital.  Atorvastatin restarted at discharge.  Plan  Continue current medications. Consider increasing atorvastatin to 20 mg if still elevated at next visit.  GERD   Patient has failed these meds in past: NA Patient is currently controlled on the following medications:  . Omeprazole 40 mg qd  We discussed:  Patient reports she is symptom free. Magnesium level 1.6 during recent hospitalization. Patient prescribed MagOX 200 mg bid x 7 days and will take her last dose today. She follows up with Neuro on Wednesday.  Plan  Continue current medications.  Anemia of CKD   Patient has failed these meds in past: NA Patient is currently query  controlled on the following medications:  . Ferrous sulfate 325 mg qd  We discussed:  Patient referred to hematology for  Anemia at last PCP appt. This has not been scheduled. Her  8.9/27.1 plt 271 in hospital. Plan  Continue current medications  B12 Deficiency   Patient has failed these meds in past: NA Patient is currently controlled on the following medications:  . B12 injections on hold  We discussed:  B12 level was elevated 3,213 in hospital and  supplementation is on hold for now.  Plan  Continue current management strategy.   Medication Management   Pt uses [facility] mail order[facility] pharmacy for all medications Pt endorses 95% compliance  We discussed: Current pharmacy is preferred with insurance plan and patient is satisfied with pharmacy services  Plan  Continue current medication management strategy. Will discuss anemia, and B12 deficinency at DEXA screening  at next appointment.    Follow up: 3  month phone visit  Junita Push. Kenton Kingfisher PharmD, [facility] [phone]

## 2021-01-03 NOTE — Patient Instructions (Addendum)
Visit Information  It was a pleasure speaking with you today. Thank you for letting me be part of your clinical team. Please call with any questions or concerns.   Goals Addressed            This Visit's Progress   . Pharmacy Care Plan       CARE PLAN ENTRY (see longitudinal plan of care for additional care plan information)  Current Barriers:  . Chronic Disease Management support, education, and care coordination needs related to Hypertension, Hyperlipidemia, GERD, Chronic Kidney Disease, Osteoarthritis, and Anemia and B12 deficiniency   Hypertension/CKD IIIB BP Readings from Last 3 Encounters:  12/31/20 129/81  12/26/20 (!) 148/79  12/10/20 (!) 158/92   . Pharmacist Clinical Goal(s): o Over the next 90 days, patient will work with PharmD and providers to achieve BP goal <130/80 . Current regimen:  . Losartan 100 mg qd . HCTZ 25  Mg qd  . Interventions: o Reviewed proper BP technique including  sitting down for at least 5 minutes before, resting calmly, with your feet flat on the floor.    o Provided diet and exercise counseling. o Reviewed fill history and medication management strategy.  . Patient self care activities - Over the next 90 days, patient will: o Check BP 5 times weekly, document, and provide at future appointments o Ensure daily salt intake < 2300 mg/day o Contact PharmD or PCP if unable to obtain RX  Hyperlipidemia Lab Results  Component Value Date/Time   LDLCALC 114 (H) 07/30/2020 03:40 PM   . Pharmacist Clinical Goal(s): o Over the next 90 days, patient will work with PharmD and providers to achieve LDL goal < 100 . Current regimen:  o Atorvastatin 10 mg qd o Aspirin 81 mg qd . Interventions: o Provided diet and exercise counseling. o Discussed goal LDL and potential increase of statin dose . Patient self care activities - Over the next 90 days, patient will: o Cut back on fried chicken intake and avoid eating the skin. o Continue exercise  regimen incorporating weight training  New onset seizure-like activity . Pharmacist Clinical Goal(s) o Over the next 60 days, patient will work with PharmD and providers to optimize therapy . Current regimen:  o Keppra 500 mg bid  . Interventions: o Reviewed  Ae's of Keppra and discussed MOA o Reviewed Neuro appt follow up o Will collaborate with LCSW/RNCM for community resource  assistance with transportation a food. (patient can't drive for 6 months) . Patient self care activities - Over the next 60 days, patient will: o Attend all scheduled visits o Take medication as prescribed   Medication management . Pharmacist Clinical Goal(s): o Over the next 90  days, patient will work with PharmD and providers to achieve optimal medication adherence . Current pharmacy: [facility] . Interventions o Comprehensive medication review performed. o Continue current medication management strategy . Patient self care activities - Over the next 90 days, patient will: o Focus on medication adherence by fill dates o Take medications as prescribed o Report any questions or concerns to PharmD and/or provider(s)  Please see past updates related to this goal by clicking on the "Past Updates" button in the selected goal         The patient verbalized understanding of instructions, educational materials, and care plan provided today and agreed to receive a mailed copy of patient instructions, educational materials, and care plan.   Telephone follow up appointment with pharmacy team member scheduled for: 2  months  Junita Push. Kenton Kingfisher PharmD, BCPS Clinical Pharmacist [phone]  Food Basics for Chronic Kidney Disease Chronic kidney disease (CKD) occurs when the kidneys are permanently damaged over a long period of time. When your kidneys are not working well, they cannot remove waste, fluids, and other substances from your blood as well as they did before. The substances can build up,  which can worsen kidney damage and affect how your body functions. Certain foods lead to a buildup of these substances. By changing your diet, you can help prevent more kidney damage and delay or prevent the need for dialysis. What are tips for following this plan? Reading food labels  Check the amount of salt (sodium) in foods. Choose foods that have less than 300 milligrams (mg) per serving.  Check the ingredient list for phosphorus or potassium-based additives or preservatives.  Check the amount of saturated fat and trans fat. Limit or avoid these fats as told by your dietitian. Shopping  Avoid buying foods that are: ? Processed or prepackaged. ? Calcium-enriched or that have calcium added to them (are fortified).  Do not buy foods that have salt or sodium listed among the first five ingredients.  Buy canned vegetables and beans that say "no salt added" or "low sodium" and rinse them before eating. Cooking  Soak vegetables, such as potatoes, before cooking to reduce potassium. To do this: 1. Peel and cut the vegetables into small pieces. 2. Soak the vegetables in warm water for at least 2 hours. For every 1 cup of vegetables, use 10 cups of water. 3. Drain and rinse the vegetables with warm water. 4. Boil the vegetables for at least 5 minutes. Meal planning  Limit the amount of protein you eat from plant and animal sources each day.  Do not add salt to food when cooking or before eating.  Eat meals and snacks at around the same time each day. General information  Talk with your health care provider about whether you should take a vitamin and mineral supplement.  Use standard measuring cups and spoons to measure servings of foods. Use a kitchen scale to measure portions of protein foods.  If told by your health care provider, avoid drinking too much fluid. Measure and count all liquids, including water, ice, soups, flavored gelatin, and frozen desserts such as ice pops or ice  cream. If you have diabetes:  If you have diabetes (diabetes mellitus) and CKD, it is important to keep your blood sugar (glucose) in the target range recommended by your health care provider. Follow your diabetes management plan. This may include: ? Checking your blood glucose regularly. ? Taking medicines by mouth, taking insulin, or taking both. ? Exercising for at least 30 minutes on 5 or more days each week, or as told by your health care provider. ? Tracking how many servings of carbohydrates you eat at each meal.  You may be given specific guidelines on how much of certain foods and nutrients you may eat, depending on your stage of kidney disease and whether you have high blood pressure (hypertension). Follow your meal plan as told by your dietitian. What nutrients should I limit? Work with your health care provider and dietitian to develop a meal plan that is right for you. Foods you can eat and foods you should limit or avoid will depend on the stage of your kidney disease and any other health conditions you have. The items listed below are not a complete list. Talk with your dietitian about  what dietary choices are best for you. Potassium Potassium affects how steadily your heart beats. If too much potassium builds up in your blood, the potassium can cause an irregular heartbeat or even a heart attack. You may need to limit or avoid foods that are high in potassium, such as:  Milk and soy milk.  Fruits, such as bananas, apricots, nectarines, melon, prunes, raisins, kiwi, and oranges.  Vegetables, such as potatoes, sweet potatoes, yams, tomatoes, leafy greens, beets, avocado, pumpkin, and winter squash.  White and lima beans.  Whole-wheat breads and pastas.  Beans and nuts. Phosphorus Phosphorus is a mineral found in your bones. A balance between calcium and phosphorus is needed to build and maintain healthy bones. Too much phosphorus pulls calcium from your bones. This can make  your bones weak and more likely to break. Too much phosphorus can also make your skin itch. You may need to limit or avoid foods that are high in phosphorus, such as:  Milk and dairy products.  Dried beans and peas.  Tofu, soy milk, and other soy-based meat replacements.  Dark-colored sodas.  Nuts and peanut butter.  Meat, poultry, and fish.  Bran cereals and oatmeal. Protein Protein helps you make and keep muscle. It also helps to repair your body's cells and tissues. One of the natural breakdown products of protein is a waste product called urea. When your kidneys are not working properly, they cannot remove wastes, such as urea. Reducing how much protein you eat can help prevent a buildup of urea in your blood. Depending on your stage of kidney disease, you may need to limit foods that are high in protein. Sources of animal protein include:  Meat (all types).  Fish and seafood.  Poultry.  Eggs.  Dairy. Other protein foods include:  Beans and legumes.  Nuts and nut butter.  Soy and tofu.   Sodium Sodium helps to maintain a healthy balance of fluids in your body. Too much sodium can increase your blood pressure and have a negative effect on your heart and lungs. Too much sodium can also cause your body to retain too much fluid, making your kidneys work harder. Most people should have less than 2,300 mg of sodium each day. If you have hypertension, you may need to limit your sodium to 1,500 mg each day. You may need to limit or avoid foods that are high in sodium, such as:  Salt seasonings.  Soy sauce.  Cured and processed meats.  Salted crackers and snack foods.  Fast food.  Canned soups and most canned foods.  Pickled foods.  Vegetable juice.  Boxed mixes or ready-to-eat boxed meals and side dishes.  Bottled dressings, sauces, and marinades. Talk with your dietitian about how much potassium, phosphorus, protein, and sodium you may have each  day. Summary  Chronic kidney disease (CKD) can lead to a buildup of waste and extra substances in the body. Certain foods lead to a buildup of these substances. By changing your diet as told, you can help prevent more kidney damage and delay or prevent the need for dialysis.  Food intake changes are different for each person with CKD. Work with a dietitian to set up nutrient goals and a meal plan that is right for you.  If you have diabetes and CKD, it is important to keep your blood sugar in the target range recommended by your health care provider. This information is not intended to replace advice given to you by your health care provider.  Make sure you discuss any questions you have with your health care provider. Document Revised: 04/01/2020 Document Reviewed: 04/01/2020 Elsevier Patient Education  2021 [place] for Chronic Kidney Disease Chronic kidney disease (CKD) occurs when the kidneys are permanently damaged over a long period of time. When your kidneys are not working well, they cannot remove waste, fluids, and other substances from your blood as well as they did before. The substances can build up, which can worsen kidney damage and affect how your body functions. Certain foods lead to a buildup of these substances. By changing your diet, you can help prevent more kidney damage and delay or prevent the need for dialysis. What are tips for following this plan? Reading food labels  Check the amount of salt (sodium) in foods. Choose foods that have less than 300 milligrams (mg) per serving.  Check the ingredient list for phosphorus or potassium-based additives or preservatives.  Check the amount of saturated fat and trans fat. Limit or avoid these fats as told by your dietitian. Shopping  Avoid buying foods that are: ? Processed or prepackaged. ? Calcium-enriched or that have calcium added to them (are fortified).  Do not buy foods that have salt or sodium  listed among the first five ingredients.  Buy canned vegetables and beans that say "no salt added" or "low sodium" and rinse them before eating. Cooking  Soak vegetables, such as potatoes, before cooking to reduce potassium. To do this: 1. Peel and cut the vegetables into small pieces. 2. Soak the vegetables in warm water for at least 2 hours. For every 1 cup of vegetables, use 10 cups of water. 3. Drain and rinse the vegetables with warm water. 4. Boil the vegetables for at least 5 minutes. Meal planning  Limit the amount of protein you eat from plant and animal sources each day.  Do not add salt to food when cooking or before eating.  Eat meals and snacks at around the same time each day. General information  Talk with your health care provider about whether you should take a vitamin and mineral supplement.  Use standard measuring cups and spoons to measure servings of foods. Use a kitchen scale to measure portions of protein foods.  If told by your health care provider, avoid drinking too much fluid. Measure and count all liquids, including water, ice, soups, flavored gelatin, and frozen desserts such as ice pops or ice cream. If you have diabetes:  If you have diabetes (diabetes mellitus) and CKD, it is important to keep your blood sugar (glucose) in the target range recommended by your health care provider. Follow your diabetes management plan. This may include: ? Checking your blood glucose regularly. ? Taking medicines by mouth, taking insulin, or taking both. ? Exercising for at least 30 minutes on 5 or more days each week, or as told by your health care provider. ? Tracking how many servings of carbohydrates you eat at each meal.  You may be given specific guidelines on how much of certain foods and nutrients you may eat, depending on your stage of kidney disease and whether you have high blood pressure (hypertension). Follow your meal plan as told by your dietitian. What  nutrients should I limit? Work with your health care provider and dietitian to develop a meal plan that is right for you. Foods you can eat and foods you should limit or avoid will depend on the stage of your kidney disease and any other health conditions  you have. The items listed below are not a complete list. Talk with your dietitian about what dietary choices are best for you. Potassium Potassium affects how steadily your heart beats. If too much potassium builds up in your blood, the potassium can cause an irregular heartbeat or even a heart attack. You may need to limit or avoid foods that are high in potassium, such as:  Milk and soy milk.  Fruits, such as bananas, apricots, nectarines, melon, prunes, raisins, kiwi, and oranges.  Vegetables, such as potatoes, sweet potatoes, yams, tomatoes, leafy greens, beets, avocado, pumpkin, and winter squash.  White and lima beans.  Whole-wheat breads and pastas.  Beans and nuts. Phosphorus Phosphorus is a mineral found in your bones. A balance between calcium and phosphorus is needed to build and maintain healthy bones. Too much phosphorus pulls calcium from your bones. This can make your bones weak and more likely to break. Too much phosphorus can also make your skin itch. You may need to limit or avoid foods that are high in phosphorus, such as:  Milk and dairy products.  Dried beans and peas.  Tofu, soy milk, and other soy-based meat replacements.  Dark-colored sodas.  Nuts and peanut butter.  Meat, poultry, and fish.  Bran cereals and oatmeal. Protein Protein helps you make and keep muscle. It also helps to repair your body's cells and tissues. One of the natural breakdown products of protein is a waste product called urea. When your kidneys are not working properly, they cannot remove wastes, such as urea. Reducing how much protein you eat can help prevent a buildup of urea in your blood. Depending on your stage of kidney  disease, you may need to limit foods that are high in protein. Sources of animal protein include:  Meat (all types).  Fish and seafood.  Poultry.  Eggs.  Dairy. Other protein foods include:  Beans and legumes.  Nuts and nut butter.  Soy and tofu.   Sodium Sodium helps to maintain a healthy balance of fluids in your body. Too much sodium can increase your blood pressure and have a negative effect on your heart and lungs. Too much sodium can also cause your body to retain too much fluid, making your kidneys work harder. Most people should have less than 2,300 mg of sodium each day. If you have hypertension, you may need to limit your sodium to 1,500 mg each day. You may need to limit or avoid foods that are high in sodium, such as:  Salt seasonings.  Soy sauce.  Cured and processed meats.  Salted crackers and snack foods.  Fast food.  Canned soups and most canned foods.  Pickled foods.  Vegetable juice.  Boxed mixes or ready-to-eat boxed meals and side dishes.  Bottled dressings, sauces, and marinades. Talk with your dietitian about how much potassium, phosphorus, protein, and sodium you may have each day. Summary  Chronic kidney disease (CKD) can lead to a buildup of waste and extra substances in the body. Certain foods lead to a buildup of these substances. By changing your diet as told, you can help prevent more kidney damage and delay or prevent the need for dialysis.  Food intake changes are different for each person with CKD. Work with a dietitian to set up nutrient goals and a meal plan that is right for you.  If you have diabetes and CKD, it is important to keep your blood sugar in the target range recommended by your health care provider. This  information is not intended to replace advice given to you by your health care provider. Make sure you discuss any questions you have with your health care provider. Document Revised: 04/01/2020 Document Reviewed:  04/01/2020 Elsevier Patient Education  2021 [employer].

## 2021-01-03 NOTE — Telephone Encounter (Signed)
Copied from [place] [phone]. Topic: General - Other >> Jan 03, 2021 11:02 AM Hinda Lenis D wrote: Pt ask if she need to take have or the hold table //  hydrochlorothiazide (HYDRODIURIL) 25 MG tablet [[id number]] // BP 120/74 // please advise

## 2021-01-03 NOTE — Telephone Encounter (Signed)
Her BP regimen has not changed looking at her chart, she is to take one 25mg  tablet of HCTZ daily.

## 2021-01-03 NOTE — Telephone Encounter (Signed)
Patient notified

## 2021-01-06 ENCOUNTER — Ambulatory Visit: Payer: Self-pay | Admitting: *Deleted

## 2021-01-06 ENCOUNTER — Inpatient Hospital Stay: Payer: Medicare HMO | Admitting: Family Medicine

## 2021-01-06 NOTE — Telephone Encounter (Signed)
I returned her call and answered her question  Regarding coffee.  See notes below.  I let her know it was ok to drink her coffee in the mornings as long as her doctors haven't said anything about not drinking coffee since her seizure.   "None of my doctors have told me not to drink coffee".    "They gave me coffee in the hospital".    She mentioned a lady called her regarding food vouchers.   "I talked with her last week".   I let her know the office [facility]) is closed today due to icy weather.  I let her know if she spoke to the lady last week she probably needs time to get things processed for the food vouchers.   I let her know they don't work over the weekend and due to the icy weather and the office being closed today to give her a day or two to complete working on [employer].   She was agreeable to this plan.    Reason for Disposition . Health Information question, no triage required and triager able to answer question  Answer Assessment - Initial Assessment Questions 1. REASON FOR CALL or QUESTION: "What is your reason for calling today?" or "How can I best help you?" or "What question do you have that I can help answer?"     Can I have coffee?   I've had a seizure and went to the hospital.   I just wanted to be sure.   I see the neurologist on Wed.    I can't drive for 6 months due to having a new seizure.  Protocols used: INFORMATION ONLY CALL - NO TRIAGE-A-AH

## 2021-01-08 ENCOUNTER — Other Ambulatory Visit: Payer: Self-pay | Admitting: Nurse Practitioner

## 2021-01-08 ENCOUNTER — Telehealth: Payer: Self-pay | Admitting: General Practice

## 2021-01-08 DIAGNOSIS — N1831 Chronic kidney disease, stage 3a: Secondary | ICD-10-CM | POA: Diagnosis not present

## 2021-01-08 DIAGNOSIS — Z5941 Food insecurity: Secondary | ICD-10-CM

## 2021-01-08 DIAGNOSIS — R9089 Other abnormal findings on diagnostic imaging of central nervous system: Secondary | ICD-10-CM | POA: Diagnosis not present

## 2021-01-08 DIAGNOSIS — R569 Unspecified convulsions: Secondary | ICD-10-CM | POA: Diagnosis not present

## 2021-01-08 NOTE — Telephone Encounter (Signed)
  Chronic Care Management   Note  01/08/2021 Name: ADRIANN THAU MRN: [id number] DOB: [birth year]  Patient called and left a voicemail asking for a call back.  Called the patient back. Went to [employer], unable to leave a Advertising account executive. Will continue to monitor.    Follow up plan: Telephone follow up appointment with care management team member scheduled for: Per appointment scheduled.  Noreene Larsson RN, MSN, [facility] Mobile: [phone]

## 2021-01-10 ENCOUNTER — Telehealth: Payer: Self-pay

## 2021-01-10 NOTE — Telephone Encounter (Signed)
° °  Telephone encounter was:  Successful.  01/10/2021 Name: [name] MRN: [id number] DOB: 06-26-1951  Kelly Poole is a 70 y.o. year old female who is a primary care patient of Cannady, Barbaraann Faster, NP . The community resource team was consulted for assistance with Transportation Needs , Food Insecurity and utility  Care guide performed the following interventions: Patient provided with information about care guide support team and interviewed to confirm resource needs Provided patient information for Medicaid, Dial A Ride transportation and sent request for [employer] for grocery gift card.  Follow Up Plan:  Care guide will follow up with patient by phone over the next 7 days  Aylen Rambert, AAS Paralegal, [facility]  [address], [place] [address] ??[name]   ?? [phone]   [email or web address]

## 2021-01-14 ENCOUNTER — Telehealth: Payer: Self-pay

## 2021-01-14 NOTE — Telephone Encounter (Signed)
   Telephone encounter was:  Successful.  01/14/2021 Name: LEASA KINCANNON MRN: [id number] DOB: [birth year]  Bessye JAEDEN WESTBAY is a 70 y.o. year old female who is a primary care patient of Cannady, Barbaraann Faster, NP . The community resource team was consulted for assistance with Transportation Needs , Food Insecurity and financial strain  Care guide performed the following interventions: Follow up call placed to community resources to determine status of patients referral Follow up call placed to the patient to discuss status of referral [name] at [facility] has inter office mailed [place] cards to office for pick-up.  Let patient know this. Emailed Barth Kirks. Gave information for Dial A Ride, [place].  Follow Up Plan:  No further follow up planned at this time. The patient has been provided with needed resources.  Milicent Adams, AAS Paralegal, [place] . Embedded Care Coordination [facility]  [facility]  [address], [place] [address] ??[email or web address]  ?? [phone]   [email or web address]

## 2021-01-15 ENCOUNTER — Other Ambulatory Visit: Payer: Self-pay | Admitting: Neurology

## 2021-01-15 ENCOUNTER — Telehealth: Payer: Self-pay

## 2021-01-15 ENCOUNTER — Other Ambulatory Visit: Payer: Self-pay | Admitting: Nurse Practitioner

## 2021-01-15 DIAGNOSIS — R569 Unspecified convulsions: Secondary | ICD-10-CM

## 2021-01-15 DIAGNOSIS — R9089 Other abnormal findings on diagnostic imaging of central nervous system: Secondary | ICD-10-CM

## 2021-01-15 NOTE — Telephone Encounter (Signed)
[phone] Pt wants to be contacted once office receives gift cards.

## 2021-01-15 NOTE — Telephone Encounter (Signed)
Called pt advised order has been placed pt verbalized understanding

## 2021-01-15 NOTE — Telephone Encounter (Signed)
Please advise 

## 2021-01-15 NOTE — Telephone Encounter (Signed)
Patient notified

## 2021-01-15 NOTE — Telephone Encounter (Signed)
Order placed

## 2021-01-15 NOTE — Telephone Encounter (Signed)
Copied from [place] [phone]. Topic: General - Other >> Jan 14, 2021  4:11 PM Leward Quan A wrote: Reason for CRM: Patient called in to say that her BP have been running a bit on the low side and she wanted to know if that was ok 116/82. States that there is no dizziness or any other symptoms she was just wondering. Please call  Ph# [phone]  Would pt need apt?

## 2021-01-15 NOTE — Telephone Encounter (Signed)
Copied from [place] [phone]. Topic: Referral - Request for Referral >> Jan 15, 2021 12:53 PM Erick Blinks wrote: Has patient seen PCP for this complaint? No.   *If NO, is insurance requiring patient see PCP for this issue before PCP can refer them? Referral for which specialty: Neurology  Preferred provider/office: [facility]  Reason for referral: Wants second opinion, pt had seizure she wants an MRI there.

## 2021-01-15 NOTE — Telephone Encounter (Signed)
Pleas alert her those are good numbers, we want <130/80 for stroke prevention, but do not want her getting <90/60 range.  I would ensure good hydration, water intake, at home.  If no symptoms continue to monitor, but if starts having dizziness, light headed, or any changes let me know.

## 2021-01-16 NOTE — Telephone Encounter (Signed)
FYI - Pt was advised that the pts [employer] gift cards came in and she stated she can not drive and will send her aunt [place] or brother Dexter to pick them up for her/

## 2021-01-20 ENCOUNTER — Telehealth: Payer: Self-pay

## 2021-01-20 ENCOUNTER — Telehealth: Payer: Self-pay | Admitting: Nurse Practitioner

## 2021-01-20 NOTE — Telephone Encounter (Signed)
For long term disability we do not do this in office, she would need to reach out to local government disability office about this process.  For short term she would need papers from her workplace for me to fill out and an appointment.  I am adding Jerene Pitch here to call her to go over process for LT disability.

## 2021-01-20 NOTE — Telephone Encounter (Signed)
Patient has been scheduled

## 2021-01-20 NOTE — Telephone Encounter (Signed)
Patient is calling to ask Jolene how does she get signed up for disability. Patient had a seizure on 12/24/20. Patient would like to know does Jolene need to start the process for her? Patient was in hospital for 4 days. Please advise CB- [phone]

## 2021-01-20 NOTE — Telephone Encounter (Signed)
Please advise 

## 2021-01-20 NOTE — Chronic Care Management (AMB) (Signed)
  Chronic Care Management   Note  01/20/2021 Name: Kelly Poole MRN: [id number] DOB: [birth year]  Kelly Poole is a 70 y.o. year old female who is a primary care patient of Cannady, Barbaraann Faster, NP. Kelly Poole is currently enrolled in care management services. An additional referral for LCSW was placed.   Follow up plan: Unsuccessful telephone outreach attempt made. The care management team will reach out to the patient again over the next 2 days.  If patient returns call to provider office, please advise to call [facility]  at [place], [place], [place],  [address] Direct Dial: [phone] [email or web address] Website: [email or web address]

## 2021-01-20 NOTE — Chronic Care Management (AMB) (Signed)
  Chronic Care Management   Note  01/20/2021 Name: Kelly Poole MRN: [id number] DOB: [birth year]  Kelly Poole is a 70 y.o. year old female who is a primary care patient of Cannady, Barbaraann Faster, NP. Kelly Poole is currently enrolled in care management services. An additional referral for LCSW was placed.   Follow up plan: Telephone appointment with care management team member scheduled for:01/31/2021  Noreene Larsson, [place], [place], [place] [address] Direct Dial: [phone] [email or web address]

## 2021-01-20 NOTE — Telephone Encounter (Signed)
Would pt need appointment for this?

## 2021-01-31 ENCOUNTER — Telehealth: Payer: Self-pay | Admitting: Nurse Practitioner

## 2021-01-31 ENCOUNTER — Telehealth: Payer: Medicare HMO

## 2021-01-31 ENCOUNTER — Ambulatory Visit: Payer: Self-pay | Admitting: Licensed Clinical Social Worker

## 2021-01-31 NOTE — Telephone Encounter (Signed)
PT is calling to reschedule her 11:15a appt with Eula Fried. Pt states that Jerene Pitch must have called her home phone. PT is unable to drive and is staying at her mother's the best CB is below. Please advise [phone]

## 2021-01-31 NOTE — Chronic Care Management (AMB) (Signed)
  Care Management   Follow Up Note   01/31/2021 Name: Kelly Poole MRN: [id number] DOB: January 26, 1951  Referred by: Venita Lick, NP Reason for referral : Care Coordination   Kelly Poole is a 70 y.o. year old female who is a primary care patient of Cannady, Barbaraann Faster, NP. The care management team was consulted for assistance with care management and care coordination needs.    Review of patient status, including review of consultants reports, relevant laboratory and other test results, and collaboration with appropriate care team members and the patient's provider was performed as part of comprehensive patient evaluation and provision of chronic care management services.    LCSW completed CCM outreach attempt today but was unable to reach patient successfully. A HIPPA compliant voice message was left encouraging patient to return call once available. LCSW will ask Scheduling Care Guide to reschedule CCM SW appointment with patient as well.  Eula Fried, BSW, MSW, [facility] Care Management [email or web address] Phone: [phone]

## 2021-02-03 NOTE — Progress Notes (Signed)
Patient has been rescheduled.

## 2021-02-03 NOTE — Telephone Encounter (Signed)
I am sorry I think this was routed to me by mistake, I don't schedule for [employer].    Thank you,  Colletta [place]

## 2021-02-05 ENCOUNTER — Telehealth: Payer: Self-pay

## 2021-02-05 NOTE — Telephone Encounter (Signed)
Copied from [place] [phone]. Topic: General - Other >> Feb 05, 2021  2:37 PM Lennox Solders wrote: Reason for CRM: FYI Pt is calling and had seizure and is not driving due to seizure and would like to resuming her driving again. Pt will reach out to her neurologist she has an appt tomorrow to get his advise

## 2021-02-05 NOTE — Telephone Encounter (Signed)
Called pt advised of Jolene's message she states that she will ask tomorrow at her appt she has to have an ekg

## 2021-02-05 NOTE — Telephone Encounter (Signed)
Noted, yes will need to discuss this with neurology.

## 2021-02-06 DIAGNOSIS — R569 Unspecified convulsions: Secondary | ICD-10-CM | POA: Diagnosis not present

## 2021-02-07 ENCOUNTER — Telehealth: Payer: Medicare HMO | Admitting: General Practice

## 2021-02-07 ENCOUNTER — Ambulatory Visit (INDEPENDENT_AMBULATORY_CARE_PROVIDER_SITE_OTHER): Payer: Medicare HMO | Admitting: General Practice

## 2021-02-07 DIAGNOSIS — R569 Unspecified convulsions: Secondary | ICD-10-CM

## 2021-02-07 DIAGNOSIS — I10 Essential (primary) hypertension: Secondary | ICD-10-CM

## 2021-02-07 DIAGNOSIS — N1832 Chronic kidney disease, stage 3b: Secondary | ICD-10-CM

## 2021-02-07 NOTE — Patient Instructions (Signed)
Visit Information  PATIENT GOALS: Patient Care Plan: RNCM: Chronic Kidney (Adult)    Problem Identified: RNCM: Adjustment to Chronic Kidney Disease   Priority: Medium    Long-Range Goal: RNCM: CKD3 Management   Priority: Medium  Note:   Current Barriers:  Marland Kitchen Knowledge Deficits related to resources in the community for management of CKD3 and chronic conditions  . Chronic Disease Management support and education needs related to effective management of CKD3 . Lacks caregiver support.  . Non-adherence to prescribed medication regimen . Does not adhere to prescribed medication regimen . Lacks social connections . Does not contact provider office for questions/concerns  Nurse Case Manager Clinical Goal(s):  . patient will verbalize understanding of plan for effective management of CKD3 . patient will work with [facility] and pcp  to address needs related to CKD3 . patient will attend all scheduled medical appointments: 02-24-2021 with pcp . patient will demonstrate improved health management independence as evidenced bylabs stable with no decline, adherence to dietary restrictions, adherence to medications, and working with the CCM team to optimize health and well being   Interventions:  . 1:1 collaboration with Venita Lick, NP regarding development and update of comprehensive plan of care as evidenced by provider attestation and co-signature . Inter-disciplinary care team collaboration (see longitudinal plan of care) . Evaluation of current treatment plan related to CKD3 and patient's adherence to plan as established by provider. . Advised patient to call the office for questions or concerns . Provided education to patient re: following heart healthy renal diet . Reviewed medications with patient and discussed compliance. The patient states she has her medications and is taking as prescribed . Reviewed scheduled/upcoming provider appointments including: 02-24-2021 with pcp . Discussed plans  with patient for ongoing care management follow up and provided patient with direct contact information for care management team  Patient Goals/Self-Care Activities Over the next 120 days, patient will:  - Patient will self administer medications as prescribed Patient will attend all scheduled provider appointments Patient will call pharmacy for medication refills Patient will attend church or other social activities Patient will continue to perform ADL's independently Patient will continue to perform IADL's independently Patient will call provider office for new concerns or questions Patient will work with BSW to address care coordination needs and will continue to work with the clinical team to address health care and disease management related needs.   - decision-making supported - depression screen reviewed - goal setting facilitated - positive reinforcement provided - problem-solving facilitated - relaxation techniques promoted - self-care encouraged - self-reflection promoted - verbalization of feelings encouraged  Follow Up Plan: Telephone follow up appointment with care management team member scheduled for: 03-28-2021 at 0900 am       Task: RNCM: Support Psychological Response to Chronic Kidney Disease   Note:   Care Management Activities:    - decision-making supported - depression screen reviewed - goal setting facilitated - positive reinforcement provided - problem-solving facilitated - relaxation techniques promoted - self-care encouraged - self-reflection promoted - verbalization of feelings encouraged       Problem Identified: Palliative Care     Patient Care Plan: RNCM: Hypertension (Adult)    Problem Identified: RNCM: Hypertension (Hypertension)   Priority: Medium    Long-Range Goal: RNCM: Hypertension Monitored   Priority: Medium  Note:   Objective:  . Last practice recorded BP readings:  BP Readings from Last 3 Encounters:  02/07/21 109/80   12/31/20 129/81  12/26/20 (!) 148/79 .   Marland Kitchen  Most recent eGFR/CrCl: No results found for: EGFR  No components found for: CRCL Current Barriers:  Marland Kitchen Knowledge Deficits related to basic understanding of hypertension pathophysiology and self care management . Knowledge Deficits related to understanding of medications prescribed for management of hypertension . Non-adherence to prescribed medication regimen- was without her medications for HTN in January x 1 week  . Limited Social Support . Unable to self administer medications as prescribed . Does not adhere to prescribed medication regimen . Lacks social connections . Does not contact provider office for questions/concerns Case Manager Clinical Goal(s):  Marland Kitchen Over the next 120 days, patient will verbalize understanding of plan for hypertension management . Over the next 120 days, patient will attend all scheduled medical appointments: 02-24-2021 at 0820 am . Over the next 120 days, patient will demonstrate improved adherence to prescribed treatment plan for hypertension as evidenced by taking all medications as prescribed, monitoring and recording blood pressure as directed, adhering to low sodium/DASH diet . Over the next 120 days, patient will demonstrate improved health management independence as evidenced by checking blood pressure as directed and notifying PCP if SBP>160 or DBP > 90, taking all medications as prescribe, and adhering to a low sodium diet as discussed. . Over the next 120 days, patient will verbalize basic understanding of hypertension disease process and self health management plan as evidenced by compliance with medications, dietary restrictions, and working with the CCM team to optimize health and well being  Interventions:  . Collaboration with Venita Lick, NP regarding development and update of comprehensive plan of care as evidenced by provider attestation and co-signature . Inter-disciplinary care team collaboration (see  longitudinal plan of care) . Evaluation of current treatment plan related to hypertension self management and patient's adherence to plan as established by provider. . Provided education to patient re: stroke prevention, s/s of heart attack and stroke, DASH diet, complications of uncontrolled blood pressure . Reviewed medications with patient and discussed importance of compliance . Discussed plans with patient for ongoing care management follow up and provided patient with direct contact information for care management team . Advised patient, providing education and rationale, to monitor blood pressure daily and record, calling PCP for findings outside established parameters.  . Reviewed scheduled/upcoming provider appointments including:  02-24-2021 at 0820 am Patient Goals/Self-Care Activities . Over the next 120 days, patient will:  - Self administers medications as prescribed Attends all scheduled provider appointments Calls provider office for new concerns, questions, or BP outside discussed parameters Checks BP and records as discussed Follows a low sodium diet/DASH diet - blood pressure trends reviewed - depression screen reviewed - home or ambulatory blood pressure monitoring encouraged Follow Up Plan: Telephone follow up appointment with care management team member scheduled for: 03-28-2021 at 0900 am   Task: RNCM: Identify and Monitor Blood Pressure Elevation   Note:   Care Management Activities:    - blood pressure trends reviewed - depression screen reviewed - home or ambulatory blood pressure monitoring encouraged       Patient Care Plan: RNCM: Seizures- new onset    Problem Identified: RNCM: Health Promotion or Disease Self-Management (General Plan of Care)   Priority: High    Long-Range Goal: RNCM: Seizures management   Priority: High  Note:   Current Barriers:  Marland Kitchen Knowledge Deficits related to resources available for help with needs post seizure activity  . Chronic  Disease Management support and education needs related to new onset of witnessed seizure in January 2022 .  Lacks caregiver support.  . Transportation barriers- patient cannot drive at this time, says she has adequate transportation, knows there are resources available if needed for transportation. Leodis [place] social connections . Does not contact provider office for questions/concerns  Nurse Case Manager Clinical Goal(s):  . patient will verbalize understanding of plan for effective management of seizures  . patient will work with RNCM, pcp, and specialist  to address needs related to seizures  . patient will attend all scheduled medical appointments: 02-24-2021 . patient will demonstrate improved adherence to prescribed treatment plan for seizures as evidenced bymedication compliance, no new seizure activity, and working with the CCM team to manage health and well being for chronic conditions  Interventions:  . 1:1 collaboration with Venita Lick, NP regarding development and update of comprehensive plan of care as evidenced by provider attestation and co-signature . Inter-disciplinary care team collaboration (see longitudinal plan of care) . Evaluation of current treatment plan related to seizures  and patient's adherence to plan as established by provider. . Advised patient to call the office for changes or questions . Provided education to patient re: taking medications as ordered, calling when medications are not there, adherence to prescribed diet, monitoring for new onset of seizure activity, driving restricitons . Reviewed medications with patient and discussed compliance, the patient is taking Keppra and no issues noted at this time . Reviewed scheduled/upcoming provider appointments including: 02-24-2021 at 0820 with pcp, has MRI scheduled for 03-07-2021.  Marland Kitchen Discussed plans with patient for ongoing care management follow up and provided patient with direct contact information for care  management team  Patient Goals/Self-Care Activities Over the next 120 days, patient will:  - Patient will self administer medications as prescribed Patient will attend all scheduled provider appointments Patient will call pharmacy for medication refills Patient will attend church or other social activities Patient will continue to perform ADL's independently Patient will continue to perform IADL's independently Patient will call provider office for new concerns or questions Patient will work with BSW to address care coordination needs and will continue to work with the clinical team to address health care and disease management related needs.   - barriers to meeting goals identified - change-talk evoked - choices provided - collaboration with team encouraged - decision-making supported - difficulty of making life-long changes acknowledged - health risks reviewed - problem-solving facilitated - questions answered - readiness for change evaluated - reassurance provided - resources needed to meet goals identified - self-reflection promoted - self-reliance encouraged - verbalization of feelings encouraged  Follow Up Plan: Telephone follow up appointment with care management team member scheduled for: 03-28-2021 at 0900 am       Task: RNCM: Seizure management   Note:   Care Management Activities:    - barriers to meeting goals identified - change-talk evoked - choices provided - collaboration with team encouraged - decision-making supported - difficulty of making life-long changes acknowledged - health risks reviewed - problem-solving facilitated - questions answered - readiness for change evaluated - reassurance provided - resources needed to meet goals identified - self-reflection promoted - self-reliance encouraged - verbalization of feelings encouraged     Problem Identified: Coping Skills (General Plan of Care)       Patient verbalizes understanding of  instructions provided today and agrees to view in [place].   Telephone follow up appointment with care management team member scheduled for: 03-28-2021 at 0900 am  [place], MSN, [place]  Print production planner Family Practice Mobile: [phone]   Seizure, Adult A seizure is a sudden burst of abnormal electrical and chemical activity in the brain. Seizures usually last from 30 seconds to 2 minutes.  What are the causes? Common causes of this condition include:  Fever or infection.  Problems that affect the brain. These may include: ? A brain or head injury. ? Bleeding in the brain. ? A brain tumor.  Low levels of blood sugar or salt.  Kidney problems or liver problems.  Conditions that are passed from parent to child (are inherited).  Problems with a substance, such as: ? Having a reaction to a drug or a medicine. ? Stopping the use of a substance all of a sudden (withdrawal).  A stroke.  Disorders that affect how you develop. Sometimes, the cause may not be known.  What increases the risk?  Having someone in your family who has epilepsy. In this condition, seizures happen again and again over time. They have no clear cause.  Having had a tonic-clonic seizure before. This type of seizure causes you to: ? Tighten the muscles of the whole body. ? Lose consciousness.  Having had a head injury or strokes before.  Having had a lack of oxygen at birth. What are the signs or symptoms? There are many types of seizures. The symptoms vary depending on the type of seizure you have. Symptoms during a seizure  Shaking that you cannot control (convulsions) with fast, jerky movements of muscles.  Stiffness of the body.  Breathing problems.  Feeling mixed up (confused).  Staring or not responding to sound or touch.  Head nodding.  Eyes that blink, flutter, or move fast.  Drooling, grunting, or making clicking sounds  with your mouth  Losing control of when you pee or poop. Symptoms before a seizure  Feeling afraid, nervous, or worried.  Feeling like you may vomit.  Feeling like: ? You are moving when you are not. ? Things around you are moving when they are not.  Feeling like you saw or heard something before (dj vu).  Odd tastes or smells.  Changes in how you see. You may see flashing lights or spots. Symptoms after a seizure  Feeling confused.  Feeling sleepy.  Headache.  Sore muscles. How is this treated? If your seizure stops on its own, you will not need treatment. If your seizure lasts longer than 5 minutes, you will normally need treatment. Treatment may include:  Medicines given through an IV tube.  Avoiding things, such as medicines, that are known to cause your seizures.  Medicines to prevent seizures.  A device to prevent or control seizures.  Surgery.  A diet low in carbohydrates and high in fat (ketogenic diet). Follow these instructions at home: Medicines  Take over-the-counter and prescription medicines only as told by your doctor.  Avoid foods or drinks that may keep your medicine from working, such as alcohol. Activity  Follow instructions about driving, swimming, or doing things that would be dangerous if you had another seizure. Wait until your doctor says it is safe for you to do these things.  If you live in the [place] ask your local department of motor vehicles when you can drive.  Get a lot of rest. Teaching others  Teach friends and family what to do when you have a seizure. They should: ? Help you get down to the ground. ? Protect your head and body. ? Loosen any clothing around your neck. ? Turn  you on your side. ? Know whether or not you need emergency care. ? Stay with you until you are better.  Also, tell them what not to do if you have a seizure. Tell them: ? They should not hold you down. ? They should not put anything in your  mouth.   General instructions  Avoid anything that gives you seizures.  Keep a seizure diary. Write down: ? What you remember about each seizure. ? What you think caused each seizure.  Keep all follow-up visits. Contact a doctor if:  You have another seizure or seizures. Call the doctor each time you have a seizure.  The pattern of your seizures changes.  You keep having seizures with treatment.  You have symptoms of being sick or having an infection.  You are not able to take your medicine. Get help right away if:  You have any of these problems: ? A seizure that lasts longer than 5 minutes. ? Many seizures in a row and you do not feel better between seizures. ? A seizure that makes it harder to breathe. ? A seizure and you can no longer speak or use part of your body.  You do not wake up right after a seizure.  You get hurt during a seizure.  You feel confused or have pain right after a seizure. These symptoms may be an emergency. Get help right away. Call your local emergency services (911 in the [place]  Do not wait to see if the symptoms will go away.  Do not drive yourself to the hospital. Summary  A seizure is a sudden burst of abnormal electrical and chemical activity in the brain. Seizures normally last from 30 seconds to 2 minutes.  Causes of seizures include illness, injury to the head, low levels of blood sugar or salt, and certain conditions.  Most seizures will stop on their own in less than 5 minutes. Seizures that last longer than 5 minutes are a medical emergency and need treatment right away.  Many medicines are used to treat seizures. Take over-the-counter and prescription medicines only as told by your doctor. This information is not intended to replace advice given to you by your health care provider. Make sure you discuss any questions you have with your health care provider. Document Revised: 06/14/2020 Document Reviewed: 06/14/2020 Elsevier  Patient Education  [place].

## 2021-02-07 NOTE — Chronic Care Management (AMB) (Signed)
Chronic Care Management   CCM RN Visit Note  02/07/2021 Name: Kelly Poole MRN: [id number] DOB: 24-Aug-1951  Subjective: Kelly Poole is a 70 y.o. year old female who is a primary care patient of Cannady, Barbaraann Faster, NP. The care management team was consulted for assistance with disease management and care coordination needs.    Engaged with patient by telephone for follow up visit in response to provider referral for case management and/or care coordination services.   Consent to Services:  The patient was given information about Chronic Care Management services, agreed to services, and gave verbal consent prior to initiation of services.  Please see initial visit note for detailed documentation.   Patient agreed to services and verbal consent obtained.   Assessment: Review of patient past medical history, allergies, medications, health status, including review of consultants reports, laboratory and other test data, was performed as part of comprehensive evaluation and provision of chronic care management services.   SDOH (Social Determinants of Health) assessments and interventions performed:    CCM Care Plan  Allergies  Allergen Reactions  . Macrobid [Nitrofurantoin Macrocrystal] Other (See Comments)    Sore throat and irritation to throat    Outpatient Encounter Medications as of 02/07/2021  Medication Sig Note  . Ascorbic Acid (VITAMIN C) 1000 MG tablet Take 1,000 mg by mouth daily.   Marland Kitchen aspirin 81 MG tablet Take 81 mg by mouth daily.   Marland Kitchen atorvastatin (LIPITOR) 10 MG tablet Take 1 tablet (10 mg total) by mouth daily.   . Cholecalciferol (VITAMIN D-3) 125 MCG (5000 UT) TABS Take 1 tablet by mouth daily. Takes every other day   . ferrous sulfate 325 (65 FE) MG tablet Take 1 tablet (325 mg total) by mouth daily with breakfast.   . fluticasone (FLONASE) 50 MCG/ACT nasal spray Place 2 sprays into both nostrils daily for 14 days.   . Garlic 7017 MG CAPS Take 1,000 mg by mouth  daily. 10/12/2019: Doesn't take regular  . hydrochlorothiazide (HYDRODIURIL) 25 MG tablet Take 1 tablet (25 mg total) by mouth daily.   Marland Kitchen ibuprofen (ADVIL) 200 MG tablet Take by mouth.   . levETIRAcetam (KEPPRA) 500 MG tablet Take 1 tablet (500 mg total) by mouth 2 (two) times daily. New seizure medication.   Marland Kitchen losartan (COZAAR) 100 MG tablet Take 1 tablet (100 mg total) by mouth daily.   . Multiple Vitamin (MULTIVITAMINS PO) Take by mouth daily.   Marland Kitchen omeprazole (PRILOSEC) 20 MG capsule Take 1 capsule (20 mg total) by mouth daily.   . traZODone (DESYREL) 50 MG tablet Takes occassionally   . vitamin E 100 UNIT capsule Take 100 Units by mouth daily.    No facility-administered encounter medications on file as of 02/07/2021.    Patient Active Problem List   Diagnosis Date Noted  . Acute encephalopathy 12/24/2020  . AKI (acute kidney injury) ([place]) 12/24/2020  . Secondary hyperparathyroidism ([place]) 12/23/2020  . Seizure ([place]) 12/23/2020  . Anemia 07/30/2020  . Osteoarthritis of right knee 09/07/2019  . Vasomotor symptoms due to menopause 03/24/2019  . CKD (chronic kidney disease) stage 3, GFR 30-59 ml/min ([facility]) 10/05/2016  . Vitamin B12 deficiency (dietary) anemia 12/27/2015  . Hypertension 12/27/2015  . Insomnia 06/03/2015  . Thyroid goiter 06/03/2015  . HX: breast cancer 06/03/2015  . Gastroesophageal reflux disease 06/03/2015  . Hyperlipidemia 06/03/2015    Conditions to be addressed/monitored:HTN, CKD Stage 3 and Seizures  Care Plan : RNCM: Chronic Kidney (Adult)  Updates made  by Vanita Ingles since 02/07/2021 12:00 AM    Problem: RNCM: Adjustment to Chronic Kidney Disease   Priority: Medium    Long-Range Goal: RNCM: CKD3 Management   Priority: Medium  Note:   Current Barriers:  Marland Kitchen Knowledge Deficits related to resources in the community for management of CKD3 and chronic conditions  . Chronic Disease Management support and education needs related to effective management of  CKD3 . Lacks caregiver support.  . Non-adherence to prescribed medication regimen . Does not adhere to prescribed medication regimen . Lacks social connections . Does not contact provider office for questions/concerns  Nurse Case Manager Clinical Goal(s):  . patient will verbalize understanding of plan for effective management of CKD3 . patient will work with [facility] and pcp  to address needs related to CKD3 . patient will attend all scheduled medical appointments: 02-24-2021 with pcp . patient will demonstrate improved health management independence as evidenced bylabs stable with no decline, adherence to dietary restrictions, adherence to medications, and working with the CCM team to optimize health and well being   Interventions:  . 1:1 collaboration with Venita Lick, NP regarding development and update of comprehensive plan of care as evidenced by provider attestation and co-signature . Inter-disciplinary care team collaboration (see longitudinal plan of care) . Evaluation of current treatment plan related to CKD3 and patient's adherence to plan as established by provider. . Advised patient to call the office for questions or concerns . Provided education to patient re: following heart healthy renal diet . Reviewed medications with patient and discussed compliance. The patient states she has her medications and is taking as prescribed . Reviewed scheduled/upcoming provider appointments including: 02-24-2021 with pcp . Discussed plans with patient for ongoing care management follow up and provided patient with direct contact information for care management team  Patient Goals/Self-Care Activities Over the next 120 days, patient will:  - Patient will self administer medications as prescribed Patient will attend all scheduled provider appointments Patient will call pharmacy for medication refills Patient will attend church or other social activities Patient will continue to perform ADL's  independently Patient will continue to perform IADL's independently Patient will call provider office for new concerns or questions Patient will work with BSW to address care coordination needs and will continue to work with the clinical team to address health care and disease management related needs.   - decision-making supported - depression screen reviewed - goal setting facilitated - positive reinforcement provided - problem-solving facilitated - relaxation techniques promoted - self-care encouraged - self-reflection promoted - verbalization of feelings encouraged  Follow Up Plan: Telephone follow up appointment with care management team member scheduled for: 03-28-2021 at 0900 am       Task: RNCM: Support Psychological Response to Chronic Kidney Disease   Note:   Care Management Activities:    - decision-making supported - depression screen reviewed - goal setting facilitated - positive reinforcement provided - problem-solving facilitated - relaxation techniques promoted - self-care encouraged - self-reflection promoted - verbalization of feelings encouraged       Problem: Palliative Care     Care Plan : RNCM: Hypertension (Adult)  Updates made by Vanita Ingles since 02/07/2021 12:00 AM    Problem: RNCM: Hypertension (Hypertension)   Priority: Medium    Long-Range Goal: RNCM: Hypertension Monitored   Priority: Medium  Note:   Objective:  . Last practice recorded BP readings:  BP Readings from Last 3 Encounters:  02/07/21 109/80  12/31/20 129/81  12/26/20 Marland Kitchen)  148/79 .   Marland Kitchen Most recent eGFR/CrCl: No results found for: EGFR  No components found for: CRCL Current Barriers:  Marland Kitchen Knowledge Deficits related to basic understanding of hypertension pathophysiology and self care management . Knowledge Deficits related to understanding of medications prescribed for management of hypertension . Non-adherence to prescribed medication regimen- was without her medications  for HTN in January x 1 week  . Limited Social Support . Unable to self administer medications as prescribed . Does not adhere to prescribed medication regimen . Lacks social connections . Does not contact provider office for questions/concerns Case Manager Clinical Goal(s):  Marland Kitchen Over the next 120 days, patient will verbalize understanding of plan for hypertension management . Over the next 120 days, patient will attend all scheduled medical appointments: 02-24-2021 at 0820 am . Over the next 120 days, patient will demonstrate improved adherence to prescribed treatment plan for hypertension as evidenced by taking all medications as prescribed, monitoring and recording blood pressure as directed, adhering to low sodium/DASH diet . Over the next 120 days, patient will demonstrate improved health management independence as evidenced by checking blood pressure as directed and notifying PCP if SBP>160 or DBP > 90, taking all medications as prescribe, and adhering to a low sodium diet as discussed. . Over the next 120 days, patient will verbalize basic understanding of hypertension disease process and self health management plan as evidenced by compliance with medications, dietary restrictions, and working with the CCM team to optimize health and well being  Interventions:  . Collaboration with Venita Lick, NP regarding development and update of comprehensive plan of care as evidenced by provider attestation and co-signature . Inter-disciplinary care team collaboration (see longitudinal plan of care) . Evaluation of current treatment plan related to hypertension self management and patient's adherence to plan as established by provider. . Provided education to patient re: stroke prevention, s/s of heart attack and stroke, DASH diet, complications of uncontrolled blood pressure . Reviewed medications with patient and discussed importance of compliance . Discussed plans with patient for ongoing care  management follow up and provided patient with direct contact information for care management team . Advised patient, providing education and rationale, to monitor blood pressure daily and record, calling PCP for findings outside established parameters.  . Reviewed scheduled/upcoming provider appointments including:  02-24-2021 at 0820 am Patient Goals/Self-Care Activities . Over the next 120 days, patient will:  - Self administers medications as prescribed Attends all scheduled provider appointments Calls provider office for new concerns, questions, or BP outside discussed parameters Checks BP and records as discussed Follows a low sodium diet/DASH diet - blood pressure trends reviewed - depression screen reviewed - home or ambulatory blood pressure monitoring encouraged Follow Up Plan: Telephone follow up appointment with care management team member scheduled for: 03-28-2021 at 0900 am   Task: RNCM: Identify and Monitor Blood Pressure Elevation   Note:   Care Management Activities:    - blood pressure trends reviewed - depression screen reviewed - home or ambulatory blood pressure monitoring encouraged       Care Plan : RNCM: Seizures- new onset  Updates made by Vanita Ingles since 02/07/2021 12:00 AM    Problem: RNCM: Health Promotion or Disease Self-Management (General Plan of Care)   Priority: High    Long-Range Goal: RNCM: Seizures management   Priority: High  Note:   Current Barriers:  Marland Kitchen Knowledge Deficits related to resources available for help with needs post seizure activity  . Chronic Disease  Management support and education needs related to new onset of witnessed seizure in January 2022 . Lacks caregiver support.  . Transportation barriers- patient cannot drive at this time, says she has adequate transportation, knows there are resources available if needed for transportation. [place] social connections . Does not contact provider office for questions/concerns  Nurse  Case Manager Clinical Goal(s):  . patient will verbalize understanding of plan for effective management of seizures  . patient will work with RNCM, pcp, and specialist  to address needs related to seizures  . patient will attend all scheduled medical appointments: 02-24-2021 . patient will demonstrate improved adherence to prescribed treatment plan for seizures as evidenced bymedication compliance, no new seizure activity, and working with the CCM team to manage health and well being for chronic conditions  Interventions:  . 1:1 collaboration with Venita Lick, NP regarding development and update of comprehensive plan of care as evidenced by provider attestation and co-signature . Inter-disciplinary care team collaboration (see longitudinal plan of care) . Evaluation of current treatment plan related to seizures  and patient's adherence to plan as established by provider. . Advised patient to call the office for changes or questions . Provided education to patient re: taking medications as ordered, calling when medications are not there, adherence to prescribed diet, monitoring for new onset of seizure activity, driving restricitons . Reviewed medications with patient and discussed compliance, the patient is taking Keppra and no issues noted at this time . Reviewed scheduled/upcoming provider appointments including: 02-24-2021 at 0820 with pcp, has MRI scheduled for 03-07-2021.  Marland Kitchen Discussed plans with patient for ongoing care management follow up and provided patient with direct contact information for care management team  Patient Goals/Self-Care Activities Over the next 120 days, patient will:  - Patient will self administer medications as prescribed Patient will attend all scheduled provider appointments Patient will call pharmacy for medication refills Patient will attend church or other social activities Patient will continue to perform ADL's independently Patient will continue to perform  IADL's independently Patient will call provider office for new concerns or questions Patient will work with BSW to address care coordination needs and will continue to work with the clinical team to address health care and disease management related needs.   - barriers to meeting goals identified - change-talk evoked - choices provided - collaboration with team encouraged - decision-making supported - difficulty of making life-long changes acknowledged - health risks reviewed - problem-solving facilitated - questions answered - readiness for change evaluated - reassurance provided - resources needed to meet goals identified - self-reflection promoted - self-reliance encouraged - verbalization of feelings encouraged  Follow Up Plan: Telephone follow up appointment with care management team member scheduled for: 03-28-2021 at 0900 am       Task: RNCM: Seizure management   Note:   Care Management Activities:    - barriers to meeting goals identified - change-talk evoked - choices provided - collaboration with team encouraged - decision-making supported - difficulty of making life-long changes acknowledged - health risks reviewed - problem-solving facilitated - questions answered - readiness for change evaluated - reassurance provided - resources needed to meet goals identified - self-reflection promoted - self-reliance encouraged - verbalization of feelings encouraged     Problem: Coping Skills (General Plan of Care)       Plan:Telephone follow up appointment with care management team member scheduled for:  03-28-2021 at 0900 am  [place], MSN, [place]  [employer] manager: [phone]

## 2021-02-10 ENCOUNTER — Ambulatory Visit: Payer: Medicare HMO | Admitting: Licensed Clinical Social Worker

## 2021-02-10 DIAGNOSIS — F5101 Primary insomnia: Secondary | ICD-10-CM

## 2021-02-10 DIAGNOSIS — N1832 Chronic kidney disease, stage 3b: Secondary | ICD-10-CM

## 2021-02-10 DIAGNOSIS — R569 Unspecified convulsions: Secondary | ICD-10-CM

## 2021-02-10 DIAGNOSIS — I10 Essential (primary) hypertension: Secondary | ICD-10-CM | POA: Diagnosis not present

## 2021-02-10 DIAGNOSIS — F32A Depression, unspecified: Secondary | ICD-10-CM | POA: Diagnosis not present

## 2021-02-10 NOTE — Chronic Care Management (AMB) (Signed)
Chronic Care Management    Clinical Social Work Note  02/10/2021 Name: Kelly Poole MRN: [id number] DOB: 06-21-1951  Kelly Poole is a 70 y.o. year old female who is a primary care patient of Cannady, Barbaraann Faster, NP. The CCM team was consulted to assist the patient with chronic disease management and/or care coordination needs related to: [employer] .   Engaged with patient by telephone for initial visit in response to provider referral for social work chronic care management and care coordination services.   Consent to Services:  The patient was given the following information about Chronic Care Management services today, agreed to services, and gave verbal consent: 1. CCM service includes personalized support from designated clinical staff supervised by the primary care provider, including individualized plan of care and coordination with other care providers 2. 24/7 contact phone numbers for assistance for urgent and routine care needs. 3. Service will only be billed when office clinical staff spend 20 minutes or more in a month to coordinate care. 4. Only one practitioner may furnish and bill the service in a calendar month. 5.The patient may stop CCM services at any time (effective at the end of the month) by phone call to the office staff. 6. The patient will be responsible for cost sharing (co-pay) of up to 20% of the service fee (after annual deductible is met). Patient agreed to services and consent obtained.  Patient agreed to services and consent obtained.   Assessment: Review of patient past medical history, allergies, medications, and health status, including review of relevant consultants reports was performed today as part of a comprehensive evaluation and provision of chronic care management and care coordination services.     SDOH (Social Determinants of Health) assessments and interventions performed:    Advanced Directives Status: See Care Plan for related  entries.  CCM Care Plan  Allergies  Allergen Reactions  . Macrobid [Nitrofurantoin Macrocrystal] Other (See Comments)    Sore throat and irritation to throat    Outpatient Encounter Medications as of 02/10/2021  Medication Sig Note  . Ascorbic Acid (VITAMIN C) 1000 MG tablet Take 1,000 mg by mouth daily.   Marland Kitchen aspirin 81 MG tablet Take 81 mg by mouth daily.   Marland Kitchen atorvastatin (LIPITOR) 10 MG tablet Take 1 tablet (10 mg total) by mouth daily.   . Cholecalciferol (VITAMIN D-3) 125 MCG (5000 UT) TABS Take 1 tablet by mouth daily. Takes every other day   . ferrous sulfate 325 (65 FE) MG tablet Take 1 tablet (325 mg total) by mouth daily with breakfast.   . fluticasone (FLONASE) 50 MCG/ACT nasal spray Place 2 sprays into both nostrils daily for 14 days.   . Garlic 4098 MG CAPS Take 1,000 mg by mouth daily. 10/12/2019: Doesn't take regular  . hydrochlorothiazide (HYDRODIURIL) 25 MG tablet Take 1 tablet (25 mg total) by mouth daily.   Marland Kitchen ibuprofen (ADVIL) 200 MG tablet Take by mouth.   . levETIRAcetam (KEPPRA) 500 MG tablet Take 1 tablet (500 mg total) by mouth 2 (two) times daily. New seizure medication.   Marland Kitchen losartan (COZAAR) 100 MG tablet Take 1 tablet (100 mg total) by mouth daily.   . Multiple Vitamin (MULTIVITAMINS PO) Take by mouth daily.   Marland Kitchen omeprazole (PRILOSEC) 20 MG capsule Take 1 capsule (20 mg total) by mouth daily.   . traZODone (DESYREL) 50 MG tablet Takes occassionally   . vitamin E 100 UNIT capsule Take 100 Units by mouth daily.  No facility-administered encounter medications on file as of 02/10/2021.    Patient Active Problem List   Diagnosis Date Noted  . Acute encephalopathy 12/24/2020  . AKI (acute kidney injury) ([place]) 12/24/2020  . Secondary hyperparathyroidism ([place]) 12/23/2020  . Seizure ([place]) 12/23/2020  . Anemia 07/30/2020  . Osteoarthritis of right knee 09/07/2019  . Vasomotor symptoms due to menopause 03/24/2019  . CKD (chronic kidney disease) stage 3, GFR 30-59  ml/min ([facility]) 10/05/2016  . Vitamin B12 deficiency (dietary) anemia 12/27/2015  . Hypertension 12/27/2015  . Insomnia 06/03/2015  . Thyroid goiter 06/03/2015  . HX: breast cancer 06/03/2015  . Gastroesophageal reflux disease 06/03/2015  . Hyperlipidemia 06/03/2015   Care Plan : General Social Work (Adult)  Updates made by Greg Cutter, LCSW since 02/10/2021 12:00 AM    Problem: Quality of Life (General Plan of Care)     Long-Range Goal: Quality of Life Maintained   Start Date: 02/10/2021  Priority: Medium  Note:   Evidence-based guidance:   Assess patient's thoughts about quality of life, goals and expectations, and dissatisfaction or desire to improve.   Identify issues of primary importance such as mental health, illness, exercise tolerance, pain, sexual function and intimacy, cognitive change, social isolation, finances and relationships.   Assess and monitor for signs/symptoms of psychosocial concerns, especially depression or ideations regarding harm to others or self; provide or refer for mental health services as needed.   Identify sensory issues that impact quality of life such as hearing loss, vision deficit; strategize ways to maintain or improve hearing, vision.   Promote access to services in the community to support independence such as support groups, home visiting programs, financial assistance, handicapped parking tags, durable medical equipment and emergency responder.   Promote activities to decrease social isolation such as group support or social, leisure and recreational activities, employment, use of social media; consider safety concerns about being out of home for activities.   Provide patient an opportunity to share by storytelling or a "life review" to give positive meaning to life and to assist with coping and negative experiences.   Encourage patient to tap into hope to improve sense of self.   Counsel based on prognosis and as early as possible about  end-of-life and palliative care; consider referral to palliative care provider.   Advocate for the development of palliative care plan that may include avoidance of unnecessary testing and intervention, symptom control, discontinuation of medications, hospice and organ donation.   Counsel as early as possible those with life-limiting chronic disease about palliative care; consider referral to palliative care provider.   Advocate for the development of palliative care plan.   Notes:    Timeframe:  Long-Range Goal Priority:  Medium Start Date: 02/10/21                            Expected End Date:  05/10/21                     Follow Up Date- 04/04/21   - avoid negative self-talk - develop a personal safety plan - develop a plan to deal with triggers like holidays, anniversaries - exercise at least 2 to 3 times per week - have a plan for how to handle bad days - journal feelings and what helps to feel better or worse - spend time or talk with others at least 2 to 3 times per week - spend time or talk  with others every day - watch for early signs of feeling worse - write in journal every day    Why is this important?    Keeping track of your progress will help your treatment team find the right mix of medicine and therapy for you.   Write in your journal every day.   Day-to-day changes in depression symptoms are normal. It may be more helpful to check your progress at the end of each week instead of every day.     Current barriers:    Patient is having depressive symptoms due to losing independence as she is unable to drive right now   Unable to consistently perform activities of daily living and needs additional assistance / support in order to meet this unmet need  Unable to  independently self manage needs related to chronic health conditions.   Knowledge deficits related to short term plan for care coordination needs and long term plans for chronic disease management  needs  Clinical Goals: Over the next 120 days, patient will work with SW to address concerns related to stress management, increasing self care and coping with being unable to drive at this time Interventions : . Assessed needs, level of care concerns, basic eligibility and provided education on transportation resources that her insurance provides  . Reviewed community support options ( CAP, private pay, PACE program) . Provided emotional support and reflective listening as patient is having depressive symptoms due to losing independence as she is unable to drive right now  . Patient interviewed and appropriate assessments performed . Provided patient with information about healthy self-care tools to implement into her daily life . Discussed plans with patient for ongoing care management follow up and provided patient with direct contact information for care management team . Assisted patient/caregiver with obtaining information about health plan benefits . Provided education and assistance to client regarding Advanced Directives. . Provided education to patient/caregiver regarding level of care options. . Other interventions provided: Motivational Interviewing, Solution-Focused Strategies, Mindfulness or Psychologist, educational, and Emotional/Supportive Counseling . Collaboration with PCP regarding development and update of comprehensive plan of care as evidenced by provider attestation and co-signature . Inter-disciplinary care team collaboration (see longitudinal plan of care)     Follow Up Plan: SW will follow up with patient by phone over the next quarter  Eula Fried, BSW, [email or web address], [email or web address] Phone: [phone]

## 2021-02-11 ENCOUNTER — Telehealth: Payer: Self-pay

## 2021-02-11 NOTE — Telephone Encounter (Signed)
Patient informed to call her neurologist

## 2021-02-11 NOTE — Telephone Encounter (Signed)
Copied from [place] [id number]. Topic: General - Other >> Feb 11, 2021  1:32 PM Tessa Lerner A wrote: Reason for CRM: Patient would like to be contacted regarding their EEG taken on 02/06/21 Please contact to advise

## 2021-02-13 ENCOUNTER — Telehealth: Payer: Self-pay

## 2021-02-13 NOTE — Telephone Encounter (Signed)
I informed patient the other day that she needs to get the results from her neurologist, and she told me she would reach out to them, per patient they keep telling her that she needs to get them from her pcp.

## 2021-02-13 NOTE — Telephone Encounter (Signed)
I have not seen patient for follow-up, Dr. Ky Barban [facility] her post hospital.  Technically, legally driving is not permitted in the [facility] [place] for 6 months after last seizure, which would put her in July until she can drive.  I am not sure which results she is looking for.  Can we find out.  If EEG, I do not have those results, they are not pulling up for me.  I do recommend she reach out to neurology on these.

## 2021-02-13 NOTE — Telephone Encounter (Signed)
[facility], office was already closed. Will try again

## 2021-02-13 NOTE — Telephone Encounter (Signed)
Copied from [place] [id number]. Topic: Quick Communication - See Telephone Encounter >> Feb 13, 2021 10:19 AM Loma Boston wrote: CRM for notification. See Telephone encounter for: 02/13/21.Patient informed to call her neurologist   Update .... Pls call pt, states that she did call Neurologist as suggested on the 22nd and he told her to call her PCP as she had the results.  Pt wants to start driving, already working by  permission /Neurologist  FU with PT at [phone]   SG  02/11/21 1:35 PM Levert Feinstein routed this conversation to [place]   SG  02/11/21 1:35 PM Note Copied from [place] #[id number]. Topic: General - Other >> Feb 11, 2021  1:32 PM Tessa Lerner A wrote: Reason for CRM: Patient would like to be contacted regarding their EEG taken on 02/06/21 Please contact to advise

## 2021-02-13 NOTE — Telephone Encounter (Signed)
I do not see these results on Care Everywhere, can we reach out to [facility] and inform them patient is inquiring about results which I do not see on chart -- results on EEG ordered by them.  Please ask them to reach out to patient on this.

## 2021-02-14 NOTE — Telephone Encounter (Signed)
[facility] closed on Friday afternoon, will try again on Monday

## 2021-02-17 ENCOUNTER — Ambulatory Visit: Payer: Medicare HMO | Admitting: Nurse Practitioner

## 2021-02-17 NOTE — Telephone Encounter (Signed)
Thank you Kelly Poole, I figured they would call with those results.

## 2021-02-17 NOTE — Telephone Encounter (Signed)
Called and spoke with [facility], they said that once the results are received and reviewed by the provider they will contact her with the results.

## 2021-02-19 ENCOUNTER — Telehealth: Payer: Medicare HMO

## 2021-02-20 ENCOUNTER — Telehealth: Payer: Self-pay

## 2021-02-20 NOTE — Telephone Encounter (Signed)
Copied from [place] [id number]. Topic: General - Other >> Feb 20, 2021  3:58 PM Wynetta Emery, [place] C wrote: Reason for CRM: pt called in to ask if she can take 2 iron pill (ferrous sulfate 325 (65 FE) MG tablet) twice daily to help make her feel better instead? Pt is currently being prescribed 1 a day.    [phone] --please advise pt further.

## 2021-02-20 NOTE — Telephone Encounter (Signed)
She can take one tablet twice a day, but do not recommend two tablets twice a day.  We can recheck iron level at upcoming visit and decide next steps.

## 2021-02-21 NOTE — Telephone Encounter (Signed)
Patient verbally understood provider instructions.

## 2021-02-24 ENCOUNTER — Encounter: Payer: Self-pay | Admitting: Nurse Practitioner

## 2021-02-24 ENCOUNTER — Ambulatory Visit (INDEPENDENT_AMBULATORY_CARE_PROVIDER_SITE_OTHER): Payer: Medicare HMO | Admitting: Nurse Practitioner

## 2021-02-24 ENCOUNTER — Telehealth: Payer: Self-pay

## 2021-02-24 ENCOUNTER — Other Ambulatory Visit: Payer: Self-pay

## 2021-02-24 VITALS — BP 100/68 | HR 77 | Temp 99.0°F | Wt 140.4 lb

## 2021-02-24 DIAGNOSIS — R569 Unspecified convulsions: Secondary | ICD-10-CM | POA: Diagnosis not present

## 2021-02-24 DIAGNOSIS — N2581 Secondary hyperparathyroidism of renal origin: Secondary | ICD-10-CM

## 2021-02-24 DIAGNOSIS — D508 Other iron deficiency anemias: Secondary | ICD-10-CM | POA: Diagnosis not present

## 2021-02-24 DIAGNOSIS — N1832 Chronic kidney disease, stage 3b: Secondary | ICD-10-CM

## 2021-02-24 DIAGNOSIS — E1169 Type 2 diabetes mellitus with other specified complication: Secondary | ICD-10-CM | POA: Diagnosis not present

## 2021-02-24 DIAGNOSIS — D518 Other vitamin B12 deficiency anemias: Secondary | ICD-10-CM

## 2021-02-24 DIAGNOSIS — K219 Gastro-esophageal reflux disease without esophagitis: Secondary | ICD-10-CM | POA: Diagnosis not present

## 2021-02-24 DIAGNOSIS — E1159 Type 2 diabetes mellitus with other circulatory complications: Secondary | ICD-10-CM | POA: Diagnosis not present

## 2021-02-24 DIAGNOSIS — I10 Essential (primary) hypertension: Secondary | ICD-10-CM

## 2021-02-24 DIAGNOSIS — E7800 Pure hypercholesterolemia, unspecified: Secondary | ICD-10-CM

## 2021-02-24 NOTE — Progress Notes (Signed)
BP 100/68   Pulse 77   Temp 99 F (37.2 C) (Oral)   Wt 140 lb 6.4 oz (63.7 kg)   SpO2 98%   BMI 30.38 kg/m    Subjective:    Patient ID: Kelly Poole, female    DOB: 1951/05/20, 70 y.o.   MRN: [id number]  HPI: Kelly Poole is a 70 y.o. female  Chief Complaint  Patient presents with  . Hypertension    Patient states she has having good blood pressure readings at home.   Marland Kitchen Hyperlipidemia  . Follow up 6 month     Patient states she is here 6 month follow and states she had a seizure back in January.   HYPERTENSION / HYPERLIPIDEMIA Continues on Losartan 100 MG daily & HCTZ 25 MG, ASA, and Atorvastatin for HLD.  Has recent history of seizures -- which she feels is related to her being out of blood pressure medications for 5 days -- they were on [employer] truck coming from [place].  She is currently seeing Dr. Manuella Ghazi with neurology and sees next May 16th.  Currently taking Keppra, she is frustrated that she is unable to drive. Satisfied with current treatment? yes Duration of hypertension: chronic BP monitoring frequency: daily BP range: 110/70 range at home BP medication side effects: no Duration of hyperlipidemia: chronic Cholesterol medication side effects: no Cholesterol supplements: none Medication compliance: good compliance Aspirin: yes Recent stressors: no Recurrent headaches: no Visual changes: no Palpitations: no Dyspnea: no Chest pain: no Lower extremity edema: no Dizzy/lightheaded: no   GERD Continues on Prilosec 20 MG daily. GERD control status: stable  Satisfied with current treatment? yes Heartburn frequency: none Medication side effects: no  Medication compliance: stable Previous GERD medications: OTC medications Antacid use frequency:  none Dysphagia: no Odynophagia:  no Hematemesis: no Blood in stool: no EGD: no  CHRONIC KIDNEY DISEASE January 2022 labs with CRT 1.38 and GFR 42. CKD status: stable Medications renally dose: no Previous  renal evaluation: no Pneumovax:  refused Influenza Vaccine:  refused  ANEMIA Last saw hematology last 04/04/20 and told to restart iron supplement.  Taking ferrous sulfate twice daily and stopped B12 when in hospital.     Anemia status: stable Etiology of anemia: iron deficiency  Duration of anemia treatment: lifelong Compliance with treatment: good compliance Iron supplementation side effects: no Severity of anemia: mild Fatigue: no Decreased exercise tolerance: no  Dyspnea on exertion: no Palpitations: no Bleeding: no Pica: no  Has history of breast cancer and requests form sent to [facility] to have her prothesis replaced  Relevant past medical, surgical, family and social history reviewed and updated as indicated. Interim medical history since our last visit reviewed. Allergies and medications reviewed and updated.  Review of Systems  Constitutional: Negative for activity change, appetite change, diaphoresis, fatigue and fever.  Respiratory: Negative for cough, chest tightness and shortness of breath.   Cardiovascular: Negative for chest pain, palpitations and leg swelling.  Gastrointestinal: Negative.   Neurological: Negative.   Psychiatric/Behavioral: Negative.     Per HPI unless specifically indicated above     Objective:    BP 100/68   Pulse 77   Temp 99 F (37.2 C) (Oral)   Wt 140 lb 6.4 oz (63.7 kg)   SpO2 98%   BMI 30.38 kg/m   Wt Readings from Last 3 Encounters:  02/24/21 140 lb 6.4 oz (63.7 kg)  12/31/20 139 lb 6.4 oz (63.2 kg)  12/25/20 141 lb 12.1 oz (64.3 kg)  Physical Exam Vitals and nursing note reviewed.  Constitutional:      General: She is awake. She is not in acute distress.    Appearance: She is well-developed, well-groomed and overweight. She is not ill-appearing.  HENT:     Head: Normocephalic.     Right Ear: Hearing normal.     Left Ear: Hearing normal.  Eyes:     General: Lids are normal.        Right eye: No discharge.         Left eye: No discharge.     Conjunctiva/sclera: Conjunctivae normal.     Pupils: Pupils are equal, round, and reactive to light.  Neck:     Thyroid: No thyromegaly.     Vascular: No carotid bruit.  Cardiovascular:     Rate and Rhythm: Normal rate and regular rhythm.     Heart sounds: Normal heart sounds. No murmur heard. No gallop.   Pulmonary:     Effort: Pulmonary effort is normal. No accessory muscle usage or respiratory distress.     Breath sounds: Normal breath sounds.  Abdominal:     General: Bowel sounds are normal.     Palpations: Abdomen is soft.  Musculoskeletal:     Cervical back: Normal range of motion and neck supple.     Right lower leg: No edema.     Left lower leg: No edema.  Lymphadenopathy:     Cervical: No cervical adenopathy.  Skin:    General: Skin is warm and dry.  Neurological:     Mental Status: She is alert and oriented to person, place, and time.  Psychiatric:        Attention and Perception: Attention normal.        Mood and Affect: Mood normal.        Speech: Speech normal.        Behavior: Behavior normal. Behavior is cooperative.        Thought Content: Thought content normal.    Results for orders placed or performed during the hospital encounter of 12/23/20  SARS CORONAVIRUS 2 (TAT 6-24 HRS) Nasopharyngeal Nasopharyngeal Swab   Specimen: Nasopharyngeal Swab  Result Value Ref Range   SARS Coronavirus 2 NEGATIVE NEGATIVE  Resp Panel by RT-PCR (Flu A&B, Covid) Nasopharyngeal Swab   Specimen: Nasopharyngeal Swab; Nasopharyngeal(NP) swabs in vial transport medium  Result Value Ref Range   SARS Coronavirus 2 by RT PCR NEGATIVE NEGATIVE   Influenza A by PCR NEGATIVE NEGATIVE   Influenza B by PCR NEGATIVE NEGATIVE  CSF culture   Specimen: PATH Cytology CSF; Cerebrospinal Fluid  Result Value Ref Range   Specimen Description      CSF Performed at [facility], [address] [place], [place] [address]    Special Requests       NONE Performed at [facility], [address], [place], [place] [address]    Gram Stain      FEW WBC SEEN NO ORGANISMS SEEN Performed at [facility], [address], [place], [place] [address]    Culture      NO GROWTH Performed at [facility], [place] [address], [place], [place] [address]    Report Status 12/27/2020 FINAL   CBC with Differential  Result Value Ref Range   WBC 9.8 4.0 - 10.5 K/uL   RBC 2.94 (L) 3.87 - 5.11 MIL/uL   Hemoglobin 9.0 (L) 12.0 - 15.0 g/dL   HCT 28.0 (L) 36.0 - 46.0 %  MCV 95.2 80.0 - 100.0 fL   MCH 30.6 26.0 - 34.0 pg   MCHC 32.1 30.0 - 36.0 g/dL   RDW 12.7 11.5 - 15.5 %   Platelets 269 150 - 400 K/uL   nRBC 0.0 0.0 - 0.2 %   Neutrophils Relative % 83 %   Neutro Abs 8.1 (H) 1.7 - 7.7 K/uL   Lymphocytes Relative 10 %   Lymphs Abs 1.0 0.7 - 4.0 K/uL   Monocytes Relative 6 %   Monocytes Absolute 0.6 0.1 - 1.0 K/uL   Eosinophils Relative 1 %   Eosinophils Absolute 0.1 0.0 - 0.5 K/uL   Basophils Relative 0 %   Basophils Absolute 0.0 0.0 - 0.1 K/uL   Immature Granulocytes 0 %   Abs Immature Granulocytes 0.03 0.00 - 0.07 K/uL  Comprehensive metabolic panel  Result Value Ref Range   Sodium 138 135 - 145 mmol/L   Potassium 4.0 3.5 - 5.1 mmol/L   Chloride 104 98 - 111 mmol/L   CO2 23 22 - 32 mmol/L   Glucose, Bld 113 (H) 70 - 99 mg/dL   BUN 26 (H) 8 - 23 mg/dL   Creatinine, Ser 1.76 (H) 0.44 - 1.00 mg/dL   Calcium 9.2 8.9 - 10.3 mg/dL   Total Protein 7.9 6.5 - 8.1 g/dL   Albumin 4.0 3.5 - 5.0 g/dL   AST 56 (H) 15 - 41 U/L   ALT 56 (H) 0 - 44 U/L   Alkaline Phosphatase 87 38 - 126 U/L   Total Bilirubin 0.6 0.3 - 1.2 mg/dL   GFR, Estimated 31 (L) >60 mL/min   Anion gap 11 5 - 15  Urine Drug Screen, Qualitative ([facility] only)  Result Value Ref Range   Tricyclic, Ur Screen NONE DETECTED NONE DETECTED   Amphetamines, Ur Screen NONE DETECTED NONE DETECTED   MDMA (Ecstasy)Ur Screen NONE DETECTED NONE DETECTED   Cocaine  Metabolite,Ur [place] NONE DETECTED NONE DETECTED   Opiate, Ur Screen NONE DETECTED NONE DETECTED   Phencyclidine (PCP) Ur S NONE DETECTED NONE DETECTED   Cannabinoid 50 Ng, Ur [place] NONE DETECTED NONE DETECTED   Barbiturates, Ur Screen NONE DETECTED NONE DETECTED   Benzodiazepine, Ur Scrn POSITIVE (A) NONE DETECTED   Methadone Scn, Ur NONE DETECTED NONE DETECTED  Ethanol  Result Value Ref Range   Alcohol, Ethyl (B) <10 <10 mg/dL  CK  Result Value Ref Range   Total CK 1,132 (H) 38 - 234 U/L  HIV Antibody (routine testing w rflx)  Result Value Ref Range   HIV Screen 4th Generation wRfx Non Reactive Non Reactive  Comprehensive metabolic panel  Result Value Ref Range   Sodium 136 135 - 145 mmol/L   Potassium 4.2 3.5 - 5.1 mmol/L   Chloride 103 98 - 111 mmol/L   CO2 23 22 - 32 mmol/L   Glucose, Bld 93 70 - 99 mg/dL   BUN 24 (H) 8 - 23 mg/dL   Creatinine, Ser 1.52 (H) 0.44 - 1.00 mg/dL   Calcium 9.1 8.9 - 10.3 mg/dL   Total Protein 7.1 6.5 - 8.1 g/dL   Albumin 3.6 3.5 - 5.0 g/dL   AST 42 (H) 15 - 41 U/L   ALT 42 0 - 44 U/L   Alkaline Phosphatase 78 38 - 126 U/L   Total Bilirubin 1.0 0.3 - 1.2 mg/dL   GFR, Estimated 37 (L) >60 mL/min   Anion gap 10 5 - 15  Magnesium  Result Value Ref Range  Magnesium 1.8 1.7 - 2.4 mg/dL  Magnesium  Result Value Ref Range   Magnesium 1.6 (L) 1.7 - 2.4 mg/dL  CBC with Differential/Platelet  Result Value Ref Range   WBC 10.4 4.0 - 10.5 K/uL   RBC 2.85 (L) 3.87 - 5.11 MIL/uL   Hemoglobin 8.7 (L) 12.0 - 15.0 g/dL   HCT 27.0 (L) 36.0 - 46.0 %   MCV 94.7 80.0 - 100.0 fL   MCH 30.5 26.0 - 34.0 pg   MCHC 32.2 30.0 - 36.0 g/dL   RDW 12.6 11.5 - 15.5 %   Platelets 195 150 - 400 K/uL   nRBC 0.0 0.0 - 0.2 %   Neutrophils Relative % 71 %   Neutro Abs 7.5 1.7 - 7.7 K/uL   Lymphocytes Relative 20 %   Lymphs Abs 2.1 0.7 - 4.0 K/uL   Monocytes Relative 7 %   Monocytes Absolute 0.7 0.1 - 1.0 K/uL   Eosinophils Relative 1 %   Eosinophils Absolute 0.1 0.0 -  0.5 K/uL   Basophils Relative 1 %   Basophils Absolute 0.1 0.0 - 0.1 K/uL   Immature Granulocytes 0 %   Abs Immature Granulocytes 0.02 0.00 - 0.07 K/uL  Sodium, urine, random  Result Value Ref Range   Sodium, Ur 133 mmol/L  Creatinine, urine, random  Result Value Ref Range   Creatinine, Urine 102 mg/dL  Urinalysis, Complete w Microscopic  Result Value Ref Range   Color, Urine YELLOW (A) YELLOW   APPearance HAZY (A) CLEAR   Specific Gravity, Urine 1.015 1.005 - 1.030   pH 5.0 5.0 - 8.0   Glucose, UA NEGATIVE NEGATIVE mg/dL   Hgb urine dipstick NEGATIVE NEGATIVE   Bilirubin Urine NEGATIVE NEGATIVE   Ketones, ur NEGATIVE NEGATIVE mg/dL   Protein, ur NEGATIVE NEGATIVE mg/dL   Nitrite NEGATIVE NEGATIVE   Leukocytes,Ua SMALL (A) NEGATIVE   RBC / HPF 0-5 0 - 5 RBC/hpf   WBC, UA 6-10 0 - 5 WBC/hpf   Bacteria, UA RARE (A) NONE SEEN   Squamous Epithelial / LPF 0-5 0 - 5   Mucus PRESENT    Hyaline Casts, UA PRESENT   CSF cell count with differential  Result Value Ref Range   Tube # 3    Color, CSF COLORLESS COLORLESS   Appearance, CSF CLEAR CLEAR   Supernatant NOT INDICATED    RBC Count, CSF 0 0 - 3 /cu mm   WBC, CSF 8 (H) 0 - 5 /cu mm   Segmented Neutrophils-CSF 12 %   Lymphs, CSF 78 %   Monocyte-Macrophage-Spinal Fluid 10 %   Eosinophils, CSF 0 %  CK  Result Value Ref Range   Total CK 808 (H) 38 - 234 U/L  Basic metabolic panel  Result Value Ref Range   Sodium 138 135 - 145 mmol/L   Potassium 4.0 3.5 - 5.1 mmol/L   Chloride 106 98 - 111 mmol/L   CO2 25 22 - 32 mmol/L   Glucose, Bld 99 70 - 99 mg/dL   BUN 26 (H) 8 - 23 mg/dL   Creatinine, Ser 1.49 (H) 0.44 - 1.00 mg/dL   Calcium 9.1 8.9 - 10.3 mg/dL   GFR, Estimated 38 (L) >60 mL/min   Anion gap 7 5 - 15  CBC  Result Value Ref Range   WBC 9.2 4.0 - 10.5 K/uL   RBC 2.86 (L) 3.87 - 5.11 MIL/uL   Hemoglobin 9.0 (L) 12.0 - 15.0 g/dL   HCT 26.5 (L) 36.0 -  46.0 %   MCV 92.7 80.0 - 100.0 fL   MCH 31.5 26.0 - 34.0 pg    MCHC 34.0 30.0 - 36.0 g/dL   RDW 12.5 11.5 - 15.5 %   Platelets 281 150 - 400 K/uL   nRBC 0.0 0.0 - 0.2 %  Vitamin B12  Result Value Ref Range   Vitamin B-12 3,213 (H) 180 - 914 pg/mL  Folate  Result Value Ref Range   Folate 36.0 >5.9 ng/mL  Iron and TIBC  Result Value Ref Range   Iron 41 28 - 170 ug/dL   TIBC 200 (L) 250 - 450 ug/dL   Saturation Ratios 21 10.4 - 31.8 %   UIBC 159 ug/dL  Ferritin  Result Value Ref Range   Ferritin 220 11 - 307 ng/mL  Reticulocytes  Result Value Ref Range   Retic Ct Pct 1.2 0.4 - 3.1 %   RBC. 2.79 (L) 3.87 - 5.11 MIL/uL   Retic Count, Absolute 34.3 19.0 - 186.0 K/uL   Immature Retic Fract 14.4 2.3 - 27.0 %  Basic metabolic panel  Result Value Ref Range   Sodium 139 135 - 145 mmol/L   Potassium 3.8 3.5 - 5.1 mmol/L   Chloride 103 98 - 111 mmol/L   CO2 25 22 - 32 mmol/L   Glucose, Bld 88 70 - 99 mg/dL   BUN 17 8 - 23 mg/dL   Creatinine, Ser 1.38 (H) 0.44 - 1.00 mg/dL   Calcium 9.3 8.9 - 10.3 mg/dL   GFR, Estimated 42 (L) >60 mL/min   Anion gap 11 5 - 15  CBC  Result Value Ref Range   WBC 10.7 (H) 4.0 - 10.5 K/uL   RBC 2.89 (L) 3.87 - 5.11 MIL/uL   Hemoglobin 8.9 (L) 12.0 - 15.0 g/dL   HCT 27.1 (L) 36.0 - 46.0 %   MCV 93.8 80.0 - 100.0 fL   MCH 30.8 26.0 - 34.0 pg   MCHC 32.8 30.0 - 36.0 g/dL   RDW 12.5 11.5 - 15.5 %   Platelets 271 150 - 400 K/uL   nRBC 0.0 0.0 - 0.2 %  Magnesium  Result Value Ref Range   Magnesium 1.6 (L) 1.7 - 2.4 mg/dL  Cytology - Non PAP;  Result Value Ref Range   CYTOLOGY - NON GYN      CYTOLOGY - NON PAP CASE: [id number] PATIENT: Lamanda Wiechmann Non-Gynecological Cytology Report     Specimen Submitted: A. Cerebrospinal fluid  Clinical History: Seizure like activity, abnormal MRI.    DIAGNOSIS: A. CEREBROSPINAL FLUID; LUMBAR PUNCTURE: - NO MALIGNANCY IDENTIFIED. - MILD PLEOCYTOSIS.  Comment: Review of cerebrospinal fluid shows a mild pleocytosis, with approximately 8 WBCs per  microliter, with a relative lymphocyte predominance. The patient's history of mammary carcinoma is noted. There is no evidence of metastatic carcinoma. Clinical correlation is recommended.  Slides reviewed: 1 ThinPrep, 1 cell block, 1 cytospin  GROSS DESCRIPTION: A. Labeled: Received labeled with the patient's name and date of birth (per requisition CSF) Received: Fresh Volume: 4 mL Description of fluid and container in which it is received: Received in a clear plastic container with a translucent colorless screw top lid is colorless clear fluid. Cytospin sl ide(s) received: Yes, 1  Specimen material submitted for: Cell block and ThinPrep  The cell block material is fixed in formalin for 6 hours prior to processing.   Final Diagnosis performed by Allena Napoleon, MD.   Electronically signed 12/25/2020 1:48:29PM The electronic signature indicates that the  named Attending Pathologist has evaluated the specimen Technical component performed at [facility], [address] [place], [place] [address] Lab: [phone] Dir: Rush Farmer, MD, MMM  Professional component performed at [facility], [facility], [place], [place], [place] [address] Lab: [phone] Dir: Dellia Nims. Rubinas, MD   Troponin I (High Sensitivity)  Result Value Ref Range   Troponin I (High Sensitivity) 8 <18 ng/L  Troponin I (High Sensitivity)  Result Value Ref Range   Troponin I (High Sensitivity) 18 (H) <18 ng/L      Assessment & Plan:   Problem List Items Addressed This Visit      Cardiovascular and Mediastinum   Hypertension    Chronic, ongoing with BP below goal in office and at home. Will continue Losartan 100 MG and HCTZ 25 MG, has taken this in past and tolerates well.  Recommend she monitor BP at home at least as few mornings a week and document, report to provider if consistent elevation >130/80.  DASH diet focus.  CMP and TSH today.  Return in 6 months.      Relevant Orders    Comprehensive metabolic panel   TSH     Digestive   Gastroesophageal reflux disease (Chronic)    Chronic, stable on Omeprazole.  Reports return of symptoms without medication, when has trialed GDR in past.  Continue current dose.  Mag level today.  Return in 6 months.      Relevant Orders   Magnesium     Endocrine   Secondary hyperparathyroidism (HCC)    Chronic, stable.  Continue to collaborate with nephrology as needed.  PTH on labs next visit.        Genitourinary   CKD (chronic kidney disease) stage 3, GFR 30-59 ml/min (HCC)    Ongoing, CKD 3.  Continue Losartan for kidney protection.  CMP today.  Refer to nephrology as needed for worsening kidney function.      Relevant Orders   Comprehensive metabolic panel     Other   Hyperlipidemia    Chronic, ongoing.  Continue current medication regimen and adjust as needed.  Lipid panel today.      Relevant Orders   Lipid Panel w/o Chol/HDL Ratio   Vitamin B12 deficiency (dietary) anemia    Chronic, stable.  Continue daily B12 supplement as needed only. Check B12 level today and adjust supplement as needed.      Relevant Orders   Vitamin B12   CBC with Differential/Platelet   Anemia    Chronic, ongoing.  Continue daily iron supplement.  Recheck iron and CBC today.  Return to hematology as needed.      Relevant Orders   CBC with Differential/Platelet   Iron, TIBC and Ferritin Panel   Seizure (HCC) - Primary    Chronic, stable at this time.  Continue to collaborate with neurology and continue current medication regimen as prescribed by them.          Follow up plan: Return in about 6 months (around 08/27/2021) for Annual physical.

## 2021-02-24 NOTE — Telephone Encounter (Signed)
Patient was provided with [facility] contact information. Patient verbalized understanding and has no further questions.

## 2021-02-24 NOTE — Assessment & Plan Note (Signed)
Chronic, stable on Omeprazole.  Reports return of symptoms without medication, when has trialed GDR in past.  Continue current dose.  Mag level today.  Return in 6 months.

## 2021-02-24 NOTE — Patient Instructions (Signed)

## 2021-02-24 NOTE — Assessment & Plan Note (Signed)
Chronic, stable.  Continue to collaborate with nephrology as needed.  PTH on labs next visit.

## 2021-02-24 NOTE — Assessment & Plan Note (Signed)
Chronic, stable at this time.  Continue to collaborate with neurology and continue current medication regimen as prescribed by them. 

## 2021-02-24 NOTE — Assessment & Plan Note (Signed)
Ongoing, CKD 3.  Continue Losartan for kidney protection.  CMP today.  Refer to nephrology as needed for worsening kidney function.

## 2021-02-24 NOTE — Assessment & Plan Note (Signed)
Chronic, ongoing with BP below goal in office and at home. Will continue Losartan 100 MG and HCTZ 25 MG, has taken this in past and tolerates well.  Recommend she monitor BP at home at least as few mornings a week and document, report to provider if consistent elevation >130/80.  DASH diet focus.  CMP and TSH today.  Return in 6 months.

## 2021-02-24 NOTE — Assessment & Plan Note (Signed)
Chronic, ongoing.  Continue current medication regimen and adjust as needed. Lipid panel today. 

## 2021-02-24 NOTE — Telephone Encounter (Signed)
Copied from [place] [id number]. Topic: General - Call Back - No Documentation >> Feb 24, 2021  4:18 PM Erick Blinks wrote: Reason for CRM: Requesting a call back to discuss her "EEG results that I had last week" call back request   [phone]

## 2021-02-24 NOTE — Assessment & Plan Note (Signed)
Chronic, ongoing.  Continue daily iron supplement.  Recheck iron and CBC today.  Return to hematology as needed.

## 2021-02-24 NOTE — Assessment & Plan Note (Signed)
Chronic, stable.  Continue daily B12 supplement as needed only. Check B12 level today and adjust supplement as needed.

## 2021-02-25 ENCOUNTER — Other Ambulatory Visit: Payer: Self-pay | Admitting: Nurse Practitioner

## 2021-02-25 ENCOUNTER — Telehealth: Payer: Self-pay | Admitting: Nurse Practitioner

## 2021-02-25 DIAGNOSIS — N1832 Chronic kidney disease, stage 3b: Secondary | ICD-10-CM

## 2021-02-25 LAB — CBC WITH DIFFERENTIAL/PLATELET
Basophils Absolute: 0.1 10*3/uL (ref 0.0–0.2)
Basos: 1 %
EOS (ABSOLUTE): 0.3 10*3/uL (ref 0.0–0.4)
Eos: 4 %
Hematocrit: 29.5 % — ABNORMAL LOW (ref 34.0–46.6)
Hemoglobin: 9.3 g/dL — ABNORMAL LOW (ref 11.1–15.9)
Immature Grans (Abs): 0 10*3/uL (ref 0.0–0.1)
Immature Granulocytes: 0 %
Lymphocytes Absolute: 2.1 10*3/uL (ref 0.7–3.1)
Lymphs: 34 %
MCH: 29.9 pg (ref 26.6–33.0)
MCHC: 31.5 g/dL (ref 31.5–35.7)
MCV: 95 fL (ref 79–97)
Monocytes Absolute: 0.5 10*3/uL (ref 0.1–0.9)
Monocytes: 8 %
Neutrophils Absolute: 3.3 10*3/uL (ref 1.4–7.0)
Neutrophils: 53 %
Platelets: 292 10*3/uL (ref 150–450)
RBC: 3.11 x10E6/uL — ABNORMAL LOW (ref 3.77–5.28)
RDW: 13.2 % (ref 11.7–15.4)
WBC: 6.2 10*3/uL (ref 3.4–10.8)

## 2021-02-25 LAB — COMPREHENSIVE METABOLIC PANEL
ALT: 22 IU/L (ref 0–32)
AST: 26 IU/L (ref 0–40)
Albumin/Globulin Ratio: 1.5 (ref 1.2–2.2)
Albumin: 4.6 g/dL (ref 3.8–4.8)
Alkaline Phosphatase: 88 IU/L (ref 44–121)
BUN/Creatinine Ratio: 19 (ref 12–28)
BUN: 38 mg/dL — ABNORMAL HIGH (ref 8–27)
Bilirubin Total: 0.3 mg/dL (ref 0.0–1.2)
CO2: 19 mmol/L — ABNORMAL LOW (ref 20–29)
Calcium: 9.7 mg/dL (ref 8.7–10.3)
Chloride: 103 mmol/L (ref 96–106)
Creatinine, Ser: 2.03 mg/dL — ABNORMAL HIGH (ref 0.57–1.00)
Globulin, Total: 3 g/dL (ref 1.5–4.5)
Glucose: 99 mg/dL (ref 65–99)
Potassium: 5 mmol/L (ref 3.5–5.2)
Sodium: 141 mmol/L (ref 134–144)
Total Protein: 7.6 g/dL (ref 6.0–8.5)
eGFR: 26 mL/min/{1.73_m2} — ABNORMAL LOW (ref >59)

## 2021-02-25 LAB — VITAMIN B12: Vitamin B-12: 1677 pg/mL — ABNORMAL HIGH (ref 232–1245)

## 2021-02-25 LAB — MAGNESIUM: Magnesium: 1.9 mg/dL (ref 1.6–2.3)

## 2021-02-25 LAB — IRON,TIBC AND FERRITIN PANEL
Ferritin: 324 ng/mL — ABNORMAL HIGH (ref 15–150)
Iron Saturation: 34 % (ref 15–55)
Iron: 81 ug/dL (ref 27–139)
Total Iron Binding Capacity: 237 ug/dL — ABNORMAL LOW (ref 250–450)
UIBC: 156 ug/dL (ref 118–369)

## 2021-02-25 LAB — TSH: TSH: 2.85 u[IU]/mL (ref 0.450–4.500)

## 2021-02-25 LAB — LIPID PANEL W/O CHOL/HDL RATIO
Cholesterol, Total: 202 mg/dL — ABNORMAL HIGH (ref 100–199)
HDL: 51 mg/dL (ref >39)
LDL Chol Calc (NIH): 136 mg/dL — ABNORMAL HIGH (ref 0–99)
Triglycerides: 85 mg/dL (ref 0–149)
VLDL Cholesterol Cal: 15 mg/dL (ref 5–40)

## 2021-02-25 NOTE — Telephone Encounter (Signed)
Spoke with patient and informed her that Dr.Jolene has not reviewed her results yet, but as soon as the provider reviews the results and notify the clinical team what to provide informational wise then she would receive a call then. Patient verbalized understanding and has no further questions.

## 2021-02-25 NOTE — Telephone Encounter (Signed)
Patient would like the nurse to call regarding her recent lab results.  Patient stated she does not understand the wording on her results and would like it explained to her.  Please advise and call patient to discuss at [phone]

## 2021-02-25 NOTE — Telephone Encounter (Signed)
Thank you, I alerted her via MyChart that I will contact her this afternoon via MyChart with result note:)

## 2021-02-25 NOTE — Progress Notes (Signed)
Contacted via MyChart Estimated Creatinine Clearance: 20.6 mL/min (A) (by C-G formula based on SCr of 2.03 mg/dL (H)).   Good evening Kelly Poole, sorry I am just getting to you on the labs.  Busy day.  I have had a chance to sit and review now and ponder: - Creatinine is showing some elevation from previous labs and GFR some decrease -- I believe you saw kidney doctor last in September, do you have upcoming appointment with them?  If not I recommend you see them soon.  I would also like to stop your Hydrochlorothiazide for now, your blood pressures have been stable and if you start to have elevations in blood pressure there are different medications we can use that may be better on kidneys.  Continue your Losartan as it offers some kidney protection.  Ensure good water intake daily -- I would like to have you come into office next week for recheck on these labs, please schedule a lab visit. - Cholesterol levels show LDL continuing above goal, are you tolerating Atorvastatin?  If so I recommend we increase to 20 MG and I can send this in if you are okay with this. - Thyroid normal - B12 level stable, I would recommend staying off B12 at this time and if it goes low again we can restart. - Iron level is normal, I would only take the iron pill you have once daily due to this and continue to schedule to see hematology about your ongoing anemia, which could be coming from your kidney disease. Any questions? Keep being awesome!!  Thank you for allowing me to participate in your care. Kindest regards, Osman Calzadilla

## 2021-02-26 ENCOUNTER — Telehealth: Payer: Self-pay

## 2021-02-26 ENCOUNTER — Other Ambulatory Visit: Payer: Self-pay | Admitting: Nurse Practitioner

## 2021-02-26 MED ORDER — ATORVASTATIN CALCIUM 20 MG PO TABS: 20.0000 mg | ORAL_TABLET | Freq: Every day | ORAL | 4 refills | Status: DC

## 2021-02-26 NOTE — Telephone Encounter (Signed)
Copied from [place] [id number]. Topic: General - Other >> Feb 26, 2021  8:34 AM Tessa Lerner A wrote: Reason for CRM: Patient has mader contact regarding their medication Patient would like to know if they can take two atorvastatin (LIPITOR) 10 MG tablets until their new prescription for 20mg  is filled Please contact to advise

## 2021-02-26 NOTE — Telephone Encounter (Signed)
Pt verbalized understanding and confirmed understanding.

## 2021-02-26 NOTE — Telephone Encounter (Signed)
Yes she can and alert her I will be writing back to her later today:)

## 2021-03-04 ENCOUNTER — Other Ambulatory Visit: Payer: Self-pay | Admitting: Surgical

## 2021-03-04 ENCOUNTER — Telehealth: Payer: Self-pay | Admitting: Pharmacist

## 2021-03-04 NOTE — Chronic Care Management (AMB) (Signed)
Chronic Care Management Pharmacy Assistant   Name: Kelly Poole  MRN: [id number] DOB: 05-28-1951  Reason for Encounter: Disease State/Hypertension Adherence Call  Recent office visits:  02/24/2021 [facility] PCP Marnee Guarneri. NP;  Discontinue HCTZ due to decreased GFR, stop vitamin B12, only take iron supplement once a day  Recent consult visits:  01/08/2021 [facility] Neurology; continue Levetiracetam 500 mg two times a day, no medication changes indicated.  Hospital visits:  NO hospital visits since her last pharmacist visit with Birdena Crandall, [facility].  Medications: Outpatient Encounter Medications as of 03/04/2021  Medication Sig Note   Ascorbic Acid (VITAMIN C) 1000 MG tablet Take 1,000 mg by mouth daily.    aspirin 81 MG tablet Take 81 mg by mouth daily.    atorvastatin (LIPITOR) 20 MG tablet Take 1 tablet (20 mg total) by mouth daily.    Cholecalciferol (VITAMIN D-3) 125 MCG (5000 UT) TABS Take 1 tablet by mouth daily. Takes every other day    ferrous sulfate 325 (65 FE) MG tablet Take 1 tablet (325 mg total) by mouth daily with breakfast.    fluticasone (FLONASE) 50 MCG/ACT nasal spray Place 2 sprays into both nostrils daily for 14 days.    Garlic 4097 MG CAPS Take 1,000 mg by mouth daily. (Patient not taking: Reported on 02/24/2021) 10/12/2019: Doesn't take regular   hydrochlorothiazide (HYDRODIURIL) 25 MG tablet Take 1 tablet (25 mg total) by mouth daily.    levETIRAcetam (KEPPRA) 500 MG tablet Take 1 tablet (500 mg total) by mouth 2 (two) times daily. New seizure medication.    losartan (COZAAR) 100 MG tablet Take 1 tablet (100 mg total) by mouth daily.    Multiple Vitamin (MULTIVITAMINS PO) Take by mouth daily.    omeprazole (PRILOSEC) 20 MG capsule Take 1 capsule (20 mg total) by mouth daily.    vitamin E 100 UNIT capsule Take 100 Units by mouth daily.    No facility-administered encounter medications on file as of 03/04/2021.   Reviewed chart prior to disease state  call. Spoke with patient regarding BP  Recent Office Vitals: BP Readings from Last 3 Encounters:  02/24/21 100/68  02/07/21 109/80  12/31/20 129/81   Pulse Readings from Last 3 Encounters:  02/24/21 77  12/31/20 80  12/26/20 96    Wt Readings from Last 3 Encounters:  02/24/21 140 lb 6.4 oz (63.7 kg)  12/31/20 139 lb 6.4 oz (63.2 kg)  12/25/20 141 lb 12.1 oz (64.3 kg)     Kidney Function Lab Results  Component Value Date/Time   CREATININE 2.03 (H) 02/24/2021 08:59 AM   CREATININE 1.38 (H) 12/26/2020 04:25 AM   GFRNONAA 42 (L) 12/26/2020 04:25 AM   GFRAA 45 (L) 07/30/2020 03:40 PM    BMP Latest Ref Rng & Units 02/24/2021 12/26/2020 12/25/2020  Glucose 65 - 99 mg/dL 99 88 99  BUN 8 - 27 mg/dL 38(H) 17 26(H)  Creatinine 0.57 - 1.00 mg/dL 2.03(H) 1.38(H) 1.49(H)  BUN/Creat Ratio 12 - 28 19 - -  Sodium 134 - 144 mmol/L 141 139 138  Potassium 3.5 - 5.2 mmol/L 5.0 3.8 4.0  Chloride 96 - 106 mmol/L 103 103 106  CO2 20 - 29 mmol/L 19(L) 25 25  Calcium 8.7 - 10.3 mg/dL 9.7 9.3 9.1     Current antihypertensive regimen:  o Losartan 100 mg tablet once daily.   How often are you checking your Blood Pressure? daily    Current home BP readings: 109-115/73-78   What recent  interventions/DTPs have been made by any provider to improve Blood Pressure control since last CPP Visit: 02/24/2021 [facility] PCP Jolene Cannady. NP;  Discontinue HCTZ due to decreased GFR.   Any recent hospitalizations or ED visits since last visit with CPP? No    What diet changes have been made to improve Blood Pressure Control?  o Patient states she does not eat salt. She states she doesn't follow any specific diet but she tries to eat healthy overall. Patient states she eats a lot of vegetables and fruits.    What exercise is being done to improve your Blood Pressure Control?  o Patient states she was recently released to start exercising again by her neurologist. Patient states she is going to start going back  to the gym this weekend. Patient states she plans to go 2-3 days out of the week.  Adherence Review: Is the patient currently on ACE/ARB medication? Yes Does the patient have >5 day gap between last estimated fill dates? No  Future Appointments  Date Time Provider [place]  03/10/2021 10:00 AM [facility]  03/17/2021  8:20 AM [facility] LAB [facility]  03/24/2021  8:30 AM Penumalli, Earlean Polka, MD [facility] None  03/28/2021  9:00 AM [facility] CCM CASE MANAGER [facility]  03/31/2021  2:30 PM [facility] C[facility] PHARMACY [facility]  04/04/2021  9:00 AM [facility] CCM SOCIAL WORK [facility]  09/08/2021  8:00 AM Marnee Guarneri T, NP [facility]  10/15/2021  3:15 PM [facility] NURSE HEALTH ADVISOR [facility]     Star Rating Drugs: Atorvastatin last filled 01/19/2021 90 DS Lisinopril 10 mg last filled 01/25/2020 90 DS Losartan 100 mg last filled 12/16/2020 90 DS  April D Calhoun, [place] Pharmacist Assistant [phone]

## 2021-03-10 ENCOUNTER — Other Ambulatory Visit: Payer: Medicare HMO

## 2021-03-10 ENCOUNTER — Other Ambulatory Visit: Payer: Self-pay

## 2021-03-10 ENCOUNTER — Ambulatory Visit
Admission: RE | Admit: 2021-03-10 | Discharge: 2021-03-10 | Disposition: A | Discharge status: Home / Self Care | Payer: Medicare HMO | Source: Ambulatory Visit | Attending: Neurology | Admitting: Neurology

## 2021-03-10 DIAGNOSIS — G9389 Other specified disorders of brain: Secondary | ICD-10-CM | POA: Diagnosis not present

## 2021-03-10 DIAGNOSIS — G935 Compression of brain: Secondary | ICD-10-CM | POA: Diagnosis not present

## 2021-03-10 DIAGNOSIS — R569 Unspecified convulsions: Secondary | ICD-10-CM | POA: Diagnosis not present

## 2021-03-10 DIAGNOSIS — R9089 Other abnormal findings on diagnostic imaging of central nervous system: Secondary | ICD-10-CM | POA: Diagnosis not present

## 2021-03-10 DIAGNOSIS — I6782 Cerebral ischemia: Secondary | ICD-10-CM | POA: Diagnosis not present

## 2021-03-10 IMAGING — MR MR HEAD WO/W CM
11 of 16 series · 34 of 48 positions shown · IV contrast (gadavist)
Comparison: Brain MRI [DATE].

CLINICAL DATA: Abnormal finding on MRI of brain.  Seizure.

EXAM:
MRI HEAD WITHOUT AND WITH CONTRAST
TECHNIQUE: Multiplanar, multiecho pulse sequences of the brain and surrounding
structures were obtained without and with intravenous contrast.
CONTRAST:  6mL GADAVIST GADOBUTROL 1 MMOL/ML IV SOLN

[Series 5: ax dwi_tracew · axial · 3.0mm · 0.65mm/px · z∈[-153,-13]mm · 3 of 48 slices shown]
[im 1/48]
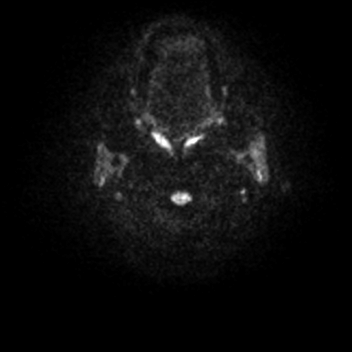
[im 24/48]
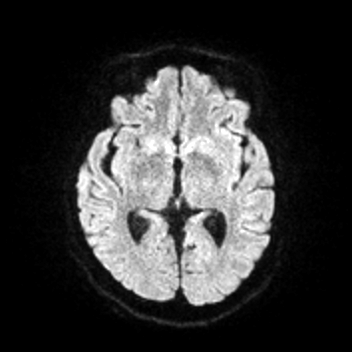
[im 48/48]
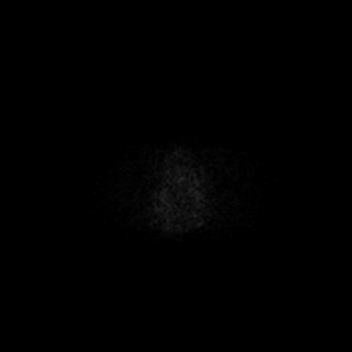

[Series 9: T1 · sagittal · 5.0mm · 0.62mm/px · 1 of 25 slices shown (1 of 2)]
[im 1/25]
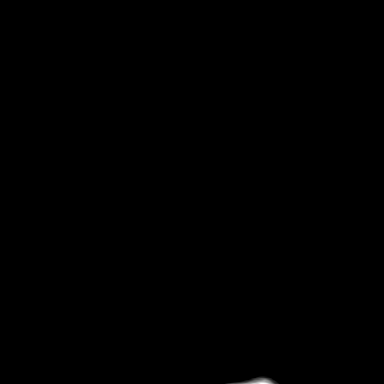

[Series 10: T2 · axial · 5.0mm · 0.53mm/px · 1 of 25 slices shown (1 of 2)]
[im 1/25]
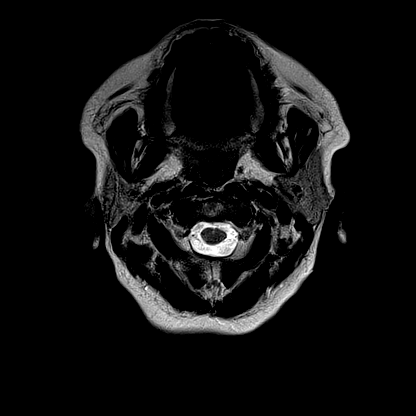

[Series 15: FLAIR · axial · 3.0mm · 0.53mm/px · z∈[-154,-8]mm · 3 of 55 slices shown (1 of 2)]
[im 1/55]
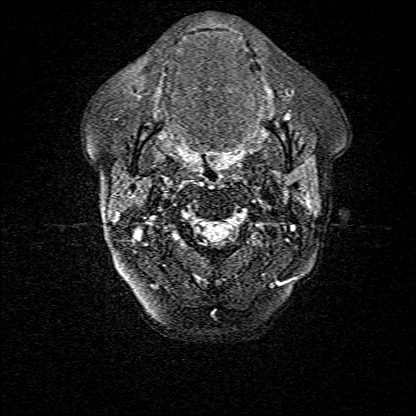
[im 28/55]
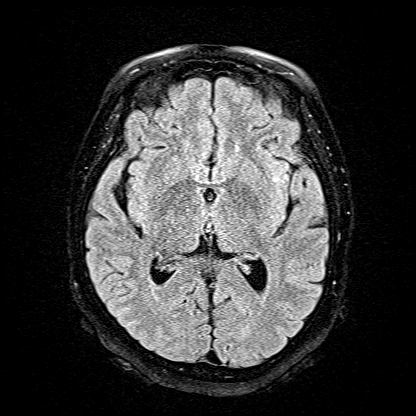
[im 55/55]
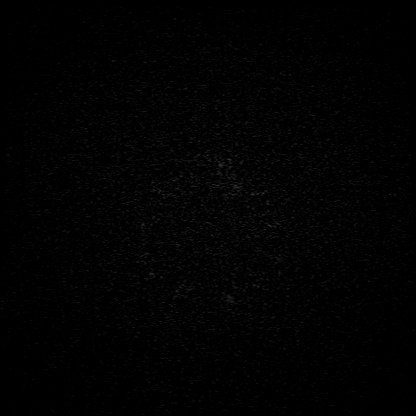

[Series 16: T1 · axial · 1.0mm · 0.98mm/px · z∈[-166,-9]mm · 8 of 176 slices shown (2 of 2)]
[im 1/176]
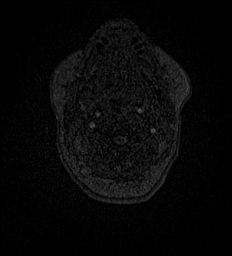
[im 22/176]
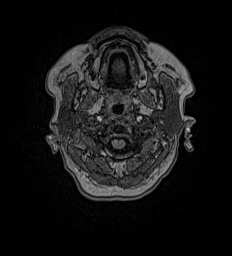
[im 44/176]
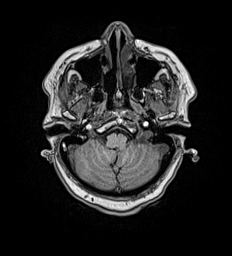
[im 66/176]
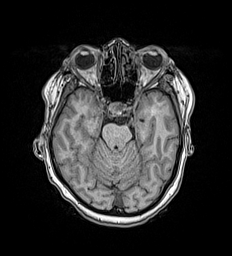
[im 110/176]
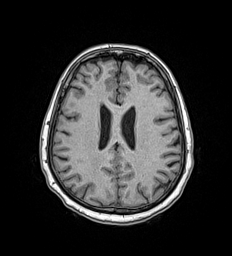
[im 132/176]
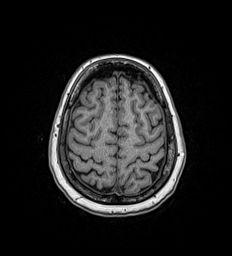
[im 154/176]
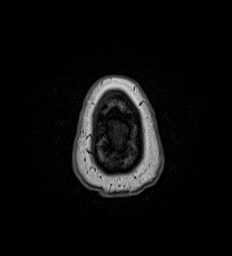
[im 176/176]
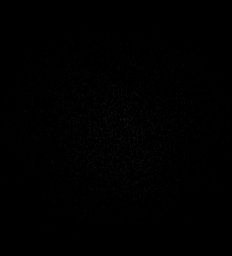

[Series 17: T2 · oblique · 3.0mm · 0.23mm/px · 2 of 35 slices shown (2 of 2)]
[im 1/35]
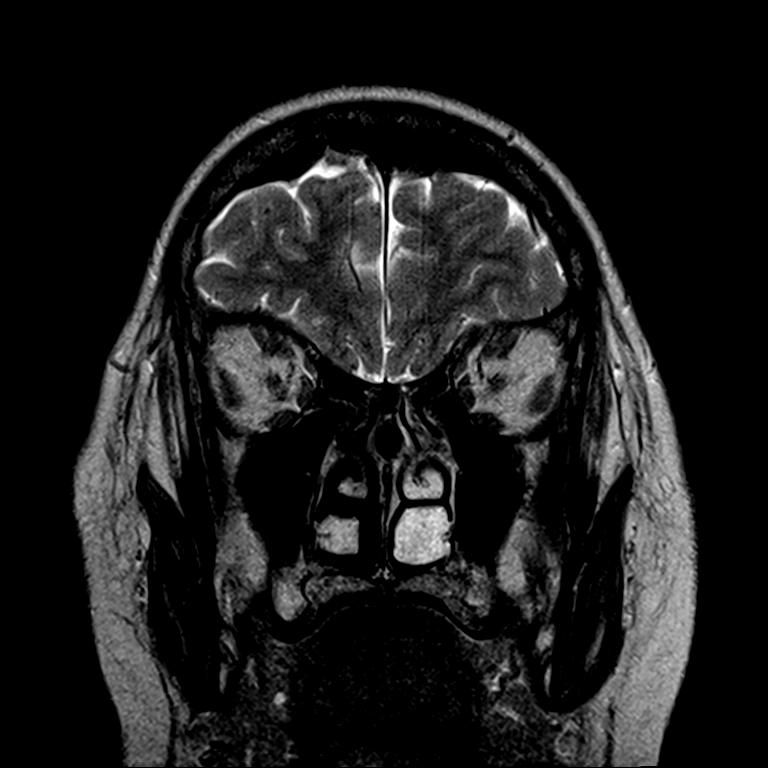
[im 35/35]
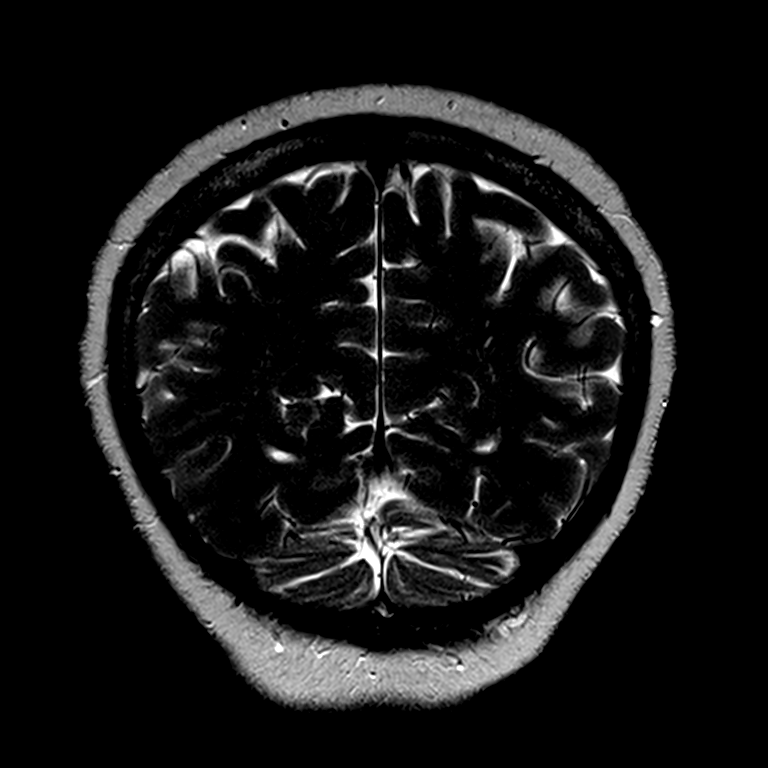

[Series 18: FLAIR · oblique · 3.0mm · 0.35mm/px · 2 of 35 slices shown (2 of 2)]
[im 1/35]
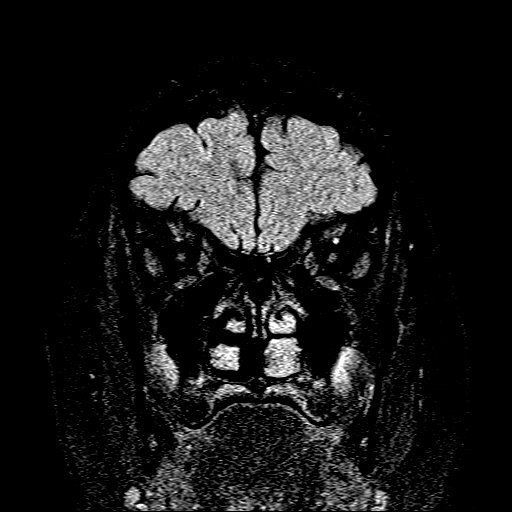
[im 35/35]
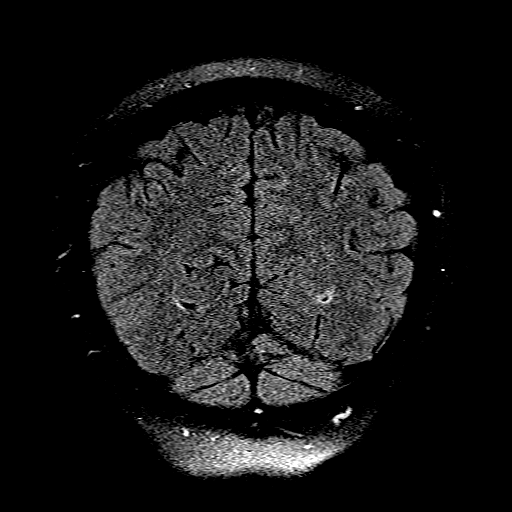

[Series 19: T2 post-contrast · coronal · 5.0mm · 0.57mm/px · 2 of 29 slices shown]
[im 1/29]
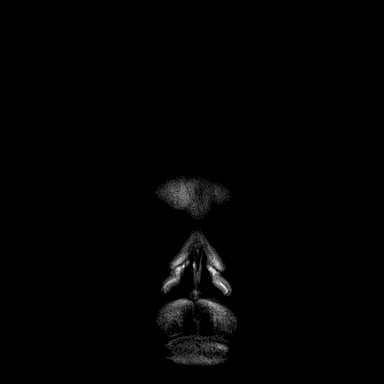
[im 29/29]
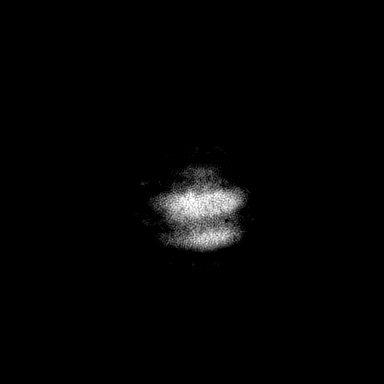

[Series 20: T1 post-contrast · axial · 1.0mm · 0.98mm/px · z∈[-166,-9]mm · 9 of 176 slices shown (1 of 3)]
[im 1/176]
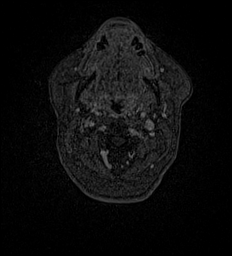
[im 22/176]
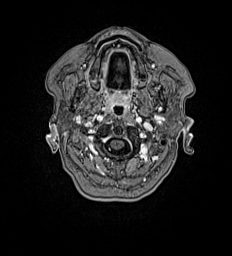
[im 44/176]
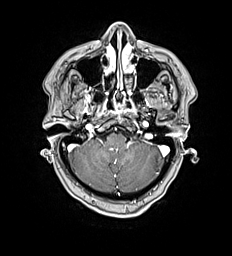
[im 66/176]
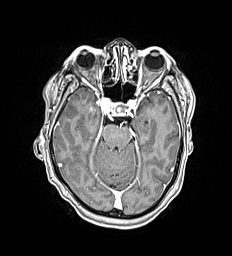
[im 88/176]
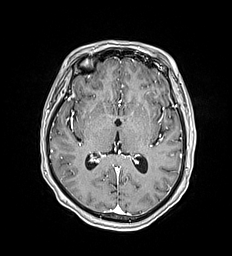
[im 110/176]
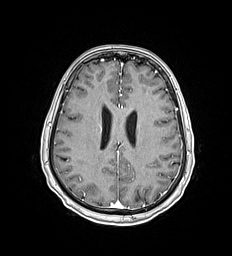
[im 132/176]
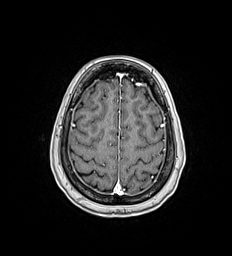
[im 154/176]
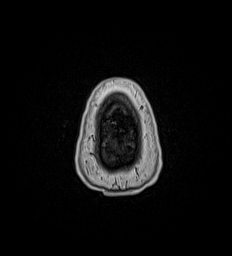
[im 176/176]
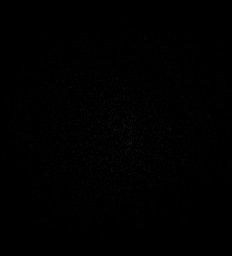

[Series 21: T1 post-contrast · coronal · 5.0mm · 0.57mm/px · 2 of 29 slices shown (2 of 3)]
[im 1/29]
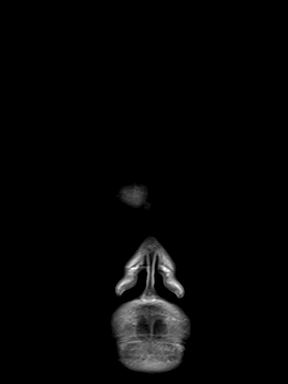
[im 29/29]
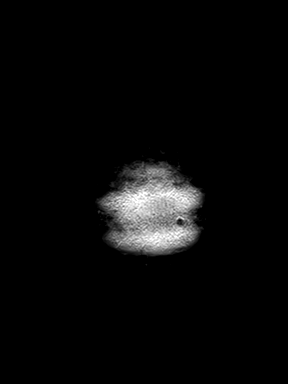

[Series 22: T1 post-contrast · sagittal · 5.0mm · 0.62mm/px · 1 of 25 slices shown (3 of 3)]
[im 1/25]
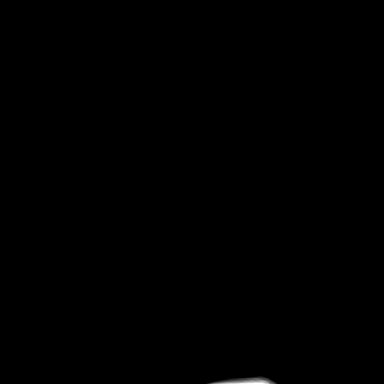

[34 of 48 positions shown; findings below may reference images not displayed]

FINDINGS: Brain:

Cerebral volume is normal for age.

Only subtle residual cortical/subcortical T2/FLAIR hyperintensity
persists within the anterior right frontal lobe. There is no longer
corresponding abnormal enhancement and this may reflect
encephalomalacia from the prior insult. Background minimal
multifocal T2/FLAIR hyperintensity within the cerebral white matter
is nonspecific, but compatible with chronic small vessel ischemic
disease.

There are fairly numerous small foci of SWI signal loss within the
supratentorial brain, most notably within the high frontoparietal
lobes and anterior right frontal lobe. Findings may reflect chronic
microhemorrhages or calcifications.

The hippocampi are symmetric in size and signal.

There is no acute infarct.

No evidence of intracranial mass.

No extra-axial fluid collection.

No midline shift.

No abnormal intracranial enhancement.

Unchanged bilateral Chiari 1 malformation.

Vascular: Expected proximal arterial flow voids.

Skull and upper cervical spine: No focal marrow lesion.

Sinuses/Orbits: Visualized orbits show no acute finding. No
significant paranasal sinus disease at the imaged levels.

Other: Trace fluid within the left mastoid air cells.
IMPRESSION: Minimal residual cortical/subcortical T2/FLAIR hyperintensity within
the anterior right frontal lobe. No corresponding abnormal
enhancement persists at this site, and this may now reflect
encephalomalacia from prior insult.

Fairly numerous punctate foci of SWI signal loss within the
supratentorial brain, particularly within the paramedian
frontoparietal lobes and anterior right frontal lobe, and
significantly progressed from the brain MRI of [DATE]. These
foci may reflect chronic microhemorrhages or calcifications.
Consider a non-contrast head CT for further characterization.

Stable and minimal background chronic small vessel ischemic disease
within the cerebral white matter.

Redemonstrated Chiari 1 malformation.

## 2021-03-10 MED ORDER — GADOBUTROL 1 MMOL/ML IV SOLN
6.0000 mL | Freq: Once | INTRAVENOUS | Status: AC | PRN
  Administered 2021-03-10: 6 mL via INTRAVENOUS

## 2021-03-13 DIAGNOSIS — R569 Unspecified convulsions: Secondary | ICD-10-CM | POA: Diagnosis not present

## 2021-03-17 ENCOUNTER — Other Ambulatory Visit: Payer: Self-pay

## 2021-03-17 ENCOUNTER — Other Ambulatory Visit: Payer: Medicare HMO

## 2021-03-17 DIAGNOSIS — N1832 Chronic kidney disease, stage 3b: Secondary | ICD-10-CM | POA: Diagnosis not present

## 2021-03-18 LAB — BASIC METABOLIC PANEL
BUN/Creatinine Ratio: 13 (ref 12–28)
BUN: 17 mg/dL (ref 8–27)
CO2: 21 mmol/L (ref 20–29)
Calcium: 9.4 mg/dL (ref 8.7–10.3)
Chloride: 105 mmol/L (ref 96–106)
Creatinine, Ser: 1.31 mg/dL — ABNORMAL HIGH (ref 0.57–1.00)
Glucose: 91 mg/dL (ref 65–99)
Potassium: 4.1 mmol/L (ref 3.5–5.2)
Sodium: 141 mmol/L (ref 134–144)
eGFR: 45 mL/min/{1.73_m2} — ABNORMAL LOW (ref >59)

## 2021-03-18 LAB — MICROALBUMIN, URINE WAIVED
Creatinine, Urine Waived: 300 mg/dL (ref 10–300)
Microalb, Ur Waived: 80 mg/L — ABNORMAL HIGH (ref 0–19)

## 2021-03-18 LAB — PTH, INTACT AND CALCIUM: PTH: 81 pg/mL — ABNORMAL HIGH (ref 15–65)

## 2021-03-18 NOTE — Progress Notes (Signed)
Contacted via MyChart   Urine continues to show protein, 80, continue Losartan which is kidney protective for this and continue visits with your kidney doctor.  Any questions? Keep being amazing!!  Thank you for allowing me to participate in your care. Kindest regards, Arrionna Serena

## 2021-03-18 NOTE — Progress Notes (Signed)
Contacted via [place] morning [place], your labs have returned.  Kidney function improved this check, creatinine and eGFR.  PTH mildly elevated, showing some hyperparathyroid, which we sometimes see with kidney disease.  When do you next see Dr. Holley Raring, your kidney doctor?  Any questions? Keep being awesome!!  Thank you for allowing me to participate in your care. Kindest regards, Luisangel Wainright

## 2021-03-24 ENCOUNTER — Ambulatory Visit: Payer: Medicare HMO | Admitting: Diagnostic Neuroimaging

## 2021-03-25 ENCOUNTER — Telehealth: Payer: Self-pay | Admitting: *Deleted

## 2021-03-25 NOTE — Chronic Care Management (AMB) (Signed)
  Care Management   Note  03/25/2021 Name: Kelly Poole MRN: [id number] DOB: [birth year]  Kelly Poole is a 70 y.o. year old female who is a primary care patient of Venita Lick, NP and is actively engaged with the care management team. I reached out to Joslyn Devon by phone today to assist with re-scheduling a follow up visit with the Licensed Clinical Social Worker  Follow up plan: Unsuccessful telephone outreach attempt made. A HIPAA compliant phone message was left for the patient providing contact information and requesting a return call.  The care management team will reach out to the patient again over the next 7 days.  If patient returns call to provider office, please advise to call [place] Gladys Damme at [place] [facility]

## 2021-03-25 NOTE — Chronic Care Management (AMB) (Signed)
  Care Management   Note  03/25/2021 Name: Kelly Poole MRN: [id number] DOB: 07/11/1951  Kelly Poole is a 70 y.o. year old female who is a primary care patient of Venita Lick, NP and is actively engaged with the care management team. I reached out to Joslyn Devon by phone today to assist with re-scheduling a follow up visit with the Licensed Clinical Social Worker  Follow up plan: Telephone appointment with care management team member scheduled for: 04/25/2021  [place] [facility]

## 2021-03-26 ENCOUNTER — Ambulatory Visit: Payer: Medicare HMO

## 2021-03-28 ENCOUNTER — Ambulatory Visit (INDEPENDENT_AMBULATORY_CARE_PROVIDER_SITE_OTHER): Payer: Medicare HMO | Admitting: General Practice

## 2021-03-28 ENCOUNTER — Telehealth: Payer: Medicare HMO | Admitting: General Practice

## 2021-03-28 DIAGNOSIS — I10 Essential (primary) hypertension: Secondary | ICD-10-CM | POA: Diagnosis not present

## 2021-03-28 DIAGNOSIS — N183 Chronic kidney disease, stage 3 unspecified: Secondary | ICD-10-CM

## 2021-03-28 DIAGNOSIS — R569 Unspecified convulsions: Secondary | ICD-10-CM

## 2021-03-28 DIAGNOSIS — N1832 Chronic kidney disease, stage 3b: Secondary | ICD-10-CM

## 2021-03-28 NOTE — Patient Instructions (Signed)
Visit Information  PATIENT GOALS: Patient Care Plan: RNCM: Chronic Kidney (Adult)    Problem Identified: RNCM: Adjustment to Chronic Kidney Disease   Priority: Medium    Long-Range Goal: RNCM: CKD3 Management   Priority: Medium  Note:   Current Barriers:  Marland Kitchen Knowledge Deficits related to resources in the community for management of CKD3 and chronic conditions  . Chronic Disease Management support and education needs related to effective management of CKD3 . Lacks caregiver support.  . Non-adherence to prescribed medication regimen . Does not adhere to prescribed medication regimen . Lacks social connections . Does not contact provider office for questions/concerns  Nurse Case Manager Clinical Goal(s):  . patient will verbalize understanding of plan for effective management of CKD3 . patient will work with [facility] and pcp  to address needs related to CKD3 . patient will attend all scheduled medical appointments: 09-08-2021 with pcp . patient will demonstrate improved health management independence as evidenced bylabs stable with no decline, adherence to dietary restrictions, adherence to medications, and working with the CCM team to optimize health and well being   Interventions:  . 1:1 collaboration with Venita Lick, NP regarding development and update of comprehensive plan of care as evidenced by provider attestation and co-signature . Inter-disciplinary care team collaboration (see longitudinal plan of care) . Evaluation of current treatment plan related to CKD3 and patient's adherence to plan as established by provider. 03-28-2021: The patient saw the pcp on 3-7 and had blood work. Hydrochlorothiazide discontinued after lab work. The patient continues to see kidney specialist on a regular basis. The patient is following a renal diet and monitoring her food content. Will continue to monitor. . Advised patient to call the office for questions or concerns . Provided education to patient  re: following heart healthy renal diet. 03-28-2021: Is eating well. Denies any issues with dietary restrictions  . Reviewed medications with patient and discussed compliance. The patient states she has her medications and is taking as prescribed . Reviewed scheduled/upcoming provider appointments including: 09-08-2021 with pcp . Discussed plans with patient for ongoing care management follow up and provided patient with direct contact information for care management team  Patient Goals/Self-Care Activities Over the next 120 days, patient will:  - Patient will self administer medications as prescribed Patient will attend all scheduled provider appointments Patient will call pharmacy for medication refills Patient will attend church or other social activities Patient will continue to perform ADL's independently Patient will continue to perform IADL's independently Patient will call provider office for new concerns or questions Patient will work with BSW to address care coordination needs and will continue to work with the clinical team to address health care and disease management related needs.   - decision-making supported - depression screen reviewed - goal setting facilitated - positive reinforcement provided - problem-solving facilitated - relaxation techniques promoted - self-care encouraged - self-reflection promoted - verbalization of feelings encouraged  Follow Up Plan: Telephone follow up appointment with care management team member scheduled for: 06-18-2021 at 0900 am       Task: RNCM: Support Psychological Response to Chronic Kidney Disease   Note:   Care Management Activities:    - decision-making supported - depression screen reviewed - goal setting facilitated - positive reinforcement provided - problem-solving facilitated - relaxation techniques promoted - self-care encouraged - self-reflection promoted - verbalization of feelings encouraged       Problem  Identified: Palliative Care     Patient Care Plan: RNCM: Hypertension (Adult)  Problem Identified: RNCM: Hypertension (Hypertension)   Priority: Medium    Long-Range Goal: RNCM: Hypertension Monitored   Priority: Medium  Note:   Objective:  . Last practice recorded BP readings:  . BP Readings from Last 3 Encounters: .  02/24/21 . 100/68 .  02/07/21 . 109/80 .  12/31/20 . 129/81 .    Marland Kitchen Most recent eGFR/CrCl: No results found for: EGFR  No components found for: CRCL Current Barriers:  Marland Kitchen Knowledge Deficits related to basic understanding of hypertension pathophysiology and self care management . Knowledge Deficits related to understanding of medications prescribed for management of hypertension . Non-adherence to prescribed medication regimen- was without her medications for HTN in January x 1 week  . Limited Social Support . Unable to self administer medications as prescribed . Does not adhere to prescribed medication regimen . Lacks social connections  Case Manager Clinical Goal(s):  Marland Kitchen Over the next 120 days, patient will verbalize understanding of plan for hypertension management . Over the next 120 days, patient will attend all scheduled medical appointments: 09-08-2021 . Over the next 120 days, patient will demonstrate improved adherence to prescribed treatment plan for hypertension as evidenced by taking all medications as prescribed, monitoring and recording blood pressure as directed, adhering to low sodium/DASH diet . Over the next 120 days, patient will demonstrate improved health management independence as evidenced by checking blood pressure as directed and notifying PCP if SBP>160 or DBP > 90, taking all medications as prescribe, and adhering to a low sodium diet as discussed. . Over the next 120 days, patient will verbalize basic understanding of hypertension disease process and self health management plan as evidenced by compliance with medications, dietary restrictions,  and working with the CCM team to optimize health and well being  Interventions:  . Collaboration with Venita Lick, NP regarding development and update of comprehensive plan of care as evidenced by provider attestation and co-signature . Inter-disciplinary care team collaboration (see longitudinal plan of care) . Evaluation of current treatment plan related to hypertension self management and patient's adherence to plan as established by provider. 03-28-2021: Saw the provider recently and got a good report. She was taken off of HCTZ and is doing well. States blood pressure is up and down depending on her activity level but has not reached 076 systolic. Review of normal parameters.  . Provided education to patient re: stroke prevention, s/s of heart attack and stroke, DASH diet, complications of uncontrolled blood pressure . Reviewed medications with patient and discussed importance of compliance.  03-28-2021: The patient reports compliance with medications  . Discussed plans with patient for ongoing care management follow up and provided patient with direct contact information for care management team . Advised patient, providing education and rationale, to monitor blood pressure daily and record, calling PCP for findings outside established parameters.  . Reviewed scheduled/upcoming provider appointments including:  09-08-2021 Patient Goals/Self-Care Activities . Over the next 120 days, patient will:  - Self administers medications as prescribed Attends all scheduled provider appointments Calls provider office for new concerns, questions, or BP outside discussed parameters Checks BP and records as discussed- 03-28-2021: Endorses checking blood pressure regularly  Follows a low sodium diet/DASH diet - blood pressure trends reviewed - depression screen reviewed - home or ambulatory blood pressure monitoring encouraged Follow Up Plan: Telephone follow up appointment with care management team member  scheduled for: 06-18-2021 at 0900 am   Task: RNCM: Identify and Monitor Blood Pressure Elevation   Note:  Care Management Activities:    - blood pressure trends reviewed - depression screen reviewed - home or ambulatory blood pressure monitoring encouraged       Patient Care Plan: RNCM: Seizures- new onset    Problem Identified: RNCM: Health Promotion or Disease Self-Management (General Plan of Care)   Priority: High    Long-Range Goal: RNCM: Seizures management   Priority: High  Note:   Current Barriers:  Marland Kitchen Knowledge Deficits related to resources available for help with needs post seizure activity  . Chronic Disease Management support and education needs related to new onset of witnessed seizure in January 2022 . Lacks caregiver support.  . Transportation barriers- patient cannot drive at this time, says she has adequate transportation, knows there are resources available if needed for transportation. [place] social connections . Does not contact provider office for questions/concerns  Nurse Case Manager Clinical Goal(s):  . patient will verbalize understanding of plan for effective management of seizures  . patient will work with RNCM, pcp, and specialist  to address needs related to seizures  . patient will attend all scheduled medical appointments: 09-08-2021 . patient will demonstrate improved adherence to prescribed treatment plan for seizures as evidenced bymedication compliance, no new seizure activity, and working with the CCM team to manage health and well being for chronic conditions  Interventions:  . 1:1 collaboration with Venita Lick, NP regarding development and update of comprehensive plan of care as evidenced by provider attestation and co-signature . Inter-disciplinary care team collaboration (see longitudinal plan of care) . Evaluation of current treatment plan related to seizures  and patient's adherence to plan as established by provider. 03-28-2021: The  patient is doing well and denies any new seizures. She states the medications she is taking are keeping her appetite good. She does not see the specialist again until September. She is hopeful that she will not have any more seizures so she can go back to driving after July 3.  Repeat MRI shows the spot seen on her brain is gone per the patient.  Will continue to monitor.  . Advised patient to call the office for changes or questions . Provided education to patient re: taking medications as ordered, calling when medications are not there, adherence to prescribed diet, monitoring for new onset of seizure activity, driving restrictions. 03-28-2021: Review of driving restrictions, taking medications as prescribed and not missing doses, adequate sleep and rest, pacing activities. The patient has returned to the gym and feels great. Denies any issues with transportation at this time.  . Reviewed medications with patient and discussed compliance, the patient is taking Keppra and no issues noted at this time.  03-28-2021: The patient states compliance with medications regimen.  . Reviewed scheduled/upcoming provider appointments including: 02-24-2021 at 0820 with pcp, has MRI scheduled for 03-07-2021. Next pcp appointment is 09-08-2021 also will see the specialist in September  . Discussed plans with patient for ongoing care management follow up and provided patient with direct contact information for care management team  Patient Goals/Self-Care Activities Over the next 120 days, patient will:  - Patient will self administer medications as prescribed Patient will attend all scheduled provider appointments Patient will call pharmacy for medication refills Patient will attend church or other social activities Patient will continue to perform ADL's independently Patient will continue to perform IADL's independently Patient will call provider office for new concerns or questions Patient will work with BSW to address  care coordination needs and will continue to work  with the clinical team to address health care and disease management related needs.   - barriers to meeting goals identified - change-talk evoked - choices provided - collaboration with team encouraged - decision-making supported - difficulty of making life-long changes acknowledged - health risks reviewed - problem-solving facilitated - questions answered - readiness for change evaluated - reassurance provided - resources needed to meet goals identified. 03-28-2021: The patient did inquire about food resources. The patient has received gift cards x 2 within the last 6 months for food and the [employer]. Explained limitations and the request previously for food resources. The patient verbalized that she would wait to talk to the care guides about food resources that she had what she needed at this time. Will continue to monitor.  - self-reflection promoted - self-reliance encouraged - verbalization of feelings encouraged  Follow Up Plan: Telephone follow up appointment with care management team member scheduled for: 06-18-2021 at 0900 am       Task: RNCM: Seizure management   Note:   Care Management Activities:    - barriers to meeting goals identified - change-talk evoked - choices provided - collaboration with team encouraged - decision-making supported - difficulty of making life-long changes acknowledged - health risks reviewed - problem-solving facilitated - questions answered - readiness for change evaluated - reassurance provided - resources needed to meet goals identified - self-reflection promoted - self-reliance encouraged - verbalization of feelings encouraged     Problem Identified: Coping Skills (General Plan of Care)     Patient Care Plan: General Social Work (Adult)    Problem Identified: Quality of Life (General Plan of Care)     Long-Range Goal: Quality of Life Maintained   Start Date:  02/10/2021  Priority: Medium  Note:   Evidence-based guidance:   Assess patient's thoughts about quality of life, goals and expectations, and dissatisfaction or desire to improve.   Identify issues of primary importance such as mental health, illness, exercise tolerance, pain, sexual function and intimacy, cognitive change, social isolation, finances and relationships.   Assess and monitor for signs/symptoms of psychosocial concerns, especially depression or ideations regarding harm to others or self; provide or refer for mental health services as needed.   Identify sensory issues that impact quality of life such as hearing loss, vision deficit; strategize ways to maintain or improve hearing, vision.   Promote access to services in the community to support independence such as support groups, home visiting programs, financial assistance, handicapped parking tags, durable medical equipment and emergency responder.   Promote activities to decrease social isolation such as group support or social, leisure and recreational activities, employment, use of social media; consider safety concerns about being out of home for activities.   Provide patient an opportunity to share by storytelling or a "life review" to give positive meaning to life and to assist with coping and negative experiences.   Encourage patient to tap into hope to improve sense of self.   Counsel based on prognosis and as early as possible about end-of-life and palliative care; consider referral to palliative care provider.   Advocate for the development of palliative care plan that may include avoidance of unnecessary testing and intervention, symptom control, discontinuation of medications, hospice and organ donation.   Counsel as early as possible those with life-limiting chronic disease about palliative care; consider referral to palliative care provider.   Advocate for the development of palliative care plan.   Notes:     Timeframe:  Long-Range  Goal Priority:  Medium Start Date: 02/10/21                            Expected End Date:  05/10/21                     Follow Up Date- 04/04/21   - avoid negative self-talk - develop a personal safety plan - develop a plan to deal with triggers like holidays, anniversaries - exercise at least 2 to 3 times per week - have a plan for how to handle bad days - journal feelings and what helps to feel better or worse - spend time or talk with others at least 2 to 3 times per week - spend time or talk with others every day - watch for early signs of feeling worse - write in journal every day    Why is this important?    Keeping track of your progress will help your treatment team find the right mix of medicine and therapy for you.   Write in your journal every day.   Day-to-day changes in depression symptoms are normal. It may be more helpful to check your progress at the end of each week instead of every day.     Current barriers:    Patient is having depressive symptoms due to losing independence as she is unable to drive right now   Unable to consistently perform activities of daily living and needs additional assistance / support in order to meet this unmet need  Unable to  independently self manage needs related to chronic health conditions.   Knowledge deficits related to short term plan for care coordination needs and long term plans for chronic disease management needs  Clinical Goals: Over the next 120 days, patient will work with SW to address concerns related to stress management, increasing self care and coping with being unable to drive at this time Interventions : . Assessed needs, level of care concerns, basic eligibility and provided education on transportation resources that her insurance provides  . Reviewed community support options ( CAP, private pay, PACE program) . Provided emotional support and reflective listening as patient is  having depressive symptoms due to losing independence as she is unable to drive right now  . Patient interviewed and appropriate assessments performed . Provided patient with information about healthy self-care tools to implement into her daily life . Discussed plans with patient for ongoing care management follow up and provided patient with direct contact information for care management team . Assisted patient/caregiver with obtaining information about health plan benefits . Provided education and assistance to client regarding Advanced Directives. . Provided education to patient/caregiver regarding level of care options. . Other interventions provided: Motivational Interviewing, Solution-Focused Strategies, Mindfulness or Psychologist, educational, and Emotional/Supportive Counseling . Collaboration with PCP regarding development and update of comprehensive plan of care as evidenced by provider attestation and co-signature . Inter-disciplinary care team collaboration (see longitudinal plan of care)      Patient verbalizes understanding of instructions provided today and agrees to view in [place].   Telephone follow up appointment with care management team member scheduled for: 06-18-2021 at 9 am  [place], MSN, [facility] Mobile: [phone]

## 2021-03-28 NOTE — Chronic Care Management (AMB) (Signed)
Chronic Care Management   CCM RN Visit Note  03/28/2021 Name: Kelly Poole MRN: [id number] DOB: 17-Dec-1951  Subjective: Kelly Poole is a 70 y.o. year old female who is a primary care patient of Cannady, Barbaraann Faster, NP. The care management team was consulted for assistance with disease management and care coordination needs.    Engaged with patient by telephone for follow up visit in response to provider referral for case management and/or care coordination services.   Consent to Services:  The patient was given information about Chronic Care Management services, agreed to services, and gave verbal consent prior to initiation of services.  Please see initial visit note for detailed documentation.   Patient agreed to services and verbal consent obtained.   Assessment: Review of patient past medical history, allergies, medications, health status, including review of consultants reports, laboratory and other test data, was performed as part of comprehensive evaluation and provision of chronic care management services.   SDOH (Social Determinants of Health) assessments and interventions performed:    CCM Care Plan  Allergies  Allergen Reactions  . Macrobid [Nitrofurantoin Macrocrystal] Other (See Comments)    Sore throat and irritation to throat    Outpatient Encounter Medications as of 03/28/2021  Medication Sig Note  . Ascorbic Acid (VITAMIN C) 1000 MG tablet Take 1,000 mg by mouth daily.   Marland Kitchen aspirin 81 MG tablet Take 81 mg by mouth daily.   Marland Kitchen atorvastatin (LIPITOR) 20 MG tablet Take 1 tablet (20 mg total) by mouth daily.   . Cholecalciferol (VITAMIN D-3) 125 MCG (5000 UT) TABS Take 1 tablet by mouth daily. Takes every other day   . ferrous sulfate 325 (65 FE) MG tablet Take 1 tablet (325 mg total) by mouth daily with breakfast.   . fluticasone (FLONASE) 50 MCG/ACT nasal spray Place 2 sprays into both nostrils daily for 14 days.   . Garlic 6789 MG CAPS Take 1,000 mg by mouth  daily. (Patient not taking: Reported on 02/24/2021) 10/12/2019: Doesn't take regular  . hydrochlorothiazide (HYDRODIURIL) 25 MG tablet Take 1 tablet (25 mg total) by mouth daily.   Marland Kitchen levETIRAcetam (KEPPRA) 500 MG tablet Take 1 tablet (500 mg total) by mouth 2 (two) times daily. New seizure medication.   Marland Kitchen losartan (COZAAR) 100 MG tablet Take 1 tablet (100 mg total) by mouth daily.   . Multiple Vitamin (MULTIVITAMINS PO) Take by mouth daily.   Marland Kitchen omeprazole (PRILOSEC) 20 MG capsule Take 1 capsule (20 mg total) by mouth daily.   . vitamin E 100 UNIT capsule Take 100 Units by mouth daily.    No facility-administered encounter medications on file as of 03/28/2021.    Patient Active Problem List   Diagnosis Date Noted  . Secondary hyperparathyroidism ([place]) 12/23/2020  . Seizure ([place]) 12/23/2020  . Anemia 07/30/2020  . Osteoarthritis of right knee 09/07/2019  . Vasomotor symptoms due to menopause 03/24/2019  . CKD (chronic kidney disease) stage 3, GFR 30-59 ml/min ([facility]) 10/05/2016  . Vitamin B12 deficiency (dietary) anemia 12/27/2015  . Hypertension 12/27/2015  . Insomnia 06/03/2015  . Thyroid goiter 06/03/2015  . HX: breast cancer 06/03/2015  . Gastroesophageal reflux disease 06/03/2015  . Hyperlipidemia 06/03/2015    Conditions to be addressed/monitored:HTN, CKD Stage 3 and seizures   Care Plan : RNCM: Chronic Kidney (Adult)  Updates made by Vanita Ingles since 03/28/2021 12:00 AM    Problem: RNCM: Adjustment to Chronic Kidney Disease   Priority: Medium    Long-Range Goal:  RNCM: CKD3 Management   Priority: Medium  Note:   Current Barriers:  Marland Kitchen Knowledge Deficits related to resources in the community for management of CKD3 and chronic conditions  . Chronic Disease Management support and education needs related to effective management of CKD3 . Lacks caregiver support.  . Non-adherence to prescribed medication regimen . Does not adhere to prescribed medication regimen . Lacks social  connections . Does not contact provider office for questions/concerns  Nurse Case Manager Clinical Goal(s):  . patient will verbalize understanding of plan for effective management of CKD3 . patient will work with [facility] and pcp  to address needs related to CKD3 . patient will attend all scheduled medical appointments: 09-08-2021 with pcp . patient will demonstrate improved health management independence as evidenced bylabs stable with no decline, adherence to dietary restrictions, adherence to medications, and working with the CCM team to optimize health and well being   Interventions:  . 1:1 collaboration with Venita Lick, NP regarding development and update of comprehensive plan of care as evidenced by provider attestation and co-signature . Inter-disciplinary care team collaboration (see longitudinal plan of care) . Evaluation of current treatment plan related to CKD3 and patient's adherence to plan as established by provider. 03-28-2021: The patient saw the pcp on 3-7 and had blood work. Hydrochlorothiazide discontinued after lab work. The patient continues to see kidney specialist on a regular basis. The patient is following a renal diet and monitoring her food content. Will continue to monitor. . Advised patient to call the office for questions or concerns . Provided education to patient re: following heart healthy renal diet. 03-28-2021: Is eating well. Denies any issues with dietary restrictions  . Reviewed medications with patient and discussed compliance. The patient states she has her medications and is taking as prescribed . Reviewed scheduled/upcoming provider appointments including: 09-08-2021 with pcp . Discussed plans with patient for ongoing care management follow up and provided patient with direct contact information for care management team  Patient Goals/Self-Care Activities Over the next 120 days, patient will:  - Patient will self administer medications as  prescribed Patient will attend all scheduled provider appointments Patient will call pharmacy for medication refills Patient will attend church or other social activities Patient will continue to perform ADL's independently Patient will continue to perform IADL's independently Patient will call provider office for new concerns or questions Patient will work with BSW to address care coordination needs and will continue to work with the clinical team to address health care and disease management related needs.   - decision-making supported - depression screen reviewed - goal setting facilitated - positive reinforcement provided - problem-solving facilitated - relaxation techniques promoted - self-care encouraged - self-reflection promoted - verbalization of feelings encouraged  Follow Up Plan: Telephone follow up appointment with care management team member scheduled for: 06-18-2021 at 0900 am       Care Plan : RNCM: Hypertension (Adult)  Updates made by Vanita Ingles since 03/28/2021 12:00 AM    Problem: RNCM: Hypertension (Hypertension)   Priority: Medium    Long-Range Goal: RNCM: Hypertension Monitored   Priority: Medium  Note:   Objective:  . Last practice recorded BP readings:  . BP Readings from Last 3 Encounters: .  02/24/21 . 100/68 .  02/07/21 . 109/80 .  12/31/20 . 129/81 .    Marland Kitchen Most recent eGFR/CrCl: No results found for: EGFR  No components found for: CRCL Current Barriers:  Marland Kitchen Knowledge Deficits related to basic understanding of  hypertension pathophysiology and self care management . Knowledge Deficits related to understanding of medications prescribed for management of hypertension . Non-adherence to prescribed medication regimen- was without her medications for HTN in January x 1 week  . Limited Social Support . Unable to self administer medications as prescribed . Does not adhere to prescribed medication regimen . Lacks social connections  Case Manager  Clinical Goal(s):  Marland Kitchen Over the next 120 days, patient will verbalize understanding of plan for hypertension management . Over the next 120 days, patient will attend all scheduled medical appointments: 09-08-2021 . Over the next 120 days, patient will demonstrate improved adherence to prescribed treatment plan for hypertension as evidenced by taking all medications as prescribed, monitoring and recording blood pressure as directed, adhering to low sodium/DASH diet . Over the next 120 days, patient will demonstrate improved health management independence as evidenced by checking blood pressure as directed and notifying PCP if SBP>160 or DBP > 90, taking all medications as prescribe, and adhering to a low sodium diet as discussed. . Over the next 120 days, patient will verbalize basic understanding of hypertension disease process and self health management plan as evidenced by compliance with medications, dietary restrictions, and working with the CCM team to optimize health and well being  Interventions:  . Collaboration with Venita Lick, NP regarding development and update of comprehensive plan of care as evidenced by provider attestation and co-signature . Inter-disciplinary care team collaboration (see longitudinal plan of care) . Evaluation of current treatment plan related to hypertension self management and patient's adherence to plan as established by provider. 03-28-2021: Saw the provider recently and got a good report. She was taken off of HCTZ and is doing well. States blood pressure is up and down depending on her activity level but has not reached 903 systolic. Review of normal parameters.  . Provided education to patient re: stroke prevention, s/s of heart attack and stroke, DASH diet, complications of uncontrolled blood pressure . Reviewed medications with patient and discussed importance of compliance.  03-28-2021: The patient reports compliance with medications  . Discussed plans with  patient for ongoing care management follow up and provided patient with direct contact information for care management team . Advised patient, providing education and rationale, to monitor blood pressure daily and record, calling PCP for findings outside established parameters.  . Reviewed scheduled/upcoming provider appointments including:  09-08-2021 Patient Goals/Self-Care Activities . Over the next 120 days, patient will:  - Self administers medications as prescribed Attends all scheduled provider appointments Calls provider office for new concerns, questions, or BP outside discussed parameters Checks BP and records as discussed- 03-28-2021: Endorses checking blood pressure regularly  Follows a low sodium diet/DASH diet - blood pressure trends reviewed - depression screen reviewed - home or ambulatory blood pressure monitoring encouraged Follow Up Plan: Telephone follow up appointment with care management team member scheduled for: 06-18-2021 at 0900 am   Care Plan : RNCM: Seizures- new onset  Updates made by Vanita Ingles since 03/28/2021 12:00 AM    Problem: RNCM: Health Promotion or Disease Self-Management (General Plan of Care)   Priority: High    Long-Range Goal: RNCM: Seizures management   Priority: High  Note:   Current Barriers:  Marland Kitchen Knowledge Deficits related to resources available for help with needs post seizure activity  . Chronic Disease Management support and education needs related to new onset of witnessed seizure in January 2022 . Lacks caregiver support.  . Transportation barriers- patient cannot drive  at this time, says she has adequate transportation, knows there are resources available if needed for transportation. [place] social connections . Does not contact provider office for questions/concerns  Nurse Case Manager Clinical Goal(s):  . patient will verbalize understanding of plan for effective management of seizures  . patient will work with RNCM, pcp, and  specialist  to address needs related to seizures  . patient will attend all scheduled medical appointments: 09-08-2021 . patient will demonstrate improved adherence to prescribed treatment plan for seizures as evidenced bymedication compliance, no new seizure activity, and working with the CCM team to manage health and well being for chronic conditions  Interventions:  . 1:1 collaboration with Venita Lick, NP regarding development and update of comprehensive plan of care as evidenced by provider attestation and co-signature . Inter-disciplinary care team collaboration (see longitudinal plan of care) . Evaluation of current treatment plan related to seizures  and patient's adherence to plan as established by provider. 03-28-2021: The patient is doing well and denies any new seizures. She states the medications she is taking are keeping her appetite good. She does not see the specialist again until September. She is hopeful that she will not have any more seizures so she can go back to driving after July 3.  Repeat MRI shows the spot seen on her brain is gone per the patient.  Will continue to monitor.  . Advised patient to call the office for changes or questions . Provided education to patient re: taking medications as ordered, calling when medications are not there, adherence to prescribed diet, monitoring for new onset of seizure activity, driving restrictions. 03-28-2021: Review of driving restrictions, taking medications as prescribed and not missing doses, adequate sleep and rest, pacing activities. The patient has returned to the gym and feels great. Denies any issues with transportation at this time.  . Reviewed medications with patient and discussed compliance, the patient is taking Keppra and no issues noted at this time.  03-28-2021: The patient states compliance with medications regimen.  . Reviewed scheduled/upcoming provider appointments including: 02-24-2021 at 0820 with pcp, has MRI scheduled  for 03-07-2021. Next pcp appointment is 09-08-2021 also will see the specialist in September  . Discussed plans with patient for ongoing care management follow up and provided patient with direct contact information for care management team  Patient Goals/Self-Care Activities Over the next 120 days, patient will:  - Patient will self administer medications as prescribed Patient will attend all scheduled provider appointments Patient will call pharmacy for medication refills Patient will attend church or other social activities Patient will continue to perform ADL's independently Patient will continue to perform IADL's independently Patient will call provider office for new concerns or questions Patient will work with BSW to address care coordination needs and will continue to work with the clinical team to address health care and disease management related needs.   - barriers to meeting goals identified - change-talk evoked - choices provided - collaboration with team encouraged - decision-making supported - difficulty of making life-long changes acknowledged - health risks reviewed - problem-solving facilitated - questions answered - readiness for change evaluated - reassurance provided - resources needed to meet goals identified. 03-28-2021: The patient did inquire about food resources. The patient has received gift cards x 2 within the last 6 months for food and the [employer]. Explained limitations and the request previously for food resources. The patient verbalized that she would wait to talk to the care guides about  food resources that she had what she needed at this time. Will continue to monitor.  - self-reflection promoted - self-reliance encouraged - verbalization of feelings encouraged  Follow Up Plan: Telephone follow up appointment with care management team member scheduled for: 06-18-2021 at 0900 am         Plan:Telephone follow up appointment with care  management team member scheduled for:  06-18-2021 at 6 am  [place], MSN, [facility] Mobile: [phone]

## 2021-03-31 ENCOUNTER — Telehealth: Payer: Medicare HMO

## 2021-03-31 NOTE — Progress Notes (Incomplete)
Chronic Care Management Pharmacy Note  03/31/2021 Name:  Kelly NATHANSON MRN:  [id number] DOB:  [birth year]  Subjective: Kelly Poole is an 70 y.o. year old female who is a primary patient of Cannady, Barbaraann Faster, NP.  The CCM team was consulted for assistance with disease management and care coordination needs.    Engaged with patient by telephone for follow up visit in response to provider referral for pharmacy case management and/or care coordination services.   Consent to Services:  The patient was given information about Chronic Care Management services, agreed to services, and gave verbal consent prior to initiation of services.  Please see initial visit note for detailed documentation.   Patient Care Team: Venita Lick, NP as PCP - General (Nurse Practitioner) Vanita Ingles, RN as Case Manager (General Practice) Vladimir Faster, [facility] (Pharmacist) Greg Cutter, LCSW as [place] Management (Licensed Clinical Social Worker) Alyce Pagan as [place] Management (Licensed Clinical Social Worker)  Recent office visits:  3/07/22Ned Card (PCP)- blood work, stop hctz, follow with nephro  Recent consult visits: 3/24/22Manuella Ghazi (neurology)- BP 178/87, continue keppra 500 mg bid, no driving 6 mos from last seizure 3/21/22Manuella Ghazi (neurology)- MR brainw wo contrast stable & minimal chronic small vessel ischemic disease  Hospital visits: 1/03/22[facility] ED to admission seizure  Objective:  Lab Results  Component Value Date   CREATININE 1.31 (H) 03/17/2021   BUN 17 03/17/2021   GFRNONAA 42 (L) 12/26/2020   GFRAA 45 (L) 07/30/2020   NA 141 03/17/2021   K 4.1 03/17/2021   CALCIUM 9.4 03/17/2021   CO2 21 03/17/2021   GLUCOSE 91 03/17/2021    Lab Results  Component Value Date/Time   MICROALBUR 80 (H) 03/17/2021 08:05 AM    Last diabetic Eye exam: No results found for: HMDIABEYEEXA  Last diabetic Foot exam: No results  found for: HMDIABFOOTEX   Lab Results  Component Value Date   CHOL 202 (H) 02/24/2021   HDL 51 02/24/2021   LDLCALC 136 (H) 02/24/2021   TRIG 85 02/24/2021   CHOLHDL 2.8 08/23/2018    Hepatic Function Latest Ref Rng & Units 02/24/2021 12/24/2020 12/23/2020  Total Protein 6.0 - 8.5 g/dL 7.6 7.1 7.9  Albumin 3.8 - 4.8 g/dL 4.6 3.6 4.0  AST 0 - 40 IU/L 26 42(H) 56(H)  ALT 0 - 32 IU/L 22 42 56(H)  Alk Phosphatase 44 - 121 IU/L 88 78 87  Total Bilirubin 0.0 - 1.2 mg/dL 0.3 1.0 0.6    Lab Results  Component Value Date/Time   TSH 2.850 02/24/2021 08:59 AM   TSH 1.600 02/16/2020 04:24 PM    CBC Latest Ref Rng & Units 02/24/2021 12/26/2020 12/25/2020  WBC 3.4 - 10.8 x10E3/uL 6.2 10.7(H) 9.2  Hemoglobin 11.1 - 15.9 g/dL 9.3(L) 8.9(L) 9.0(L)  Hematocrit 34.0 - 46.6 % 29.5(L) 27.1(L) 26.5(L)  Platelets 150 - 450 x10E3/uL 292 271 281    Lab Results  Component Value Date/Time   VD25OH 63.3 02/16/2020 04:24 PM    Clinical ASCVD: {YES/NO:21197} The 10-year ASCVD risk score Mikey Bussing DC Jr., et al., 2013) is: 20.5%   Values used to calculate the score:     Age: 58 years     Sex: Female     Is Non-Hispanic African American: Yes     Diabetic: No     Tobacco smoker: No     Systolic Blood Pressure: 144 mmHg  Is BP treated: Yes     HDL Cholesterol: 51 mg/dL     Total Cholesterol: 202 mg/dL    Depression screen [facility] 2/9 12/31/2020 10/14/2020 04/19/2020  Decreased Interest 0 0 0  Down, Depressed, Hopeless 0 0 0  PHQ - 2 Score 0 0 0  Altered sleeping 0 - -  Tired, decreased energy 0 - -  Change in appetite 0 - -  Feeling bad or failure about yourself  0 - -  Trouble concentrating 0 - -  Moving slowly or fidgety/restless 0 - -  Suicidal thoughts 0 - -  PHQ-9 Score 0 - -     ***Other: (CHADS2VASc if Afib, MMRC or CAT for COPD, ACT, DEXA)  Social History   Tobacco Use  Smoking Status Never Smoker  Smokeless Tobacco Never Used   BP Readings from Last 3 Encounters:  02/24/21 100/68   02/07/21 109/80  12/31/20 129/81   Pulse Readings from Last 3 Encounters:  02/24/21 77  12/31/20 80  12/26/20 96   Wt Readings from Last 3 Encounters:  02/24/21 140 lb 6.4 oz (63.7 kg)  12/31/20 139 lb 6.4 oz (63.2 kg)  12/25/20 141 lb 12.1 oz (64.3 kg)   BMI Readings from Last 3 Encounters:  02/24/21 30.38 kg/m  12/31/20 30.17 kg/m  12/25/20 31.78 kg/m    Assessment/Interventions: Review of patient past medical history, allergies, medications, health status, including review of consultants reports, laboratory and other test data, was performed as part of comprehensive evaluation and provision of chronic care management services.   SDOH:  (Social Determinants of Health) assessments and interventions performed: {yes/no:20286}  SDOH Screenings   Alcohol Screen: Low Risk   . Last Alcohol Screening Score (AUDIT): 0  Depression (PHQ2-9): Low Risk   . PHQ-2 Score: 0  Financial Resource Strain: Medium Risk  . Difficulty of Paying Living Expenses: Somewhat hard  Food Insecurity: No Food Insecurity  . Worried About Charity fundraiser in the Last Year: Never true  . Ran Out of Food in the Last Year: Never true  Housing: Low Risk   . Last Housing Risk Score: 0  Physical Activity: Sufficiently Active  . Days of Exercise per Week: 3 days  . Minutes of Exercise per Session: 90 min  Social Connections: Moderately Integrated  . Frequency of Communication with Friends and Family: More than three times a week  . Frequency of Social Gatherings with Friends and Family: More than three times a week  . Attends Religious Services: More than 4 times per year  . Active Member of Clubs or Organizations: No  . Attends Archivist Meetings: Never  . Marital Status: Married  Stress: No Stress Concern Present  . Feeling of Stress : Not at all  Tobacco Use: Low Risk   . Smoking Tobacco Use: Never Smoker  . Smokeless Tobacco Use: Never Used  Transportation Needs: No Transportation  Needs  . Lack of Transportation (Medical): No  . Lack of Transportation (Non-Medical): No    CCM Care Plan  Allergies  Allergen Reactions  . Macrobid [Nitrofurantoin Macrocrystal] Other (See Comments)    Sore throat and irritation to throat    Medications Reviewed Today    Reviewed by Vanita Ingles on 03/28/21 at 0927  Med List Status: <None>  Medication Order Taking? Sig Documenting Provider Last Dose Status Informant  Ascorbic Acid (VITAMIN C) 1000 MG tablet [id number] No Take 1,000 mg by mouth daily. [provider] Taking Active Pharmacy Records  aspirin 81 MG tablet [id number] No Take 81 mg by mouth daily. [provider] Taking Active Pharmacy Records  atorvastatin (LIPITOR) 20 MG tablet [id number]  Take 1 tablet (20 mg total) by mouth daily. Marnee Guarneri T, NP  Active   Cholecalciferol (VITAMIN D-3) 125 MCG (5000 UT) TABS [id number] No Take 1 tablet by mouth daily. Takes every other day [provider] Taking Active Pharmacy Records  ferrous sulfate 325 (65 FE) MG tablet [id number] No Take 1 tablet (325 mg total) by mouth daily with breakfast. Venita Lick, NP Taking Active Pharmacy Records  fluticasone [facility]) 50 MCG/ACT nasal spray [id number] No Place 2 sprays into both nostrils daily for 14 days. Myles Gip, DO Unknown Unknown Expired 12/24/20 2359 Pharmacy Records  Garlic 4917 MG CAPS [id number] No Take 1,000 mg by mouth daily.  Patient not taking: Reported on 02/24/2021   [provider] Not Taking Active Pharmacy Records           Med Note ([facility], [facility]   Thu Oct 12, 2019  3:09 PM) Doesn't take regular  hydrochlorothiazide (HYDRODIURIL) 25 MG tablet [id number] No Take 1 tablet (25 mg total) by mouth daily. Marnee Guarneri T, NP Taking Active Pharmacy Records  levETIRAcetam (KEPPRA) 500 MG tablet [id number] No Take 1 tablet (500 mg total) by mouth 2 (two) times daily. New seizure medication. Enzo Bi, MD Taking Expired 03/26/21  2359   losartan (COZAAR) 100 MG tablet [id number] No Take 1 tablet (100 mg total) by mouth daily. Marnee Guarneri T, NP Taking Active   Multiple Vitamin (MULTIVITAMINS PO) [id number] No Take by mouth daily. [provider] Taking Active Pharmacy Records  omeprazole (PRILOSEC) 20 MG capsule [id number] No Take 1 capsule (20 mg total) by mouth daily. Venita Lick, NP Taking Active Pharmacy Records  vitamin E 100 UNIT capsule [id number] No Take 100 Units by mouth daily. [provider] Taking Active Pharmacy Records          Patient Active Problem List   Diagnosis Date Noted  . Secondary hyperparathyroidism ([place]) 12/23/2020  . Seizure ([place]) 12/23/2020  . Anemia 07/30/2020  . Osteoarthritis of right knee 09/07/2019  . Vasomotor symptoms due to menopause 03/24/2019  . CKD (chronic kidney disease) stage 3, GFR 30-59 ml/min (HCC) 10/05/2016  . Vitamin B12 deficiency (dietary) anemia 12/27/2015  . Hypertension 12/27/2015  . Insomnia 06/03/2015  . Thyroid goiter 06/03/2015  . HX: breast cancer 06/03/2015  . Gastroesophageal reflux disease 06/03/2015  . Hyperlipidemia 06/03/2015    Immunization History  Administered Date(s) Administered  . Moderna Sars-Covid-2 Vaccination 02/12/2020, 03/12/2020, 11/30/2020, 12/08/2020  . Td 12/22/2003    Conditions to be addressed/monitored:  {USCCMDZASSESSMENTOPTIONS:23563}  There are no care plans that you recently modified to display for this patient.    Medication Assistance: {MEDASSISTANCEINFO:25044}  Patient's preferred pharmacy is:  [facility] [address] (N), [place] [address] Phone: [phone] Fax: [facility] - [place], [place] [address] Phone: [phone] Fax: [phone]  Uses pill box? {Yes or If no, why not?:20788} Pt endorses ***%  compliance  We discussed: {Pharmacy options:24294} Patient decided to: {US Pharmacy Plan:23885}  Care Plan and Follow Up Patient Decision:  {FOLLOWUP:24991}  Plan: {CM FOLLOW UP PLAN:25073}  ***

## 2021-04-01 ENCOUNTER — Telehealth: Payer: Medicare HMO

## 2021-04-01 NOTE — Progress Notes (Unsigned)
Chronic Care Management Pharmacy Note  04/01/2021 Name:  Kelly Poole MRN:  [id number] DOB:  [birth year]  Subjective: Kelly Poole is an 70 y.o. year old female who is a primary patient of Cannady, Barbaraann Faster, NP.  The CCM team was consulted for assistance with disease management and care coordination needs.    Engaged with patient by telephone for follow up visit in response to provider referral for pharmacy case management and/or care coordination services.   Consent to Services:  The patient was given information about Chronic Care Management services, agreed to services, and gave verbal consent prior to initiation of services.  Please see initial visit note for detailed documentation.   Patient Care Team: Venita Lick, NP as PCP - General (Nurse Practitioner) Vanita Ingles, RN as Case Manager (General Practice) Vladimir Faster, [facility] (Pharmacist) Greg Cutter, LCSW as [place] Management (Licensed Clinical Social Worker) Alyce Pagan as [place] Management (Licensed Clinical Social Worker)  Recent office visits: 03/17/21- lab visit- SCR improved at 1.31 eGFR 45, PTH 81, Ur microabl 80, microalb/creat ratio 30-300 02/24/21- Cannady(PCP)- blood work, BP 100/68, GFR decrease--needs to f/u with nephro, stop hctz, f/u labs in one week, f/u with hematoology for anemia, increase atorvastatin to 20 mg  Recent consult visits: 03/13/21- Shah(Neuro)- continue Keppra 500 mg bid, rt front cortical hyperintensity--need to r/o glioma, rpt MRI showed encephalomalacia, needs very aggressive BP mgmnt-- in office BP 178/87, no driving 6 mos from last seizure  Hospital visits: 12/23/20- New  Onset seizure--  Objective:  Lab Results  Component Value Date   CREATININE 1.31 (H) 03/17/2021   BUN 17 03/17/2021   GFRNONAA 42 (L) 12/26/2020   GFRAA 45 (L) 07/30/2020   NA 141 03/17/2021   K 4.1 03/17/2021   CALCIUM 9.4 03/17/2021   CO2 21  03/17/2021   GLUCOSE 91 03/17/2021    Lab Results  Component Value Date/Time   MICROALBUR 80 (H) 03/17/2021 08:05 AM    Last diabetic Eye exam: No results found for: HMDIABEYEEXA  Last diabetic Foot exam: No results found for: HMDIABFOOTEX   Lab Results  Component Value Date   CHOL 202 (H) 02/24/2021   HDL 51 02/24/2021   LDLCALC 136 (H) 02/24/2021   TRIG 85 02/24/2021   CHOLHDL 2.8 08/23/2018    Hepatic Function Latest Ref Rng & Units 02/24/2021 12/24/2020 12/23/2020  Total Protein 6.0 - 8.5 g/dL 7.6 7.1 7.9  Albumin 3.8 - 4.8 g/dL 4.6 3.6 4.0  AST 0 - 40 IU/L 26 42(H) 56(H)  ALT 0 - 32 IU/L 22 42 56(H)  Alk Phosphatase 44 - 121 IU/L 88 78 87  Total Bilirubin 0.0 - 1.2 mg/dL 0.3 1.0 0.6    Lab Results  Component Value Date/Time   TSH 2.850 02/24/2021 08:59 AM   TSH 1.600 02/16/2020 04:24 PM    CBC Latest Ref Rng & Units 02/24/2021 12/26/2020 12/25/2020  WBC 3.4 - 10.8 x10E3/uL 6.2 10.7(H) 9.2  Hemoglobin 11.1 - 15.9 g/dL 9.3(L) 8.9(L) 9.0(L)  Hematocrit 34.0 - 46.6 % 29.5(L) 27.1(L) 26.5(L)  Platelets 150 - 450 x10E3/uL 292 271 281    Lab Results  Component Value Date/Time   VD25OH 63.3 02/16/2020 04:24 PM    Clinical ASCVD: No  The 10-year ASCVD risk score Mikey Bussing DC Jr., et al., 2013) is: 20.5%   Values used to calculate the score:     Age: 72 years     Sex:  Female     Is Non-Hispanic African American: Yes     Diabetic: No     Tobacco smoker: No     Systolic Blood Pressure: 448 mmHg     Is BP treated: Yes     HDL Cholesterol: 51 mg/dL     Total Cholesterol: 202 mg/dL    Depression screen [facility] 2/9 12/31/2020 10/14/2020 04/19/2020  Decreased Interest 0 0 0  Down, Depressed, Hopeless 0 0 0  PHQ - 2 Score 0 0 0  Altered sleeping 0 - -  Tired, decreased energy 0 - -  Change in appetite 0 - -  Feeling bad or failure about yourself  0 - -  Trouble concentrating 0 - -  Moving slowly or fidgety/restless 0 - -  Suicidal thoughts 0 - -  PHQ-9 Score 0 - -      Social  History   Tobacco Use  Smoking Status Never Smoker  Smokeless Tobacco Never Used   BP Readings from Last 3 Encounters:  02/24/21 100/68  02/07/21 109/80  12/31/20 129/81   Pulse Readings from Last 3 Encounters:  02/24/21 77  12/31/20 80  12/26/20 96   Wt Readings from Last 3 Encounters:  02/24/21 140 lb 6.4 oz (63.7 kg)  12/31/20 139 lb 6.4 oz (63.2 kg)  12/25/20 141 lb 12.1 oz (64.3 kg)   BMI Readings from Last 3 Encounters:  02/24/21 30.38 kg/m  12/31/20 30.17 kg/m  12/25/20 31.78 kg/m    Assessment/Interventions: Review of patient past medical history, allergies, medications, health status, including review of consultants reports, laboratory and other test data, was performed as part of comprehensive evaluation and provision of chronic care management services.   SDOH:  (Social Determinants of Health) assessments and interventions performed: No  SDOH Screenings   Alcohol Screen: Low Risk   . Last Alcohol Screening Score (AUDIT): 0  Depression (PHQ2-9): Low Risk   . PHQ-2 Score: 0  Financial Resource Strain: Medium Risk  . Difficulty of Paying Living Expenses: Somewhat hard  Food Insecurity: No Food Insecurity  . Worried About Charity fundraiser in the Last Year: Never true  . Ran Out of Food in the Last Year: Never true  Housing: Low Risk   . Last Housing Risk Score: 0  Physical Activity: Sufficiently Active  . Days of Exercise per Week: 3 days  . Minutes of Exercise per Session: 90 min  Social Connections: Moderately Integrated  . Frequency of Communication with Friends and Family: More than three times a week  . Frequency of Social Gatherings with Friends and Family: More than three times a week  . Attends Religious Services: More than 4 times per year  . Active Member of Clubs or Organizations: No  . Attends Archivist Meetings: Never  . Marital Status: Married  Stress: No Stress Concern Present  . Feeling of Stress : Not at all  Tobacco  Use: Low Risk   . Smoking Tobacco Use: Never Smoker  . Smokeless Tobacco Use: Never Used  Transportation Needs: No Transportation Needs  . Lack of Transportation (Medical): No  . Lack of Transportation (Non-Medical): No      Immunization History  Administered Date(s) Administered  . Moderna Sars-Covid-2 Vaccination 02/12/2020, 03/12/2020, 11/30/2020, 12/08/2020  . Td 12/22/2003    Conditions to be addressed/monitored:  Hypertension, Hyperlipidemia, GERD, Chronic Kidney Disease, Osteoarthritis and anemia and seizure  There are no care plans that you recently modified to display for this patient.  Medication Assistance: {MEDASSISTANCEINFO:25044}  Patient's preferred pharmacy is:  [facility] [address] ([address]), [place] - [place]) [place] [address] Phone: [phone] Fax: [facility] - [place], [place] [address] Phone: [phone] Fax: [phone]  Uses pill box? {Yes or If no, why not?:20788} Pt endorses ***% compliance  We discussed: {Pharmacy options:24294} Patient decided to: {US Pharmacy Plan:23885}  Care Plan and Follow Up Patient Decision:  {FOLLOWUP:24991}  Plan: {CM FOLLOW UP PLAN:25073}  ***  Current Barriers:  . {pharmacybarriers:24917} . ***  Pharmacist Clinical Goal(s):  Marland Kitchen Patient will {PHARMACYGOALCHOICES:24921} through collaboration with PharmD and provider.  . ***  Interventions: . 1:1 collaboration with Venita Lick, NP regarding development and update of comprehensive plan of care as evidenced by provider attestation and co-signature . Inter-disciplinary care team collaboration (see longitudinal plan of care) . Comprehensive medication review performed; medication list updated in electronic medical record  Hypertension CKD (BP goal <130/80) -Not ideally controlled -Current  treatment: . Losartan 100 mg qd -Medications previously tried: hctz  -Current home readings: 109/74, -Current dietary habits: *** -Current exercise habits: *** -{ACTIONS;DENIES/REPORTS:21021675::"Denies"} hypotensive/hypertensive symptoms -Educated on {CCM BP Counseling:25124} -Counseled to monitor BP at home ***, document, and provide log at future appointments -{CCMPHARMDINTERVENTION:25122}  Lab Results  Component Value Date   LDLCALC 136 (H) 02/24/2021    Hyperlipidemia: (LDL goal < 70) -Uncontrolled -Current treatment: . Atorvastatin 20 mg? -Medications previously tried: ***  -Current dietary patterns: *** -Current exercise habits: *** -Educated on {CCM HLD Counseling:25126} -{CCMPHARMDINTERVENTION:25122}  *** (Goal: ***) -{US controlled/uncontrolled:25276} -Current treatment  . *** -Medications previously tried: ***  -{CCMPHARMDINTERVENTION:25122}  Health Maintenance -Vaccine gaps: *** -Current therapy:  . *** -Educated on {ccm supplement counseling:25128} -{CCM Patient satisfied:25129} -{CCMPHARMDINTERVENTION:25122}  Osteoporosis / Osteopenia (Goal ***) -{US controlled/uncontrolled:25276} -Last DEXA Scan: ***   T-Score femoral neck: ***  T-Score total hip: ***  T-Score lumbar spine: ***  T-Score forearm radius: ***  10-year probability of major osteoporotic fracture: ***  10-year probability of hip fracture: *** -Patient {is;is not an osteoporosis candidate:23886} -Current treatment  . *** -Medications previously tried: ***  -{Osteoporosis Counseling:23892} -{CCMPHARMDINTERVENTION:25122}    Patient Goals/Self-Care Activities . Patient will:  - {pharmacypatientgoals:24919}  Follow Up Plan: {CM FOLLOW UP QIHK:74259}

## 2021-04-04 ENCOUNTER — Telehealth: Payer: Medicare HMO

## 2021-04-07 ENCOUNTER — Telehealth: Payer: Medicare HMO

## 2021-04-16 ENCOUNTER — Telehealth: Payer: Self-pay | Admitting: Nurse Practitioner

## 2021-04-16 NOTE — Telephone Encounter (Signed)
Pt would like to know if OK for her to take trazodone and her seizure medication together? Pt states she is still not sleeping and she has some trazodone left over from before. She remembers the trazodone did help her sleep. Please advise

## 2021-04-16 NOTE — Telephone Encounter (Signed)
I would recommend against this right now as Trazodone and Keppra both have sedating effects which can be enhanced by taking both together.  Would prefer she try Melatonin 10 MG at night, this will not cause interaction with Keppra on review.

## 2021-04-16 NOTE — Telephone Encounter (Signed)
Called pt advised of Jolene's message. Pt verbalizes understanding and will try Melatonin.

## 2021-04-22 ENCOUNTER — Telehealth: Payer: Self-pay | Admitting: Pharmacist

## 2021-04-22 NOTE — Chronic Care Management (AMB) (Signed)
Chronic Care Management Pharmacy Assistant   Name: Kelly Poole  MRN: [id number] DOB: [birth year]   Reason for Encounter: Disease State-BP    Recent office visits:  None noted  Recent consult visits:  03/24/2022[facility](Neurologist) Patient was seen for seizures.Return in about 6 months.  [facility] visits:  Medication Reconciliation was completed by comparing discharge summary, patient's EMR and Pharmacy list, and upon discussion with patient.  Admitted to the [facility] on 12/23/2020 due to Seizure. Discharge date was 12/26/2020. Discharged from [facility].    New?Medications Started at [facility] Discharge:?? -started Keppra 500 mg twice daily due to Seizures  Medication Changes at [facility] Discharge: -Changed none noted  Medications Discontinued at [facility] Discharge: -Stopped None  Medications that remain the same after [facility] Discharge:??  -All other medications will remain the same.    Medications: Outpatient Encounter Medications as of 04/22/2021  Medication Sig Note  . Ascorbic Acid (VITAMIN C) 1000 MG tablet Take 1,000 mg by mouth daily.   Marland Kitchen aspirin 81 MG tablet Take 81 mg by mouth daily.   Marland Kitchen atorvastatin (LIPITOR) 20 MG tablet Take 1 tablet (20 mg total) by mouth daily.   . Cholecalciferol (VITAMIN D-3) 125 MCG (5000 UT) TABS Take 1 tablet by mouth daily. Takes every other day   . ferrous sulfate 325 (65 FE) MG tablet Take 1 tablet (325 mg total) by mouth daily with breakfast.   . fluticasone (FLONASE) 50 MCG/ACT nasal spray Place 2 sprays into both nostrils daily for 14 days.   . Garlic 9371 MG CAPS Take 1,000 mg by mouth daily. (Patient not taking: Reported on 02/24/2021) 10/12/2019: Doesn't take regular  . hydrochlorothiazide (HYDRODIURIL) 25 MG tablet Take 1 tablet (25 mg total) by mouth daily. (Patient not taking: Reported on 04/01/2021)   . levETIRAcetam (KEPPRA) 500 MG tablet Take 1 tablet (500 mg total) by mouth 2 (two) times daily. New seizure  medication.   Marland Kitchen losartan (COZAAR) 100 MG tablet Take 1 tablet (100 mg total) by mouth daily.   . Multiple Vitamin (MULTIVITAMINS PO) Take by mouth daily.   Marland Kitchen omeprazole (PRILOSEC) 20 MG capsule Take 1 capsule (20 mg total) by mouth daily.   . vitamin E 100 UNIT capsule Take 100 Units by mouth daily.    No facility-administered encounter medications on file as of 04/22/2021.   Reviewed chart prior to disease state call. Spoke with patient regarding BP  Recent Office Vitals: BP Readings from Last 3 Encounters:  02/24/21 100/68  02/07/21 109/80  12/31/20 129/81   Pulse Readings from Last 3 Encounters:  02/24/21 77  12/31/20 80  12/26/20 96    Wt Readings from Last 3 Encounters:  02/24/21 140 lb 6.4 oz (63.7 kg)  12/31/20 139 lb 6.4 oz (63.2 kg)  12/25/20 141 lb 12.1 oz (64.3 kg)     Kidney Function Lab Results  Component Value Date/Time   CREATININE 1.31 (H) 03/17/2021 08:08 AM   CREATININE 2.03 (H) 02/24/2021 08:59 AM   GFRNONAA 42 (L) 12/26/2020 04:25 AM   GFRAA 45 (L) 07/30/2020 03:40 PM    BMP Latest Ref Rng & Units 03/17/2021 02/24/2021 12/26/2020  Glucose 65 - 99 mg/dL 91 99 88  BUN 8 - 27 mg/dL 17 38(H) 17  Creatinine 0.57 - 1.00 mg/dL 1.31(H) 2.03(H) 1.38(H)  BUN/Creat Ratio 12 - 28 13 19  -  Sodium 134 - 144 mmol/L 141 141 139  Potassium 3.5 - 5.2 mmol/L 4.1 5.0 3.8  Chloride 96 - 106  mmol/L 105 103 103  CO2 20 - 29 mmol/L 21 19(L) 25  Calcium 8.7 - 10.3 mg/dL 9.4 9.7 9.3    . Current antihypertensive regimen:  o Hydrochlorothiazide 25 mg take 1 tablet daily o Losartan 100 mg Take 1 tablet daily  . How often are you checking your Blood Pressure? daily   . Current home BP readings: 120/84  . What recent interventions/DTPs have been made by any provider to improve Blood Pressure control since last CPP Visit: Patient started taking melatonin 10 mg. She states her neurologist informed her to stop Trazodone due to her having to take keppra.   . Any recent  hospitalizations or ED visits since last visit with CPP? No   . What diet changes have been made to improve Blood Pressure Control?  o Patient states she drinks a lot of water and avoids salt.  . What exercise is being done to improve your Blood Pressure Control?  o Patient states she goes to the gym on weekends.   Adherence Review: Is the patient currently on ACE/ARB medication? Yes Does the patient have >5 day gap between last estimated fill dates? No    Star Rating Drugs: Atorvastatin 20 mg Last filled:02/27/2021 90 DS Losartan 100 mg Last filled:02/22/2021 90DS   [facility] Clinical Pharmacist Assistant [phone]

## 2021-04-25 ENCOUNTER — Ambulatory Visit (INDEPENDENT_AMBULATORY_CARE_PROVIDER_SITE_OTHER): Payer: Medicare HMO | Admitting: Licensed Clinical Social Worker

## 2021-04-25 DIAGNOSIS — R569 Unspecified convulsions: Secondary | ICD-10-CM

## 2021-04-25 DIAGNOSIS — I10 Essential (primary) hypertension: Secondary | ICD-10-CM | POA: Diagnosis not present

## 2021-04-25 DIAGNOSIS — F419 Anxiety disorder, unspecified: Secondary | ICD-10-CM

## 2021-04-25 DIAGNOSIS — Z7189 Other specified counseling: Secondary | ICD-10-CM

## 2021-04-28 NOTE — Patient Instructions (Signed)
Visit Information  Goals Addressed            This Visit's Progress   . SW-Track and Manage My Symptoms-Depression   On track    Timeframe:  Long-Range Goal Priority:  Medium Start Date: 02/10/21                            Expected End Date:  08/20/21                     Follow Up Date- 06/30/21   Patient Self Activities/Goals: . Continue utilizing healthy coping skills discussed . Follow up with PCP regarding request for assistance with hair loss and scheduled appointments . Practice positive self-talk to promote positive mood       Patient verbalizes understanding of instructions provided today and agrees to view in [place].   Telephone follow up appointment with care management team member scheduled for:06/30/21  Christa See, MSW, [email or web address] Phone [phone] 12:59 PM

## 2021-04-28 NOTE — Chronic Care Management (AMB) (Signed)
Chronic Care Management    Clinical Social Work Note  04/28/2021 Name: Kelly Poole MRN: [id number] DOB: November 03, 1951  Kelly Poole is a 70 y.o. year old female who is a primary care patient of Cannady, Barbaraann Faster, NP. The CCM team was consulted to assist the patient with chronic disease management and/or care coordination needs related to: Mental Health Counseling and Resources.   Engaged with patient by telephone for follow up visit in response to provider referral for social work chronic care management and care coordination services.   Consent to Services:  The patient was given information about Chronic Care Management services, agreed to services, and gave verbal consent prior to initiation of services.  Please see initial visit note for detailed documentation.   Patient agreed to services and consent obtained.   Assessment: Patient is engaged in conversation, continues to maintain positive progress with care plan goals. She reports management of depression and anxiety symptoms with healthy coping skills and strong support system. Pt is concerned about hair thinning at the crown of head. She is unsure if it is a result of current medications and would like information on meds/vitamins that can assist. See Care Plan below for interventions and patient self-care actives. Recent life changes [name]: Hair thinning Recommendation: Patient may benefit from, and is in agreement to continue utilizing healthy coping skills.  Follow up Plan: Patient would like continued follow-up.  CCM LCSW will follow up with patient on 06/30/21. Patient will call office if needed prior to next encounter.   SDOH (Social Determinants of Health) assessments and interventions performed:    Advanced Directives Status: Not addressed in this encounter.  CCM Care Plan  Allergies  Allergen Reactions  . Macrobid [Nitrofurantoin Macrocrystal] Other (See Comments)    Sore throat and irritation to throat     Outpatient Encounter Medications as of 04/25/2021  Medication Sig Note  . Ascorbic Acid (VITAMIN C) 1000 MG tablet Take 1,000 mg by mouth daily.   Marland Kitchen aspirin 81 MG tablet Take 81 mg by mouth daily.   Marland Kitchen atorvastatin (LIPITOR) 20 MG tablet Take 1 tablet (20 mg total) by mouth daily.   . Cholecalciferol (VITAMIN D-3) 125 MCG (5000 UT) TABS Take 1 tablet by mouth daily. Takes every other day   . ferrous sulfate 325 (65 FE) MG tablet Take 1 tablet (325 mg total) by mouth daily with breakfast.   . fluticasone (FLONASE) 50 MCG/ACT nasal spray Place 2 sprays into both nostrils daily for 14 days.   . Garlic 6578 MG CAPS Take 1,000 mg by mouth daily. (Patient not taking: Reported on 02/24/2021) 10/12/2019: Doesn't take regular  . hydrochlorothiazide (HYDRODIURIL) 25 MG tablet Take 1 tablet (25 mg total) by mouth daily. (Patient not taking: Reported on 04/01/2021)   . levETIRAcetam (KEPPRA) 500 MG tablet Take 1 tablet (500 mg total) by mouth 2 (two) times daily. New seizure medication.   Marland Kitchen losartan (COZAAR) 100 MG tablet Take 1 tablet (100 mg total) by mouth daily.   . Multiple Vitamin (MULTIVITAMINS PO) Take by mouth daily.   Marland Kitchen omeprazole (PRILOSEC) 20 MG capsule Take 1 capsule (20 mg total) by mouth daily.   . vitamin E 100 UNIT capsule Take 100 Units by mouth daily.    No facility-administered encounter medications on file as of 04/25/2021.    Patient Active Problem List   Diagnosis Date Noted  . Secondary hyperparathyroidism ([place]) 12/23/2020  . Seizure ([place]) 12/23/2020  . Anemia 07/30/2020  . Osteoarthritis of  right knee 09/07/2019  . Vasomotor symptoms due to menopause 03/24/2019  . CKD (chronic kidney disease) stage 3, GFR 30-59 ml/min (HCC) 10/05/2016  . Vitamin B12 deficiency (dietary) anemia 12/27/2015  . Hypertension 12/27/2015  . Insomnia 06/03/2015  . Thyroid goiter 06/03/2015  . HX: breast cancer 06/03/2015  . Gastroesophageal reflux disease 06/03/2015  . Hyperlipidemia 06/03/2015     Conditions to be addressed/monitored: Anxiety and Depression; Mental Health Concerns   Care Plan : General Social Work (Adult)  Updates made by Rebekah Chesterfield, LCSW since 04/28/2021 12:00 AM    Problem: Quality of Life (General Plan of Care)     Long-Range Goal: Quality of Life Maintained   Start Date: 02/10/2021  This Visit's Progress: On track  Priority: Medium  Note:   Timeframe:  Long-Range Goal Priority:  Medium Start Date: 02/10/21                            Expected End Date:  08/20/21                     Follow Up Date- 06/30/21   Current barriers:    Patient is having depressive symptoms due to losing independence as she is unable to drive right now   Unable to consistently perform activities of daily living and needs additional assistance / support in order to meet this unmet need  Unable to  independently self manage needs related to chronic health conditions.   Knowledge deficits related to short term plan for care coordination needs and long term plans for chronic disease management needs  Clinical Goals: Over the next 120 days, patient will work with SW to address concerns related to stress management, increasing self care and coping with being unable to drive at this time Interventions : . Assessed needs, level of care concerns, and barriers to care . Patient reports that she has been managing symptoms of depression and anxiety well. Due to minimal stress, pt has began exercising on a routine basis and caring for mother during the week . Patient is looking forward to clearance to drive independently in July 2022. She receives strong support from adult son and brother to assist with transportation needs . Patient denies need for any resources at this time . Collaboration with PCP-CCM LCSW messaged PCP, per pt request about hair thinning at the crown of head. Pt reports that she is unsure if it is a result of current medications and would like information on  meds/vitamins that can assist . CCM LCSW reviewed healthy self-care tools to promote health and well-being . Discussed plans with patient for ongoing care management follow up and provided patient with direct contact information for care management team . Other interventions provided: Solution-Focused Strategies and Emotional/Supportive Counseling . Collaboration with PCP regarding development and update of comprehensive plan of care as evidenced by provider attestation and co-signature . Inter-disciplinary care team collaboration (see longitudinal plan of care) Patient Self Activities/Goals: . Continue utilizing healthy coping skills discussed . Follow up with PCP regarding request for assistance with hair loss and scheduled appointments . Practice positive self-talk to promote positive mood       Christa See, MSW, [email or web address] Phone [phone] 12:57 PM

## 2021-05-14 ENCOUNTER — Telehealth: Payer: Self-pay

## 2021-05-14 NOTE — Telephone Encounter (Signed)
Called pt advised of Jolene's message. Pt verbalized understanding

## 2021-05-14 NOTE — Telephone Encounter (Signed)
Copied from [place] [id number]. Topic: General - Inquiry >> May 13, 2021 11:58 AM Loma [place] wrote: Reason for CRM: pt was wanting a CB regarding Kelly [facility]'s nurse seeing if it was possible for them to write her a script for a stool softener. She is able to t is having some difficulty sometimes. Wants fu at [phone]

## 2021-05-14 NOTE — Telephone Encounter (Signed)
I would recommend she start out with over the counter Miralax daily and Senna S one tablet twice a day.  If ongoing issues with constipation then recommend visit for this please.

## 2021-06-09 ENCOUNTER — Telehealth: Payer: Self-pay

## 2021-06-09 NOTE — Telephone Encounter (Signed)
Copied from [place] [id number]. Topic: General - Inquiry >> Jun 09, 2021  3:32 PM Holley Dexter N wrote: Reason for CRM: Pt wanted to ask PCP is she could get back on the Viatmin B12, pt states if she could take maybe once a week or every other day. Pt states she is feeling tired more. Please advise  Would pt need apt for this or lab apt?

## 2021-06-09 NOTE — Telephone Encounter (Signed)
Please advise 

## 2021-06-10 NOTE — Telephone Encounter (Signed)
Please get patient scheduled for a follow up

## 2021-06-11 ENCOUNTER — Ambulatory Visit (INDEPENDENT_AMBULATORY_CARE_PROVIDER_SITE_OTHER): Payer: Medicare HMO | Admitting: Pharmacist

## 2021-06-11 DIAGNOSIS — I10 Essential (primary) hypertension: Secondary | ICD-10-CM | POA: Diagnosis not present

## 2021-06-11 DIAGNOSIS — N1832 Chronic kidney disease, stage 3b: Secondary | ICD-10-CM

## 2021-06-11 DIAGNOSIS — N183 Chronic kidney disease, stage 3 unspecified: Secondary | ICD-10-CM | POA: Diagnosis not present

## 2021-06-11 NOTE — Telephone Encounter (Signed)
Scheduled 7/18 per pt due to needing a Monday morning

## 2021-06-11 NOTE — Telephone Encounter (Signed)
Noted  

## 2021-06-11 NOTE — Patient Instructions (Signed)
Visit Information  It was a pleasure speaking with you today. Thank you for letting me be part of your clinical team. Please call with any questions or concerns.    Goals Addressed               This Visit's Progress     COMPLETED: "I want to take care of my kidneys" (pt-stated)        CARE PLAN ENTRY (see longtitudinal plan of care for additional care plan information)  Current Barriers:  Polypharmacy; complex patient with multiple comorbidities including HTN, OA, HLD, insomnia, hx breast cancer Has questions regarding recent lab results, what they mean, and what her medications ar efor Most recent eGFR: ~42 mL/min HTN: losartan 100 mg daily, HCTZ 12.5 mg daily, home BP 120/70s, though notes she has a wrist BP cuff. BP reading in office ~3 weeks ago was consistent ASCVD risk reduction: atorvastatin 10 mg daily; last LDL not quite at goal <100; ASA 81 mg daily Insomnia: trazodone 100 mg QPM OA: taking turmeric daily GERD: omeprazole 20 mg daily  Supplements: MVI, vitamin C, vitamin B12, vitamin E, ferrous sulfate daily; garlic occasionally Allergies: cetirizine PRN Exercise: going to the gym 3 days per week, does treadmill and ab work; hasn't been going often lately because of work schedule  Pharmacist Clinical Goal(s):  Over the next 90 days, patient will work with PharmD and provider towards optimized medication management  Interventions: Comprehensive medication review performed; medication list updated in electronic medical record Inter-disciplinary care team collaboration (see longitudinal plan of care) Reviewed recent lab results in depth and compared to last BMP. Reviewed that since patient was NOT fasting, we are not concerned w/ glucose level. Reviewed goal BP <130/80 if tolerated to reduce risk of renal decline. Encouraged her to continue regimen w/ losartan 100 mg daily and HCTZ 12.5 mg daily. Encouraged to bring home BP cuf to the office at next visit to compare for  accuracy Encouraged adherence to atorvastatin. Could consider dose increase if LDL remains >100. Fill hx up to date Reviewed Natural Meds. No issues w/ turmeric and renal function. Patient reports significant benefit with this, since meloxicam was stopped d/t CKD   Patient Self Care Activities:  Patient will take medications as prescribed  Initial goal documentation        COMPLETED: converted        CARE PLAN ENTRY (see longitudinal plan of care for additional care plan information)  Current Barriers:  Chronic Disease Management support, education, and care coordination needs related to Hypertension, Hyperlipidemia, GERD, Chronic Kidney Disease, Osteoarthritis, and Anemia and B12 deficiniency   Hypertension/CKD IIIB BP Readings from Last 3 Encounters:  12/31/20 129/81  12/26/20 (!) 148/79  12/10/20 (!) 158/92  Pharmacist Clinical Goal(s): Over the next 90 days, patient will work with PharmD and providers to achieve BP goal <130/80 Current regimen:  Losartan 100 mg qd HCTZ 25  Mg qd  Interventions: Reviewed proper BP technique including  sitting down for at least 5 minutes before, resting calmly, with your feet flat on the floor.    Provided diet and exercise counseling. Reviewed fill history and medication management strategy.  Patient self care activities - Over the next 90 days, patient will: Check BP 5 times weekly, document, and provide at future appointments Ensure daily salt intake < 2300 mg/day Contact PharmD or PCP if unable to obtain RX  Hyperlipidemia Lab Results  Component Value Date/Time   LDLCALC 114 (H) 07/30/2020 03:40 PM  Pharmacist  Clinical Goal(s): Over the next 90 days, patient will work with PharmD and providers to achieve LDL goal < 100 Current regimen:  Atorvastatin 10 mg qd Aspirin 81 mg qd Interventions: Provided diet and exercise counseling. Discussed goal LDL and potential increase of statin dose Patient self care activities - Over the next  90 days, patient will: Cut back on fried chicken intake and avoid eating the skin. Continue exercise regimen incorporating weight training  New onset seizure-like activity Pharmacist Clinical Goal(s) Over the next 60 days, patient will work with PharmD and providers to optimize therapy Current regimen:  Keppra 500 mg bid  Interventions: Reviewed  Ae's of Keppra and discussed MOA Reviewed Neuro appt follow up Will collaborate with LCSW/RNCM for community resource  assistance with transportation a food. (patient can't drive for 6 months) Patient self care activities - Over the next 60 days, patient will: Attend all scheduled visits Take medication as prescribed   Medication management Pharmacist Clinical Goal(s): Over the next 90  days, patient will work with PharmD and providers to achieve optimal medication adherence Current pharmacy: [facility] Interventions Comprehensive medication review performed. Continue current medication management strategy Patient self care activities - Over the next 90 days, patient will: Focus on medication adherence by fill dates Take medications as prescribed Report any questions or concerns to PharmD and/or provider(s)  Please see past updates related to this goal by clicking on the "Past Updates" button in the selected goal         PharmD Manage my diet        Timeframe:  Long-Range Goal Priority:  High Start Date:                             Expected End Date:                       Follow Up Date 3 months   - ask for help if I have trouble affording healthy foods - choose foods low in fat and sugar - choose foods that are low in sodium (salt) - eat 3 to 5 servings of fruits and vegetables each day - prepare or eat main meal at home 3 to 5 days each week - keep healthy snacks on hand - read food labels for sodium (salt), fat and sugar content - take my supplements as prescribed - watch for swelling in feet, ankles and  legs every day    Why is this important?   A healthy diet is important for mental and physical health.  Healthy food helps repair damaged body tissue and maintains strong bones and muscles.  No single food is just right so eating a variety of proteins, fruits, vegetables and grains is best.  You may need to change what you eat or drink to manage kidney disease.  A dietitian is the best person to guide you.     Notes:        PharmD- Track and Manage My Blood Pressure-Hypertension        Timeframe:  Long-Range Goal Priority:  Medium Start Date:                             Expected End Date:                       Follow Up Date 3 months   -  check blood pressure daily - write blood pressure results in a log or diary    Why is this important?   You won't feel high blood pressure, but it can still hurt your blood vessels.  High blood pressure can cause heart or kidney problems. It can also cause a stroke.  Making lifestyle changes like losing a little weight or eating less salt will help.  Checking your blood pressure at home and at different times of the day can help to control blood pressure.  If the doctor prescribes medicine remember to take it the way the doctor ordered.  Call the office if you cannot afford the medicine or if there are questions about it.     Notes:          Patient verbalizes understanding of instructions provided today and agrees to view in [place].   Telephone follow up appointment with pharmacy team member scheduled for: 3 months   Junita Push. Kenton Kingfisher PharmD, [place] Clinical Pharmacist [phone]

## 2021-06-11 NOTE — Progress Notes (Signed)
Chronic Care Management Pharmacy Note  06/11/2021 Name:  Kelly Poole MRN:  [id number] DOB:  March 25, 1951  Subjective: Kelly EMONEE Poole is an 70 y.o. year old female who is a primary patient of Cannady, Barbaraann Faster, NP.  The CCM team was consulted for assistance with disease management and care coordination needs.    Engaged with patient by telephone for follow up visit in response to provider referral for pharmacy case management and/or care coordination services.   Consent to Services:  The patient was given information about Chronic Care Management services, agreed to services, and gave verbal consent prior to initiation of services.  Please see initial visit note for detailed documentation.   Patient Care Team: Venita Lick, NP as PCP - General (Nurse Practitioner) Vanita Ingles, RN as Case Manager (General Practice) Vladimir Faster, [facility] (Pharmacist) Rebekah Chesterfield, LCSW as Social Worker (Licensed Clinical Social Worker)  Recent office visits: 03/17/21- lab visit- SCR improved at 1.31 eGFR 45, PTH 81, Ur microabl 80, microalb/creat ratio 30-300 02/24/21- Cannady(PCP)- blood work, BP 100/68, GFR decrease--needs to f/u with nephro, stop hctz, f/u labs in one week, f/u with hematoology for anemia, increase atorvastatin to 20 mg  Recent consult visits:  03/13/21- Shah(Neuro)- continue Keppra 500 mg bid, rt front cortical hyperintensity--need to r/o glioma, rpt MRI showed encephalomalacia, needs very aggressive BP mgmnt-- in office BP 178/87, no driving 6 mos from last seizure  Hospital visits: 12/23/20- New  Onset seizure--  Objective:  Lab Results  Component Value Date   CREATININE 1.31 (H) 03/17/2021   BUN 17 03/17/2021   GFRNONAA 42 (L) 12/26/2020   GFRAA 45 (L) 07/30/2020   NA 141 03/17/2021   K 4.1 03/17/2021   CALCIUM 9.4 03/17/2021   CO2 21 03/17/2021   GLUCOSE 91 03/17/2021    Lab Results  Component Value Date/Time   MICROALBUR 80 (H) 03/17/2021 08:05 AM     Last diabetic Eye exam: No results found for: HMDIABEYEEXA  Last diabetic Foot exam: No results found for: HMDIABFOOTEX   Lab Results  Component Value Date   CHOL 202 (H) 02/24/2021   HDL 51 02/24/2021   LDLCALC 136 (H) 02/24/2021   TRIG 85 02/24/2021   CHOLHDL 2.8 08/23/2018    Hepatic Function Latest Ref Rng & Units 02/24/2021 12/24/2020 12/23/2020  Total Protein 6.0 - 8.5 g/dL 7.6 7.1 7.9  Albumin 3.8 - 4.8 g/dL 4.6 3.6 4.0  AST 0 - 40 IU/L 26 42(H) 56(H)  ALT 0 - 32 IU/L 22 42 56(H)  Alk Phosphatase 44 - 121 IU/L 88 78 87  Total Bilirubin 0.0 - 1.2 mg/dL 0.3 1.0 0.6    Lab Results  Component Value Date/Time   TSH 2.850 02/24/2021 08:59 AM   TSH 1.600 02/16/2020 04:24 PM    CBC Latest Ref Rng & Units 02/24/2021 12/26/2020 12/25/2020  WBC 3.4 - 10.8 x10E3/uL 6.2 10.7(H) 9.2  Hemoglobin 11.1 - 15.9 g/dL 9.3(L) 8.9(L) 9.0(L)  Hematocrit 34.0 - 46.6 % 29.5(L) 27.1(L) 26.5(L)  Platelets 150 - 450 x10E3/uL 292 271 281    Lab Results  Component Value Date/Time   VD25OH 63.3 02/16/2020 04:24 PM    Clinical ASCVD: No  The 10-year ASCVD risk score Mikey Bussing DC Jr., et al., 2013) is: 20.5%   Values used to calculate the score:     Age: 66 years     Sex: Female     Is Non-Hispanic African American: Yes     Diabetic: No  Tobacco smoker: No     Systolic Blood Pressure: 174 mmHg     Is BP treated: Yes     HDL Cholesterol: 51 mg/dL     Total Cholesterol: 202 mg/dL    Depression screen [facility] 2/9 12/31/2020 10/14/2020 04/19/2020  Decreased Interest 0 0 0  Down, Depressed, Hopeless 0 0 0  PHQ - 2 Score 0 0 0  Altered sleeping 0 - -  Tired, decreased energy 0 - -  Change in appetite 0 - -  Feeling bad or failure about yourself  0 - -  Trouble concentrating 0 - -  Moving slowly or fidgety/restless 0 - -  Suicidal thoughts 0 - -  PHQ-9 Score 0 - -      Social History   Tobacco Use  Smoking Status Never  Smokeless Tobacco Never   BP Readings from Last 3 Encounters:  02/24/21  100/68  02/07/21 109/80  12/31/20 129/81   Pulse Readings from Last 3 Encounters:  02/24/21 77  12/31/20 80  12/26/20 96   Wt Readings from Last 3 Encounters:  02/24/21 140 lb 6.4 oz (63.7 kg)  12/31/20 139 lb 6.4 oz (63.2 kg)  12/25/20 141 lb 12.1 oz (64.3 kg)   BMI Readings from Last 3 Encounters:  02/24/21 30.38 kg/m  12/31/20 30.17 kg/m  12/25/20 31.78 kg/m    Assessment/Interventions: Review of patient past medical history, allergies, medications, health status, including review of consultants reports, laboratory and other test data, was performed as part of comprehensive evaluation and provision of chronic care management services.   SDOH:  (Social Determinants of Health) assessments and interventions performed: No  SDOH Screenings   Alcohol Screen: Not on file  Depression (PHQ2-9): Low Risk    PHQ-2 Score: 0  Financial Resource Strain: Medium Risk   Difficulty of Paying Living Expenses: Somewhat hard  Food Insecurity: No Food Insecurity   Worried About Charity fundraiser in the Last Year: Never true   Ran Out of Food in the Last Year: Never true  Housing: Not on file  Physical Activity: Sufficiently Active   Days of Exercise per Week: 3 days   Minutes of Exercise per Session: 90 min  Social Connections: Not on file  Stress: No Stress Concern Present   Feeling of Stress : Not at all  Tobacco Use: Low Risk    Smoking Tobacco Use: Never   Smokeless Tobacco Use: Never  Transportation Needs: No Transportation Needs   Lack of Transportation (Medical): No   Lack of Transportation (Non-Medical): No      Immunization History  Administered Date(s) Administered   Moderna Sars-Covid-2 Vaccination 02/12/2020, 03/12/2020, 11/30/2020, 12/08/2020   Td 12/22/2003    Conditions to be addressed/monitored:  Hypertension, Hyperlipidemia, GERD, Chronic Kidney Disease, Osteoarthritis and anemia and seizure  There are no care plans that you recently modified to display  for this patient.    Medication Assistance: None required.  Patient affirms current coverage meets needs.  Patient's preferred pharmacy is:  [facility] [address] ([address]), [place]) [place] [address] Phone: [phone] Fax: [phone]  [facility] - [place], [place] [address] Phone: [phone] Fax: [phone]  Uses pill box? Yes Pt endorses 99% compliance  We discussed: Benefits of medication synchronization, packaging and delivery as well as enhanced pharmacist oversight with [facility]. Patient decided to: Continue current  medication management strategy  Care Plan and Follow Up Patient Decision:  Patient agrees to Care Plan and Follow-up.  Plan: Telephone follow up appointment with care management team member scheduled for:  3 months  Junita Push. Kenton Kingfisher PharmD, [place] [facility] [phone]

## 2021-06-18 ENCOUNTER — Ambulatory Visit: Payer: Self-pay

## 2021-06-18 ENCOUNTER — Telehealth: Payer: Medicare HMO | Admitting: General Practice

## 2021-06-18 DIAGNOSIS — I10 Essential (primary) hypertension: Secondary | ICD-10-CM

## 2021-06-18 DIAGNOSIS — N1832 Chronic kidney disease, stage 3b: Secondary | ICD-10-CM

## 2021-06-18 DIAGNOSIS — R569 Unspecified convulsions: Secondary | ICD-10-CM

## 2021-06-18 NOTE — Patient Instructions (Signed)
Visit Information  PATIENT GOALS:  Goals Addressed             This Visit's Progress    PharmD- Track and Manage My Blood Pressure-Hypertension       Timeframe:  Long-Range Goal Priority:  Medium Start Date:                             Expected End Date:                       Follow Up Date 3 months   - check blood pressure daily - write blood pressure results in a log or diary    Why is this important?   You won't feel high blood pressure, but it can still hurt your blood vessels.  High blood pressure can cause heart or kidney problems. It can also cause a stroke.  Making lifestyle changes like losing a little weight or eating less salt will help.  Checking your blood pressure at home and at different times of the day can help to control blood pressure.  If the doctor prescribes medicine remember to take it the way the doctor ordered.  Call the office if you cannot afford the medicine or if there are questions about it.     Notes: 06-18-2021: Patient continues to monitor blood pressure at home with recent reading of 113/82.         Patient verbalizes understanding of instructions provided today and agrees to view in [place].   Telephone follow up appointment with care management team member scheduled for: 09-17-2021 at [place] RN, [facility] Mobile: [phone]

## 2021-06-18 NOTE — Chronic Care Management (AMB) (Signed)
Chronic Care Management   CCM RN Visit Note  06/18/2021 Name: Kelly Poole MRN: [id number] DOB: 03-22-1951  Subjective: Kelly Poole is a 70 y.o. year old female who is a primary care patient of Cannady, Barbaraann Faster, NP. The care management team was consulted for assistance with disease management and care coordination needs.    Engaged with patient by telephone for follow up visit in response to provider referral for case management and/or care coordination services.   Consent to Services:  The patient was given information about Chronic Care Management services, agreed to services, and gave verbal consent prior to initiation of services.  Please see initial visit note for detailed documentation.   Patient agreed to services and verbal consent obtained.   Assessment: Review of patient past medical history, allergies, medications, health status, including review of consultants reports, laboratory and other test data, was performed as part of comprehensive evaluation and provision of chronic care management services.   SDOH (Social Determinants of Health) assessments and interventions performed:    CCM Care Plan  Allergies  Allergen Reactions   Macrobid [Nitrofurantoin Macrocrystal] Other (See Comments)    Sore throat and irritation to throat    Outpatient Encounter Medications as of 06/18/2021  Medication Sig Note   Ascorbic Acid (VITAMIN C) 1000 MG tablet Take 1,000 mg by mouth daily.    aspirin 81 MG tablet Take 81 mg by mouth daily.    atorvastatin (LIPITOR) 20 MG tablet Take 1 tablet (20 mg total) by mouth daily.    Cholecalciferol 50 MCG (2000 UT) TABS Take 1 tablet by mouth daily.    ferrous sulfate 325 (65 FE) MG tablet Take 1 tablet (325 mg total) by mouth daily with breakfast.    fluticasone (FLONASE) 50 MCG/ACT nasal spray Place 2 sprays into both nostrils daily for 14 days.    Garlic 5643 MG CAPS Take 1,000 mg by mouth daily. 10/12/2019: Doesn't take regular    hydrochlorothiazide (HYDRODIURIL) 25 MG tablet Take 1 tablet (25 mg total) by mouth daily. (Patient not taking: No sig reported)    levETIRAcetam (KEPPRA) 500 MG tablet Take 1 tablet (500 mg total) by mouth 2 (two) times daily. New seizure medication.    loratadine (CLARITIN) 10 MG tablet Take 10 mg by mouth daily.    losartan (COZAAR) 100 MG tablet Take 1 tablet (100 mg total) by mouth daily.    Multiple Vitamin (MULTIVITAMINS PO) Take by mouth daily.    omeprazole (PRILOSEC) 20 MG capsule Take 1 capsule (20 mg total) by mouth daily.    sennosides-docusate sodium (SENOKOT-S) 8.6-50 MG tablet Take 1 tablet by mouth daily.    vitamin E 100 UNIT capsule Take 100 Units by mouth daily.    No facility-administered encounter medications on file as of 06/18/2021.    Patient Active Problem List   Diagnosis Date Noted   Secondary hyperparathyroidism ([place]) 12/23/2020   Seizure ([place]) 12/23/2020   Anemia 07/30/2020   Osteoarthritis of right knee 09/07/2019   Vasomotor symptoms due to menopause 03/24/2019   CKD (chronic kidney disease) stage 3, GFR 30-59 ml/min ([facility]) 10/05/2016   Vitamin B12 deficiency (dietary) anemia 12/27/2015   Hypertension 12/27/2015   Insomnia 06/03/2015   Thyroid goiter 06/03/2015   HX: breast cancer 06/03/2015   Gastroesophageal reflux disease 06/03/2015   Hyperlipidemia 06/03/2015    Conditions to be addressed/monitored:HTN, CKD Stage 3, and seizures  Care Plan : RNCM: Chronic Kidney (Adult)  Updates made by Inge Rise, RN  since 06/18/2021 12:00 AM     Problem: RNCM: Adjustment to Chronic Kidney Disease   Priority: Medium     Long-Range Goal: RNCM: CKD3 Management   Expected End Date: 12/18/2021  This Visit's Progress: On track  Priority: Medium  Note:   Current Barriers:  Knowledge Deficits related to resources in the community for management of CKD3 and chronic conditions  Chronic Disease Management support and education needs related to effective  management of CKD3 Lacks caregiver support.  Non-adherence to prescribed medication regimen Does not adhere to prescribed medication regimen Lacks social connections Does not contact provider office for questions/concerns  Nurse Case Manager Clinical Goal(s):  patient will verbalize understanding of plan for effective management of CKD3 patient will work with [facility] and pcp  to address needs related to CKD3 patient will attend all scheduled medical appointments: 07-07-2021 regarding B12 concerns and 09-08-2021 for physical with pcp patient will demonstrate improved health management independence as evidenced bylabs stable with no decline, adherence to dietary restrictions, adherence to medications, and working with the CCM team to optimize health and well being   Interventions:  1:1 collaboration with Venita Lick, NP regarding development and update of comprehensive plan of care as evidenced by provider attestation and co-signature Inter-disciplinary care team collaboration (see longitudinal plan of care) Evaluation of current treatment plan related to CKD3 and patient's adherence to plan as established by provider. 03-28-2021: The patient saw the pcp on 3-7 and had blood work. Hydrochlorothiazide discontinued after lab work. The patient continues to see kidney specialist on a regular basis. The patient is following a renal diet and monitoring her food content. Will continue to monitor. 06-18-2021: Patient continues to be managing well with established CKD plan. Advised patient to call the office for questions or concerns Provided education to patient re: following heart healthy renal diet. 03-28-2021: Is eating well. Denies any issues with dietary restrictions. 06-18-2021: Patient eating well and states she eats a lot of fruits and vegetables. Reviewed medications with patient and discussed compliance. The patient states she has her medications and is taking as prescribed.  06-18-2021:  Patient  continues to be compliant with all her medications. Reviewed scheduled/upcoming provider appointments including: 07-07-2021 regarding B12 concerns and 09-08-2021 for physical with pcp. Discussed plans with patient for ongoing care management follow up and provided patient with direct contact information for care management team  Patient Goals/Self-Care Activities Over the next 120 days, patient will:  - Patient will self administer medications as prescribed Patient will attend all scheduled provider appointments Patient will call pharmacy for medication refills Patient will attend church or other social activities Patient will continue to perform ADL's independently Patient will continue to perform IADL's independently Patient will call provider office for new concerns or questions Patient will work with BSW to address care coordination needs and will continue to work with the clinical team to address health care and disease management related needs.   - decision-making supported - depression screen reviewed - goal setting facilitated - positive reinforcement provided - problem-solving facilitated - relaxation techniques promoted - self-care encouraged - self-reflection promoted - verbalization of feelings encouraged  Follow Up Plan: Telephone follow up appointment with care management team member scheduled for: 09-17-2021 at 0900 am        Care Plan : RNCM: Hypertension (Adult)  Updates made by Inge Rise, RN since 06/18/2021 12:00 AM     Problem: RNCM: Hypertension (Hypertension)   Priority: Medium     Long-Range Goal: RNCM:  Hypertension Monitored   Expected End Date: 12/18/2021  This Visit's Progress: On track  Priority: Medium  Note:   Objective:  Last practice recorded BP readings:  BP Readings from Last 3 Encounters:  02/24/21 100/68  02/07/21 109/80  12/31/20 129/81     Most recent eGFR/CrCl: No results found for: EGFR  No components found for:  CRCL Current Barriers:  Knowledge Deficits related to basic understanding of hypertension pathophysiology and self care management Knowledge Deficits related to understanding of medications prescribed for management of hypertension Non-adherence to prescribed medication regimen- was without her medications for HTN in January x 1 week  Limited Social Support Unable to self administer medications as prescribed Does not adhere to prescribed medication regimen Lacks social connections  Case Manager Clinical Goal(s):  Over the next 120 days, patient will verbalize understanding of plan for hypertension management Over the next 120 days, patient will attend all scheduled medical appointments: 07-07-2021 regarding B12 concerns and 09-08-2021 for physical with pcp. Over the next 120 days, patient will demonstrate improved adherence to prescribed treatment plan for hypertension as evidenced by taking all medications as prescribed, monitoring and recording blood pressure as directed, adhering to low sodium/DASH diet Over the next 120 days, patient will demonstrate improved health management independence as evidenced by checking blood pressure as directed and notifying PCP if SBP>160 or DBP > 90, taking all medications as prescribe, and adhering to a low sodium diet as discussed. Over the next 120 days, patient will verbalize basic understanding of hypertension disease process and self health management plan as evidenced by compliance with medications, dietary restrictions, and working with the CCM team to optimize health and well being  Interventions:  Collaboration with Venita Lick, NP regarding development and update of comprehensive plan of care as evidenced by provider attestation and co-signature Inter-disciplinary care team collaboration (see longitudinal plan of care) Evaluation of current treatment plan related to hypertension self management and patient's adherence to plan as established by  provider. 03-28-2021: Saw the provider recently and got a good report. She was taken off of HCTZ and is doing well. States blood pressure is up and down depending on her activity level but has not reached 712 systolic. Review of normal parameters. 06-18-2021:  Patient monitoring blood pressure at home with recent reading 113/82. Provided education to patient re: stroke prevention, s/s of heart attack and stroke, DASH diet, complications of uncontrolled blood pressure Reviewed medications with patient and discussed importance of compliance.  03-28-2021: The patient reports compliance with medications. 06-18-2021: Patient continues to be compliant with all medications. Discussed plans with patient for ongoing care management follow up and provided patient with direct contact information for care management team Advised patient, providing education and rationale, to monitor blood pressure daily and record, calling PCP for findings outside established parameters.  Reviewed scheduled/upcoming provider appointments including:  07-07-2021 regarding B12 concerns and 09-08-2021 for physical with pcp. Patient Goals/Self-Care Activities Over the next 120 days, patient will:  - Self administers medications as prescribed Attends all scheduled provider appointments Calls provider office for new concerns, questions, or BP outside discussed parameters Checks BP and records as discussed- 03-28-2021: Endorses checking blood pressure regularly  Follows a low sodium diet/DASH diet - blood pressure trends reviewed - depression screen reviewed - home or ambulatory blood pressure monitoring encouraged Follow Up Plan: Telephone follow up appointment with care management team member scheduled for: 09-17-2021 at 0900 am    Care Plan : RNCM: Seizures- new onset  Updates  made by Inge Rise, RN since 06/18/2021 12:00 AM     Problem: RNCM: Health Promotion or Disease Self-Management (General Plan of Care)   Priority: High      Long-Range Goal: RNCM: Seizures management   Expected End Date: 12/18/2021  This Visit's Progress: On track  Priority: High  Note:   Current Barriers:  Knowledge Deficits related to resources available for help with needs post seizure activity  Chronic Disease Management support and education needs related to new onset of witnessed seizure in January 2022 Lacks caregiver support.  Transportation barriers- patient cannot drive at this time, says she has adequate transportation, knows there are resources available if needed for transportation. Lacks social connections Does not contact provider office for questions/concerns  Nurse Case Manager Clinical Goal(s):  patient will verbalize understanding of plan for effective management of seizures  patient will work with RNCM, pcp, and specialist  to address needs related to seizures  patient will attend all scheduled medical appointments: 07-07-2021 regarding B12 concerns and 09-08-2021 for physical with pcp. patient will demonstrate improved adherence to prescribed treatment plan for seizures as evidenced bymedication compliance, no new seizure activity, and working with the CCM team to manage health and well being for chronic conditions  Interventions:  1:1 collaboration with Venita Lick, NP regarding development and update of comprehensive plan of care as evidenced by provider attestation and co-signature Inter-disciplinary care team collaboration (see longitudinal plan of care) Evaluation of current treatment plan related to seizures  and patient's adherence to plan as established by provider. 03-28-2021: The patient is doing well and denies any new seizures. She states the medications she is taking are keeping her appetite good. She does not see the specialist again until September. She is hopeful that she will not have any more seizures so she can go back to driving after July 3.  Repeat MRI shows the spot seen on her brain is gone per  the patient.  Will continue to monitor. 06-18-2021:  Patient reports no further seizures since January 2022. Advised patient to call the office for changes or questions Provided education to patient re: taking medications as ordered, calling when medications are not there, adherence to prescribed diet, monitoring for new onset of seizure activity, driving restrictions. 03-28-2021: Review of driving restrictions, taking medications as prescribed and not missing doses, adequate sleep and rest, pacing activities. The patient has returned to the gym and feels great. Denies any issues with transportation at this time.  Reviewed medications with patient and discussed compliance, the patient is taking Keppra and no issues noted at this time.  03-28-2021: The patient states compliance with medications regimen. 06-18-2021:  Patient remains on Keppra 586m 1 tab BID and is compliant with all medications.  Reviewed scheduled/upcoming provider appointments including: 02-24-2021 at 0820 with pcp, has MRI scheduled for 03-07-2021. Next pcp appointment is 09-08-2021 also will see the specialist in September. 06-18-2021: upcoming PCP appointments are on 07-07-2021 regarding B12 concerns and 09-08-2021 for physical with pcp. Discussed plans with patient for ongoing care management follow up and provided patient with direct contact information for care management team  Patient Goals/Self-Care Activities Over the next 120 days, patient will:  - Patient will self administer medications as prescribed Patient will attend all scheduled provider appointments Patient will call pharmacy for medication refills Patient will attend church or other social activities Patient will continue to perform ADL's independently Patient will continue to perform IADL's independently Patient will call provider office for new concerns  or questions Patient will work with BSW to address care coordination needs and will continue to work with the clinical team  to address health care and disease management related needs.   - barriers to meeting goals identified - change-talk evoked - choices provided - collaboration with team encouraged - decision-making supported - difficulty of making life-long changes acknowledged - health risks reviewed - problem-solving facilitated - questions answered - readiness for change evaluated - reassurance provided - resources needed to meet goals identified. 03-28-2021: The patient did inquire about food resources. The patient has received gift cards x 2 within the last 6 months for food and the [employer]. Explained limitations and the request previously for food resources. The patient verbalized that she would wait to talk to the care guides about food resources that she had what she needed at this time. Will continue to monitor.  - self-reflection promoted - self-reliance encouraged - verbalization of feelings encouraged  Follow Up Plan: Telephone follow up appointment with care management team member scheduled for: 09-17-2021 at 0900 am         Plan:Telephone follow up appointment with care management team member scheduled for:  09-17-2021 at [place] RN, [facility] Mobile: [phone]

## 2021-06-30 ENCOUNTER — Ambulatory Visit (INDEPENDENT_AMBULATORY_CARE_PROVIDER_SITE_OTHER): Payer: Medicare HMO | Admitting: Licensed Clinical Social Worker

## 2021-06-30 DIAGNOSIS — Z733 Stress, not elsewhere classified: Secondary | ICD-10-CM

## 2021-06-30 DIAGNOSIS — R569 Unspecified convulsions: Secondary | ICD-10-CM

## 2021-06-30 DIAGNOSIS — I10 Essential (primary) hypertension: Secondary | ICD-10-CM | POA: Diagnosis not present

## 2021-06-30 DIAGNOSIS — N1832 Chronic kidney disease, stage 3b: Secondary | ICD-10-CM

## 2021-06-30 NOTE — Chronic Care Management (AMB) (Signed)
Chronic Care Management    Clinical Social Work Note  06/30/2021 Name: Kelly Poole MRN: [id number] DOB: 08-May-1951  Kelly Poole is a 70 y.o. year old female who is a primary care patient of Cannady, Kelly Faster, NP. The CCM team was consulted to assist the patient with chronic disease management and/or care coordination needs related to: Mental Health Counseling and Resources and Caregiver Stress.   Engaged with patient by telephone for follow up visit in response to provider referral for social work chronic care management and care coordination services.   Consent to Services:  The patient was given information about Chronic Care Management services, agreed to services, and gave verbal consent prior to initiation of services.  Please see initial visit note for detailed documentation.   Patient agreed to services and consent obtained.   Assessment: Patient is engaged in conversation, continues to maintain positive progress with care plan goals. Patient has been cleared to drive independently which has assisted patient in decreasing stress and promoting positive mood. Strategies to promote healthy boundaries and self-care identified to assist with caregiver stress. See Care Plan below for interventions and patient self-care actives. Recent life changes [name]: Strained relationship with ex and stress management Recommendation: Patient may benefit from, and is in agreement to work with LCSW to address care coordination needs and will continue to work with the clinical team to address health care and disease management related needs.  Follow up Plan: Patient would like continued follow-up from CCM LCSW .  Follow up scheduled in on 09/08/21. Patient will call office if needed prior to next encounter.  SDOH (Social Determinants of Health) assessments and interventions performed:    Advanced Directives Status: Not addressed in this encounter.  CCM Care Plan  Allergies  Allergen  Reactions   Macrobid [Nitrofurantoin Macrocrystal] Other (See Comments)    Sore throat and irritation to throat    Outpatient Encounter Medications as of 06/30/2021  Medication Sig Note   Ascorbic Acid (VITAMIN C) 1000 MG tablet Take 1,000 mg by mouth daily.    aspirin 81 MG tablet Take 81 mg by mouth daily.    atorvastatin (LIPITOR) 20 MG tablet Take 1 tablet (20 mg total) by mouth daily.    Cholecalciferol 50 MCG (2000 UT) TABS Take 1 tablet by mouth daily.    ferrous sulfate 325 (65 FE) MG tablet Take 1 tablet (325 mg total) by mouth daily with breakfast.    fluticasone (FLONASE) 50 MCG/ACT nasal spray Place 2 sprays into both nostrils daily for 14 days.    Garlic 0086 MG CAPS Take 1,000 mg by mouth daily. 10/12/2019: Doesn't take regular   hydrochlorothiazide (HYDRODIURIL) 25 MG tablet Take 1 tablet (25 mg total) by mouth daily. (Patient not taking: No sig reported)    levETIRAcetam (KEPPRA) 500 MG tablet Take 1 tablet (500 mg total) by mouth 2 (two) times daily. New seizure medication.    loratadine (CLARITIN) 10 MG tablet Take 10 mg by mouth daily.    losartan (COZAAR) 100 MG tablet Take 1 tablet (100 mg total) by mouth daily.    Multiple Vitamin (MULTIVITAMINS PO) Take by mouth daily.    omeprazole (PRILOSEC) 20 MG capsule Take 1 capsule (20 mg total) by mouth daily.    sennosides-docusate sodium (SENOKOT-S) 8.6-50 MG tablet Take 1 tablet by mouth daily.    vitamin E 100 UNIT capsule Take 100 Units by mouth daily.    No facility-administered encounter medications on file as of 06/30/2021.  Patient Active Problem List   Diagnosis Date Noted   Secondary hyperparathyroidism ([place]) 12/23/2020   Seizure ([place]) 12/23/2020   Anemia 07/30/2020   Osteoarthritis of right knee 09/07/2019   Vasomotor symptoms due to menopause 03/24/2019   CKD (chronic kidney disease) stage 3, GFR 30-59 ml/min (HCC) 10/05/2016   Vitamin B12 deficiency (dietary) anemia 12/27/2015   Hypertension 12/27/2015    Insomnia 06/03/2015   Thyroid goiter 06/03/2015   HX: breast cancer 06/03/2015   Gastroesophageal reflux disease 06/03/2015   Hyperlipidemia 06/03/2015    Conditions to be addressed/monitored:  Stress  Care Plan : General Social Work (Adult)  Updates made by Rebekah [place], LCSW since 06/30/2021 12:00 AM     Problem: Quality of Life (General Plan of Care)      Long-Range Goal: Quality of Life Maintained   Start Date: 02/10/2021  This Visit's Progress: On track  Recent Progress: On track  Priority: Medium  Note:   Timeframe:  Long-Range Goal Priority:  Medium Start Date: 02/10/21                            Expected End Date:  10/20/21                     Follow Up Date- 09/15/21   Current barriers:   Patient is having depressive symptoms due to losing independence as she is unable to drive right now  Unable to consistently perform activities of daily living and needs additional assistance / support in order to meet this unmet need Unable to  independently self manage needs related to chronic health conditions.  Knowledge deficits related to short term plan for care coordination needs and long term plans for chronic disease management needs  Clinical Goals: Over the next 120 days, patient will work with SW to address concerns related to stress management, increasing self care and coping with being unable to drive at this time Interventions : Assessed needs, level of care concerns, and barriers to care Patient reports that she has been having difficulty managing sore throat and sinus drainage. She does not feel sick; however, reports no decrease in symptoms with drinking hot tea and gurgling with salt water. States her voice is hoarse Patient has an upcoming appt on Wed. Patient is hopeful that she will be prescribed antibiotics to assist with symptom management Patient reports an increase in stress triggered by recent interaction with ex-partner who is having difficulty  respecting boundaries  CCM LCSW provided validation and encouragement. Strategies to assist in promoting healthy boundaries were discussed to promote stress management Patient continues to care for mother with diagnosis of dementia. This has resulted in patient's difficulty managing blood pressure due to stress from staying at mother's residence for five days a week Patient reports no recent seizures. She has been cleared to begin driving again and is excited about indepence Patient continues to receive strong support from family (son and brother) Patient is interested in applying for disability. CCM LCSW discussed process and provided web-site where she can apply ([email or web address] Patient's questions regarding process were answered CCM LCSW reviewed upcoming appointments with patient Patient denies need for any additional resources at this time Discussed plans with patient for ongoing care management follow up and provided patient with direct contact information for care management team Other interventions provided: Solution-Focused Strategies and Emotional/Supportive Counseling Collaboration with PCP regarding development and update of comprehensive plan of care as evidenced by  provider attestation and co-signature Inter-disciplinary care team collaboration (see longitudinal plan of care) Patient Self Activities/Goals: Continue utilizing healthy coping skills discussed Follow up with PCP regarding request for assistance with hair loss and scheduled appointments Practice positive self-talk to promote positive mood       Christa See, MSW, [email or web address] Phone [phone] 4:08 PM

## 2021-06-30 NOTE — Patient Instructions (Signed)
Visit Information   Goals Addressed             This Visit's Progress    SW-Track and Manage My Symptoms-Depression   On track    Timeframe:  Long-Range Goal Priority:  Medium Start Date: 02/10/21                            Expected End Date:  10/20/21                     Follow Up Date- 09/08/21   Patient Self Activities/Goals: Continue utilizing healthy coping skills discussed Follow up with PCP regarding request for assistance with hair loss and scheduled appointments Practice positive self-talk to promote positive mood         Patient verbalizes understanding of instructions provided today and agrees to view in [place].   Telephone follow up appointment with care management team member scheduled for:09/08/21  Kelly Poole, MSW, [email or web address] Phone [phone] 4:11 PM

## 2021-07-02 ENCOUNTER — Encounter: Payer: Self-pay | Admitting: Internal Medicine

## 2021-07-02 ENCOUNTER — Telehealth (INDEPENDENT_AMBULATORY_CARE_PROVIDER_SITE_OTHER): Payer: Medicare HMO | Admitting: Internal Medicine

## 2021-07-02 VITALS — BP 166/89 | Temp 96.6°F

## 2021-07-02 DIAGNOSIS — J309 Allergic rhinitis, unspecified: Secondary | ICD-10-CM

## 2021-07-02 MED ORDER — FEXOFENADINE HCL 180 MG PO TABS: 180.0000 mg | ORAL_TABLET | Freq: Every day | ORAL | 0 refills | Status: DC

## 2021-07-02 NOTE — Progress Notes (Signed)
BP (!) 166/89   Temp (!) 96.6 F (35.9 C) (Oral)    Subjective:    Patient ID: Kelly Poole, female    DOB: [birth year], 70 y.o.   MRN: [id number]  Chief Complaint  Patient presents with   Sore Throat   Sinusitis    Patient started to use Flonase last night seems to be helping     HPI: Kelly Poole is a 70 y.o. female   This visit was completed via video visit through [place] due to the restrictions of the COVID-19 pandemic. All issues as above were discussed and through telephone call as pt wasn't able to log in through MyChart. If it was felt that the patient should be evaluated in the office, they were directed there. The patient verbally consented to this visit. Location of the patient: home Location of the provider: work Those involved with this call:  Provider: Charlynne Cousins, MD CMA: Frazier Butt, [place] Desk/Registration: Jill Side  Time spent on call: 10 minutes on the phone discussing health concerns. 8 minutes total spent in review of patient's record and preparation of their chart.    Sore Throat  This is a new (better now took flonase has post nasal drip and this is better today.) problem. The current episode started yesterday. The problem has been gradually improving. There has been no fever. Associated symptoms include congestion. Pertinent negatives include no abdominal pain, coughing, diarrhea, drooling, ear discharge, ear pain, headaches, hoarse voice, neck pain, shortness of breath, stridor, swollen glands, trouble swallowing or vomiting.  Sinusitis This is a new problem. Associated symptoms include congestion. Pertinent negatives include no chills, coughing, diaphoresis, ear pain, headaches, hoarse voice, neck pain, shortness of breath, sinus pressure, sneezing, sore throat or swollen glands.   Chief Complaint  Patient presents with   Sore Throat   Sinusitis    Patient started to use Flonase last night seems to be helping     Relevant  past medical, surgical, family and social history reviewed and updated as indicated. Interim medical history since our last visit reviewed. Allergies and medications reviewed and updated.  Review of Systems  Constitutional:  Negative for chills and diaphoresis.  HENT:  Positive for congestion. Negative for drooling, ear discharge, ear pain, hoarse voice, sinus pressure, sneezing, sore throat and trouble swallowing.   Respiratory:  Negative for cough, shortness of breath and stridor.   Gastrointestinal:  Negative for abdominal pain, diarrhea and vomiting.  Musculoskeletal:  Negative for neck pain.  Neurological:  Negative for headaches.   Per HPI unless specifically indicated above     Objective:    BP (!) 166/89   Temp (!) 96.6 F (35.9 C) (Oral)   Wt Readings from Last 3 Encounters:  02/24/21 140 lb 6.4 oz (63.7 kg)  12/31/20 139 lb 6.4 oz (63.2 kg)  12/25/20 141 lb 12.1 oz (64.3 kg)    Physical Exam  Unable to peform sec to virtual visit.   Results for orders placed or performed in visit on 03/17/21  PTH, Intact and Calcium  Result Value Ref Range   PTH 81 (H) 15 - 65 pg/mL   PTH Interp Comment   Microalbumin, Urine Waived  Result Value Ref Range   Microalb, Ur Waived 80 (H) 0 - 19 mg/L   Creatinine, Urine Waived 300 10 - 300 mg/dL   Microalb/Creat Ratio 30-300 (H) <30 mg/g  Basic metabolic panel  Result Value Ref Range   Glucose 91 65 - 99 mg/dL  BUN 17 8 - 27 mg/dL   Creatinine, Ser 1.31 (H) 0.57 - 1.00 mg/dL   eGFR 45 (L) >59 mL/min/1.73   BUN/Creatinine Ratio 13 12 - 28   Sodium 141 134 - 144 mmol/L   Potassium 4.1 3.5 - 5.2 mmol/L   Chloride 105 96 - 106 mmol/L   CO2 21 20 - 29 mmol/L   Calcium 9.4 8.7 - 10.3 mg/dL        Current Outpatient Medications:    Ascorbic Acid (VITAMIN C) 1000 MG tablet, Take 1,000 mg by mouth daily., Disp: , Rfl:    aspirin 81 MG tablet, Take 81 mg by mouth daily., Disp: , Rfl:    atorvastatin (LIPITOR) 20 MG tablet, Take 1  tablet (20 mg total) by mouth daily., Disp: 90 tablet, Rfl: 4   Cholecalciferol 50 MCG (2000 UT) TABS, Take 1 tablet by mouth daily., Disp: , Rfl:    ferrous sulfate 325 (65 FE) MG tablet, Take 1 tablet (325 mg total) by mouth daily with breakfast., Disp: 90 tablet, Rfl: 4   Garlic 2493 MG CAPS, Take 1,000 mg by mouth daily., Disp: , Rfl:    loratadine (CLARITIN) 10 MG tablet, Take 10 mg by mouth daily., Disp: , Rfl:    losartan (COZAAR) 100 MG tablet, Take 1 tablet (100 mg total) by mouth daily., Disp: 90 tablet, Rfl: 1   Multiple Vitamin (MULTIVITAMINS PO), Take by mouth daily., Disp: , Rfl:    omeprazole (PRILOSEC) 20 MG capsule, Take 1 capsule (20 mg total) by mouth daily., Disp: 90 capsule, Rfl: 4   vitamin E 100 UNIT capsule, Take 100 Units by mouth daily., Disp: , Rfl:    fluticasone (FLONASE) 50 MCG/ACT nasal spray, Place 2 sprays into both nostrils daily for 14 days., Disp: 16 g, Rfl: 0   hydrochlorothiazide (HYDRODIURIL) 25 MG tablet, Take 1 tablet (25 mg total) by mouth daily. (Patient not taking: No sig reported), Disp: 90 tablet, Rfl: 3   levETIRAcetam (KEPPRA) 500 MG tablet, Take 1 tablet (500 mg total) by mouth 2 (two) times daily. New seizure medication., Disp: 60 tablet, Rfl: 2   sennosides-docusate sodium (SENOKOT-S) 8.6-50 MG tablet, Take 1 tablet by mouth daily. (Patient not taking: Reported on 07/02/2021), Disp: , Rfl:     Assessment & Plan:  Allergic rhinitis : feeling better already on flonase continue such Increase fluid intake.  Headahce - tyelnol every 4-6 hrs prn  Allegra / claritin.        No orders of the defined types were placed in this encounter.    Meds ordered this encounter  Medications   fexofenadine (ALLEGRA ALLERGY) 180 MG tablet    Sig: Take 1 tablet (180 mg total) by mouth daily.    Dispense:  10 tablet    Refill:  0     Follow up plan: No follow-ups on file.

## 2021-07-07 ENCOUNTER — Encounter: Payer: Self-pay | Admitting: Nurse Practitioner

## 2021-07-07 ENCOUNTER — Ambulatory Visit (INDEPENDENT_AMBULATORY_CARE_PROVIDER_SITE_OTHER): Payer: Medicare HMO | Admitting: Nurse Practitioner

## 2021-07-07 ENCOUNTER — Other Ambulatory Visit: Payer: Self-pay

## 2021-07-07 VITALS — BP 118/78 | HR 74 | Temp 98.4°F | Wt 140.2 lb

## 2021-07-07 DIAGNOSIS — N2581 Secondary hyperparathyroidism of renal origin: Secondary | ICD-10-CM | POA: Diagnosis not present

## 2021-07-07 DIAGNOSIS — N1832 Chronic kidney disease, stage 3b: Secondary | ICD-10-CM

## 2021-07-07 DIAGNOSIS — I10 Essential (primary) hypertension: Secondary | ICD-10-CM

## 2021-07-07 DIAGNOSIS — D649 Anemia, unspecified: Secondary | ICD-10-CM | POA: Diagnosis not present

## 2021-07-07 DIAGNOSIS — K219 Gastro-esophageal reflux disease without esophagitis: Secondary | ICD-10-CM

## 2021-07-07 DIAGNOSIS — D518 Other vitamin B12 deficiency anemias: Secondary | ICD-10-CM | POA: Diagnosis not present

## 2021-07-07 DIAGNOSIS — E7800 Pure hypercholesterolemia, unspecified: Secondary | ICD-10-CM

## 2021-07-07 DIAGNOSIS — R569 Unspecified convulsions: Secondary | ICD-10-CM

## 2021-07-07 NOTE — Progress Notes (Signed)
BP 118/78 (BP Location: Left Arm)   Pulse 74   Temp 98.4 F (36.9 C) (Oral)   Wt 140 lb 3.2 oz (63.6 kg)   SpO2 97%   BMI 30.34 kg/m    Subjective:    Patient ID: Kelly Poole, female    DOB: 11-08-1951, 70 y.o.   MRN: [id number]  HPI: Kelly Poole is a 70 y.o. female  Chief Complaint  Patient presents with   Follow-up    Patient states she is taking seizure medication and she is having symptoms like depression and fatigue. Patient states she is experiencing every symptoms listed as a side effects. Patient states she has not had an seizure since January. Patient wants to discuss with provider about stopping mediation. Patient states she does not have an appointment with Neuro until September.    HYPERTENSION / HYPERLIPIDEMIA Continues on Losartan 100 MG daily, ASA, and Atorvastatin for HLD.    Has history of seizures over past year -- which she feels is related to her being out of blood pressure medications for 5 days -- they were on [employer] truck coming from [place].  She is currently seeing Dr. Manuella Ghazi with neurology and last saw 03/13/21.  Currently taking Keppra, she is frustrated by side effects of this -- making her depressed and irritable, like she is angry with people -- feels like "the devil is inside of me".  She does not like the way she is feeling.  She has not talked to Dr. Manuella Ghazi about side effects -- she sees him in September.  Has not had any further seizures.   Satisfied with current treatment? yes Duration of hypertension: chronic BP monitoring frequency: daily BP range: 110/70-80 range at home BP medication side effects: no Duration of hyperlipidemia: chronic Cholesterol medication side effects: no Cholesterol supplements: none Medication compliance: good compliance Aspirin: yes Recent stressors: no Recurrent headaches: no Visual changes: no Palpitations: no Dyspnea: no Chest pain: no Lower extremity edema: no Dizzy/lightheaded: no    GERD Continues  on Prilosec 20 MG daily. GERD control status: stable  Satisfied with current treatment? yes Heartburn frequency: none Medication side effects: no  Medication compliance: stable Previous GERD medications: OTC medications Antacid use frequency:  none Dysphagia: no Odynophagia:  no Hematemesis: no Blood in stool: no EGD: no   CHRONIC KIDNEY DISEASE March labs with CRT 1.31 and GFR 45, PTH 81. CKD status: stable Medications renally dose: no Previous renal evaluation: no Pneumovax:  refused Influenza Vaccine:  refused  ANEMIA Last saw hematology last 04/04/20 and told to restart iron supplement.  Taking ferrous sulfate twice daily, no B12 at this time. Recent iron 81 and ferritin 324. Anemia status: stable Etiology of anemia: iron deficiency  Duration of anemia treatment: lifelong Compliance with treatment: good compliance Iron supplementation side effects: no Severity of anemia: mild Fatigue: no Decreased exercise tolerance: no  Dyspnea on exertion: no Palpitations: no Bleeding: no Pica: no  Has history of breast cancer and requests form sent to [facility] to have her prothesis replaced  Relevant past medical, surgical, family and social history reviewed and updated as indicated. Interim medical history since our last visit reviewed. Allergies and medications reviewed and updated.  Review of Systems  Constitutional:  Negative for activity change, appetite change, diaphoresis, fatigue and fever.  Respiratory:  Negative for cough, chest tightness and shortness of breath.   Cardiovascular:  Negative for chest pain, palpitations and leg swelling.  Gastrointestinal: Negative.   Neurological: Negative.  Psychiatric/Behavioral: Negative.     Per HPI unless specifically indicated above     Objective:    BP 118/78 (BP Location: Left Arm)   Pulse 74   Temp 98.4 F (36.9 C) (Oral)   Wt 140 lb 3.2 oz (63.6 kg)   SpO2 97%   BMI 30.34 kg/m   Wt Readings from Last 3  Encounters:  07/07/21 140 lb 3.2 oz (63.6 kg)  02/24/21 140 lb 6.4 oz (63.7 kg)  12/31/20 139 lb 6.4 oz (63.2 kg)    Physical Exam Vitals and nursing note reviewed.  Constitutional:      General: She is awake. She is not in acute distress.    Appearance: She is well-developed, well-groomed and overweight. She is not ill-appearing.  HENT:     Head: Normocephalic.     Right Ear: Hearing normal.     Left Ear: Hearing normal.  Eyes:     General: Lids are normal.        Right eye: No discharge.        Left eye: No discharge.     Conjunctiva/sclera: Conjunctivae normal.     Pupils: Pupils are equal, round, and reactive to light.  Neck:     Thyroid: No thyromegaly.     Vascular: No carotid bruit.  Cardiovascular:     Rate and Rhythm: Normal rate and regular rhythm.     Heart sounds: Normal heart sounds. No murmur heard.   No gallop.  Pulmonary:     Effort: Pulmonary effort is normal. No accessory muscle usage or respiratory distress.     Breath sounds: Normal breath sounds.  Abdominal:     General: Bowel sounds are normal.     Palpations: Abdomen is soft.  Musculoskeletal:     Cervical back: Normal range of motion and neck supple.     Right lower leg: No edema.     Left lower leg: No edema.  Lymphadenopathy:     Cervical: No cervical adenopathy.  Skin:    General: Skin is warm and dry.  Neurological:     Mental Status: She is alert and oriented to person, place, and time.  Psychiatric:        Attention and Perception: Attention normal.        Mood and Affect: Mood normal.        Speech: Speech normal.        Behavior: Behavior normal. Behavior is cooperative.        Thought Content: Thought content normal.   Results for orders placed or performed in visit on 03/17/21  PTH, Intact and Calcium  Result Value Ref Range   PTH 81 (H) 15 - 65 pg/mL   PTH Interp Comment   Microalbumin, Urine Waived  Result Value Ref Range   Microalb, Ur Waived 80 (H) 0 - 19 mg/L    Creatinine, Urine Waived 300 10 - 300 mg/dL   Microalb/Creat Ratio 30-300 (H) <30 mg/g  Basic metabolic panel  Result Value Ref Range   Glucose 91 65 - 99 mg/dL   BUN 17 8 - 27 mg/dL   Creatinine, Ser 1.31 (H) 0.57 - 1.00 mg/dL   eGFR 45 (L) >59 mL/min/1.73   BUN/Creatinine Ratio 13 12 - 28   Sodium 141 134 - 144 mmol/L   Potassium 4.1 3.5 - 5.2 mmol/L   Chloride 105 96 - 106 mmol/L   CO2 21 20 - 29 mmol/L   Calcium 9.4 8.7 - 10.3 mg/dL  Assessment & Plan:   Problem List Items Addressed This Visit       Cardiovascular and Mediastinum   Hypertension    Chronic, ongoing with BP below goal in office on recheck and at home. Will continue Losartan 100 MG, has taken this in past and tolerates well.  Recommend she monitor BP at home at least as few mornings a week and document, report to provider if consistent elevation >130/80.  DASH diet focus.  CMP and CBC today.  Return in February for physical.         Digestive   Gastroesophageal reflux disease (Chronic)    Chronic, stable on Omeprazole.  Reports return of symptoms without medication, when has trialed GDR in past.  Continue current dose.  Mag level up to date.  Return in 6 months.         Endocrine   Secondary hyperparathyroidism (HCC)    Chronic, stable.  Continue to collaborate with nephrology as needed.  PTH on labs today.       Relevant Orders   PTH, Intact and Calcium   Comprehensive metabolic panel     Genitourinary   CKD (chronic kidney disease) stage 3, GFR 30-59 ml/min (HCC)    Ongoing, CKD 3.  Continue Losartan for kidney protection.  CMP and PTH today.  Refer back to nephrology as needed for worsening kidney function.       Relevant Orders   Comprehensive metabolic panel     Other   Hyperlipidemia    Chronic, ongoing.  Continue current medication regimen and adjust as needed.  Lipid panel today.       Relevant Orders   Lipid Panel w/o Chol/HDL Ratio   Vitamin B12 deficiency (dietary) anemia     Chronic, stable.  No current supplement as levels have been stable. Check B12 level today and adjust regimen as needed.       Relevant Orders   Vitamin B12   Anemia    Chronic, ongoing.  Continue daily iron supplement.  Recheck iron, B12, and CBC today.  Return to hematology as needed.       Relevant Orders   CBC with Differential/Platelet   Iron, TIBC and Ferritin Panel   Seizure (HCC) - Primary    Chronic, stable at this time.  Continue to collaborate with neurology and continue current medication regimen as prescribed by them -- however, recommend she reach out ASAP to them today to discuss current side effects and hold Keppra dose this evening -- trial once daily dosing and discuss with neurology if they recommend discontinuation of regimen.  Keppra level today.       Relevant Orders   Levetiracetam level     Follow up plan: Return in about 6 weeks (around 08/18/2021) for SEIZURE DISORDER.

## 2021-07-07 NOTE — Assessment & Plan Note (Signed)
Chronic, stable.  No current supplement as levels have been stable. Check B12 level today and adjust regimen as needed. 

## 2021-07-07 NOTE — Assessment & Plan Note (Signed)
Chronic, ongoing.  Continue daily iron supplement.  Recheck iron, B12, and CBC today.  Return to hematology as needed. 

## 2021-07-07 NOTE — Assessment & Plan Note (Signed)
Ongoing, CKD 3.  Continue Losartan for kidney protection.  CMP and PTH today.  Refer back to nephrology as needed for worsening kidney function.

## 2021-07-07 NOTE — Assessment & Plan Note (Signed)
Chronic, ongoing.  Continue current medication regimen and adjust as needed. Lipid panel today. 

## 2021-07-07 NOTE — Assessment & Plan Note (Signed)
Chronic, ongoing with BP below goal in office on recheck and at home. Will continue Losartan 100 MG, has taken this in past and tolerates well.  Recommend she monitor BP at home at least as few mornings a week and document, report to provider if consistent elevation >130/80.  DASH diet focus.  CMP and CBC today.  Return in February for physical.

## 2021-07-07 NOTE — Assessment & Plan Note (Signed)
Chronic, stable.  Continue to collaborate with nephrology as needed.  PTH on labs today.

## 2021-07-07 NOTE — Assessment & Plan Note (Signed)
Chronic, stable on Omeprazole.  Reports return of symptoms without medication, when has trialed GDR in past.  Continue current dose.  Mag level up to date.  Return in 6 months.

## 2021-07-07 NOTE — Assessment & Plan Note (Signed)
Chronic, stable at this time.  Continue to collaborate with neurology and continue current medication regimen as prescribed by them -- however, recommend she reach out ASAP to them today to discuss current side effects and hold Keppra dose this evening -- trial once daily dosing and discuss with neurology if they recommend discontinuation of regimen.  Keppra level today.

## 2021-07-07 NOTE — Patient Instructions (Signed)
http://APA.org/depression-guideline"> https://clinicalkey.com"> http://point-of-care.elsevierperformancemanager.com/skills/"> http://point-of-care.elsevierperformancemanager.com">  Managing Depression, Adult Depression is a mental health condition that affects your thoughts, feelings, and actions. Being diagnosed with depression can bring you relief if you did not know why you have felt or behaved a certain way. It could also leave you feeling overwhelmed with uncertainty about your future. Preparing yourself tomanage your symptoms can help you feel more positive about your future. How to manage lifestyle changes Managing stress  Stress is your body's reaction to life changes and events, both good and bad. Stress can add to your feelings of depression. Learning to manage your stresscan help lessen your feelings of depression. Try some of the following approaches to reducing your stress (stress reduction techniques): Listen to music that you enjoy and that inspires you. Try using a meditation app or take a meditation class. Develop a practice that helps you connect with your spiritual self. Walk in nature, pray, or go to a place of worship. Do some deep breathing. To do this, inhale slowly through your nose. Pause at the top of your inhale for a few seconds and then exhale slowly, letting your muscles relax. Practice yoga to help relax and work your muscles. Choose a stress reduction technique that suits your lifestyle and personality. These techniques take time and practice to develop. Set aside 5-15 minutes a day to do them. Therapists can offer training in these techniques. Other things you can do to manage stress include: Keeping a stress diary. Knowing your limits and saying no when you think something is too much. Paying attention to how you react to certain situations. You may not be able to control everything, but you can change your reaction. Adding humor to your life by watching funny films  or TV shows. Making time for activities that you enjoy and that relax you.  Medicines Medicines, such as antidepressants, are often a part of treatment for depression. Talk with your pharmacist or health care provider about all the medicines, supplements, and herbal products that you take, their possible side effects, and what medicines and other products are safe to take together. Make sure to report any side effects you may have to your health care provider. Relationships Your health care provider may suggest family therapy, couples therapy, orindividual therapy as part of your treatment. How to recognize changes Everyone responds differently to treatment for depression. As you recover from depression, you may start to: Have more interest in doing activities. Feel less hopeless. Have more energy. Overeat less often, or have a better appetite. Have better mental focus. It is important to recognize if your depression is not getting better or is getting worse. The symptoms you had in the beginning may return, such as: Tiredness (fatigue) or low energy. Eating too much or too little. Sleeping too much or too little. Feeling restless, agitated, or hopeless. Trouble focusing or making decisions. Unexplained physical complaints. Feeling irritable, angry, or aggressive. If you or your family members notice these symptoms coming back, let yourhealth care provider know right away. Follow these instructions at home: Activity  Try to get some form of exercise each day, such as walking, biking, swimming, or lifting weights. Practice stress reduction techniques. Engage your mind by taking a class or doing some volunteer work.  Lifestyle Get the right amount and quality of sleep. Cut down on using caffeine, tobacco, alcohol, and other potentially harmful substances. Eat a healthy diet that includes plenty of vegetables, fruits, whole grains, low-fat dairy products, and lean protein. Do not   eat  a lot of foods that are high in solid fats, added sugars, or salt (sodium). General instructions Take over-the-counter and prescription medicines only as told by your health care provider. Keep all follow-up visits as told by your health care provider. This is important. Where to find support Talking to others  Friends and family members can be sources of support and guidance. Talk to trusted friends or family members about your condition. Explain your symptoms to them, and let them know that you are working with a health care provider to treat your depression. Tell friends and family members how they also can behelpful. Finances Find appropriate mental health providers that fit with your financial situation. Talk with your health care provider about options to get reduced prices on your medicines. Where to find more information You can find support in your area from: Anxiety and Depression Association of America (ADAA): www.adaa.org Mental Health America: www.mentalhealthamerica.net National Alliance on Mental Illness: www.nami.org Contact a health care provider if: You stop taking your antidepressant medicines, and you have any of these symptoms: Nausea. Headache. Light-headedness. Chills and body aches. Not being able to sleep (insomnia). You or your friends and family think your depression is getting worse. Get help right away if: You have thoughts of hurting yourself or others. If you ever feel like you may hurt yourself or others, or have thoughts about taking your own life, get help right away. Go to your nearest emergency department or: Call your local emergency services (911 in the [place] Call a suicide crisis helpline, such as the National Suicide Prevention Lifeline at 1-800-273-8255. This is open 24 hours a day in the [place] Text the Crisis Text Line at 741741 (in the [place] Summary If you are diagnosed with depression, preparing yourself to manage your symptoms is a good way  to feel positive about your future. Work with your health care provider on a management plan that includes stress reduction techniques, medicines (if applicable), therapy, and healthy lifestyle habits. Keep talking with your health care provider about how your treatment is working. If you have thoughts about taking your own life, call a suicide crisis helpline or text a crisis text line. This information is not intended to replace advice given to you by your health care provider. Make sure you discuss any questions you have with your healthcare provider. Document Revised: 10/18/2019 Document Reviewed: 10/18/2019 Elsevier Patient Education  2022 Elsevier Inc.  

## 2021-07-08 NOTE — Progress Notes (Signed)
Contacted via [name] afternoon Kelly Poole, your labs have returned. Parathyroid level is 33, improved from previous and in normal range.  Kidney function is still showing kidney disease stage 3, no worsening.  Anemia is improving.  Waiting on Keppra level.  Did you talk to neurology about the medication? Keep being awesome!!  Thank you for allowing me to participate in your care.  I appreciate you. Kindest regards, Kelly Poole

## 2021-07-10 ENCOUNTER — Telehealth: Payer: Self-pay

## 2021-07-10 NOTE — Telephone Encounter (Signed)
Patient is aware of results, states her neurologist is starting her on lamotrigine.

## 2021-07-10 NOTE — Telephone Encounter (Signed)
Noted  

## 2021-07-10 NOTE — Telephone Encounter (Signed)
Copied from [place] [id number]. Topic: General - Other >> Jul 10, 2021  3:01 PM Leward Quan A wrote: Reason for CRM: Patient would like a call back about her lab results please ASAP at Ph# [phone]

## 2021-07-11 LAB — IRON,TIBC AND FERRITIN PANEL
Ferritin: 328 ng/mL — ABNORMAL HIGH (ref 15–150)
Iron Saturation: 48 % (ref 15–55)
Iron: 86 ug/dL (ref 27–139)
Total Iron Binding Capacity: 178 ug/dL — ABNORMAL LOW (ref 250–450)
UIBC: 92 ug/dL — ABNORMAL LOW (ref 118–369)

## 2021-07-11 LAB — COMPREHENSIVE METABOLIC PANEL
ALT: 15 IU/L (ref 0–32)
AST: 19 IU/L (ref 0–40)
Albumin/Globulin Ratio: 1.6 (ref 1.2–2.2)
Albumin: 4.8 g/dL (ref 3.8–4.8)
Alkaline Phosphatase: 111 IU/L (ref 44–121)
BUN/Creatinine Ratio: 13 (ref 12–28)
BUN: 17 mg/dL (ref 8–27)
Bilirubin Total: <0.2 mg/dL (ref 0.0–1.2)
CO2: 23 mmol/L (ref 20–29)
Calcium: 10.4 mg/dL — ABNORMAL HIGH (ref 8.7–10.3)
Chloride: 103 mmol/L (ref 96–106)
Creatinine, Ser: 1.29 mg/dL — ABNORMAL HIGH (ref 0.57–1.00)
Globulin, Total: 3 g/dL (ref 1.5–4.5)
Glucose: 94 mg/dL (ref 65–99)
Potassium: 4.7 mmol/L (ref 3.5–5.2)
Sodium: 140 mmol/L (ref 134–144)
Total Protein: 7.8 g/dL (ref 6.0–8.5)
eGFR: 45 mL/min/{1.73_m2} — ABNORMAL LOW (ref >59)

## 2021-07-11 LAB — LIPID PANEL W/O CHOL/HDL RATIO
Cholesterol, Total: 188 mg/dL (ref 100–199)
HDL: 55 mg/dL (ref >39)
LDL Chol Calc (NIH): 113 mg/dL — ABNORMAL HIGH (ref 0–99)
Triglycerides: 112 mg/dL (ref 0–149)
VLDL Cholesterol Cal: 20 mg/dL (ref 5–40)

## 2021-07-11 LAB — CBC WITH DIFFERENTIAL/PLATELET
Basophils Absolute: 0.1 10*3/uL (ref 0.0–0.2)
Basos: 1 %
EOS (ABSOLUTE): 0.2 10*3/uL (ref 0.0–0.4)
Eos: 2 %
Hematocrit: 34.2 % (ref 34.0–46.6)
Hemoglobin: 11.4 g/dL (ref 11.1–15.9)
Immature Grans (Abs): 0 10*3/uL (ref 0.0–0.1)
Immature Granulocytes: 0 %
Lymphocytes Absolute: 2.4 10*3/uL (ref 0.7–3.1)
Lymphs: 29 %
MCH: 31 pg (ref 26.6–33.0)
MCHC: 33.3 g/dL (ref 31.5–35.7)
MCV: 93 fL (ref 79–97)
Monocytes Absolute: 0.6 10*3/uL (ref 0.1–0.9)
Monocytes: 8 %
Neutrophils Absolute: 5 10*3/uL (ref 1.4–7.0)
Neutrophils: 60 %
Platelets: 327 10*3/uL (ref 150–450)
RBC: 3.68 x10E6/uL — ABNORMAL LOW (ref 3.77–5.28)
RDW: 12.7 % (ref 11.7–15.4)
WBC: 8.3 10*3/uL (ref 3.4–10.8)

## 2021-07-11 LAB — VITAMIN B12: Vitamin B-12: 1770 pg/mL — ABNORMAL HIGH (ref 232–1245)

## 2021-07-11 LAB — PTH, INTACT AND CALCIUM: PTH: 33 pg/mL (ref 15–65)

## 2021-07-11 LAB — LEVETIRACETAM LEVEL: Levetiracetam Lvl: 41.5 ug/mL — ABNORMAL HIGH (ref 10.0–40.0)

## 2021-07-11 NOTE — Progress Notes (Signed)
Contacted via MyChart   Good morning Kelly Poole, your Keppra level had been a little on high side, so this may have been causing some of your side effects.  I hear they switched you to something different though.  Let me know how you are doing. Keep being awesome!!  Thank you for allowing me to participate in your care.  I appreciate you. Kindest regards, Berwyn Bigley

## 2021-07-12 ENCOUNTER — Other Ambulatory Visit: Payer: Self-pay | Admitting: Nurse Practitioner

## 2021-07-19 ENCOUNTER — Other Ambulatory Visit: Payer: Self-pay | Admitting: Nurse Practitioner

## 2021-08-04 DIAGNOSIS — N1832 Chronic kidney disease, stage 3b: Secondary | ICD-10-CM | POA: Diagnosis not present

## 2021-08-04 DIAGNOSIS — I129 Hypertensive chronic kidney disease with stage 1 through stage 4 chronic kidney disease, or unspecified chronic kidney disease: Secondary | ICD-10-CM | POA: Diagnosis not present

## 2021-08-04 DIAGNOSIS — N2581 Secondary hyperparathyroidism of renal origin: Secondary | ICD-10-CM | POA: Diagnosis not present

## 2021-08-04 DIAGNOSIS — D631 Anemia in chronic kidney disease: Secondary | ICD-10-CM | POA: Diagnosis not present

## 2021-08-07 ENCOUNTER — Other Ambulatory Visit: Payer: Self-pay | Admitting: Nurse Practitioner

## 2021-08-07 NOTE — Telephone Encounter (Signed)
Requested medication (s) are due for refill today: no  Requested medication (s) are on the active medication list: yes   Last refill:  06/06/2021  Future visit scheduled:yes   Notes to clinic:  Failed protocol: RBC in normal range and within 360 days   Ferritin in normal range and within 360 days     Requested Prescriptions  Pending Prescriptions Disp Refills   FEROSUL 325 (65 Fe) MG tablet [Pharmacy Med Name: FEROSUL 325 (65 Fe) MG Tablet] 90 tablet 4    Sig: TAKE 1 [place]     Endocrinology:  Minerals - Iron Supplementation Failed - 08/07/2021  2:04 PM      Failed - RBC in normal range and within 360 days    RBC  Date Value Ref Range Status  07/07/2021 3.68 (L) 3.77 - 5.28 x10E6/uL Final  12/26/2020 2.89 (L) 3.87 - 5.11 MIL/uL Final          Failed - Ferritin in normal range and within 360 days    Ferritin  Date Value Ref Range Status  07/07/2021 328 (H) 15 - 150 ng/mL Final          Passed - HGB in normal range and within 360 days    Hemoglobin  Date Value Ref Range Status  07/07/2021 11.4 11.1 - 15.9 g/dL Final          Passed - HCT in normal range and within 360 days    Hematocrit  Date Value Ref Range Status  07/07/2021 34.2 34.0 - 46.6 % Final          Passed - Fe (serum) in normal range and within 360 days    Iron  Date Value Ref Range Status  07/07/2021 86 27 - 139 ug/dL Final   Iron Saturation  Date Value Ref Range Status  07/07/2021 48 15 - 55 % Final          Passed - Valid encounter within last 12 months    Recent Outpatient Visits           1 month ago Seizure ([place])   [place], Oconto T, NP   1 month ago Allergic rhinitis, unspecified seasonality, unspecified trigger   [place], Avanti, MD   5 months ago Seizure ([place])   [place], Mayfield T, NP   7 months ago Anemia, unspecified type   [facility] Myles Gip, DO   8 months  ago Right ear pain   [place], Jake Church, DO       Future Appointments             In 1 week Cannady, Barbaraann Faster, NP [facility] [place]   In 1 month Sherman, Carey T, NP [facility]   In 2 months  [facility]

## 2021-08-15 ENCOUNTER — Telehealth: Payer: Self-pay

## 2021-08-15 NOTE — Progress Notes (Signed)
Chronic Care Management Pharmacy Assistant   Name: ALEXZIA VISWANATHAN  MRN: [id number] DOB: 1951/04/24   Reason for Encounter: Disease State Hypertension     Recent office visits:  06/18/21 Marnee Guarneri NP - No medication changes noted - No follow ups noted 07/07/21 Marnee Guarneri NP - Follow up visit - labs ordered - No medication changes noted - Follow up in 6 weeks   Recent consult visits:  08/04/21 [facility] - Follow up visit - no medication changes noted - follow up in 6 months   Hospital visits:  None in previous 6 months  Medications: Outpatient Encounter Medications as of 08/15/2021  Medication Sig Note   Ascorbic Acid (VITAMIN C) 1000 MG tablet Take 1,000 mg by mouth daily.    aspirin 81 MG tablet Take 81 mg by mouth daily.    atorvastatin (LIPITOR) 20 MG tablet Take 1 tablet (20 mg total) by mouth daily.    Cholecalciferol 50 MCG (2000 UT) TABS Take 1 tablet by mouth daily.    FEROSUL 325 (65 Fe) MG tablet TAKE 1 TABLET EVERY DAY WITH BREAKFAST    fexofenadine (ALLEGRA ALLERGY) 180 MG tablet Take 1 tablet (180 mg total) by mouth daily.    fluticasone (FLONASE) 50 MCG/ACT nasal spray Place 2 sprays into both nostrils daily for 14 days.    Garlic 123XX123 MG CAPS Take 1,000 mg by mouth daily. 10/12/2019: Doesn't take regular   levETIRAcetam (KEPPRA) 500 MG tablet Take 1 tablet (500 mg total) by mouth 2 (two) times daily. New seizure medication.    losartan (COZAAR) 100 MG tablet TAKE 1 TABLET EVERY DAY    Multiple Vitamin (MULTIVITAMINS PO) Take by mouth daily.    omeprazole (PRILOSEC) 20 MG capsule TAKE 1 CAPSULE EVERY DAY    sennosides-docusate sodium (SENOKOT-S) 8.6-50 MG tablet Take 1 tablet by mouth daily.    vitamin E 100 UNIT capsule Take 100 Units by mouth daily.    No facility-administered encounter medications on file as of 08/15/2021.  Losartan 100 mg qd HCTZ 25  Mg qd   Reviewed chart prior to disease state call. Spoke with patient  regarding BP  Recent Office Vitals: BP Readings from Last 3 Encounters:  07/07/21 118/78  07/02/21 (!) 166/89  02/24/21 100/68   Pulse Readings from Last 3 Encounters:  07/07/21 74  02/24/21 77  12/31/20 80    Wt Readings from Last 3 Encounters:  07/07/21 140 lb 3.2 oz (63.6 kg)  02/24/21 140 lb 6.4 oz (63.7 kg)  12/31/20 139 lb 6.4 oz (63.2 kg)     Kidney Function Lab Results  Component Value Date/Time   CREATININE 1.29 (H) 07/07/2021 10:24 AM   CREATININE 1.31 (H) 03/17/2021 08:08 AM   GFRNONAA 42 (L) 12/26/2020 04:25 AM   GFRAA 45 (L) 07/30/2020 03:40 PM    BMP Latest Ref Rng & Units 07/07/2021 03/17/2021 02/24/2021  Glucose 65 - 99 mg/dL 94 91 99  BUN 8 - 27 mg/dL 17 17 38(H)  Creatinine 0.57 - 1.00 mg/dL 1.29(H) 1.31(H) 2.03(H)  BUN/Creat Ratio 12 - '28 13 13 19  '$ Sodium 134 - 144 mmol/L 140 141 141  Potassium 3.5 - 5.2 mmol/L 4.7 4.1 5.0  Chloride 96 - 106 mmol/L 103 105 103  CO2 20 - 29 mmol/L 23 21 19(L)  Calcium 8.7 - 10.3 mg/dL 10.4(H) 9.4 9.7    Current antihypertensive regimen:  Losartan 100 mg qd  How often are you checking your Blood Pressure?  twice daily Current home BP readings: N/a, Patient unable to get readings at the time of call.  What recent interventions/DTPs have been made by any provider to improve Blood Pressure control since last CPP Visit: N/a Any recent hospitalizations or ED visits since last visit with CPP? No What diet changes have been made to improve Blood Pressure Control?  Patient states that she tries to eat well. She states that she limits her salt intake and occasionally enjoys pork chops every once in a while.  What exercise is being done to improve your Blood Pressure Control?  Patient states that she works to take care of her mother, Doristine Church to the gym every once in a while, and mows her lawn herself.   Misc. Comment: Ms. Frida Is a very pleasant woman. Cylee states that she is doing better and is not stressing as much as she used  to. She works daily from 12 - 8, and states that she checks her blood pressure before work and after work. Patient also states that her provider took her off of  HCTZ. Overall, Ms. Sarahmae states that she is doing okay, is not having any pain and appreciates the call.   Adherence Review: Is the patient currently on ACE/ARB medication? No Does the patient have >5 day gap between last estimated fill dates? No    Andee Poles, CMA   Star Rating Drugs: Atorvastatin 20 mg last filled 05/11/21 Losartan 100 mg last filled 07/19/21 90 DS    Andee Poles, CMA

## 2021-08-15 NOTE — Progress Notes (Signed)
Opened in error

## 2021-08-16 ENCOUNTER — Encounter: Payer: Self-pay | Admitting: Nurse Practitioner

## 2021-08-19 ENCOUNTER — Ambulatory Visit: Payer: Medicare HMO | Admitting: Nurse Practitioner

## 2021-08-27 ENCOUNTER — Ambulatory Visit: Payer: Self-pay | Admitting: *Deleted

## 2021-08-27 ENCOUNTER — Telehealth: Payer: Self-pay | Admitting: Nurse Practitioner

## 2021-08-27 NOTE — Telephone Encounter (Addendum)
Summary: Hair loss due to medication   Pt is experiencing hair loss due to her medication, best contact: [phone]         Two attempts to reach patient. Please see encounter.

## 2021-08-27 NOTE — Telephone Encounter (Signed)
Pt called to report that her seizure medication is making her hair fall out. She is going to continue taking it until she sees him later on this month. Lamotrigine she says is the name. Has not had a seizure since January.

## 2021-08-28 NOTE — Telephone Encounter (Signed)
Noted  

## 2021-08-28 NOTE — Telephone Encounter (Signed)
Routing to provider as an FYI

## 2021-09-01 DIAGNOSIS — C50112 Malignant neoplasm of central portion of left female breast: Secondary | ICD-10-CM | POA: Diagnosis not present

## 2021-09-01 DIAGNOSIS — Z4432 Encounter for fitting and adjustment of external left breast prosthesis: Secondary | ICD-10-CM | POA: Diagnosis not present

## 2021-09-01 DIAGNOSIS — Z9012 Acquired absence of left breast and nipple: Secondary | ICD-10-CM | POA: Diagnosis not present

## 2021-09-03 ENCOUNTER — Telehealth: Payer: Medicare HMO

## 2021-09-08 ENCOUNTER — Encounter: Payer: Self-pay | Admitting: Nurse Practitioner

## 2021-09-08 ENCOUNTER — Telehealth: Payer: Self-pay

## 2021-09-08 ENCOUNTER — Other Ambulatory Visit: Payer: Self-pay

## 2021-09-08 ENCOUNTER — Ambulatory Visit (INDEPENDENT_AMBULATORY_CARE_PROVIDER_SITE_OTHER): Payer: Medicare HMO | Admitting: Nurse Practitioner

## 2021-09-08 ENCOUNTER — Other Ambulatory Visit: Payer: Self-pay | Admitting: Nurse Practitioner

## 2021-09-08 VITALS — BP 147/79 | HR 69 | Ht <= 58 in | Wt 140.6 lb

## 2021-09-08 DIAGNOSIS — R569 Unspecified convulsions: Secondary | ICD-10-CM

## 2021-09-08 DIAGNOSIS — D518 Other vitamin B12 deficiency anemias: Secondary | ICD-10-CM

## 2021-09-08 DIAGNOSIS — N1832 Chronic kidney disease, stage 3b: Secondary | ICD-10-CM

## 2021-09-08 DIAGNOSIS — E7800 Pure hypercholesterolemia, unspecified: Secondary | ICD-10-CM

## 2021-09-08 DIAGNOSIS — K219 Gastro-esophageal reflux disease without esophagitis: Secondary | ICD-10-CM | POA: Diagnosis not present

## 2021-09-08 DIAGNOSIS — I10 Essential (primary) hypertension: Secondary | ICD-10-CM | POA: Diagnosis not present

## 2021-09-08 DIAGNOSIS — E049 Nontoxic goiter, unspecified: Secondary | ICD-10-CM

## 2021-09-08 DIAGNOSIS — Z0000 Encounter for general adult medical examination without abnormal findings: Secondary | ICD-10-CM

## 2021-09-08 DIAGNOSIS — R801 Persistent proteinuria, unspecified: Secondary | ICD-10-CM | POA: Insufficient documentation

## 2021-09-08 DIAGNOSIS — N2581 Secondary hyperparathyroidism of renal origin: Secondary | ICD-10-CM | POA: Diagnosis not present

## 2021-09-08 DIAGNOSIS — Z853 Personal history of malignant neoplasm of breast: Secondary | ICD-10-CM

## 2021-09-08 DIAGNOSIS — D631 Anemia in chronic kidney disease: Secondary | ICD-10-CM

## 2021-09-08 DIAGNOSIS — Z13820 Encounter for screening for osteoporosis: Secondary | ICD-10-CM

## 2021-09-08 LAB — MICROALBUMIN, URINE WAIVED
Creatinine, Urine Waived: 200 mg/dL (ref 10–300)
Microalb, Ur Waived: 80 mg/L — ABNORMAL HIGH (ref 0–19)

## 2021-09-08 MED ORDER — OMEPRAZOLE 20 MG PO CPDR: 20.0000 mg | DELAYED_RELEASE_CAPSULE | Freq: Every day | ORAL | 4 refills | Status: DC

## 2021-09-08 MED ORDER — LOSARTAN POTASSIUM 100 MG PO TABS: 100.0000 mg | ORAL_TABLET | Freq: Every day | ORAL | 4 refills | Status: DC

## 2021-09-08 NOTE — Assessment & Plan Note (Signed)
Ongoing, CKD 3.  Continue Losartan for kidney protection.  CMP and urine ALB today.  Refer back to nephrology as needed for worsening kidney function.

## 2021-09-08 NOTE — Progress Notes (Addendum)
BP (!) 147/79   Pulse 69   Ht 4' 8.5" (1.435 m)   Wt 140 lb 9.6 oz (63.8 kg)   SpO2 98%   BMI 30.97 kg/m    Subjective:    Patient ID: Kelly Poole, female    DOB: 06/01/1951, 70 y.o.   MRN: [id number]  HPI: Kelly Poole is a 70 y.o. female presenting on 09/08/2021 for comprehensive medical examination. Current medical complaints include:none  She currently lives with: self Menopausal Symptoms: no   HYPERTENSION / HYPERLIPIDEMIA Continues on Losartan 100 MG daily and Atorvastatin 20 MG daily for HLD.  Has not taken medication this morning.  Followed by neuro for past seizure issues = seen 02/21/21.  Taking Lamictal 2 tablets two times daily.  She is frustrated that this is causing hair loss.  She tried Keppra in past and this caused her to have irritability, depression, SI/HI. Goes back to neurology on 26th for follow-up. Satisfied with current treatment? yes Duration of hypertension: chronic BP monitoring frequency: daily BP range: 130/70-80 BP medication side effects: no Duration of hyperlipidemia: chronic Cholesterol medication side effects: no Cholesterol supplements: none Medication compliance: good compliance Aspirin: no Recent stressors: no Recurrent headaches: no Visual changes: no Palpitations: no Dyspnea: no Chest pain: no Lower extremity edema: no Dizzy/lightheaded: no   GERD Continues on Prilosec 20 MG daily. GERD control status: stable  Satisfied with current treatment? yes Heartburn frequency: none Medication side effects: no  Medication compliance: stable Previous GERD medications: OTC medications Antacid use frequency:  none Dysphagia: no Odynophagia:  no Hematemesis: no Blood in stool: no EGD: no  CHRONIC KIDNEY DISEASE Saw nephrology last on 08/04/21 with CRT 1.29 and eGFR 45. Taking iron daily for past anemia in CKD. CKD status: stable Medications renally dose: no Previous renal evaluation: no Pneumovax:   refused Influenza  Vaccine:  refused  VITAMIN B12 DEFICIENCY: Continues on daily supplement.  No current supplement due to past levels being above goal. Takes daily Vitamin D supplement too, no recent falls or fractures.  Is due to DEXA, she would like to obtain this.  Depression Screen done today and results listed below:  Depression screen [facility] 2/9 09/08/2021 07/02/2021 12/31/2020 10/14/2020 04/19/2020  Decreased Interest 0 0 0 0 0  Down, Depressed, Hopeless 0 0 0 0 0  PHQ - 2 Score 0 0 0 0 0  Altered sleeping - - 0 - -  Tired, decreased energy - - 0 - -  Change in appetite - - 0 - -  Feeling bad or failure about yourself  - - 0 - -  Trouble concentrating - - 0 - -  Moving slowly or fidgety/restless - - 0 - -  Suicidal thoughts - - 0 - -  PHQ-9 Score - - 0 - -    Fall Risk  09/08/2021 07/02/2021 12/31/2020 10/14/2020 02/16/2020  Falls in the past year? 0 0 0 0 0  Number falls in past yr: 0 0 - - 0  Injury with Fall? 0 0 - - 0  Risk for fall due to : No Fall Risks No Fall Risks - Medication side effect -  Follow up Education provided Falls evaluation completed - Falls evaluation completed;Education provided;Falls prevention discussed Falls evaluation completed    Functional Status Survey: Is the patient deaf or have difficulty hearing?: No Does the patient have difficulty seeing, even when wearing glasses/contacts?: No Does the patient have difficulty concentrating, remembering, or making decisions?: No Does the patient have  difficulty walking or climbing stairs?: No Does the patient have difficulty dressing or bathing?: No Does the patient have difficulty doing errands alone such as visiting a doctor's office or shopping?: No    Past Medical History:  Past Medical History:  Diagnosis Date   Breast cancer ([place]) 1991   left breast mastectomy. No chemo or rad tx   GERD (gastroesophageal reflux disease)    Heart murmur    High cholesterol    Hypertension    Seizures ([place])     Surgical History:   Past Surgical History:  Procedure Laterality Date   ABDOMINAL HYSTERECTOMY  1978   MASTECTOMY Left 1991   Due to breast cancer   TUBAL LIGATION      Medications:  Current Outpatient Medications on File Prior to Visit  Medication Sig   Ascorbic Acid (VITAMIN C) 1000 MG tablet Take 1,000 mg by mouth daily.   aspirin 81 MG tablet Take 81 mg by mouth daily.   atorvastatin (LIPITOR) 20 MG tablet Take 1 tablet (20 mg total) by mouth daily.   Cholecalciferol 50 MCG (2000 UT) TABS Take 1 tablet by mouth daily.   FEROSUL 325 (65 Fe) MG tablet TAKE 1 TABLET EVERY DAY WITH BREAKFAST   Garlic 0109 MG CAPS Take 1,000 mg by mouth daily.   lamoTRIgine (LAMICTAL) 25 MG tablet Take 2 tablets (50 mg total) by mouth 2 (two) times daily   Multiple Vitamin (MULTIVITAMINS PO) Take by mouth daily.   vitamin E 100 UNIT capsule Take 100 Units by mouth daily.   fluticasone (FLONASE) 50 MCG/ACT nasal spray Place 2 sprays into both nostrils daily for 14 days.   No current facility-administered medications on file prior to visit.    Allergies:  Allergies  Allergen Reactions   Macrobid [Nitrofurantoin Macrocrystal] Other (See Comments)    Sore throat and irritation to throat    Social History:  Social History   Socioeconomic History   Marital status: Single    Spouse name: Not on file   Number of children: Not on file   Years of education: Not on file   Highest education level: Not on file  Occupational History   Not on file  Tobacco Use   Smoking status: Never   Smokeless tobacco: Never  Vaping Use   Vaping Use: Never used  Substance and Sexual Activity   Alcohol use: No    Alcohol/week: 0.0 standard drinks   Drug use: No   Sexual activity: Yes  Other Topics Concern   Not on file  Social History Narrative   Not on file   Social Determinants of Health   Financial Resource Strain: Medium Risk   Difficulty of Paying Living Expenses: Somewhat hard  Food Insecurity: No Food Insecurity    Worried About Running Out of Food in the Last Year: Never true   Ran Out of Food in the Last Year: Never true  Transportation Needs: No Transportation Needs   Lack of Transportation (Medical): No   Lack of Transportation (Non-Medical): No  Physical Activity: Sufficiently Active   Days of Exercise per Week: 3 days   Minutes of Exercise per Session: 90 min  Stress: No Stress Concern Present   Feeling of Stress : Not at all  Social Connections: Not on file  Intimate Partner Violence: Not on file   Social History   Tobacco Use  Smoking Status Never  Smokeless Tobacco Never   Social History   Substance and Sexual Activity  Alcohol Use  No   Alcohol/week: 0.0 standard drinks    Family History:  Family History  Problem Relation Age of Onset   Stroke Mother    Thyroid disease Mother    Cancer Father    Diabetes Maternal Aunt    Breast cancer Other     Past medical history, surgical history, medications, allergies, family history and social history reviewed with patient today and changes made to appropriate areas of the chart.   Review of Systems - negative All other ROS negative except what is listed above and in the HPI.      Objective:    BP (!) 147/79   Pulse 69   Ht 4' 8.5" (1.435 m)   Wt 140 lb 9.6 oz (63.8 kg)   SpO2 98%   BMI 30.97 kg/m   Wt Readings from Last 3 Encounters:  09/08/21 140 lb 9.6 oz (63.8 kg)  07/07/21 140 lb 3.2 oz (63.6 kg)  02/24/21 140 lb 6.4 oz (63.7 kg)    Physical Exam Constitutional:      General: She is awake. She is not in acute distress.    Appearance: She is well-developed. She is not ill-appearing.  HENT:     Head: Normocephalic and atraumatic.     Right Ear: Hearing, tympanic membrane, ear canal and external ear normal. No drainage.     Left Ear: Hearing, tympanic membrane, ear canal and external ear normal. No drainage.     Nose: Nose normal.     Right Sinus: No maxillary sinus tenderness or frontal sinus tenderness.      Left Sinus: No maxillary sinus tenderness or frontal sinus tenderness.     Mouth/Throat:     Mouth: Mucous membranes are moist.     Pharynx: Oropharynx is clear. Uvula midline. No pharyngeal swelling, oropharyngeal exudate or posterior oropharyngeal erythema.  Eyes:     General: Lids are normal.        Right eye: No discharge.        Left eye: No discharge.     Extraocular Movements: Extraocular movements intact.     Conjunctiva/sclera: Conjunctivae normal.     Pupils: Pupils are equal, round, and reactive to light.     Visual Fields: Right eye visual fields normal and left eye visual fields normal.  Neck:     Thyroid: No thyromegaly.     Vascular: No carotid bruit.     Trachea: Trachea normal.  Cardiovascular:     Rate and Rhythm: Normal rate and regular rhythm.     Heart sounds: Normal heart sounds. No murmur heard.   No gallop.  Pulmonary:     Effort: Pulmonary effort is normal. No accessory muscle usage or respiratory distress.     Breath sounds: Normal breath sounds.  Chest:  Breasts:    Right: Normal.     Left: Absent.  Abdominal:     General: Bowel sounds are normal.     Palpations: Abdomen is soft. There is no hepatomegaly or splenomegaly.     Tenderness: There is no abdominal tenderness.  Musculoskeletal:        General: Normal range of motion.     Cervical back: Normal range of motion and neck supple.     Right lower leg: No edema.     Left lower leg: No edema.  Lymphadenopathy:     Head:     Right side of head: No submental, submandibular, tonsillar, preauricular or posterior auricular adenopathy.     Left side of  head: No submental, submandibular, tonsillar, preauricular or posterior auricular adenopathy.     Cervical: No cervical adenopathy.     Upper Body:     Right upper body: No supraclavicular, axillary or pectoral adenopathy.  Skin:    General: Skin is warm and dry.     Capillary Refill: Capillary refill takes less than 2 seconds.     Findings: No rash.   Neurological:     Mental Status: She is alert and oriented to person, place, and time.     Cranial Nerves: Cranial nerves are intact.     Gait: Gait is intact.     Deep Tendon Reflexes: Reflexes are normal and symmetric.     Reflex Scores:      Brachioradialis reflexes are 2+ on the right side and 2+ on the left side.      Patellar reflexes are 2+ on the right side and 2+ on the left side. Psychiatric:        Attention and Perception: Attention normal.        Mood and Affect: Mood normal.        Speech: Speech normal.        Behavior: Behavior normal. Behavior is cooperative.        Thought Content: Thought content normal.        Judgment: Judgment normal.   Results for orders placed or performed in visit on 07/07/21  PTH, Intact and Calcium  Result Value Ref Range   PTH 33 15 - 65 pg/mL   PTH Interp Comment   Comprehensive metabolic panel  Result Value Ref Range   Glucose 94 65 - 99 mg/dL   BUN 17 8 - 27 mg/dL   Creatinine, Ser 1.29 (H) 0.57 - 1.00 mg/dL   eGFR 45 (L) >59 mL/min/1.73   BUN/Creatinine Ratio 13 12 - 28   Sodium 140 134 - 144 mmol/L   Potassium 4.7 3.5 - 5.2 mmol/L   Chloride 103 96 - 106 mmol/L   CO2 23 20 - 29 mmol/L   Calcium 10.4 (H) 8.7 - 10.3 mg/dL   Total Protein 7.8 6.0 - 8.5 g/dL   Albumin 4.8 3.8 - 4.8 g/dL   Globulin, Total 3.0 1.5 - 4.5 g/dL   Albumin/Globulin Ratio 1.6 1.2 - 2.2   Bilirubin Total <0.2 0.0 - 1.2 mg/dL   Alkaline Phosphatase 111 44 - 121 IU/L   AST 19 0 - 40 IU/L   ALT 15 0 - 32 IU/L  CBC with Differential/Platelet  Result Value Ref Range   WBC 8.3 3.4 - 10.8 x10E3/uL   RBC 3.68 (L) 3.77 - 5.28 x10E6/uL   Hemoglobin 11.4 11.1 - 15.9 g/dL   Hematocrit 34.2 34.0 - 46.6 %   MCV 93 79 - 97 fL   MCH 31.0 26.6 - 33.0 pg   MCHC 33.3 31.5 - 35.7 g/dL   RDW 12.7 11.7 - 15.4 %   Platelets 327 150 - 450 x10E3/uL   Neutrophils 60 Not Estab. %   Lymphs 29 Not Estab. %   Monocytes 8 Not Estab. %   Eos 2 Not Estab. %   Basos 1 Not  Estab. %   Neutrophils Absolute 5.0 1.4 - 7.0 x10E3/uL   Lymphocytes Absolute 2.4 0.7 - 3.1 x10E3/uL   Monocytes Absolute 0.6 0.1 - 0.9 x10E3/uL   EOS (ABSOLUTE) 0.2 0.0 - 0.4 x10E3/uL   Basophils Absolute 0.1 0.0 - 0.2 x10E3/uL   Immature Granulocytes 0 Not Estab. %   Immature  Grans (Abs) 0.0 0.0 - 0.1 x10E3/uL  Lipid Panel w/o Chol/HDL Ratio  Result Value Ref Range   Cholesterol, Total 188 100 - 199 mg/dL   Triglycerides 112 0 - 149 mg/dL   HDL 55 >39 mg/dL   VLDL Cholesterol Cal 20 5 - 40 mg/dL   LDL Chol Calc (NIH) 113 (H) 0 - 99 mg/dL  Iron, TIBC and Ferritin Panel  Result Value Ref Range   Total Iron Binding Capacity 178 (L) 250 - 450 ug/dL   UIBC 92 (L) 118 - 369 ug/dL   Iron 86 27 - 139 ug/dL   Iron Saturation 48 15 - 55 %   Ferritin 328 (H) 15 - 150 ng/mL  Vitamin B12  Result Value Ref Range   Vitamin B-12 1,770 (H) 232 - 1,245 pg/mL  Levetiracetam level  Result Value Ref Range   Levetiracetam Lvl 41.5 (H) 10.0 - 40.0 ug/mL      Assessment & Plan:   Problem List Items Addressed This Visit       Cardiovascular and Mediastinum   Hypertension    Chronic, ongoing with BP slightly elevated in office today (has not taken medications this morning), but at goal on checks at home. Will continue Losartan 100 MG, has taken this in past and tolerates well.  Recommend she monitor BP at home at least as few mornings a week and document, report to provider if consistent elevation >130/80.  DASH diet focus.  CMP, TSH, and CBC today.  Return in 6 months.      Relevant Medications   losartan (COZAAR) 100 MG tablet   Other Relevant Orders   Comprehensive metabolic panel   TSH   Microalbumin, Urine Waived     Digestive   Gastroesophageal reflux disease (Chronic)    Chronic, stable on Omeprazole.  Reports return of symptoms without medication, when has trialed GDR in past.  Continue current dose and adjust as needed.  Mag level today.  Return in 6 months.      Relevant  Medications   omeprazole (PRILOSEC) 20 MG capsule   Other Relevant Orders   Magnesium     Endocrine   Thyroid goiter    History of reported by patient.  Check TSH today.      Secondary hyperparathyroidism (HCC)    Chronic, stable.  Continue to collaborate with nephrology.  PTH on labs up to date.        Genitourinary   CKD (chronic kidney disease) stage 3, GFR 30-59 ml/min (HCC)    Ongoing, CKD 3.  Continue Losartan for kidney protection.  CMP and urine ALB today.  Refer back to nephrology as needed for worsening kidney function.      Relevant Orders   Comprehensive metabolic panel     Other   HX: breast cancer    Continue yearly mammograms.      Hyperlipidemia    Chronic, ongoing.  Continue current medication regimen and adjust as needed.  Lipid panel today.      Relevant Medications   losartan (COZAAR) 100 MG tablet   Other Relevant Orders   Lipid Panel w/o Chol/HDL Ratio   Vitamin B12 deficiency (dietary) anemia    Chronic, stable.  No current supplement as levels have been stable. Check B12 level today and adjust regimen as needed.      Relevant Orders   CBC with Differential/Platelet   Vitamin B12   Anemia    Chronic, ongoing.  Continue daily iron supplement.  Recheck iron,  B12, and CBC today.  Return to hematology as needed.      Relevant Orders   CBC with Differential/Platelet   Iron, TIBC and Ferritin Panel   Seizure (HCC) - Primary    Chronic, stable at this time.  Continue to collaborate with neurology and continue current medication regimen as prescribed by them.      Relevant Medications   lamoTRIgine (LAMICTAL) 25 MG tablet   Hypercalcemia    Check CMP today, continue focus on lower calcium in diet.      Relevant Orders   Comprehensive metabolic panel   VITAMIN D 25 Hydroxy (Vit-D Deficiency, Fractures)   Other Visit Diagnoses     Encounter for annual physical exam       Annual labs today and health maintenance reviewed, she declines  vaccinations today.  Agrees to DEXA, to obtain with her mammo.        Follow up plan: Return in about 6 months (around 03/08/2022) for HTN/HLD, VIT D, SEIZURE, ANEMIA, GERD, CKD.   LABORATORY TESTING:  - Pap smear: not applicable  IMMUNIZATIONS:   - Tdap: Tetanus vaccination status reviewed: refused. - Influenza: Refused - Pneumovax: Refused - Prevnar: Refused - HPV: Not applicable - Zostavax vaccine: Refused  SCREENING: -Mammogram: Up to date -- last 09/06/20, has scheduled - Colonoscopy: Up to date  - Bone Density: Ordered today  -Hearing Test: Not applicable  -Spirometry: Not applicable   PATIENT COUNSELING:   Advised to take 1 mg of folate supplement per day if capable of pregnancy.   Sexuality: Discussed sexually transmitted diseases, partner selection, use of condoms, avoidance of unintended pregnancy  and contraceptive alternatives.   Advised to avoid cigarette smoking.  I discussed with the patient that most people either abstain from alcohol or drink within safe limits (<=14/week and <=4 drinks/occasion for males, <=7/weeks and <= 3 drinks/occasion for females) and that the risk for alcohol disorders and other health effects rises proportionally with the number of drinks per week and how often a drinker exceeds daily limits.  Discussed cessation/primary prevention of drug use and availability of treatment for abuse.   Diet: Encouraged to adjust caloric intake to maintain  or achieve ideal body weight, to reduce intake of dietary saturated fat and total fat, to limit sodium intake by avoiding high sodium foods and not adding table salt, and to maintain adequate dietary potassium and calcium preferably from fresh fruits, vegetables, and low-fat dairy products.    stressed the importance of regular exercise  Injury prevention: Discussed safety belts, safety helmets, smoke detector, smoking near bedding or upholstery.   Dental health: Discussed importance of regular  tooth brushing, flossing, and dental visits.    NEXT PREVENTATIVE PHYSICAL DUE IN 1 YEAR. Return in about 6 months (around 03/08/2022) for HTN/HLD, VIT D, SEIZURE, ANEMIA, GERD, CKD.

## 2021-09-08 NOTE — Assessment & Plan Note (Signed)
Chronic, ongoing.  Continue daily iron supplement.  Recheck iron, B12, and CBC today.  Return to hematology as needed. 

## 2021-09-08 NOTE — Assessment & Plan Note (Signed)
Chronic, ongoing with BP slightly elevated in office today (has not taken medications this morning), but at goal on checks at home. Will continue Losartan 100 MG, has taken this in past and tolerates well.  Recommend she monitor BP at home at least as few mornings a week and document, report to provider if consistent elevation >130/80.  DASH diet focus.  CMP, TSH, and CBC today.  Return in 6 months.

## 2021-09-08 NOTE — Assessment & Plan Note (Addendum)
Chronic, stable.  Continue to collaborate with nephrology.  PTH on labs up to date.

## 2021-09-08 NOTE — Assessment & Plan Note (Signed)
Chronic, stable.  No current supplement as levels have been stable. Check B12 level today and adjust regimen as needed. 

## 2021-09-08 NOTE — Assessment & Plan Note (Signed)
History of reported by patient.  Check TSH today.

## 2021-09-08 NOTE — Assessment & Plan Note (Signed)
Check CMP today, continue focus on lower calcium in diet.

## 2021-09-08 NOTE — Telephone Encounter (Signed)
Copied from [place] [id number]. Topic: General - Other >> Sep 08, 2021  3:09 PM Pawlus, Brayton Layman A wrote: Reason for CRM: Pt called in stating [facility] needs Jolene to send in a referral so she can also get a bone density test done, please advise.   Order placed. Called and notified patient.

## 2021-09-08 NOTE — Assessment & Plan Note (Signed)
Continue yearly mammograms 

## 2021-09-08 NOTE — Patient Instructions (Signed)
CALL [facility] AND ASK THEM TO ADD ON YOUR DEXA SCAN.  Bone Density Test A bone density test uses a type of X-ray to measure the amount of calcium and other minerals in a person's bones. It can measure bone density in the hip and the spine. The test is similar to having a regular X-ray. This test may also be called: Bone densitometry. Bone mineral density test. Dual-energy X-ray absorptiometry (DEXA). You may have this test to: Diagnose a condition that causes weak or thin bones (osteoporosis). Screen you for osteoporosis. Predict your risk for a broken bone (fracture). Determine how well your osteoporosis treatment is working. Tell a health care provider about: Any allergies you have. All medicines you are taking, including vitamins, herbs, eye drops, creams, and over-the-counter medicines. Any problems you or family members have had with anesthetic medicines. Any blood disorders you have. Any surgeries you have had. Any medical conditions you have. Whether you are pregnant or may be pregnant. Any medical tests you have had within the past 14 days that used contrast material. What are the risks? Generally, this is a safe test. However, it does expose you to a small amount of radiation, which can slightly increase your cancer risk. What happens before the test? Do not take any calcium supplements within the 24 hours before your test. You will need to remove all metal jewelry, eyeglasses, removable dental appliances, and any other metal objects on your body. What happens during the test?  You will lie down on an exam table. There will be an X-ray generator below you and an imaging device above you. Other devices, such as boxes or braces, may be used to position your body properly for the scan. The machine will slowly scan your body. You will need to keep very still while the machine does the scan. The images will show up on a screen in the room. Images will be examined by a specialist  after your test is finished. The procedure may vary among health care providers and hospitals. What can I expect after the test? It is up to you to get the results of your test. Ask your health care provider, or the department that is doing the test, when your results will be ready. Summary A bone density test is an imaging test that uses a type of X-ray to measure the amount of calcium and other minerals in your bones. The test may be used to diagnose or screen you for a condition that causes weak or thin bones (osteoporosis), predict your risk for a broken bone (fracture), or determine how well your osteoporosis treatment is working. Do not take any calcium supplements within 24 hours before your test. Ask your health care provider, or the department that is doing the test, when your results will be ready. This information is not intended to replace advice given to you by your health care provider. Make sure you discuss any questions you have with your health care provider. Document Revised: 05/23/2020 Document Reviewed: 05/23/2020 Elsevier Patient Education  [employer].

## 2021-09-08 NOTE — Assessment & Plan Note (Signed)
Chronic, ongoing.  Continue current medication regimen and adjust as needed. Lipid panel today. 

## 2021-09-08 NOTE — Assessment & Plan Note (Signed)
Chronic, stable at this time.  Continue to collaborate with neurology and continue current medication regimen as prescribed by them. 

## 2021-09-08 NOTE — Assessment & Plan Note (Signed)
Chronic, stable on Omeprazole.  Reports return of symptoms without medication, when has trialed GDR in past.  Continue current dose and adjust as needed.  Mag level today.  Return in 6 months.

## 2021-09-09 LAB — IRON,TIBC AND FERRITIN PANEL
Ferritin: 322 ng/mL — ABNORMAL HIGH (ref 15–150)
Iron Saturation: 28 % (ref 15–55)
Iron: 63 ug/dL (ref 27–139)
Total Iron Binding Capacity: 222 ug/dL — ABNORMAL LOW (ref 250–450)
UIBC: 159 ug/dL (ref 118–369)

## 2021-09-09 LAB — COMPREHENSIVE METABOLIC PANEL
ALT: 17 IU/L (ref 0–32)
AST: 20 IU/L (ref 0–40)
Albumin/Globulin Ratio: 1.8 (ref 1.2–2.2)
Albumin: 4.7 g/dL (ref 3.8–4.8)
Alkaline Phosphatase: 92 IU/L (ref 44–121)
BUN/Creatinine Ratio: 17 (ref 12–28)
BUN: 23 mg/dL (ref 8–27)
Bilirubin Total: 0.3 mg/dL (ref 0.0–1.2)
CO2: 19 mmol/L — ABNORMAL LOW (ref 20–29)
Calcium: 9.8 mg/dL (ref 8.7–10.3)
Chloride: 105 mmol/L (ref 96–106)
Creatinine, Ser: 1.34 mg/dL — ABNORMAL HIGH (ref 0.57–1.00)
Globulin, Total: 2.6 g/dL (ref 1.5–4.5)
Glucose: 98 mg/dL (ref 65–99)
Potassium: 4.4 mmol/L (ref 3.5–5.2)
Sodium: 142 mmol/L (ref 134–144)
Total Protein: 7.3 g/dL (ref 6.0–8.5)
eGFR: 43 mL/min/{1.73_m2} — ABNORMAL LOW (ref >59)

## 2021-09-09 LAB — LIPID PANEL W/O CHOL/HDL RATIO
Cholesterol, Total: 200 mg/dL — ABNORMAL HIGH (ref 100–199)
HDL: 52 mg/dL (ref >39)
LDL Chol Calc (NIH): 131 mg/dL — ABNORMAL HIGH (ref 0–99)
Triglycerides: 97 mg/dL (ref 0–149)
VLDL Cholesterol Cal: 17 mg/dL (ref 5–40)

## 2021-09-09 LAB — CBC WITH DIFFERENTIAL/PLATELET
Basophils Absolute: 0.1 10*3/uL (ref 0.0–0.2)
Basos: 1 %
EOS (ABSOLUTE): 0.2 10*3/uL (ref 0.0–0.4)
Eos: 3 %
Hematocrit: 30.3 % — ABNORMAL LOW (ref 34.0–46.6)
Hemoglobin: 10 g/dL — ABNORMAL LOW (ref 11.1–15.9)
Immature Grans (Abs): 0 10*3/uL (ref 0.0–0.1)
Immature Granulocytes: 0 %
Lymphocytes Absolute: 1.8 10*3/uL (ref 0.7–3.1)
Lymphs: 29 %
MCH: 30.2 pg (ref 26.6–33.0)
MCHC: 33 g/dL (ref 31.5–35.7)
MCV: 92 fL (ref 79–97)
Monocytes Absolute: 0.6 10*3/uL (ref 0.1–0.9)
Monocytes: 9 %
Neutrophils Absolute: 3.5 10*3/uL (ref 1.4–7.0)
Neutrophils: 58 %
Platelets: 285 10*3/uL (ref 150–450)
RBC: 3.31 x10E6/uL — ABNORMAL LOW (ref 3.77–5.28)
RDW: 12.8 % (ref 11.7–15.4)
WBC: 6.1 10*3/uL (ref 3.4–10.8)

## 2021-09-09 LAB — MAGNESIUM: Magnesium: 1.9 mg/dL (ref 1.6–2.3)

## 2021-09-09 LAB — VITAMIN D 25 HYDROXY (VIT D DEFICIENCY, FRACTURES): Vit D, 25-Hydroxy: 63.7 ng/mL (ref 30.0–100.0)

## 2021-09-09 LAB — TSH: TSH: 2.74 u[IU]/mL (ref 0.450–4.500)

## 2021-09-09 LAB — VITAMIN B12: Vitamin B-12: 1062 pg/mL (ref 232–1245)

## 2021-09-09 NOTE — Progress Notes (Signed)
Contacted via [place] afternoon Kelly Poole, your labs have returned.  Your CBC does show some ongoing anemia, but improved from recent visits.  Kidney disease still present, but stable and no worsening, we will continue to monitor this closely.  Thyroid and Vitamin D normal.  Iron and B12 normal.  Cholesterol levels a bit above goal, are you taking Atorvastatin daily?  Any questions for me on these? Keep being amazing!!  Thank you for allowing me to participate in your care.  I appreciate you. Kindest regards, [place]

## 2021-09-10 ENCOUNTER — Other Ambulatory Visit: Payer: Self-pay | Admitting: Nurse Practitioner

## 2021-09-10 MED ORDER — ATORVASTATIN CALCIUM 40 MG PO TABS: 40.0000 mg | ORAL_TABLET | Freq: Every day | ORAL | 4 refills | Status: DC

## 2021-09-10 MED ORDER — ATORVASTATIN CALCIUM 40 MG PO TABS: 40.0000 mg | ORAL_TABLET | Freq: Every day | ORAL | 3 refills | Status: DC

## 2021-09-15 ENCOUNTER — Ambulatory Visit (INDEPENDENT_AMBULATORY_CARE_PROVIDER_SITE_OTHER): Payer: Medicare HMO | Admitting: Licensed Clinical Social Worker

## 2021-09-15 DIAGNOSIS — R569 Unspecified convulsions: Secondary | ICD-10-CM

## 2021-09-15 DIAGNOSIS — E7800 Pure hypercholesterolemia, unspecified: Secondary | ICD-10-CM

## 2021-09-15 DIAGNOSIS — I10 Essential (primary) hypertension: Secondary | ICD-10-CM

## 2021-09-15 DIAGNOSIS — Z5941 Food insecurity: Secondary | ICD-10-CM

## 2021-09-15 DIAGNOSIS — F5101 Primary insomnia: Secondary | ICD-10-CM

## 2021-09-15 NOTE — Patient Instructions (Signed)
Visit Information   Goals Addressed             This Visit's Progress    SW-Track and Manage My Symptoms-Depression   On track    Timeframe:  Long-Range Goal Priority:  Medium Start Date: 02/10/21                            Expected End Date:  12/20/21                     Follow Up Date- 11/24/21   Patient Self Activities/Goals: Continue utilizing healthy coping skills discussed Follow up with PCP regarding request for assistance with hair loss and scheduled appointments Practice positive self-talk to promote positive mood        Patient verbalizes understanding of instructions provided today and agrees to view in [place].   Telephone follow up appointment with care management team member scheduled for:11/24/21  Christa See, MSW, [email or web address] Phone [phone] 5:27 PM

## 2021-09-15 NOTE — Chronic Care Management (AMB) (Signed)
Chronic Care Management    Clinical Social Work Note  09/15/2021 Name: Kelly Poole MRN: [id number] DOB: January 30, 1951  Kelly Poole is a 70 y.o. year old female who is a primary care patient of Cannady, Barbaraann Faster, NP. The CCM team was consulted to assist the patient with chronic disease management and/or care coordination needs related to: Mental Health Counseling and Resources and Caregiver Stress.   Engaged with patient by telephone for follow up visit in response to provider referral for social work chronic care management and care coordination services.   Consent to Services:  The patient was given information about Chronic Care Management services, agreed to services, and gave verbal consent prior to initiation of services.  Please see initial visit note for detailed documentation.   Patient agreed to services and consent obtained.   Consent to Services:  The patient was given information about Care Management services, agreed to services, and gave verbal consent prior to initiation of services.  Please see initial visit note for detailed documentation.   Patient agreed to services today and consent obtained.   Assessment: Engaged with Patient by phone in response to provider referral for social work care coordination services: [place] and Resources and Caregiver Stress.    Patient continues to maintain positive progress with care plan goals. She reports symptoms of depression, irritability, and SI has resolved with discontinuing Keppra medication. Patient reports food insecurity and CCM LCSW completed referral to Care Guide with assistance. See Care Plan below for interventions and patient self-care activities.  Recent life changes or stressors: Food insecurity  Recommendation: Patient may benefit from, and is in agreement work with LCSW to address care coordination needs and will continue to work with the clinical team to address health care and disease  management related needs.   Follow up Plan: Patient would like continued follow-up from CCM LCSW .  per patient's request will follow up in 11/24/21.  Will call office if needed prior to next encounter.    SDOH (Social Determinants of Health) assessments and interventions performed:  Food Insecurity  Advanced Directives Status: Not addressed in this encounter.  CCM Care Plan  Allergies  Allergen Reactions   Macrobid [Nitrofurantoin Macrocrystal] Other (See Comments)    Sore throat and irritation to throat    Outpatient Encounter Medications as of 09/15/2021  Medication Sig Note   Ascorbic Acid (VITAMIN C) 1000 MG tablet Take 1,000 mg by mouth daily.    aspirin 81 MG tablet Take 81 mg by mouth daily.    atorvastatin (LIPITOR) 40 MG tablet Take 1 tablet (40 mg total) by mouth daily.    Cholecalciferol 50 MCG (2000 UT) TABS Take 1 tablet by mouth daily.    FEROSUL 325 (65 Fe) MG tablet TAKE 1 TABLET EVERY DAY WITH BREAKFAST    fluticasone (FLONASE) 50 MCG/ACT nasal spray Place 2 sprays into both nostrils daily for 14 days.    Garlic 4944 MG CAPS Take 1,000 mg by mouth daily. 10/12/2019: Doesn't take regular   lamoTRIgine (LAMICTAL) 25 MG tablet Take 2 tablets (50 mg total) by mouth 2 (two) times daily    losartan (COZAAR) 100 MG tablet Take 1 tablet (100 mg total) by mouth daily.    Multiple Vitamin (MULTIVITAMINS PO) Take by mouth daily.    omeprazole (PRILOSEC) 20 MG capsule Take 1 capsule (20 mg total) by mouth daily.    vitamin E 100 UNIT capsule Take 100 Units by mouth daily.    No  facility-administered encounter medications on file as of 09/15/2021.    Patient Active Problem List   Diagnosis Date Noted   Persistent proteinuria 09/08/2021   Hypercalcemia 08/04/2021   Secondary hyperparathyroidism ([place]) 12/23/2020   Seizure ([place]) 12/23/2020   Anemia 07/30/2020   Osteoarthritis of right knee 09/07/2019   Vasomotor symptoms due to menopause 03/24/2019   CKD (chronic kidney  disease) stage 3, GFR 30-59 ml/min (HCC) 10/05/2016   Vitamin B12 deficiency (dietary) anemia 12/27/2015   Hypertension 12/27/2015   Insomnia 06/03/2015   Thyroid goiter 06/03/2015   HX: breast cancer 06/03/2015   Gastroesophageal reflux disease 06/03/2015   Hyperlipidemia 06/03/2015    Conditions to be addressed/monitored: ; Limited access to food and Mental Health Concerns   Care Plan : General Social Work (Adult)  Updates made by Rebekah Chesterfield, LCSW since 09/15/2021 12:00 AM     Problem: Quality of Life (General Plan of Care)      Long-Range Goal: Quality of Life Maintained   Start Date: 02/10/2021  This Visit's Progress: On track  Recent Progress: On track  Priority: Medium  Note:   Timeframe:  Long-Range Goal Priority:  Medium Start Date: 02/10/21                            Expected End Date:  12/20/21                     Follow Up Date- 11/24/21   Current barriers:   Patient is having depressive symptoms due to losing independence as she is unable to drive right now  Unable to consistently perform activities of daily living and needs additional assistance / support in order to meet this unmet need Unable to  independently self manage needs related to chronic health conditions.  Knowledge deficits related to short term plan for care coordination needs and long term plans for chronic disease management needs Clinical Goals: Over the next 120 days, patient will work with SW to address concerns related to stress management, increasing self care and coping with being unable to drive at this time Interventions : Assessed needs, level of care concerns, and barriers to care Patient reports decrease in symptoms of  depression, irritability, and suicidal ideations with the discontinuation of medication (Keppra) Patient reports an increase in stress triggered by recent interaction with ex-partner who is having difficulty respecting boundaries  CCM LCSW provided validation and  encouragement. Strategies to assist in promoting healthy boundaries were discussed to promote stress management Patient continues to care for mother with diagnosis of dementia. This has resulted in patient's difficulty managing blood pressure due to stress from staying at mother's residence for five days a week Patient reports no recent seizures. She has been cleared to begin driving again and is excited about indepence Patient continues to receive strong support from family (son and brother) Patient is interested in applying for disability. CCM LCSW discussed process and provided web-site where she can apply ([email or web address] Patient's questions regarding process were answered 09/26: Patient has decided not to apply for disability at this time. States that she receives payment from [employer] to continue providing care for her mother Patient reports that she has applied for SNAP benefits; however was offered only $10. Patient reports food insecurity. CCM LCSW completed referral to Care Guide for assistance with local resources and/or gift card CCM LCSW reviewed upcoming appointments with patient. Patient provided verbal consent for CCM LCSW to cancel appt  for 09/29 with PCP Patient denies need for any additional resources at this time CCM LCSW received an incoming call from patient at 4:53 PM-Patient reports that the [place] is increasing anxiety symptoms. This has been ongoing noting that it was worse at beginning of the year. Per patient, "I'm better now" States medication makes her irritable, has difficulty sleeping, and increases thoughts of wanting to harm someone else "the devil was in me" Currently she is taking medication 2 x daily. Patient reports that she has not had any seizures since January Discussed plans with patient for ongoing care management follow up and provided patient with direct contact information for care management team Other interventions provided: Solution-Focused Strategies and  Emotional/Supportive Counseling Collaboration with PCP regarding development and update of comprehensive plan of care as evidenced by provider attestation and co-signature Inter-disciplinary care team collaboration (see longitudinal plan of care) Patient Self Activities/Goals: Continue utilizing healthy coping skills discussed Follow up with PCP regarding request for assistance with hair loss and scheduled appointments Practice positive self-talk to promote positive mood      Christa See, MSW, [email or web address] Phone [phone] 5:24 PM

## 2021-09-16 ENCOUNTER — Telehealth: Payer: Self-pay | Admitting: Nurse Practitioner

## 2021-09-16 NOTE — Telephone Encounter (Signed)
-----   Message from Venita Lick, NP sent at 09/12/2021  4:58 PM EDT ----- Just seen for physical -- can cancel 09/18/21 visit -- can come in for pneumonia vaccine for nurse visit if she wishes that.  Thanks

## 2021-09-16 NOTE — Telephone Encounter (Signed)
Patient appointment has been cancelled for 9/29 and does not want pneumonia vaccine at this time.

## 2021-09-17 ENCOUNTER — Ambulatory Visit: Payer: Self-pay

## 2021-09-17 ENCOUNTER — Telehealth: Payer: Medicare HMO | Admitting: General Practice

## 2021-09-17 ENCOUNTER — Telehealth: Payer: Self-pay

## 2021-09-17 DIAGNOSIS — N1832 Chronic kidney disease, stage 3b: Secondary | ICD-10-CM

## 2021-09-17 DIAGNOSIS — I10 Essential (primary) hypertension: Secondary | ICD-10-CM

## 2021-09-17 DIAGNOSIS — E7800 Pure hypercholesterolemia, unspecified: Secondary | ICD-10-CM

## 2021-09-17 DIAGNOSIS — R569 Unspecified convulsions: Secondary | ICD-10-CM

## 2021-09-17 DIAGNOSIS — Z5941 Food insecurity: Secondary | ICD-10-CM

## 2021-09-17 NOTE — Chronic Care Management (AMB) (Signed)
Chronic Care Management   CCM RN Visit Note  09/17/2021 Name: Kelly Poole MRN: [id number] DOB: 07/11/1951  Subjective: Kelly Poole is a 70 y.o. year old female who is a primary care patient of Cannady, Barbaraann Faster, NP. The care management team was consulted for assistance with disease management and care coordination needs.    Engaged with patient by telephone for follow up visit in response to provider referral for case management and/or care coordination services.   Consent to Services:  The patient was given information about Chronic Care Management services, agreed to services, and gave verbal consent prior to initiation of services.  Please see initial visit note for detailed documentation.   Patient agreed to services and verbal consent obtained.   Assessment: Review of patient past medical history, allergies, medications, health status, including review of consultants reports, laboratory and other test data, was performed as part of comprehensive evaluation and provision of chronic care management services.   SDOH (Social Determinants of Health) assessments and interventions performed:  SDOH Interventions    Flowsheet Row Most Recent Value  SDOH Interventions   Food Insecurity Interventions Other (Comment)  [has received help from charity care before. Asking for assistance with charity care/food voucher]  Financial Strain Interventions Other (Comment)  [referral to care guide for assistance]  Housing Interventions Intervention Not Indicated  Intimate Partner Violence Interventions Intervention Not Indicated  Physical Activity Interventions Intervention Not Indicated  Social Connections Interventions Intervention Not Indicated  Transportation Interventions Intervention Not Indicated        CCM Care Plan  Allergies  Allergen Reactions   Macrobid [Nitrofurantoin Macrocrystal] Other (See Comments)    Sore throat and irritation to throat    Outpatient Encounter  Medications as of 09/17/2021  Medication Sig Note   Ascorbic Acid (VITAMIN C) 1000 MG tablet Take 1,000 mg by mouth daily.    aspirin 81 MG tablet Take 81 mg by mouth daily.    atorvastatin (LIPITOR) 40 MG tablet Take 1 tablet (40 mg total) by mouth daily.    Cholecalciferol 50 MCG (2000 UT) TABS Take 1 tablet by mouth daily.    FEROSUL 325 (65 Fe) MG tablet TAKE 1 TABLET EVERY DAY WITH BREAKFAST    fluticasone (FLONASE) 50 MCG/ACT nasal spray Place 2 sprays into both nostrils daily for 14 days.    Garlic 0277 MG CAPS Take 1,000 mg by mouth daily. 10/12/2019: Doesn't take regular   lamoTRIgine (LAMICTAL) 25 MG tablet Take 2 tablets (50 mg total) by mouth 2 (two) times daily    losartan (COZAAR) 100 MG tablet Take 1 tablet (100 mg total) by mouth daily.    Multiple Vitamin (MULTIVITAMINS PO) Take by mouth daily.    omeprazole (PRILOSEC) 20 MG capsule Take 1 capsule (20 mg total) by mouth daily.    vitamin E 100 UNIT capsule Take 100 Units by mouth daily.    No facility-administered encounter medications on file as of 09/17/2021.    Patient Active Problem List   Diagnosis Date Noted   Persistent proteinuria 09/08/2021   Hypercalcemia 08/04/2021   Secondary hyperparathyroidism ([name]) 12/23/2020   Seizure ([name]) 12/23/2020   Anemia 07/30/2020   Osteoarthritis of right knee 09/07/2019   Vasomotor symptoms due to menopause 03/24/2019   CKD (chronic kidney disease) stage 3, GFR 30-59 ml/min (HCC) 10/05/2016   Vitamin B12 deficiency (dietary) anemia 12/27/2015   Hypertension 12/27/2015   Insomnia 06/03/2015   Thyroid goiter 06/03/2015   HX: breast cancer 06/03/2015  Gastroesophageal reflux disease 06/03/2015   Hyperlipidemia 06/03/2015    Conditions to be addressed/monitored:HTN, HLD, CKD Stage 3, and seizures  Care Plan : RNCM: Chronic Kidney (Adult)  Updates made by Vanita Ingles, RN since 09/17/2021 12:00 AM     Problem: RNCM: Adjustment to Chronic Kidney Disease   Priority:  Medium     Long-Range Goal: RNCM: CKD3 Management   Start Date: 02/07/2021  Expected End Date: 06/10/2022  This Visit's Progress: On track  Recent Progress: On track  Priority: Medium  Note:   Current Barriers:  Knowledge Deficits related to resources in the community for management of CKD3 and chronic conditions  Chronic Disease Management support and education needs related to effective management of CKD3 Lacks caregiver support.  Non-adherence to prescribed medication regimen Does not adhere to prescribed medication regimen Lacks social connections Does not contact provider office for questions/concerns  Nurse Case Manager Clinical Goal(s):  patient will verbalize understanding of plan for effective management of CKD3 patient will work with [facility] and pcp  to address needs related to CKD3 patient will attend all scheduled medical appointments: 03-09-2022, has upcoming appointments with specialist  patient will demonstrate improved health management independence as evidenced bylabs stable with no decline, adherence to dietary restrictions, adherence to medications, and working with the CCM team to optimize health and well being   Interventions:  1:1 collaboration with Venita Lick, NP regarding development and update of comprehensive plan of care as evidenced by provider attestation and co-signature Inter-disciplinary care team collaboration (see longitudinal plan of care) Evaluation of current treatment plan related to CKD3 and patient's adherence to plan as established by provider. 03-28-2021: The patient saw the pcp on 3-7 and had blood work. Hydrochlorothiazide discontinued after lab work. The patient continues to see kidney specialist on a regular basis. The patient is following a renal diet and monitoring her food content. Will continue to monitor. 06-18-2021: Patient continues to be managing well with established CKD plan. 09-17-2021: The patient saw pcp on 08-22-2021 and had blood  work. Her kidney function is stable. She denies any new issues with her CKD and is happy her labs are stable. She is staying hydrated and monitoring her dietary intake well. Will continue to monitor for changes.  Advised patient to call the office for questions or concerns Provided education to patient re: following heart healthy renal diet. 03-28-2021: Is eating well. Denies any issues with dietary restrictions. 06-18-2021: Patient eating well and states she eats a lot of fruits and vegetables. 09-17-2021: The patient eats well and denies any issues with dietary restrictions. She did ask for another food voucher and a care guide referral has been placed. She has received assistance from the charity care before. Will continue to monitor for changes.  Reviewed medications with patient and discussed compliance. The patient states she has her medications and is taking as prescribed.  09-17-2021:  Patient continues to be compliant with all her medications. Reviewed scheduled/upcoming provider appointments including: 03-09-2021 with the pcp  Discussed plans with patient for ongoing care management follow up and provided patient with direct contact information for care management team  Patient Goals/Self-Care Activities patient will:  - Patient will self administer medications as prescribed Patient will attend all scheduled provider appointments Patient will call pharmacy for medication refills Patient will attend church or other social activities Patient will continue to perform ADL's independently Patient will continue to perform IADL's independently Patient will call provider office for new concerns or questions Patient will  work with BSW to address care coordination needs and will continue to work with the clinical team to address health care and disease management related needs.   - decision-making supported - depression screen reviewed - goal setting facilitated - positive reinforcement provided -  problem-solving facilitated - relaxation techniques promoted - self-care encouraged - self-reflection promoted - verbalization of feelings encouraged  Follow Up Plan: Telephone follow up appointment with care management team member scheduled for: 12-03-2021 at 0900 am        Task: RNCM: Support Psychological Response to Chronic Kidney Disease Completed 09/17/2021  Outcome: Positive  Note:   Care Management Activities:    - decision-making supported - depression screen reviewed - goal setting facilitated - positive reinforcement provided - problem-solving facilitated - relaxation techniques promoted - self-care encouraged - self-reflection promoted - verbalization of feelings encouraged        Care Plan : RNCM: Hypertension (Adult)  Updates made by Vanita Ingles, RN since 09/17/2021 12:00 AM     Problem: RNCM: Hypertension (Hypertension)   Priority: Medium     Long-Range Goal: RNCM: Hypertension Monitored   Start Date: 02/07/2021  Expected End Date: 06/09/2022  This Visit's Progress: On track  Recent Progress: On track  Priority: Medium  Note:   Objective:  Last practice recorded BP readings:  BP Readings from Last 3 Encounters:  09/08/21 (!) 147/79  07/07/21 118/78  07/02/21 (!) 166/89     Most recent eGFR/CrCl: No results found for: EGFR  No components found for: CRCL Current Barriers:  Knowledge Deficits related to basic understanding of hypertension pathophysiology and self care management Knowledge Deficits related to understanding of medications prescribed for management of hypertension Non-adherence to prescribed medication regimen- was without her medications for HTN in January x 1 week  Limited Social Support Unable to self administer medications as prescribed Does not adhere to prescribed medication regimen Lacks social connections  Case Manager Clinical Goal(s):   patient will verbalize understanding of plan for hypertension management  patient  will attend all scheduled medical appointments: 03-09-2022  patient will demonstrate improved adherence to prescribed treatment plan for hypertension as evidenced by taking all medications as prescribed, monitoring and recording blood pressure as directed, adhering to low sodium/DASH diet  patient will demonstrate improved health management independence as evidenced by checking blood pressure as directed and notifying PCP if SBP>160 or DBP > 90, taking all medications as prescribe, and adhering to a low sodium diet as discussed.  patient will verbalize basic understanding of hypertension disease process and self health management plan as evidenced by compliance with medications, dietary restrictions, and working with the CCM team to optimize health and well being  Interventions:  Collaboration with Venita Lick, NP regarding development and update of comprehensive plan of care as evidenced by provider attestation and co-signature Inter-disciplinary care team collaboration (see longitudinal plan of care) Evaluation of current treatment plan related to hypertension self management and patient's adherence to plan as established by provider. 03-28-2021: Saw the provider recently and got a good report. She was taken off of HCTZ and is doing well. States blood pressure is up and down depending on her activity level but has not reached 841 systolic. Review of normal parameters. 09-17-2021:  Patient monitoring blood pressure at home, denies any variations from normal. The patient will continue to monitor and call for any changes in condition. States compliance with medications and dietary restrictions.  Provided education to patient re: stroke prevention, s/s of heart attack and  stroke, DASH diet, complications of uncontrolled blood pressure. 09-17-2021: Review with the patient.  Reviewed medications with patient and discussed importance of compliance.  03-28-2021: The patient reports compliance with medications.  09-17-2021: Patient continues to be compliant with all medications. Discussed plans with patient for ongoing care management follow up and provided patient with direct contact information for care management team Advised patient, providing education and rationale, to monitor blood pressure daily and record, calling PCP for findings outside established parameters.  Reviewed scheduled/upcoming provider appointments including:  03-09-2022 Patient Goals/Self-Care Activities  patient will:  - Self administers medications as prescribed Attends all scheduled provider appointments Calls provider office for new concerns, questions, or BP outside discussed parameters Checks BP and records as discussed- 09-17-2021: Endorses checking blood pressure regularly  Follows a low sodium diet/DASH diet - blood pressure trends reviewed - depression screen reviewed - home or ambulatory blood pressure monitoring encouraged Follow Up Plan: Telephone follow up appointment with care management team member scheduled for: 12-03-2021 at 0900 am    Task: RNCM: Identify and Monitor Blood Pressure Elevation Completed 09/17/2021  Outcome: Positive  Note:   Care Management Activities:    - blood pressure trends reviewed - depression screen reviewed - home or ambulatory blood pressure monitoring encouraged        Care Plan : RNCM: Seizures- new onset  Updates made by Vanita Ingles, RN since 09/17/2021 12:00 AM     Problem: RNCM: Health Promotion or Disease Self-Management (General Plan of Care)   Priority: High     Long-Range Goal: RNCM: Seizures management   Start Date: 02/07/2021  Expected End Date: 09/17/2022  This Visit's Progress: On track  Recent Progress: On track  Priority: High  Note:   Current Barriers:  Knowledge Deficits related to resources available for help with needs post seizure activity  Chronic Disease Management support and education needs related to new onset of witnessed seizure in January  2022 Lacks caregiver support.  Transportation barriers- patient cannot drive at this time, says she has adequate transportation, knows there are resources available if needed for transportation. Lacks social connections Does not contact provider office for questions/concerns  Nurse Case Manager Clinical Goal(s):  patient will verbalize understanding of plan for effective management of seizures  patient will work with RNCM, pcp, and specialist  to address needs related to seizures  patient will attend all scheduled medical appointments: 03-09-2022 with the pcp, is calling to get an appointment for follow up with the neurologist because current medication she is taking is making her hair fall out.  patient will demonstrate improved adherence to prescribed treatment plan for seizures as evidenced bymedication compliance, no new seizure activity, and working with the CCM team to manage health and well being for chronic conditions  Interventions:  1:1 collaboration with Venita Lick, NP regarding development and update of comprehensive plan of care as evidenced by provider attestation and co-signature Inter-disciplinary care team collaboration (see longitudinal plan of care) Evaluation of current treatment plan related to seizures  and patient's adherence to plan as established by provider. 03-28-2021: The patient is doing well and denies any new seizures. She states the medications she is taking are keeping her appetite good. She does not see the specialist again until September. She is hopeful that she will not have any more seizures so she can go back to driving after July 3.  Repeat MRI shows the spot seen on her brain is gone per the patient.  Will continue to  monitor. 09-17-2021:  Patient reports no further seizures since January 2022. She does state the Lamictal she is taking is making her hair fall out. She states she had to change from the other medication they had her on because it was causing  her to have suicidal thoughts and it really scared her. Encouraged the patient to call the neurologist for a follow up appointment and to discuss treatment options.  Advised patient to call the office for changes or questions Provided education to patient re: taking medications as ordered, calling when medications are not there, adherence to prescribed diet, monitoring for new onset of seizure activity, driving restrictions. 03-28-2021: Review of driving restrictions, taking medications as prescribed and not missing doses, adequate sleep and rest, pacing activities. The patient has returned to the gym and feels great. Denies any issues with transportation at this time. 09-17-2021: Doing well and active. Does want to discuss medication changes with neurologist due to the Lamictal causing her hair to fall out. She denies any acute distress but states she does need to address this with her provider. Will continue to monitor.   Reviewed medications with patient and discussed compliance, the patient is taking Keppra and no issues noted at this time.  03-28-2021: The patient states compliance with medications regimen. 06-18-2021:  Patient remains on Keppra 535m 1 tab BID and is compliant with all medications. 09-17-2021: The [place] and [place] thoughts. The patient states the medications made  her feel like she was "crazy". She is on Lamictal now but it is causing her hair to fall out. Encouraged the patient to call and get an appointment for follow up with the neurologist.  Reviewed scheduled/upcoming provider appointments including: 03-09-2022 with pcp, is calling to get a sooner appointment with the neurologist  Discussed plans with patient for ongoing care management follow up and provided patient with direct contact information for care management team  Patient Goals/Self-Care Activities  patient will:  - Patient will self administer medications as  prescribed Patient will attend all scheduled provider appointments Patient will call pharmacy for medication refills Patient will attend church or other social activities Patient will continue to perform ADL's independently Patient will continue to perform IADL's independently Patient will call provider office for new concerns or questions Patient will work with BSW to address care coordination needs and will continue to work with the clinical team to address health care and disease management related needs.   - barriers to meeting goals identified - change-talk evoked - choices provided - collaboration with team encouraged - decision-making supported - difficulty of making life-long changes acknowledged - health risks reviewed - problem-solving facilitated - questions answered - readiness for change evaluated - reassurance provided - resources needed to meet goals identified. 03-28-2021: The patient did inquire about food resources. The patient has received gift cards x 2 within the last 6 months for food and the [employer] Explained limitations and the request previously for food resources. The patient verbalized that she would wait to talk to the care guides about food resources that she had what she needed at this time. Will continue to monitor. 09-17-2021: The patient ask about food resources and a voucher on today's call. The patient advised that a care guide referral will be placed. The patient has utilized the resources several times in the past. She is aware this may not be an option for her but is interested in food resources. Will continue to  monitor for changes.  - self-reflection promoted - self-reliance encouraged - verbalization of feelings encouraged  Follow Up Plan: Telephone follow up appointment with care management team member scheduled for: 12-03-2021 at 0900 am        Care Plan : RNCM: HLD Management  Updates made by Vanita Ingles, RN since 09/17/2021  12:00 AM     Problem: RNCM: HLD Management   Priority: High  Onset Date: 09/17/2021     Goal: Patient-Specific Goal   Start Date: 09/17/2021  Expected End Date: 09/17/2022  This Visit's Progress: On track  Priority: High  Note:   Current Barriers:  Poorly controlled hyperlipidemia, complicated by HTN, CKD Current antihyperlipidemic regimen: Atorvastatin 40 mg QD, recently increased from 20 to 40 Most recent lipid panel:     Component Value Date/Time   CHOL 200 (H) 09/08/2021 0816   CHOL 178 06/03/2015 1127   TRIG 97 09/08/2021 0816   TRIG 93 06/03/2015 1127   HDL 52 09/08/2021 0816   CHOLHDL 2.8 08/23/2018 1635   VLDL 19 06/03/2015 1127   LDLCALC 131 (H) 09/08/2021 0816   ASCVD risk enhancing conditions: age >68,  HTN, CKD Unable to independently manage HLD  RN Care Manager Clinical Goal(s):  patient will work with Consulting civil engineer, providers, and care team towards execution of optimized self-health management plan patient will verbalize understanding of plan for effective management of HLD  patient will work with [facility], CCM team and pcp  to address needs related to HLD  patient will demonstrate a decrease in HLD  exacerbations patient will take all medications exactly as prescribed and will call provider for medication related questions patient will attend all scheduled medical appointments: 03-09-2022 patient will demonstrate improved health management independence patient will verbalize basic understanding of HLD  disease process and self health management plan patient will demonstrate understanding of rationale for each prescribed medication the patient will demonstrate ongoing self health care management ability Interventions: Collaboration with Venita Lick, NP regarding development and update of comprehensive plan of care as evidenced by provider attestation and co-signature Inter-disciplinary care team collaboration (see longitudinal plan of care) Medication review  performed; medication list updated in electronic medical record.  Inter-disciplinary care team collaboration (see longitudinal plan of care) Referred to pharmacy team for assistance with HLD medication management Evaluation of current treatment plan related to HLD  and patient's adherence to plan as established by provider. Advised patient to call the office for changes in conditions, questions, or concerns Provided education to patient re: heart healthy diet and monitoring for foods high in cholesterol  Reviewed medications with patient and discussed compliance  Reviewed scheduled/upcoming provider appointments including: 03-09-2022 Discussed plans with patient for ongoing care management follow up and provided patient with direct contact information for care management team Patient Goals/Self-Care Activities: - call for medicine refill 2 or 3 days before it runs out - call if I am sick and can't take my medicine - keep a list of all the medicines I take; vitamins and herbals too - learn to read medicine labels - use a pillbox to sort medicine - use an alarm clock or phone to remind me to take my medicine - change to whole grain breads, cereal, pasta - drink 6 to 8 glasses of water each day - eat 3 to 5 servings of fruits and vegetables each day - eat 5 or 6 small meals each day - eat fish at least once per week - fill half the plate  with nonstarchy vegetables - limit fast food meals to no more than 1 per week - manage portion size - prepare main meal at home 3 to 5 days each week - read food labels for fat, fiber, carbohydrates and portion size - reduce red meat to 2 to 3 times a week - be open to making changes - I can manage, know and watch for signs of a heart attack - if I have chest pain, call for help - learn about small changes that will make a big difference - learn my personal risk factors  Follow Up Plan: Telephone follow up appointment with care management team member  scheduled for: 12-03-2021 at 0900 am       Plan:Telephone follow up appointment with care management team member scheduled for:  12-03-2021 at 0900 am  Noreene Larsson RN, MSN, [facility] Mobile: [phone]

## 2021-09-17 NOTE — Telephone Encounter (Signed)
  Care Management   Follow Up Note   09/17/2021 Name: Kelly Poole MRN: [id number] DOB: 1951-03-30   Referred by: Venita Lick, NP Reason for referral : Chronic Care Management (RNCM: Follow up for Chronic Disease Management and Care Coordination Needs )   Incoming call form the patient and the call was completed. See new encounter for information.   Follow Up Plan: The care management team will reach out to the patient again over the next 30 to 60 days.   Noreene Larsson RN, MSN, [facility] Mobile: [phone]

## 2021-09-17 NOTE — Patient Instructions (Signed)
Visit Information  PATIENT GOALS:  Goals Addressed             This Visit's Progress    RNCM: Lifestyle Change-Hypertension       Timeframe:  Long-Range Goal Priority:  High Start Date:       09-17-2021                      Expected End Date:       09-17-2022                Follow Up Date 11/26/2021    - agree on reward when goals are met - agree to work together to make changes - ask questions to understand - have a family meeting to talk about healthy habits - learn about high blood pressure    Why is this important?   The changes that you are asked to make may be hard to do.  This is especially true when the changes are life-long.  Knowing why it is important to you is the first step.  Working on the change with your family or support person helps you not feel alone.  Reward yourself and family or support person when goals are met. This can be an activity you choose like bowling, hiking, biking, swimming or shooting hoops.     09-17-2021: The patient states that her blood pressures are good. The pcp increased her atorvastatin  to 40 mg daily and the patient is tolerating well. Review of dietary restrictions. Will continue to monitor.         Patient verbalizes understanding of instructions provided today and agrees to view in [place].   Telephone follow up appointment with care management team member scheduled for: 12-03-2021 at 0900 am  Noreene Larsson RN, MSN, [facility] Mobile: [phone]

## 2021-09-18 ENCOUNTER — Ambulatory Visit: Payer: Medicare HMO | Admitting: Nurse Practitioner

## 2021-09-18 DIAGNOSIS — R569 Unspecified convulsions: Secondary | ICD-10-CM | POA: Diagnosis not present

## 2021-09-19 DIAGNOSIS — E7800 Pure hypercholesterolemia, unspecified: Secondary | ICD-10-CM

## 2021-09-19 DIAGNOSIS — I10 Essential (primary) hypertension: Secondary | ICD-10-CM | POA: Diagnosis not present

## 2021-09-22 ENCOUNTER — Ambulatory Visit (INDEPENDENT_AMBULATORY_CARE_PROVIDER_SITE_OTHER): Payer: Medicare HMO

## 2021-09-22 DIAGNOSIS — I10 Essential (primary) hypertension: Secondary | ICD-10-CM

## 2021-09-22 DIAGNOSIS — E7800 Pure hypercholesterolemia, unspecified: Secondary | ICD-10-CM

## 2021-09-22 NOTE — Progress Notes (Signed)
Chronic Care Management Pharmacy Note  09/22/2021 Name:  Kelly Poole MRN:  [id number] DOB:  1951/06/23  Summary: No Rx changes  Subjective: Kelly Poole is an 70 y.o. year old female who is a primary patient of Cannady, Barbaraann Faster, NP.  The CCM team was consulted for assistance with disease management and care coordination needs.    Engaged with patient by telephone for follow up visit in response to provider referral for pharmacy case management and/or care coordination services.   Consent to Services:  The patient was given information about Chronic Care Management services, agreed to services, and gave verbal consent prior to initiation of services.  Please see initial visit note for detailed documentation.   Patient Care Team: Venita Lick, NP as PCP - General (Nurse Practitioner) Vanita Ingles, RN as Case Manager ([place]) Vladimir Faster, [facility] (Pharmacist) Rebekah Chesterfield, LCSW as Social Worker (Licensed Clinical Social Worker)  Hospital visits: None in previous 6 months  Objective:  Lab Results  Component Value Date   CREATININE 1.34 (H) 09/08/2021   CREATININE 1.29 (H) 07/07/2021   CREATININE 1.31 (H) 03/17/2021    No results found for: HGBA1C Last diabetic Eye exam: No results found for: HMDIABEYEEXA  Last diabetic Foot exam: No results found for: HMDIABFOOTEX      Component Value Date/Time   CHOL 200 (H) 09/08/2021 0816   CHOL 188 07/07/2021 1024   CHOL 202 (H) 02/24/2021 0859   CHOL 178 06/03/2015 1127   TRIG 97 09/08/2021 0816   TRIG 112 07/07/2021 1024   TRIG 85 02/24/2021 0859   TRIG 93 06/03/2015 1127   HDL 52 09/08/2021 0816   HDL 55 07/07/2021 1024   HDL 51 02/24/2021 0859   CHOLHDL 2.8 08/23/2018 1635   VLDL 19 06/03/2015 1127   LDLCALC 131 (H) 09/08/2021 0816   LDLCALC 113 (H) 07/07/2021 1024   LDLCALC 136 (H) 02/24/2021 0859    Hepatic Function Latest Ref Rng & Units 09/08/2021 07/07/2021 02/24/2021  Total Protein 6.0  - 8.5 g/dL 7.3 7.8 7.6  Albumin 3.8 - 4.8 g/dL 4.7 4.8 4.6  AST 0 - 40 IU/L 20 19 26   ALT 0 - 32 IU/L 17 15 22   Alk Phosphatase 44 - 121 IU/L 92 111 88  Total Bilirubin 0.0 - 1.2 mg/dL 0.3 <0.2 0.3    Lab Results  Component Value Date/Time   TSH 2.740 09/08/2021 08:16 AM   TSH 2.850 02/24/2021 08:59 AM    CBC Latest Ref Rng & Units 09/08/2021 07/07/2021 02/24/2021  WBC 3.4 - 10.8 x10E3/uL 6.1 8.3 6.2  Hemoglobin 11.1 - 15.9 g/dL 10.0(L) 11.4 9.3(L)  Hematocrit 34.0 - 46.6 % 30.3(L) 34.2 29.5(L)  Platelets 150 - 450 x10E3/uL 285 327 292    Lab Results  Component Value Date/Time   VD25OH 63.7 09/08/2021 08:16 AM   VD25OH 63.3 02/16/2020 04:24 PM    Clinical ASCVD:  The 10-year ASCVD risk score (Arnett DK, et al., 2019) is: 16.8%   Values used to calculate the score:     Age: 44 years     Sex: Female     Is Non-Hispanic African American: Yes     Diabetic: No     Tobacco smoker: No     Systolic Blood Pressure: 427 mmHg     Is BP treated: Yes     HDL Cholesterol: 52 mg/dL     Total Cholesterol: 200 mg/dL   Social History   Tobacco  Use  Smoking Status Never  Smokeless Tobacco Never   BP Readings from Last 3 Encounters:  09/08/21 (!) 147/79  07/07/21 118/78  07/02/21 (!) 166/89   Pulse Readings from Last 3 Encounters:  09/08/21 69  07/07/21 74  02/24/21 77   Wt Readings from Last 3 Encounters:  09/08/21 140 lb 9.6 oz (63.8 kg)  07/07/21 140 lb 3.2 oz (63.6 kg)  02/24/21 140 lb 6.4 oz (63.7 kg)   BMI Readings from Last 3 Encounters:  09/08/21 30.97 kg/m  07/07/21 30.34 kg/m  02/24/21 30.38 kg/m    Assessment: Review of patient past medical history, allergies, medications, health status, including review of consultants reports, laboratory and other test data, was performed as part of comprehensive evaluation and provision of chronic care management services.   SDOH:  (Social Determinants of Health) assessments and interventions performed: Yes   CCM Care  Plan  Allergies  Allergen Reactions   Macrobid [Nitrofurantoin Macrocrystal] Other (See Comments)    Sore throat and irritation to throat    Medications Reviewed Today     Reviewed by Madelin Rear, [facility] (Pharmacist) on 09/22/21 at 1009  Med List Status: <None>   Medication Order Taking? Sig Documenting Provider Last Dose Status Informant  Ascorbic Acid (VITAMIN C) 1000 MG tablet [id number] Yes Take 1,000 mg by mouth daily. [provider]  Active Pharmacy Records  aspirin 81 MG tablet [id number] Yes Take 81 mg by mouth daily. [provider]  Active Pharmacy Records  atorvastatin (LIPITOR) 40 MG tablet [id number] Yes Take 1 tablet (40 mg total) by mouth daily. Marnee Guarneri T, NP  Active   Cholecalciferol 50 MCG (2000 UT) TABS [id number] Yes Take 1 tablet by mouth daily. [provider]  Active   FEROSUL 325 (65 Fe) MG tablet [id number] Yes TAKE 1 TABLET EVERY DAY WITH BREAKFAST Cannady, Jolene T, NP  Active   fluticasone (FLONASE) 50 MCG/ACT nasal spray [id number]  Place 2 sprays into both nostrils daily for 14 days. Myles Gip, DO  Active Pharmacy Records  Garlic 6948 MG CAPS [id number] Yes Take 1,000 mg by mouth daily. [provider]  Active            Med Note (HILL, TIFFANY A   Thu Oct 12, 2019  3:09 PM) Doesn't take regular  lamoTRIgine (LAMICTAL) 25 MG tablet [id number] Yes Take 2 tablets (50 mg total) by mouth 2 (two) times daily [provider]  Active   losartan (COZAAR) 100 MG tablet [id number] Yes Take 1 tablet (100 mg total) by mouth daily. Marnee Guarneri T, NP  Active   Multiple Vitamin (MULTIVITAMINS PO) [id number] Yes Take by mouth daily. [provider]  Active Pharmacy Records  omeprazole (PRILOSEC) 20 MG capsule [id number] Yes Take 1 capsule (20 mg total) by mouth daily. Marnee Guarneri T, NP  Active   topiramate (TOPAMAX) 25 MG tablet [id number] Yes Take by mouth. [provider]  Active   vitamin E 100 UNIT  capsule [id number] Yes Take 100 Units by mouth daily. [provider]  Active Pharmacy Records            Patient Active Problem List   Diagnosis Date Noted   Persistent proteinuria 09/08/2021   Hypercalcemia 08/04/2021   Secondary hyperparathyroidism ([place]) 12/23/2020   Seizure ([place]) 12/23/2020   Anemia 07/30/2020   Osteoarthritis of right knee 09/07/2019   Vasomotor symptoms due to menopause 03/24/2019   CKD (chronic kidney disease) stage 3, GFR 30-59 ml/min (HCC) 10/05/2016  Vitamin B12 deficiency (dietary) anemia 12/27/2015   Hypertension 12/27/2015   Insomnia 06/03/2015   Thyroid goiter 06/03/2015   HX: breast cancer 06/03/2015   Gastroesophageal reflux disease 06/03/2015   Hyperlipidemia 06/03/2015    Immunization History  Administered Date(s) Administered   Moderna Sars-Covid-2 Vaccination 02/12/2020, 03/12/2020, 11/30/2020, 12/08/2020   Td 12/22/2003    Conditions to be addressed/monitored: HLD HTN Insomnia   Current Barriers:  Unable to maintain control of HLD HTN  Pharmacist Clinical Goal(s):  Patient will achieve adherence to monitoring guidelines and medication adherence to achieve therapeutic efficacy through collaboration with PharmD and provider.   Interventions: 1:1 collaboration with Venita Lick, NP regarding development and update of comprehensive plan of care as evidenced by provider attestation and co-signature Inter-disciplinary care team collaboration (see longitudinal plan of care) Comprehensive medication review performed; medication list updated in electronic medical record  Hypertension (BP goal <140/90) -Not ideally controlled -Current treatment: Losartan 100 mg once daily -Medications previously tried: HCTZ, liinopril, quinapril  -Current home readings: eports 140/90  -Current dietary habits: tries to go to the gym on the weekend, work gets  -Current exercise habits: limits salt -Denies hypotensive/hypertensive  symptoms -Educated on Symptoms of hypotension and importance of maintaining adequate hydration; -Counseled to monitor BP at home 1-2x/wk, document, and provide log at future appointments -Recommended to continue current medication  Hyperlipidemia: (LDL goal < 100) -Controlled -Current treatment: Atorvastatin 40 mg once daily -Medications previously tried: atorvatatin 20 mg once daily  -Side effect review - no problems noted -Educated on Cholesterol goals;  -Recommended to continue current medication Counseled on medication adherence,  Patient Goals/Self-Care Activities Patient will:  - take medications as prescribed focus on medication adherence by taking medications at the same time every day   Medication Assistance: None required.  Patient affirms current coverage meets needs.  Patient's preferred pharmacy is:  [facility] [address] ([address]), [place] [address] [place] [address] Phone: [phone] Fax: [phone] Pt endorses 100% compliance  Follow Up:  Patient agrees to Care Plan and Follow-up. Plan: HC 3 month adh review. Pharmacist 6 month telephone   Future Appointments  Date Time Provider [place]  10/15/2021  3:15 PM [facility] NURSE HEALTH ADVISOR CFP-CFP [facility]  11/24/2021 10:30 AM CFP CCM SOCIAL WORK CFP-CFP [facility]  12/03/2021  9:00 AM CFP CCM CASE MANAGER CFP-CFP [facility]  03/09/2022  8:00 AM Venita Lick, NP CFP-CFP [facility]    Madelin Rear, PharmD, BCGP Clinical Pharmacist  [phone]

## 2021-09-22 NOTE — Patient Instructions (Signed)
Kelly Poole,  Thank you for talking with me today. I have included our care plan/goals in the following pages.   Please review and call me at [phone] with any questions.  Thanks! Ellin Mayhew, PharmD Clinical Pharmacist  [phone]  Current Barriers:  Unable to maintain control of HLD HTN  Pharmacist Clinical Goal(s):  Patient will achieve adherence to monitoring guidelines and medication adherence to achieve therapeutic efficacy through collaboration with PharmD and provider.   Interventions: 1:1 collaboration with Venita Lick, NP regarding development and update of comprehensive plan of care as evidenced by provider attestation and co-signature Inter-disciplinary care team collaboration (see longitudinal plan of care) Comprehensive medication review performed; medication list updated in electronic medical record  Hypertension (BP goal <140/90) -Not ideally controlled -Current treatment: Losartan 100 mg once daily -Medications previously tried: HCTZ, liinopril, quinapril  -Current home readings: eports 140/90  -Current dietary habits: tries to go to the gym on the weekend, work gets  -Current exercise habits: limits salt -Denies hypotensive/hypertensive symptoms -Educated on Symptoms of hypotension and importance of maintaining adequate hydration; -Counseled to monitor BP at home 1-2x/wk, document, and provide log at future appointments -Recommended to continue current medication  Hyperlipidemia: (LDL goal < 100) -Controlled -Current treatment: Atorvastatin 40 mg once daily -Medications previously tried: atorvatatin 20 mg once daily  -Side effect review - no problems noted -Educated on Cholesterol goals;  -Recommended to continue current medication Counseled on medication adherence,  Patient Goals/Self-Care Activities Patient will:  - take medications as prescribed focus on medication adherence by taking medications at the same time every day    Medication Assistance: None required.  Patient affirms current coverage meets needs.  Patient's preferred pharmacy is:  [facility] [address] ([address]), [place] - [address] [place] [address] Phone: [phone] Fax: [phone] Pt endorses 100% compliance  Follow Up:  Patient agrees to Care Plan and Follow-up. Plan: HC 3 month adh review. Pharmacist 6 month telephone   The patient verbalized understanding of instructions provided today and agreed to receive a MyChart copy of patient instruction and/or educational materials. Telephone follow up appointment with pharmacy team member scheduled for: See next appointment with "Care Management Staff" under "What's Next" below.  High Cholesterol High cholesterol is a condition in which the blood has high levels of a white, waxy substance similar to fat (cholesterol). The liver makes all the cholesterol that the body needs. The human body needs small amounts of cholesterol to help build cells. A person gets extra or excess cholesterol from the food that he or she eats. The blood carries cholesterol from the liver to the rest of the body. If you have high cholesterol, deposits (plaques) may build up on the walls of your arteries. Arteries are the blood vessels that carry blood away from your heart. These plaques make the arteries narrow and stiff. Cholesterol plaques increase your risk for heart attack and stroke. Work with your health care provider to keep your cholesterol levels in a healthy range. What increases the risk? The following factors may make you more likely to develop this condition: Eating foods that are high in animal fat (saturated fat) or cholesterol. Being overweight. Not getting enough exercise. A family history of high cholesterol (familial hypercholesterolemia). Use of tobacco products. Having diabetes. What are the signs or symptoms? In most cases, high cholesterol  does not usually cause any symptoms. In severe cases, very high cholesterol levels can  cause: Fatty bumps under the skin (xanthomas). A white or gray ring around the black center (pupil) of the eye. How is this diagnosed? This condition may be diagnosed based on the results of a blood test. If you are older than 68 years of age, your health care provider may check your cholesterol levels every 4-6 years. You may be checked more often if you have high cholesterol or other risk factors for heart disease. The blood test for cholesterol measures: "Bad" cholesterol, or LDL cholesterol. This is the main type of cholesterol that causes heart disease. The desired level is less than 100 mg/dL (2.59 mmol/L). "Good" cholesterol, or HDL cholesterol. HDL helps protect against heart disease by cleaning the arteries and carrying the LDL to the liver for processing. The desired level for HDL is 60 mg/dL (1.55 mmol/L) or higher. Triglycerides. These are fats that your body can store or burn for energy. The desired level is less than 150 mg/dL (1.69 mmol/L). Total cholesterol. This measures the total amount of cholesterol in your blood and includes LDL, HDL, and triglycerides. The desired level is less than 200 mg/dL (5.17 mmol/L). How is this treated? Treatment for high cholesterol starts with lifestyle changes, such as diet and exercise. Diet changes. You may be asked to eat foods that have more fiber and less saturated fats or added sugar. Lifestyle changes. These may include regular exercise, maintaining a healthy weight, and quitting use of tobacco products. Medicines. These are given when diet and lifestyle changes have not worked. You may be prescribed a statin medicine to help lower your cholesterol levels. Follow these instructions at home: Eating and drinking  Eat a healthy, balanced diet. This diet includes: Daily servings of a variety of fresh, frozen, or canned fruits and vegetables. Daily servings  of whole grain foods that are rich in fiber. Foods that are low in saturated fats and trans fats. These include poultry and fish without skin, lean cuts of meat, and low-fat dairy products. A variety of fish, especially oily fish that contain omega-3 fatty acids. Aim to eat fish at least 2 times a week. Avoid foods and drinks that have added sugar. Use healthy cooking methods, such as roasting, grilling, broiling, baking, poaching, steaming, and stir-frying. Do not fry your food except for stir-frying. If you drink alcohol: Limit how much you have to: 0-1 drink a day for women who are not pregnant. 0-2 drinks a day for men. Know how much alcohol is in a drink. In the U.S., one drink equals one 12 oz bottle of beer (355 mL), one 5 oz glass of wine (148 mL), or one 1 oz glass of hard liquor (44 mL). Lifestyle  Get regular exercise. Aim to exercise for a total of 150 minutes a week. Increase your activity level by doing activities such as gardening, walking, and taking the stairs. Do not use any products that contain nicotine or tobacco. These products include cigarettes, chewing tobacco, and vaping devices, such as e-cigarettes. If you need help quitting, ask your health care provider. General instructions Take over-the-counter and prescription medicines only as told by your health care provider. Keep all follow-up visits. This is important. Where to find more information American Heart Association: www.heart.org National Heart, Lung, and Blood Institute: https://wilson-eaton.com/ Contact a health care provider if: You have trouble achieving or maintaining a healthy diet or weight. You are starting an exercise program. You are unable to stop smoking. Get help right away if: You have chest pain. You have  trouble breathing. You have discomfort or pain in your jaw, neck, back, shoulder, or arm. You have any symptoms of a stroke. "BE FAST" is an easy way to remember the main warning signs of a  stroke: B - Balance. Signs are dizziness, sudden trouble walking, or loss of balance. E - Eyes. Signs are trouble seeing or a sudden change in vision. F - Face. Signs are sudden weakness or numbness of the face, or the face or eyelid drooping on one side. A - Arms. Signs are weakness or numbness in an arm. This happens suddenly and usually on one side of the body. S - Speech. Signs are sudden trouble speaking, slurred speech, or trouble understanding what people say. T - Time. Time to call emergency services. Write down what time symptoms started. You have other signs of a stroke, such as: A sudden, severe headache with no known cause. Nausea or vomiting. Seizure. These symptoms may represent a serious problem that is an emergency. Do not wait to see if the symptoms will go away. Get medical help right away. Call your local emergency services (911 in the U.S.). Do not drive yourself to the hospital. Summary Cholesterol plaques increase your risk for heart attack and stroke. Work with your health care provider to keep your cholesterol levels in a healthy range. Eat a healthy, balanced diet, get regular exercise, and maintain a healthy weight. Do not use any products that contain nicotine or tobacco. These products include cigarettes, chewing tobacco, and vaping devices, such as e-cigarettes. Get help right away if you have any symptoms of a stroke. This information is not intended to replace advice given to you by your health care provider. Make sure you discuss any questions you have with your health care provider. Document Revised: 02/20/2021 Document Reviewed: 02/10/2021 Elsevier Patient Education  2022 [employer].

## 2021-09-26 ENCOUNTER — Telehealth: Payer: Self-pay

## 2021-09-26 NOTE — Telephone Encounter (Signed)
   Telephone encounter was:  Successful.  09/26/2021 Name: MANVIR THORSON MRN: [id number] DOB: 05-24-1951  [place] [facility] is a 70 y.o. year old female who is a primary care patient of Cannady, Barbaraann Faster, NP . The community resource team was consulted for assistance with [place] guide performed the following interventions: Spoke with patient about [place].  Ms. Rhein has received food gift cards 2x in the last year. Emailed [employer] at [facility] to verify the number of times a patient can receive funds.  As soon as I receive a response I will call the patient.  IT consultant with food pantries to [employer] to be printed and mailed to patient.  Follow Up Plan:  Care guide will follow up with patient by phone over the next 7 days.  Jesiah Grismer, AAS Paralegal, [place] [facility]  [address],  [address] ??[email or web address]  ?? [phone]   [email or web address]

## 2021-09-26 NOTE — Telephone Encounter (Signed)
   Telephone encounter was:  Successful.  09/26/2021 Name: Kelly Poole MRN: [id number] DOB: 1951-02-14  Kelly Poole is a 70 y.o. year old female who is a primary care patient of Cannady, Barbaraann Faster, NP . The community resource team was consulted for assistance with [place] guide performed the following interventions: Received email message from [facility] patient can submit more request for funds.  Sent request to Tammy for $200 in grocery gift card. Called patient to let her know the grocery gift cards will be mailed interoffice to [facility] and she would receive a phone call when she can pick them up.   Follow Up Plan:  No further follow up planned at this time. The patient has been provided with needed resources.  [name], AAS Paralegal, [place] [facility]  [address], [place] [address] ??[email or web address]  ?? [phone]   [email or web address]

## 2021-10-01 ENCOUNTER — Telehealth: Payer: Self-pay | Admitting: Nurse Practitioner

## 2021-10-01 NOTE — Telephone Encounter (Signed)
Spoke with pt, she is looking for [employer] gift cards. I let pt know that whenever we get them we will give her a call.

## 2021-10-01 NOTE — Telephone Encounter (Signed)
Copied from [place] [phone]. Topic: General - Other >> Oct 01, 2021 10:09 AM Alanda Slim E wrote: Reason for CRM: Pt called to see if an envelope was upfront for her/ please advise

## 2021-10-03 ENCOUNTER — Ambulatory Visit: Payer: Medicare HMO | Admitting: Licensed Clinical Social Worker

## 2021-10-03 DIAGNOSIS — N1832 Chronic kidney disease, stage 3b: Secondary | ICD-10-CM

## 2021-10-03 DIAGNOSIS — I10 Essential (primary) hypertension: Secondary | ICD-10-CM

## 2021-10-03 DIAGNOSIS — Z5941 Food insecurity: Secondary | ICD-10-CM

## 2021-10-06 NOTE — Patient Instructions (Signed)
Visit Information   Goals Addressed             This Visit's Progress    SW-Track and Manage My Symptoms-Depression   On track    Timeframe:  Long-Range Goal Priority:  Medium Start Date: 02/10/21                            Expected End Date:  12/20/21                     Follow Up Date- 11/24/21   Patient Self Activities/Goals: Continue utilizing healthy coping skills discussed Attend all scheduled appointments and contact PCP with any questions or concerns Practice positive self-talk to promote positive mood        Patient verbalizes understanding of instructions provided today and agrees to view in [place].   Telephone follow up appointment with care management team member scheduled for:11/24/21  Kelly Poole, MSW, [email or web address] Phone [phone] 12:18 PM

## 2021-10-06 NOTE — Chronic Care Management (AMB) (Signed)
Chronic Care Management    Clinical Social Work Note  10/06/2021 Name: Kelly Poole MRN: [id number] DOB: [birth year]  Kelly Poole is a 70 y.o. year old female who is a primary care patient of Cannady, Barbaraann Faster, NP. The CCM team was consulted to assist the patient with chronic disease management and/or care coordination needs related to: Food Insecurity.   Engaged with patient by telephone for follow up visit in response to provider referral for social work chronic care management and care coordination services.   Consent to Services:  The patient was given information about Chronic Care Management services, agreed to services, and gave verbal consent prior to initiation of services.  Please see initial visit note for detailed documentation.   Patient agreed to services and consent obtained.   Consent to Services:  The patient was given information about Care Management services, agreed to services, and gave verbal consent prior to initiation of services.  Please see initial visit note for detailed documentation.   Patient agreed to services today and consent obtained.  Engaged with patient by phone in response to provider referral for social work care coordination services:  Assessment/Interventions:  Patient continues to maintain positive progress with care plan goals. She was following up on gift card to assist with food insecurity. Patient will be contacted when gift card is available for pick up. She agreed to contact office with any questions or concerns. Additional supportive resources on food pantries were provided. See Care Plan below for interventions and patient self-care activities.  Recent life changes or stressors: Financial strain  Recommendation: Patient may benefit from, and is in agreement work with LCSW to address care coordination needs and will continue to work with the clinical team to address health care and disease management related needs.   Follow up  Plan: Patient would like continued follow-up from CCM LCSW .  per patient's request will follow up in two months.  Will call office if needed prior to next encounter.  SDOH (Social Determinants of Health) assessments and interventions performed:  Food  Advanced Directives Status: Not addressed in this encounter.  CCM Care Plan  Allergies  Allergen Reactions   Macrobid [Nitrofurantoin Macrocrystal] Other (See Comments)    Sore throat and irritation to throat    Outpatient Encounter Medications as of 10/03/2021  Medication Sig Note   Ascorbic Acid (VITAMIN C) 1000 MG tablet Take 1,000 mg by mouth daily.    aspirin 81 MG tablet Take 81 mg by mouth daily.    atorvastatin (LIPITOR) 40 MG tablet Take 1 tablet (40 mg total) by mouth daily.    Cholecalciferol 50 MCG (2000 UT) TABS Take 1 tablet by mouth daily.    FEROSUL 325 (65 Fe) MG tablet TAKE 1 TABLET EVERY DAY WITH BREAKFAST    Garlic 9833 MG CAPS Take 1,000 mg by mouth daily. 10/12/2019: Doesn't take regular   lamoTRIgine (LAMICTAL) 25 MG tablet Take 2 tablets (50 mg total) by mouth 2 (two) times daily    losartan (COZAAR) 100 MG tablet Take 1 tablet (100 mg total) by mouth daily.    Multiple Vitamin (MULTIVITAMINS PO) Take by mouth daily.    omeprazole (PRILOSEC) 20 MG capsule Take 1 capsule (20 mg total) by mouth daily.    topiramate (TOPAMAX) 25 MG tablet Take by mouth.    vitamin E 100 UNIT capsule Take 100 Units by mouth daily.    No facility-administered encounter medications on file as of 10/03/2021.    Patient  Active Problem List   Diagnosis Date Noted   Persistent proteinuria 09/08/2021   Hypercalcemia 08/04/2021   Secondary hyperparathyroidism ([place]) 12/23/2020   Seizure ([place]) 12/23/2020   Anemia 07/30/2020   Osteoarthritis of right knee 09/07/2019   Vasomotor symptoms due to menopause 03/24/2019   CKD (chronic kidney disease) stage 3, GFR 30-59 ml/min (HCC) 10/05/2016   Vitamin B12 deficiency (dietary) anemia  12/27/2015   Hypertension 12/27/2015   Insomnia 06/03/2015   Thyroid goiter 06/03/2015   HX: breast cancer 06/03/2015   Gastroesophageal reflux disease 06/03/2015   Hyperlipidemia 06/03/2015    Conditions to be addressed/monitored:  Caregiver Stress ; Financial constraints related to food insecurity  Care Plan : General Social Work (Adult)  Updates made by Rebekah Chesterfield, LCSW since 10/06/2021 12:00 AM     Problem: Quality of Life (General Plan of Care)      Long-Range Goal: Quality of Life Maintained   Start Date: 02/10/2021  This Visit's Progress: On track  Recent Progress: On track  Priority: Medium  Note:   Timeframe:  Long-Range Goal Priority:  Medium Start Date: 02/10/21                            Expected End Date:  12/20/21                     Follow Up Date- 11/24/21   Current barriers:   Patient is having depressive symptoms due to losing independence as she is unable to drive right now  Unable to consistently perform activities of daily living and needs additional assistance / support in order to meet this unmet need Unable to  independently self manage needs related to chronic health conditions.  Knowledge deficits related to short term plan for care coordination needs and long term plans for chronic disease management needs Clinical Goals: Over the next 120 days, patient will work with SW to address concerns related to stress management, increasing self care and coping with being unable to drive at this time Interventions : Assessed needs, level of care concerns, and barriers to care Patient reports decrease in symptoms of  depression, irritability, and suicidal ideations with the discontinuation of medication (Keppra) Patient reports an increase in stress triggered by recent interaction with ex-partner who is having difficulty respecting boundaries  CCM LCSW provided validation and encouragement. Strategies to assist in promoting healthy boundaries were  discussed to promote stress management Patient continues to care for mother with diagnosis of dementia. This has resulted in patient's difficulty managing blood pressure due to stress from staying at mother's residence for five days a week Patient reports no recent seizures. She has been cleared to begin driving again and is excited about indepence Patient continues to receive strong support from family (son and brother) Patient is interested in applying for disability. CCM LCSW discussed process and provided web-site where she can apply ([email or web address]/) Patient's questions regarding process were answered 09/26: Patient has decided not to apply for disability at this time. States that she receives payment from [employer] to continue providing care for her mother Patient reports that she has applied for SNAP benefits; however was offered only $10. Patient reports food insecurity. CCM LCSW completed referral to Care Guide for assistance with local resources and/or gift card 10/14: Patient was contacted by Care Guide and informed that she is eligible for assistance with food insecurity. Patient reports that she has not received gift card and is  inquiring about availability. CCM LCSW collaborated with Geographical information systems officer. The gift card has not been delivered to the office. Patient was informed that she will be contacted to pick up, once the resource becomes available. Patient was appreciative for the assistance and verbalized understanding CCM LCSW reviewed upcoming appointments with patient.  Patient denies need for any additional resources at this time CCM LCSW received an incoming call from patient at 4:53 PM-Patient reports that the [place] is increasing anxiety symptoms. This has been ongoing noting that it was worse at beginning of the year. Per patient, "I'm better now" States medication makes her irritable, has difficulty sleeping, and increases thoughts of wanting to harm someone else  "the devil was in me" Currently she is taking medication 2 x daily. Patient reports that she has not had any seizures since January Discussed plans with patient for ongoing care management follow up and provided patient with direct contact information for care management team Other interventions provided: Solution-Focused Strategies and Emotional/Supportive Counseling Collaboration with PCP regarding development and update of comprehensive plan of care as evidenced by provider attestation and co-signature Inter-disciplinary care team collaboration (see longitudinal plan of care) Patient Self Activities/Goals: Continue utilizing healthy coping skills discussed Follow up with PCP regarding request for assistance with hair loss and scheduled appointments Practice positive self-talk to promote positive mood       Christa See, MSW, [email or web address] Phone [phone] 12:13 PM

## 2021-10-07 ENCOUNTER — Telehealth: Payer: Self-pay | Admitting: Nurse Practitioner

## 2021-10-07 NOTE — Telephone Encounter (Signed)
Encounter entered in error.

## 2021-10-07 NOTE — Telephone Encounter (Signed)
Received assistance card for patient from [employer] ($200.00) Called patient to let them know the card was here to be picked up. Patient stated that she will pick up card in the morning 10/08/2021

## 2021-10-08 NOTE — Telephone Encounter (Signed)
Pt picked up card 10/08/2021

## 2021-10-15 ENCOUNTER — Ambulatory Visit: Payer: Medicare HMO

## 2021-10-20 ENCOUNTER — Ambulatory Visit (INDEPENDENT_AMBULATORY_CARE_PROVIDER_SITE_OTHER): Payer: Medicare HMO

## 2021-10-20 VITALS — Ht <= 58 in | Wt 140.0 lb

## 2021-10-20 DIAGNOSIS — E7800 Pure hypercholesterolemia, unspecified: Secondary | ICD-10-CM | POA: Diagnosis not present

## 2021-10-20 DIAGNOSIS — Z0000 Encounter for general adult medical examination without abnormal findings: Secondary | ICD-10-CM | POA: Diagnosis not present

## 2021-10-20 DIAGNOSIS — I10 Essential (primary) hypertension: Secondary | ICD-10-CM | POA: Diagnosis not present

## 2021-10-20 NOTE — Progress Notes (Signed)
I connected with Kelly Poole today by telephone and verified that I am speaking with the correct person using two identifiers. Location patient: home Location provider: work Persons participating in the virtual visit: Deona, Novitski LPN.   I discussed the limitations, risks, security and privacy concerns of performing an evaluation and management service by telephone and the availability of in person appointments. I also discussed with the patient that there may be a patient responsible charge related to this service. The patient expressed understanding and verbally consented to this telephonic visit.    Interactive audio and video telecommunications were attempted between this provider and patient, however failed, due to patient having technical difficulties OR patient did not have access to video capability.  We continued and completed visit with audio only.     Vital signs may be patient reported or missing.  Subjective:   Kelly Poole is a 70 y.o. female who presents for Medicare Annual (Subsequent) preventive examination.  Review of Systems     Cardiac Risk Factors include: advanced age (>58men, >16 women);dyslipidemia;hypertension;obesity (BMI >30kg/m2);sedentary lifestyle     Objective:    Today's Vitals   10/20/21 1111  Weight: 140 lb (63.5 kg)  Height: 4\' 8"  (1.422 m)   Body mass index is 31.39 kg/m.  Advanced Directives 10/20/2021 12/25/2020 12/23/2020 10/14/2020 03/14/2020 10/12/2019  Does Patient Have a Medical Advance Directive? No No No No No No  Would patient like information on creating a medical advance directive? - No - Patient declined No - Patient declined - Yes (MAU/Ambulatory/Procedural Areas - Information given) Yes (MAU/Ambulatory/Procedural Areas - Information given)    Current Medications (verified) Outpatient Encounter Medications as of 10/20/2021  Medication Sig   Ascorbic Acid (VITAMIN C) 1000 MG tablet Take 1,000 mg by mouth daily.    aspirin 81 MG tablet Take 81 mg by mouth daily.   atorvastatin (LIPITOR) 40 MG tablet Take 1 tablet (40 mg total) by mouth daily.   Cholecalciferol 50 MCG (2000 UT) TABS Take 1 tablet by mouth daily.   FEROSUL 325 (65 Fe) MG tablet TAKE 1 TABLET EVERY DAY WITH BREAKFAST   Garlic 2542 MG CAPS Take 1,000 mg by mouth daily.   lamoTRIgine (LAMICTAL) 25 MG tablet Take 2 tablets (50 mg total) by mouth 2 (two) times daily   losartan (COZAAR) 100 MG tablet Take 1 tablet (100 mg total) by mouth daily.   Multiple Vitamin (MULTIVITAMINS PO) Take by mouth daily.   omeprazole (PRILOSEC) 20 MG capsule Take 1 capsule (20 mg total) by mouth daily.   topiramate (TOPAMAX) 25 MG tablet Take by mouth.   vitamin E 100 UNIT capsule Take 100 Units by mouth daily.   fluticasone (FLONASE) 50 MCG/ACT nasal spray Place 2 sprays into both nostrils daily for 14 days.   No facility-administered encounter medications on file as of 10/20/2021.    Allergies (verified) Macrobid [nitrofurantoin macrocrystal]   History: Past Medical History:  Diagnosis Date   Breast cancer ([place]) 1991   left breast mastectomy. No chemo or rad tx   GERD (gastroesophageal reflux disease)    Heart murmur    High cholesterol    Hypertension    Seizures ([place])    Past Surgical History:  Procedure Laterality Date   ABDOMINAL HYSTERECTOMY  1978   MASTECTOMY Left 1991   Due to breast cancer   TUBAL LIGATION     Family History  Problem Relation Age of Onset   Stroke Mother    Thyroid disease Mother  Cancer Father    Diabetes Maternal Aunt    Breast cancer Other    Social History   Socioeconomic History   Marital status: Single    Spouse name: Not on file   Number of children: Not on file   Years of education: Not on file   Highest education level: Not on file  Occupational History   Not on file  Tobacco Use   Smoking status: Never   Smokeless tobacco: Never  Vaping Use   Vaping Use: Never used  Substance and Sexual  Activity   Alcohol use: No    Alcohol/week: 0.0 standard drinks   Drug use: No   Sexual activity: Yes  Other Topics Concern   Not on file  Social History Narrative   Not on file   Social Determinants of Health   Financial Resource Strain: Low Risk    Difficulty of Paying Living Expenses: Not very hard  Food Insecurity: No Food Insecurity   Worried About Running Out of Food in the Last Year: Never true   Ran Out of Food in the Last Year: Never true  Transportation Needs: No Transportation Needs   Lack of Transportation (Medical): No   Lack of Transportation (Non-Medical): No  Physical Activity: Inactive   Days of Exercise per Week: 0 days   Minutes of Exercise per Session: 0 min  Stress: No Stress Concern Present   Feeling of Stress : Not at all  Social Connections: Socially Isolated   Frequency of Communication with Friends and Family: More than three times a week   Frequency of Social Gatherings with Friends and Family: More than three times a week   Attends Religious Services: Never   Marine scientist or Organizations: No   Attends Music therapist: Never   Marital Status: Never married    Tobacco Counseling Counseling given: Not Answered   Clinical Intake:  Pre-visit preparation completed: Yes  Pain : No/denies pain     Nutritional Status: BMI > 30  Obese Nutritional Risks: None Diabetes: No  How often do you need to have someone help you when you read instructions, pamphlets, or other written materials from your doctor or pharmacy?: 1 - Never What is the last grade level you completed in school?: 12th grade  Diabetic?no  Interpreter Needed?: No  Information entered by :: NAllen LPN   Activities of Daily Living In your present state of health, do you have any difficulty performing the following activities: 10/20/2021 09/08/2021  Hearing? N N  Vision? N N  Difficulty concentrating or making decisions? N N  Walking or climbing  stairs? N N  Dressing or bathing? N N  Doing errands, shopping? N N  Preparing Food and eating ? N -  Using the Toilet? N -  In the past six months, have you accidently leaked urine? N -  Do you have problems with loss of bowel control? N -  Managing your Medications? N -  Managing your Finances? N -  Housekeeping or managing your Housekeeping? N -  Some recent data might be hidden    Patient Care Team: Venita Lick, NP as PCP - General (Nurse Practitioner) Vanita Ingles, RN as Case Manager ([place]) Vladimir Faster, [facility] (Pharmacist) Rebekah Chesterfield, LCSW as Social Worker (Licensed Clinical Social Worker)  Indicate any recent [place] you may have received from other than [facility] providers in the past year (date may be approximate).     Assessment:  This is a routine wellness examination for Kelly Poole.  Hearing/Vision screen Vision Screening - Comments:: No regular eye exams, Dr. Gloriann Loan  Dietary issues and exercise activities discussed: Current Exercise Habits: The patient does not participate in regular exercise at present   Goals Addressed             This Visit's Progress    Patient Stated       10/20/2021, no goals       Depression Screen PHQ 2/9 Scores 10/20/2021 09/08/2021 07/02/2021 12/31/2020 10/14/2020 04/19/2020 02/16/2020  PHQ - 2 Score 0 0 0 0 0 0 0  PHQ- 9 Score - - - 0 - - -    Fall Risk Fall Risk  10/20/2021 09/08/2021 07/02/2021 12/31/2020 10/14/2020  Falls in the past year? 0 0 0 0 0  Number falls in past yr: - 0 0 - -  Injury with Fall? - 0 0 - -  Risk for fall due to : Medication side effect No Fall Risks No Fall Risks - Medication side effect  Follow up Falls evaluation completed;Education provided;Falls prevention discussed Education provided Falls evaluation completed - Falls evaluation completed;Education provided;Falls prevention discussed    FALL RISK PREVENTION PERTAINING TO THE HOME:  Any stairs in or around the home? No   If so, are there any without handrails? N/a Home free of loose throw rugs in walkways, pet beds, electrical cords, etc? Yes  Adequate lighting in your home to reduce risk of falls? Yes   ASSISTIVE DEVICES UTILIZED TO PREVENT FALLS:  Life alert? No  Use of a cane, walker or w/c? No  Grab bars in the bathroom? No  Shower chair or bench in shower? No  Elevated toilet seat or a handicapped toilet? No   TIMED UP AND GO:  Was the test performed? No .      Cognitive Function:     6CIT Screen 10/20/2021 10/14/2020 10/12/2019  What Year? 0 points 0 points 0 points  What month? 0 points 0 points 0 points  What time? 0 points 0 points 0 points  Count back from 20 2 points 0 points 0 points  Months in reverse 4 points 0 points 0 points  Repeat phrase 6 points 0 points 2 points  Total Score 12 0 2    Immunizations Immunization History  Administered Date(s) Administered   Moderna Sars-Covid-2 Vaccination 02/12/2020, 03/12/2020, 11/30/2020, 12/08/2020   Td 12/22/2003    TDAP status: Due, Education has been provided regarding the importance of this vaccine. Advised may receive this vaccine at local pharmacy or Health Dept. Aware to provide a copy of the vaccination record if obtained from local pharmacy or Health Dept. Verbalized acceptance and understanding.  Flu Vaccine status: Declined, Education has been provided regarding the importance of this vaccine but patient still declined. Advised may receive this vaccine at local pharmacy or Health Dept. Aware to provide a copy of the vaccination record if obtained from local pharmacy or Health Dept. Verbalized acceptance and understanding.  Pneumococcal vaccine status: Declined,  Education has been provided regarding the importance of this vaccine but patient still declined. Advised may receive this vaccine at local pharmacy or Health Dept. Aware to provide a copy of the vaccination record if obtained from local pharmacy or Health Dept.  Verbalized acceptance and understanding.   Covid-19 vaccine status: Completed vaccines  Qualifies for Shingles Vaccine? Yes   Zostavax completed No   Shingrix Completed?: No.    Education has been provided regarding the importance  of this vaccine. Patient has been advised to call insurance company to determine out of pocket expense if they have not yet received this vaccine. Advised may also receive vaccine at local pharmacy or Health Dept. Verbalized acceptance and understanding.  Screening Tests Health Maintenance  Topic Date Due   Zoster Vaccines- Shingrix (1 of 2) Never done   COVID-19 Vaccine (5 - Booster for Moderna series) 02/02/2021   INFLUENZA VACCINE  03/20/2022 (Originally 07/21/2021)   DEXA SCAN  07/02/2022 (Originally 10/15/2018)   TETANUS/TDAP  09/08/2022 (Originally 12/21/2013)   Pneumonia Vaccine 83+ Years old (1 - PCV) 10/20/2022 (Originally 10/15/2018)   MAMMOGRAM  09/05/2022   COLONOSCOPY (Pts 45-14yrs Insurance coverage will need to be confirmed)  01/16/2023   Hepatitis C Screening  Completed   HPV VACCINES  Aged Out    Health Maintenance  Health Maintenance Due  Topic Date Due   Zoster Vaccines- Shingrix (1 of 2) Never done   COVID-19 Vaccine (5 - Booster for Moderna series) 02/02/2021    Colorectal cancer screening: Type of screening: Colonoscopy. Completed 01/16/2013. Repeat every 10 years  Mammogram status: patient to schedule  Bone Density status: Ordered 09/08/2021. Pt provided with contact info and advised to call to schedule appt.  Lung Cancer Screening: (Low Dose CT Chest recommended if Age 38-80 years, 30 pack-year currently smoking OR have quit w/in 15years.) does not qualify.   Lung Cancer Screening Referral: no  Additional Screening:  Hepatitis C Screening: does qualify; Completed 08/09/2017  Vision Screening: Recommended annual ophthalmology exams for early detection of glaucoma and other disorders of the eye. Is the patient up to date with  their annual eye exam?  Yes  Who is the provider or what is the name of the office in which the patient attends annual eye exams? Dr. Gloriann Loan If pt is not established with a provider, would they like to be referred to a provider to establish care? No .   Dental Screening: Recommended annual dental exams for proper oral hygiene  Community Resource Referral / Chronic Care Management: CRR required this visit?  No   CCM required this visit?  No      Plan:     I have personally reviewed and noted the following in the patient's chart:   Medical and social history Use of alcohol, tobacco or illicit drugs  Current medications and supplements including opioid prescriptions.  Functional ability and status Nutritional status Physical activity Advanced directives List of other physicians Hospitalizations, surgeries, and ER visits in previous 12 months Vitals Screenings to include cognitive, depression, and falls Referrals and appointments  In addition, I have reviewed and discussed with patient certain preventive protocols, quality metrics, and best practice recommendations. A written personalized care plan for preventive services as well as general preventive health recommendations were provided to patient.     Kellie Simmering, LPN   30/16/0109   Nurse Notes: none

## 2021-10-20 NOTE — Patient Instructions (Signed)
Kelly Poole , Thank you for taking time to come for your Medicare Wellness Visit. I appreciate your ongoing commitment to your health goals. Please review the following plan we discussed and let me know if I can assist you in the future.   Screening recommendations/referrals: Colonoscopy: completed 01/16/2013 Mammogram: patient to schedule Bone Density: patient to schedule Recommended yearly ophthalmology/optometry visit for glaucoma screening and checkup Recommended yearly dental visit for hygiene and checkup  Vaccinations: Influenza vaccine: decline Pneumococcal vaccine: decline Tdap vaccine: decline Shingles vaccine: decline   Covid-19:12/08/2020, 03/12/2020, 02/12/2020  Advanced directives: Advance directive discussed with you today.   Conditions/risks identified: none  Next appointment: Follow up in one year for your annual wellness visit    Preventive Care 65 Years and Older, Female Preventive care refers to lifestyle choices and visits with your health care provider that can promote health and wellness. What does preventive care include? A yearly physical exam. This is also called an annual well check. Dental exams once or twice a year. Routine eye exams. Ask your health care provider how often you should have your eyes checked. Personal lifestyle choices, including: Daily care of your teeth and gums. Regular physical activity. Eating a healthy diet. Avoiding tobacco and drug use. Limiting alcohol use. Practicing safe sex. Taking low-dose aspirin every day. Taking vitamin and mineral supplements as recommended by your health care provider. What happens during an annual well check? The services and screenings done by your health care provider during your annual well check will depend on your age, overall health, lifestyle risk factors, and family history of disease. Counseling  Your health care provider may ask you questions about your: Alcohol use. Tobacco use. Drug  use. Emotional well-being. Home and relationship well-being. Sexual activity. Eating habits. History of falls. Memory and ability to understand (cognition). Work and work Statistician. Reproductive health. Screening  You may have the following tests or measurements: Height, weight, and BMI. Blood pressure. Lipid and cholesterol levels. These may be checked every 5 years, or more frequently if you are over 17 years old. Skin check. Lung cancer screening. You may have this screening every year starting at age 50 if you have a 30-pack-year history of smoking and currently smoke or have quit within the past 15 years. Fecal occult blood test (FOBT) of the stool. You may have this test every year starting at age 77. Flexible sigmoidoscopy or colonoscopy. You may have a sigmoidoscopy every 5 years or a colonoscopy every 10 years starting at age 62. Hepatitis C blood test. Hepatitis B blood test. Sexually transmitted disease (STD) testing. Diabetes screening. This is done by checking your blood sugar (glucose) after you have not eaten for a while (fasting). You may have this done every 1-3 years. Bone density scan. This is done to screen for osteoporosis. You may have this done starting at age 24. Mammogram. This may be done every 1-2 years. Talk to your health care provider about how often you should have regular mammograms. Talk with your health care provider about your test results, treatment options, and if necessary, the need for more tests. Vaccines  Your health care provider may recommend certain vaccines, such as: Influenza vaccine. This is recommended every year. Tetanus, diphtheria, and acellular pertussis (Tdap, Td) vaccine. You may need a Td booster every 10 years. Zoster vaccine. You may need this after age 63. Pneumococcal 13-valent conjugate (PCV13) vaccine. One dose is recommended after age 76. Pneumococcal polysaccharide (PPSV23) vaccine. One dose is recommended after age  65. Talk to your health care provider about which screenings and vaccines you need and how often you need them. This information is not intended to replace advice given to you by your health care provider. Make sure you discuss any questions you have with your health care provider. Document Released: 01/03/2016 Document Revised: 08/26/2016 Document Reviewed: 10/08/2015 Elsevier Interactive Patient Education  2017 [place] Prevention in the Home Falls can cause injuries. They can happen to people of all ages. There are many things you can do to make your home safe and to help prevent falls. What can I do on the outside of my home? Regularly fix the edges of walkways and driveways and fix any cracks. Remove anything that might make you trip as you walk through a door, such as a raised step or threshold. Trim any bushes or trees on the path to your home. Use bright outdoor lighting. Clear any walking paths of anything that might make someone trip, such as rocks or tools. Regularly check to see if handrails are loose or broken. Make sure that both sides of any steps have handrails. Any raised decks and porches should have guardrails on the edges. Have any leaves, snow, or ice cleared regularly. Use sand or salt on walking paths during winter. Clean up any spills in your garage right away. This includes oil or grease spills. What can I do in the bathroom? Use night lights. Install grab bars by the toilet and in the tub and shower. Do not use towel bars as grab bars. Use non-skid mats or decals in the tub or shower. If you need to sit down in the shower, use a plastic, non-slip stool. Keep the floor dry. Clean up any water that spills on the floor as soon as it happens. Remove soap buildup in the tub or shower regularly. Attach bath mats securely with double-sided non-slip rug tape. Do not have throw rugs and other things on the floor that can make you trip. What can I do in the  bedroom? Use night lights. Make sure that you have a light by your bed that is easy to reach. Do not use any sheets or blankets that are too big for your bed. They should not hang down onto the floor. Have a firm chair that has side arms. You can use this for support while you get dressed. Do not have throw rugs and other things on the floor that can make you trip. What can I do in the kitchen? Clean up any spills right away. Avoid walking on wet floors. Keep items that you use a lot in easy-to-reach places. If you need to reach something above you, use a strong step stool that has a grab bar. Keep electrical cords out of the way. Do not use floor polish or wax that makes floors slippery. If you must use wax, use non-skid floor wax. Do not have throw rugs and other things on the floor that can make you trip. What can I do with my stairs? Do not leave any items on the stairs. Make sure that there are handrails on both sides of the stairs and use them. Fix handrails that are broken or loose. Make sure that handrails are as long as the stairways. Check any carpeting to make sure that it is firmly attached to the stairs. Fix any carpet that is loose or worn. Avoid having throw rugs at the top or bottom of the stairs. If you do have throw  rugs, attach them to the floor with carpet tape. Make sure that you have a light switch at the top of the stairs and the bottom of the stairs. If you do not have them, ask someone to add them for you. What else can I do to help prevent falls? Wear shoes that: Do not have high heels. Have rubber bottoms. Are comfortable and fit you well. Are closed at the toe. Do not wear sandals. If you use a stepladder: Make sure that it is fully opened. Do not climb a closed stepladder. Make sure that both sides of the stepladder are locked into place. Ask someone to hold it for you, if possible. Clearly mark and make sure that you can see: Any grab bars or  handrails. First and last steps. Where the edge of each step is. Use tools that help you move around (mobility aids) if they are needed. These include: Canes. Walkers. Scooters. Crutches. Turn on the lights when you go into a dark area. Replace any light bulbs as soon as they burn out. Set up your furniture so you have a clear path. Avoid moving your furniture around. If any of your floors are uneven, fix them. If there are any pets around you, be aware of where they are. Review your medicines with your doctor. Some medicines can make you feel dizzy. This can increase your chance of falling. Ask your doctor what other things that you can do to help prevent falls. This information is not intended to replace advice given to you by your health care provider. Make sure you discuss any questions you have with your health care provider. Document Released: 10/03/2009 Document Revised: 05/14/2016 Document Reviewed: 01/11/2015 Elsevier Interactive Patient Education  2017 [employer].

## 2021-10-27 ENCOUNTER — Other Ambulatory Visit: Payer: Self-pay | Admitting: Nurse Practitioner

## 2021-10-27 DIAGNOSIS — Z1231 Encounter for screening mammogram for malignant neoplasm of breast: Secondary | ICD-10-CM

## 2021-11-24 ENCOUNTER — Ambulatory Visit (INDEPENDENT_AMBULATORY_CARE_PROVIDER_SITE_OTHER): Payer: Medicare HMO | Admitting: Licensed Clinical Social Worker

## 2021-11-24 ENCOUNTER — Ambulatory Visit: Payer: Self-pay

## 2021-11-24 DIAGNOSIS — E7800 Pure hypercholesterolemia, unspecified: Secondary | ICD-10-CM

## 2021-11-24 DIAGNOSIS — F5101 Primary insomnia: Secondary | ICD-10-CM

## 2021-11-24 DIAGNOSIS — N1832 Chronic kidney disease, stage 3b: Secondary | ICD-10-CM

## 2021-11-24 DIAGNOSIS — I10 Essential (primary) hypertension: Secondary | ICD-10-CM

## 2021-11-24 NOTE — Telephone Encounter (Signed)
Returned pt's call. Had to leave a message on her machine.

## 2021-11-24 NOTE — Patient Instructions (Signed)
Visit Information  Thank you for taking time to visit with me today. Please don't hesitate to contact me if I can be of assistance to you before our next scheduled telephone appointment.  Following are the goals we discussed today:  Patient Self Activities/Goals: Continue utilizing healthy coping skills discussed Follow up with PCP regarding request for assistance with hair loss and scheduled appointments Practice positive self-talk to promote positive mood  Our next appointment is by telephone on 02/16/22 at 10:00 AM  Please call the care guide team at [phone] if you need to cancel or reschedule your appointment.   If you are experiencing a Mental Health or [place] or need someone to talk to, please call 911   Patient verbalizes understanding of instructions provided today and agrees to view in [place].   Christa See, MSW, [email or web address] Phone [phone] 11:08 AM

## 2021-11-24 NOTE — Telephone Encounter (Signed)
3rd attempt to reach pt. Left VM to call back each attempt. Routing to provider for resolution per protocol.

## 2021-11-24 NOTE — Telephone Encounter (Signed)
Pt called back, states she wants to know if she can eat a couple of doritos chips since they have 260mg . Asked about the serving size and she says the serving size is 12 chips. Advised that the sodium daily limit is 1500mg  for HTN. Advised pt to limit eating them and keep a check on her BP. No other questions/concerns noted.

## 2021-11-24 NOTE — Telephone Encounter (Signed)
Reason for Disposition . General information question, no triage required and triager able to answer question  Answer Assessment - Initial Assessment Questions 1. REASON FOR CALL or QUESTION: "What is your reason for calling today?" or "How can I best help you?" or "What question do you have that I can help answer?"     Pt is asking can she eat some doritos chips.  Protocols used: Information Only Call - No Triage-A-AH

## 2021-11-24 NOTE — Telephone Encounter (Signed)
Summary: pt wanting to know how much Sodium is ok   [id number] FU wth pt. Pt last took BP was around 130 t0 140 over 78  or so. She has some doritoes and wasnts to know if 240 sodium is too much for her      Called pt and LM on VM to call back to discuss.

## 2021-11-24 NOTE — Chronic Care Management (AMB) (Signed)
Chronic Care Management    Clinical Social Work Note  11/24/2021 Name: Kelly Poole MRN: [id number] DOB: 19-Sep-1951  Kelly Poole is a 70 y.o. year old female who is a primary care patient of Cannady, Barbaraann Faster, NP. The CCM team was consulted to assist the patient with chronic disease management and/or care coordination needs related to: Mental Health Counseling and Resources.   Engaged with patient by telephone for follow up visit in response to provider referral for social work chronic care management and care coordination services.   Consent to Services:  The patient was given information about Chronic Care Management services, agreed to services, and gave verbal consent prior to initiation of services.  Please see initial visit note for detailed documentation.   Patient agreed to services and consent obtained.   Consent to Services:  The patient was given information about Care Management services, agreed to services, and gave verbal consent prior to initiation of services.  Please see initial visit note for detailed documentation.   Patient agreed to services today and consent obtained.  Engaged with patient by phone in response to provider referral for social work care coordination services:  Assessment/Interventions:  Patient continues to maintain positive progress with care plan goals. She has maintained healthy boundaries which has assisted with management of stress.   See Care Plan below for interventions and patient self-care activities.  Recent life changes or stressors: Management of health conditions, caregiver strain  Recommendation: Patient may benefit from, and is in agreement work with LCSW to address care coordination needs and will continue to work with the clinical team to address health care and disease management related needs.   Follow up Plan: Patient would like continued follow-up from CCM LCSW.  per patient's request will follow up in 02/16/22.  Will call  office if needed prior to next encounter.    SDOH (Social Determinants of Health) assessments and interventions performed:    Advanced Directives Status: Not addressed in this encounter.  CCM Care Plan  Allergies  Allergen Reactions   Macrobid [Nitrofurantoin Macrocrystal] Other (See Comments)    Sore throat and irritation to throat    Outpatient Encounter Medications as of 11/24/2021  Medication Sig Note   Ascorbic Acid (VITAMIN C) 1000 MG tablet Take 1,000 mg by mouth daily.    aspirin 81 MG tablet Take 81 mg by mouth daily.    atorvastatin (LIPITOR) 40 MG tablet Take 1 tablet (40 mg total) by mouth daily.    Cholecalciferol 50 MCG (2000 UT) TABS Take 1 tablet by mouth daily.    FEROSUL 325 (65 Fe) MG tablet TAKE 1 TABLET EVERY DAY WITH BREAKFAST    fluticasone (FLONASE) 50 MCG/ACT nasal spray Place 2 sprays into both nostrils daily for 14 days.    Garlic 4098 MG CAPS Take 1,000 mg by mouth daily. 10/12/2019: Doesn't take regular   lamoTRIgine (LAMICTAL) 25 MG tablet Take 2 tablets (50 mg total) by mouth 2 (two) times daily    losartan (COZAAR) 100 MG tablet Take 1 tablet (100 mg total) by mouth daily.    Multiple Vitamin (MULTIVITAMINS PO) Take by mouth daily.    omeprazole (PRILOSEC) 20 MG capsule Take 1 capsule (20 mg total) by mouth daily.    topiramate (TOPAMAX) 25 MG tablet Take by mouth.    vitamin E 100 UNIT capsule Take 100 Units by mouth daily.    No facility-administered encounter medications on file as of 11/24/2021.    Patient Active Problem  List   Diagnosis Date Noted   Persistent proteinuria 09/08/2021   Hypercalcemia 08/04/2021   Secondary hyperparathyroidism ([place]) 12/23/2020   Seizure ([place]) 12/23/2020   Anemia 07/30/2020   Osteoarthritis of right knee 09/07/2019   Vasomotor symptoms due to menopause 03/24/2019   CKD (chronic kidney disease) stage 3, GFR 30-59 ml/min ([facility]CC) 10/05/2016   Vitamin B12 deficiency (dietary) anemia 12/27/2015   Hypertension  12/27/2015   Insomnia 06/03/2015   Thyroid goiter 06/03/2015   HX: breast cancer 06/03/2015   Gastroesophageal reflux disease 06/03/2015   Hyperlipidemia 06/03/2015    Conditions to be addressed/monitored: HTN, CKD Stage 3, and Osteoarthritis; Caregiver Stress  Care Plan : General Social Work (Adult)  Updates made by Rebekah Chesterfield, LCSW since 11/24/2021 12:00 AM     Problem: Quality of Life (General Plan of Care)      Long-Range Goal: Quality of Life Maintained   Start Date: 02/10/2021  This Visit's Progress: On track  Recent Progress: On track  Priority: Medium  Note:   Timeframe:  Long-Range Goal Priority:  Medium Start Date: 02/10/21                            Expected End Date:   02/17/22                Follow Up Date- 02/16/22   Current barriers:   Patient is having depressive symptoms due to losing independence as she is unable to drive right now  Unable to consistently perform activities of daily living and needs additional assistance / support in order to meet this unmet need Unable to  independently self manage needs related to chronic health conditions.  Knowledge deficits related to short term plan for care coordination needs and long term plans for chronic disease management needs Clinical Goals: Over the next 120 days, patient will work with SW to address concerns related to stress management, increasing self care and coping with being unable to drive at this time Interventions : Assessed needs, level of care concerns, and barriers to care Patient reports decrease in symptoms of  depression, irritability, and suicidal ideations with the discontinuation of medication (Keppra) Patient reports an increase in stress triggered by recent interaction with ex-partner who is having difficulty respecting boundaries 12/05: Patient is doing well maintaining boundaries which has resulted in decreased stress CCM LCSW provided validation and encouragement. Strategies to assist  in promoting healthy boundaries were discussed to promote stress management Patient continues to care for mother with diagnosis of dementia. This has resulted in patient's difficulty managing blood pressure due to stress from staying at mother's residence for five days a week 12/05: Patient reports, "Everything is good" She is doing well with the management of BP Patient reports no recent seizures. She has been cleared to begin driving again and is excited about independence Patient continues to receive strong support from family (son and brother) Patient is interested in applying for disability. CCM LCSW discussed process and provided web-site where she can apply ([email or web address] Patient's questions regarding process were answered 09/26: Patient has decided not to apply for disability at this time. States that she receives payment from [employer] to continue providing care for her mother Patient reports that she has applied for SNAP benefits; however was offered only $10. Patient reports food insecurity. CCM LCSW completed referral to Care Guide for assistance with local resources and/or gift card 10/14: Patient was contacted by Care Guide and informed that  she is eligible for assistance with food insecurity. Patient reports that she has not received gift card and is inquiring about availability. CCM LCSW collaborated with Geographical information systems officer. The gift card has not been delivered to the office. Patient was informed that she will be contacted to pick up, once the resource becomes available. Patient was appreciative for the assistance and verbalized understanding CCM LCSW reviewed upcoming appointments with patient.  Patient denies need for any additional resources at this time CCM LCSW received an incoming call from patient at 4:53 PM-Patient reports that the [place] is increasing anxiety symptoms. This has been ongoing noting that it was worse at beginning of the year. Per patient, "I'm  better now" States medication makes her irritable, has difficulty sleeping, and increases thoughts of wanting to harm someone else "the devil was in me" Currently she is taking medication 2 x daily. Patient reports that she has not had any seizures since January Discussed plans with patient for ongoing care management follow up and provided patient with direct contact information for care management team Other interventions provided: Solution-Focused Strategies and Emotional/Supportive Counseling Collaboration with PCP regarding development and update of comprehensive plan of care as evidenced by provider attestation and co-signature Inter-disciplinary care team collaboration (see longitudinal plan of care) Patient Self Activities/Goals: Continue utilizing healthy coping skills discussed Follow up with PCP regarding request for assistance with hair loss and scheduled appointments Practice positive self-talk to promote positive mood        Christa See, MSW, [email or web address] Phone [phone] 11:06 AM

## 2021-12-03 ENCOUNTER — Telehealth: Payer: Medicare HMO

## 2021-12-20 DIAGNOSIS — E7800 Pure hypercholesterolemia, unspecified: Secondary | ICD-10-CM

## 2021-12-20 DIAGNOSIS — I10 Essential (primary) hypertension: Secondary | ICD-10-CM

## 2022-01-01 ENCOUNTER — Encounter: Payer: Self-pay | Admitting: Nurse Practitioner

## 2022-01-01 ENCOUNTER — Other Ambulatory Visit: Payer: Self-pay

## 2022-01-01 ENCOUNTER — Ambulatory Visit
Admission: RE | Admit: 2022-01-01 | Discharge: 2022-01-01 | Disposition: A | Discharge status: Home / Self Care | Payer: Medicare HMO | Source: Ambulatory Visit | Attending: Nurse Practitioner | Admitting: Nurse Practitioner

## 2022-01-01 DIAGNOSIS — Z1231 Encounter for screening mammogram for malignant neoplasm of breast: Secondary | ICD-10-CM | POA: Insufficient documentation

## 2022-01-01 DIAGNOSIS — Z853 Personal history of malignant neoplasm of breast: Secondary | ICD-10-CM | POA: Diagnosis not present

## 2022-01-01 DIAGNOSIS — Z780 Asymptomatic menopausal state: Secondary | ICD-10-CM | POA: Insufficient documentation

## 2022-01-01 DIAGNOSIS — M8589 Other specified disorders of bone density and structure, multiple sites: Secondary | ICD-10-CM | POA: Diagnosis not present

## 2022-01-01 DIAGNOSIS — M8588 Other specified disorders of bone density and structure, other site: Secondary | ICD-10-CM | POA: Insufficient documentation

## 2022-01-01 DIAGNOSIS — Z13820 Encounter for screening for osteoporosis: Secondary | ICD-10-CM | POA: Insufficient documentation

## 2022-01-01 IMAGING — MG MM DIGITAL SCREENING UNILAT*R* W/ TOMO W/ CAD
6 series · 6 of 18 positions shown · non-contrast
Comparison: Previous exam(s).

CLINICAL DATA: Screening.

EXAM:
DIGITAL SCREENING UNILATERAL RIGHT MAMMOGRAM WITH CAD AND
TOMOSYNTHESIS
TECHNIQUE: Right screening digital craniocaudal and mediolateral oblique
mammograms were obtained. Right screening digital breast
tomosynthesis was performed. The images were evaluated with
computer-aided detection.

[R CC synth-2D]
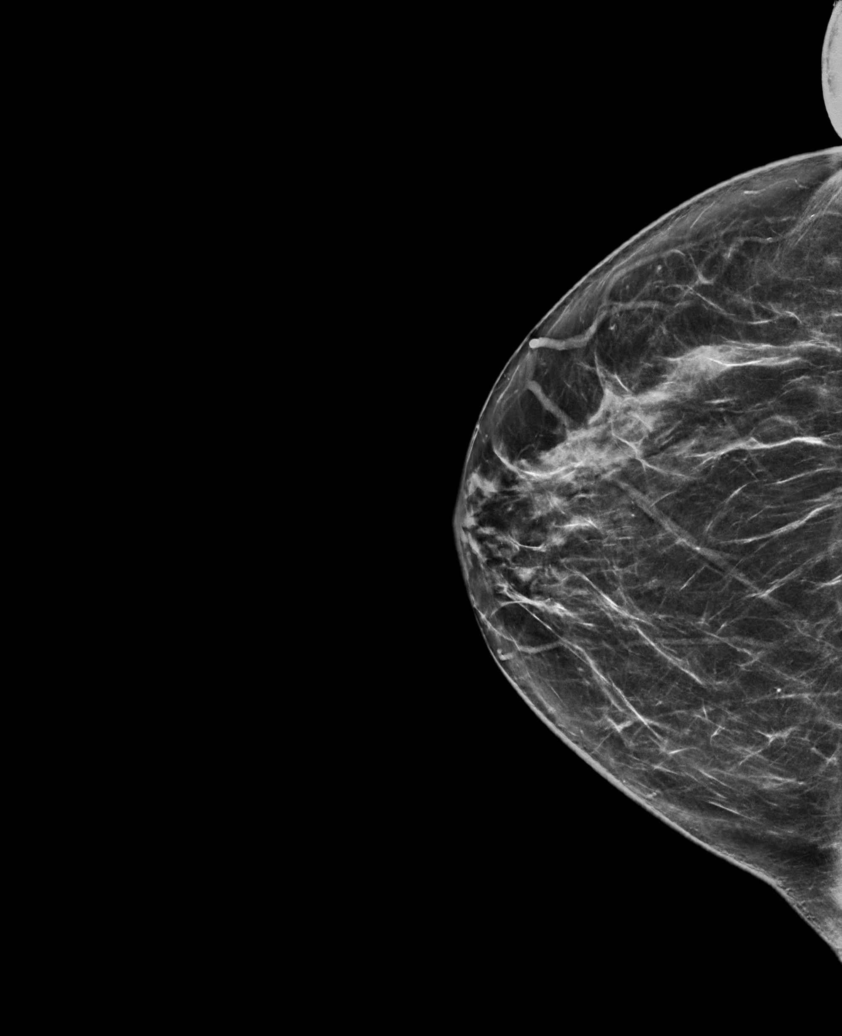

[R MLO synth-2D (1 of 2)]
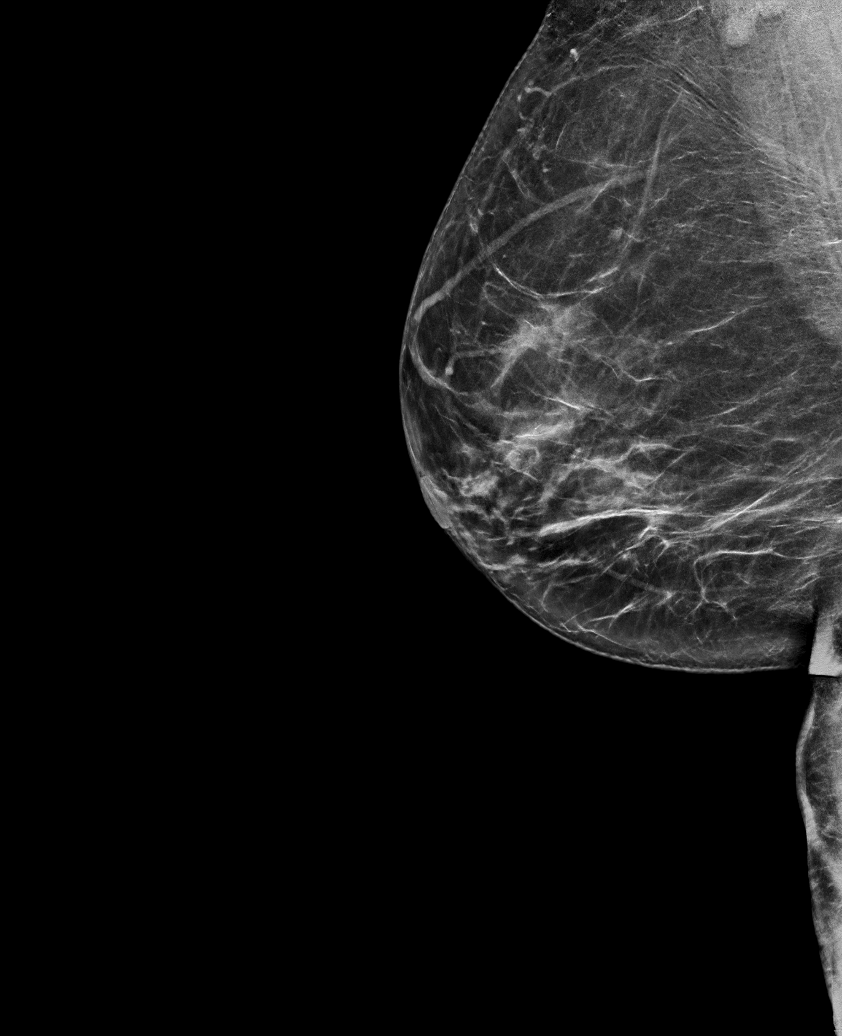

[R MLO synth-2D (2 of 2)]
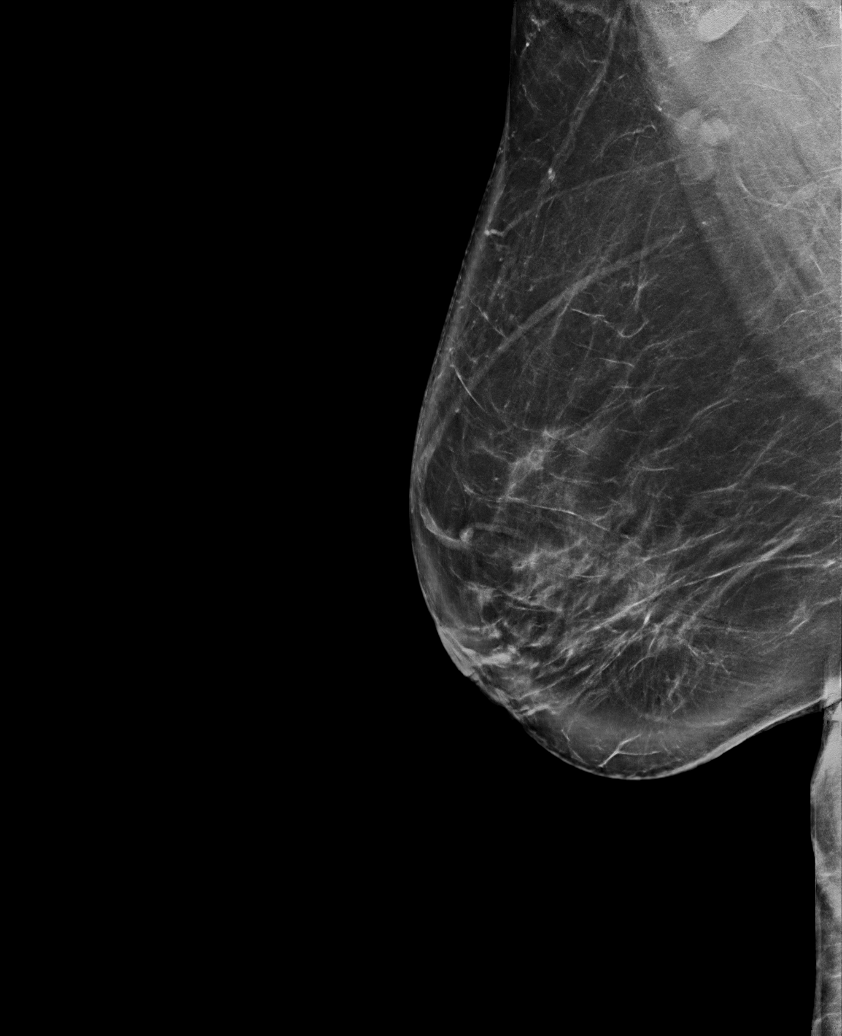

[R MLO tomo (1 of 2) · tomo slice 56/111.0]
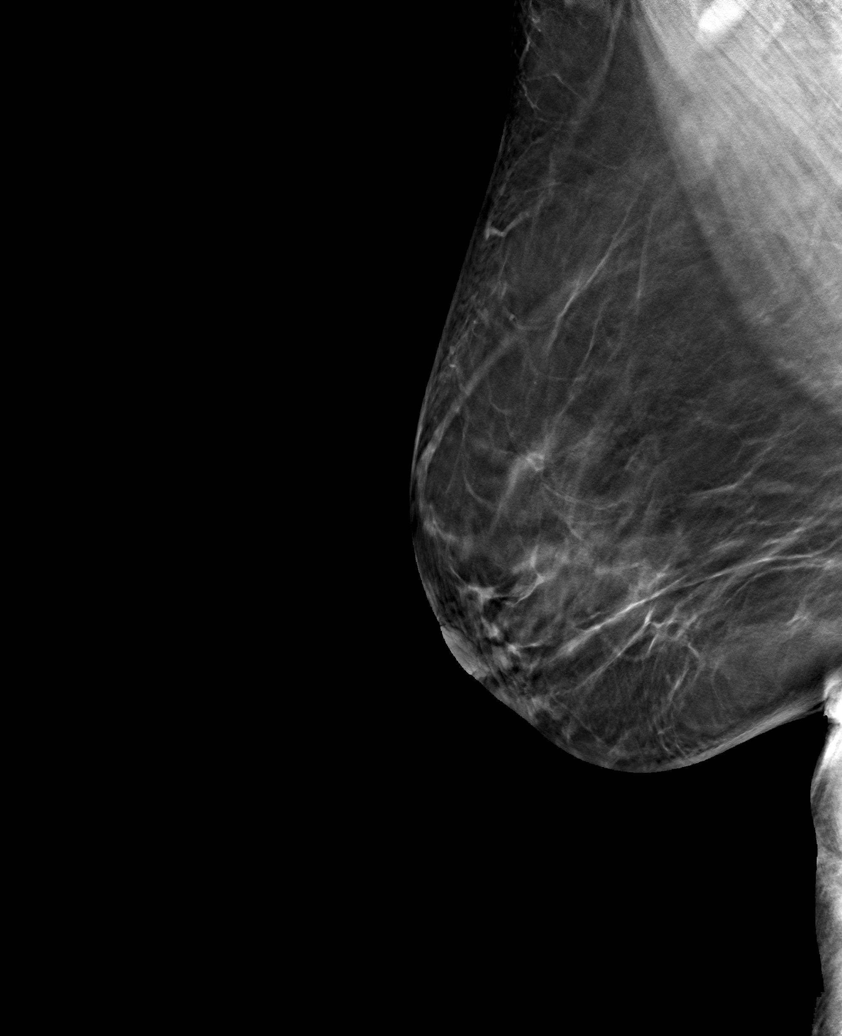

[R MLO tomo (2 of 2) · tomo slice 38/75.0]
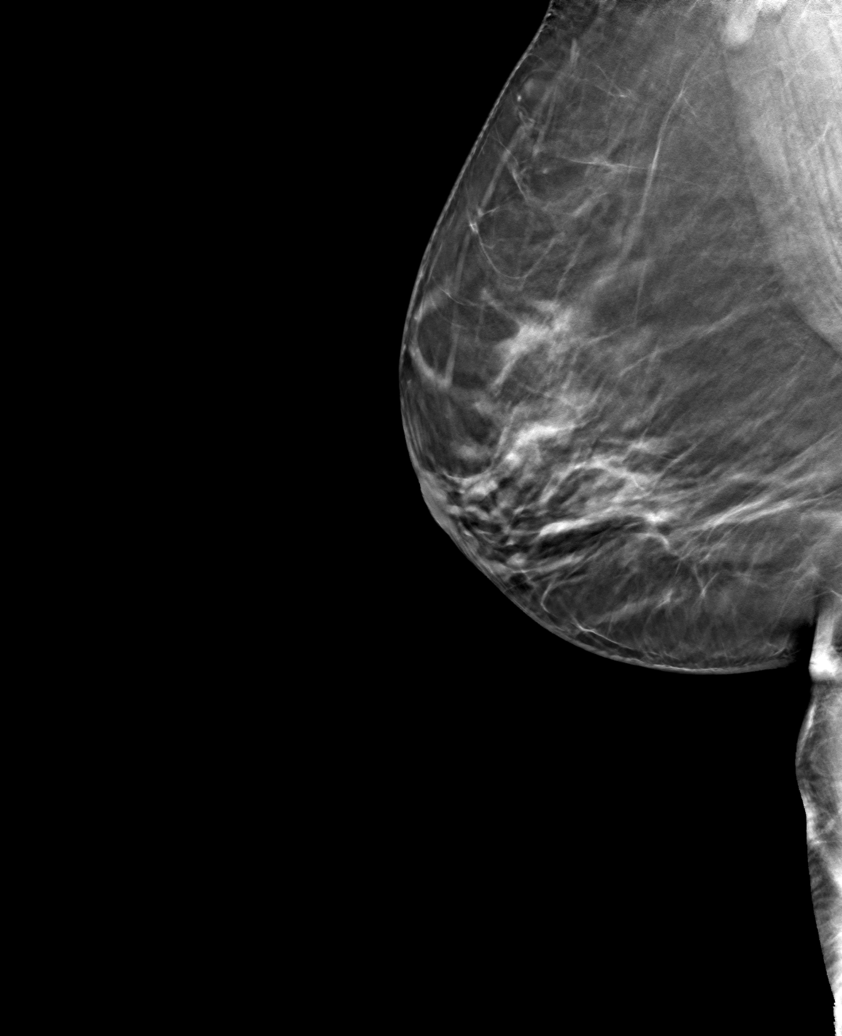

[R CC tomo · tomo slice 36/71.0]
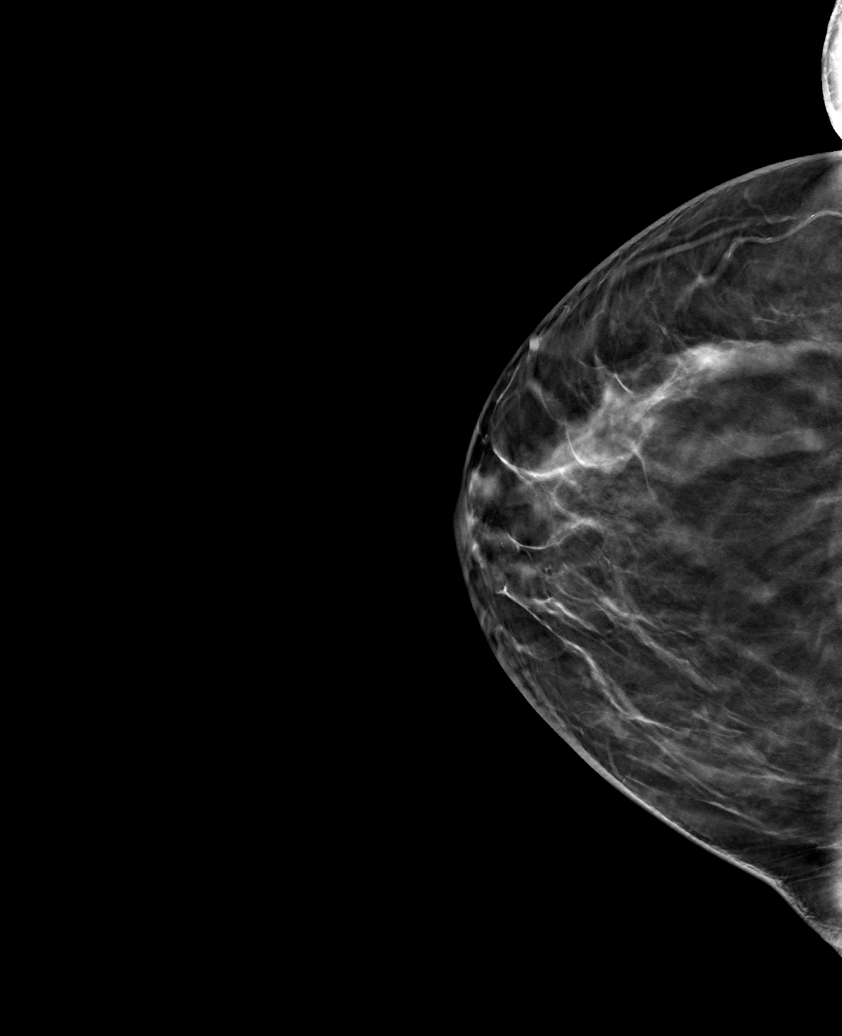

[6 of 18 positions shown; findings below may reference images not displayed]

ACR Breast Density Category b: There are scattered areas of
fibroglandular density.
FINDINGS: There are no findings suspicious for malignancy.
IMPRESSION: No mammographic evidence of malignancy. A result letter of this
screening mammogram will be mailed directly to the patient.

RECOMMENDATION:
Screening mammogram in one year. (Code:[2H])

BI-RADS CATEGORY  1: Negative.

## 2022-01-01 NOTE — Progress Notes (Signed)
Contacted via MyChart   Good evening Kelly Poole, normal mammogram returned -- may repeat in one year:)

## 2022-01-01 NOTE — Progress Notes (Signed)
Contacted via [name] evening Kelly Poole, your bone density shows thinning bones (osteopenia) but not brittle (osteoporosis). We recommend Vitamin D supplementation of about 2,0000 IUs of over the counter Vitamin D3. In addition, we recommend a diet high in calcium with dairy and dark green leafy vegetables. We would like you to get plenty of weight bearing exercises with walking and resistance training such as light weights or resistance bands available with instructions at places such as [employer]. Any questions?

## 2022-01-02 ENCOUNTER — Telehealth: Payer: Self-pay | Admitting: Nurse Practitioner

## 2022-01-02 NOTE — Telephone Encounter (Signed)
Copied from [place] [phone]. Topic: General - Other >> Jan 02, 2022 11:07 AM Kelly Poole wrote: Reason for CRM: Pt would like a call to go over mammogram and bone density results/ she received them on mychart but they are cut off and she cant see the entire result / please advise

## 2022-01-02 NOTE — Telephone Encounter (Signed)
Patient aware of results, no further questions.  ?

## 2022-01-13 DIAGNOSIS — C50112 Malignant neoplasm of central portion of left female breast: Secondary | ICD-10-CM | POA: Diagnosis not present

## 2022-01-13 DIAGNOSIS — Z9012 Acquired absence of left breast and nipple: Secondary | ICD-10-CM | POA: Diagnosis not present

## 2022-01-13 DIAGNOSIS — Z4432 Encounter for fitting and adjustment of external left breast prosthesis: Secondary | ICD-10-CM | POA: Diagnosis not present

## 2022-01-16 ENCOUNTER — Telehealth: Payer: Self-pay | Admitting: Nurse Practitioner

## 2022-01-16 NOTE — Telephone Encounter (Signed)
Copied from [place] [id number]. Topic: General - Inquiry >> Jan 16, 2022 10:12 AM Greggory Keen D wrote: Reason for CRM: Pt called asking for a handicap sticker .  She needs two tags.  CB#  [phone]

## 2022-01-16 NOTE — Telephone Encounter (Signed)
Noted  

## 2022-01-19 NOTE — Telephone Encounter (Signed)
Called patient to advise that paperwork has been signed by provider and is ready for pickup. Placed in completed paperwork folder.

## 2022-01-23 ENCOUNTER — Telehealth: Payer: Medicare HMO

## 2022-01-23 ENCOUNTER — Ambulatory Visit (INDEPENDENT_AMBULATORY_CARE_PROVIDER_SITE_OTHER): Payer: Medicare HMO

## 2022-01-23 DIAGNOSIS — I10 Essential (primary) hypertension: Secondary | ICD-10-CM

## 2022-01-23 DIAGNOSIS — E7800 Pure hypercholesterolemia, unspecified: Secondary | ICD-10-CM

## 2022-01-23 DIAGNOSIS — N1832 Chronic kidney disease, stage 3b: Secondary | ICD-10-CM

## 2022-01-23 DIAGNOSIS — R569 Unspecified convulsions: Secondary | ICD-10-CM

## 2022-01-23 NOTE — Patient Instructions (Signed)
Visit Information  Thank you for taking time to visit with me today. Please don't hesitate to contact me if I can be of assistance to you before our next scheduled telephone appointment.  Following are the goals we discussed today:  RNCM Clinical Goal(s):  Patient will verbalize basic understanding of HTN, HLD, CKD Stage 3, and Seizures disease process and self health management plan as evidenced by keeping appointments, stable blood pressures, no new seizure activity, and working with the CCM team to optimize health and well being take all medications exactly as prescribed and will call provider for medication related questions as evidenced by compliance with medications and calling for refills before running out of medications    attend all scheduled medical appointments: 03-09-2022 at 0800 am as evidenced by keeping appointments and call for schedule change needs         demonstrate improved and ongoing health management independence as evidenced by stable chronic conditions, calling the office for changes or questions and utilization of resources to help with effective management of chronic conditions        demonstrate ongoing self health care management ability for effective management of Chronic conditions as evidenced by working with the CCM team through collaboration with Consulting civil engineer, provider, and care team.    Interventions: 1:1 collaboration with primary care provider regarding development and update of comprehensive plan of care as evidenced by provider attestation and co-signature Inter-disciplinary care team collaboration (see longitudinal plan of care) Evaluation of current treatment plan related to  self management and patient's adherence to plan as established by provider     Chronic Kidney Disease (Status: Goal on Track (progressing): YES.)  Long Term Goal  Last practice recorded BP readings:     BP Readings from Last 3 Encounters:  09/08/21 (!) 147/79  07/07/21 118/78   07/02/21 (!) 166/89  Most recent eGFR/CrCl:       Lab Results  Component Value Date    EGFR 43 (L) 09/08/2021    No components found for: CRCL   Assessed the patient     understanding of chronic kidney disease    Evaluation of current treatment plan related to chronic kidney disease self management and patient's adherence to plan as established by provider      Provided education to patient re: stroke prevention, s/s of heart attack and stroke    Reviewed prescribed diet Heart Healthy Diet  Reviewed medications with patient and discussed importance of compliance    Advised patient, providing education and rationale, to monitor blood pressure daily and record, calling PCP for findings outside established parameters    Discussed complications of poorly controlled blood pressure such as heart disease, stroke, circulatory complications, vision complications, kidney impairment, sexual dysfunction    Advised patient to discuss changes in kidney function, new concerns with kidney function, questions or concerns with provider    Discussed plans with patient for ongoing care management follow up and provided patient with direct contact information for care management team    Screening for signs and symptoms of depression related to chronic disease state      Discussed the impact of chronic kidney disease on daily life and mental health and acknowledged and normalized feelings of disempowerment, fear, and frustration    Assessed social determinant of health barriers    Provided education on kidney disease progression        History of Seizures  (Status: Goal on Track (progressing): YES.) Long Term Goal  Evaluation of  current treatment plan related to  Seizures ,  self-management and patient's adherence to plan as established by provider. Discussed plans with patient for ongoing care management follow up and provided patient with direct contact information for care management team Advised patient to  call the office for new onset of seizure activity, changes in mentation, or changes in health and well being related to seizures; Provided education to patient re: importance of medications compliance, routine visits for ongoing evaluation of chronic conditions and working with the CCM team to effectively manage chronic conditions; Reviewed medications with patient and discussed compliance, education provided to the patient on the importance of taking medications as directed and not missing doses; Provided patient with safety and preventing injuries, monitoring for changes in mentation educational materials related to seizure prevention and safety; Reviewed scheduled/upcoming provider appointments including 03-09-2022 at 0800 am; Discussed plans with patient for ongoing care management follow up and provided patient with direct contact information for care management team; Advised patient to discuss changes in seizure activity, new onset of seizures, and questions or concerns related to neurological health and well being with provider;   Hyperlipidemia:  (Status: Goal on Track (progressing): YES.) Long Term Goal       Lab Results  Component Value Date    CHOL 200 (H) 09/08/2021    HDL 52 09/08/2021    LDLCALC 131 (H) 09/08/2021    TRIG 97 09/08/2021    CHOLHDL 2.8 08/23/2018      Medication review performed; medication list updated in electronic medical record. 01-23-2022: The patient takes Lipitor 40 mg QD for cholesterol. Denies any issues or concerns with medications Provider established cholesterol goals reviewed; Counseled on importance of regular laboratory monitoring as prescribed. 01-23-2022: The patient has regular lab testing. Will continue to monitor.  Provided HLD educational materials; Reviewed role and benefits of statin for ASCVD risk reduction; Reviewed importance of limiting foods high in cholesterol;   Hypertension: (Status: Goal on Track (progressing): YES.) Long Term Goal   Last practice recorded BP readings:     BP Readings from Last 3 Encounters:  09/08/21 (!) 147/79  07/07/21 118/78  07/02/21 (!) 166/89  Most recent eGFR/CrCl:       Lab Results  Component Value Date    EGFR 43 (L) 09/08/2021    No components found for: CRCL Self reported blood pressure of 130/70 Evaluation of current treatment plan related to hypertension self management and patient's adherence to plan as established by provider. 01-23-2022: The patient is taking her blood pressures and states they are WNL. Denies any new concerns with HTN or heart health;   Provided education to patient re: stroke prevention, s/s of heart attack and stroke; Reviewed prescribed diet heart healthy diet  Reviewed medications with patient and discussed importance of compliance.01-23-2022: The patient is compliant with medications. Denies any needs with medications;  Discussed plans with patient for ongoing care management follow up and provided patient with direct contact information for care management team; Advised patient, providing education and rationale, to monitor blood pressure daily and record, calling PCP for findings outside established parameters;  Advised patient to discuss blood pressure trends with provider; Provided education on prescribed diet heart healthy diet ;  Discussed complications of poorly controlled blood pressure such as heart disease, stroke, circulatory complications, vision complications, kidney impairment, sexual dysfunction;  Review of stressors that can impact her blood pressures. 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 She is having some arm pain at times  in her right arm and she feels like this is due to lifting of her Mother. Education and support given. Review of discussing this with pcp at next appointment if it is not better. She takes Tylenol and this is effective with her pain relief. The patient denies any acute findings. She feels she is doing well  right now.    Patient Goals/Self-Care Activities: Take medications as prescribed   Attend all scheduled provider appointments Call pharmacy for medication refills 3-7 days in advance of running out of medications Attend church or other social activities Perform all self care activities independently  Perform IADL's (shopping, preparing meals, housekeeping, managing finances) independently Call provider office for new concerns or questions  Work with the social worker to address care coordination needs and will continue to work with the clinical team to address health care and disease management related needs call the Suicide and Crisis Lifeline: 988 call the [place] National Suicide Prevention Lifeline: 660-015-6468 or TTY: 506 806 3161 TTY (920)465-0428) to talk to a trained counselor call 1-800-273-TALK (toll free, 24 hour hotline) if experiencing a Mental Health or [place]  check blood pressure 3 times per week choose a place to take my blood pressure (home, clinic or office, retail store) write blood pressure results in a log or diary learn about high blood pressure keep a blood pressure log take blood pressure log to all doctor appointments call doctor for signs and symptoms of high blood pressure develop an action plan for high blood pressure keep all doctor appointments take medications for blood pressure exactly as prescribed begin an exercise program report new symptoms to your doctor eat more whole grains, fruits and vegetables, lean meats and healthy fats - call for medicine refill 2 or 3 days before it runs out - take all medications exactly as prescribed - call doctor with any symptoms you believe are related to your medicine - call doctor when you experience any new symptoms - go to all doctor appointments as scheduled - adhere to prescribed diet: Heart Healthy Diet - develop an exercise routine      Our next appointment is by telephone on 04-10-2022  at 0945 am  Please call the care guide team at [phone] if you need to cancel or reschedule your appointment.   If you are experiencing a Mental Health or [place] or need someone to talk to, please call the Suicide and Crisis Lifeline: 988 call the [place] National Suicide Prevention Lifeline: 317-355-8735 or TTY: 650-062-4146 TTY (267) 659-6345) to talk to a trained counselor call 1-800-273-TALK (toll free, 24 hour hotline)   Patient verbalizes understanding of instructions and care plan provided today and agrees to view in [place]. Active MyChart status confirmed with patient.    Noreene Larsson RN, MSN, [facility] Mobile: [phone]

## 2022-01-23 NOTE — Chronic Care Management (AMB) (Signed)
Chronic Care Management   CCM RN Visit Note  01/23/2022 Name: Kelly Poole MRN: [id number] DOB: [birth year]  Subjective: Kelly Poole is a 71 y.o. year old female who is a primary care patient of Cannady, Barbaraann Faster, NP. The care management team was consulted for assistance with disease management and care coordination needs.    Engaged with patient by telephone for follow up visit in response to provider referral for case management and/or care coordination services.   Consent to Services:  The patient was given information about Chronic Care Management services, agreed to services, and gave verbal consent prior to initiation of services.  Please see initial visit note for detailed documentation.   Patient agreed to services and verbal consent obtained.   Assessment: Review of patient past medical history, allergies, medications, health status, including review of consultants reports, laboratory and other test data, was performed as part of comprehensive evaluation and provision of chronic care management services.   SDOH (Social Determinants of Health) assessments and interventions performed:    CCM Care Plan  Allergies  Allergen Reactions   Macrobid [Nitrofurantoin Macrocrystal] Other (See Comments)    Sore throat and irritation to throat    Outpatient Encounter Medications as of 01/23/2022  Medication Sig Note   Ascorbic Acid (VITAMIN C) 1000 MG tablet Take 1,000 mg by mouth daily.    aspirin 81 MG tablet Take 81 mg by mouth daily.    atorvastatin (LIPITOR) 40 MG tablet Take 1 tablet (40 mg total) by mouth daily.    Cholecalciferol 50 MCG (2000 UT) TABS Take 1 tablet by mouth daily.    FEROSUL 325 (65 Fe) MG tablet TAKE 1 TABLET EVERY DAY WITH BREAKFAST    fluticasone (FLONASE) 50 MCG/ACT nasal spray Place 2 sprays into both nostrils daily for 14 days.    Garlic 4098 MG CAPS Take 1,000 mg by mouth daily. 10/12/2019: Doesn't take regular   lamoTRIgine (LAMICTAL) 25 MG  tablet Take 2 tablets (50 mg total) by mouth 2 (two) times daily    losartan (COZAAR) 100 MG tablet Take 1 tablet (100 mg total) by mouth daily.    Multiple Vitamin (MULTIVITAMINS PO) Take by mouth daily.    omeprazole (PRILOSEC) 20 MG capsule Take 1 capsule (20 mg total) by mouth daily.    topiramate (TOPAMAX) 25 MG tablet Take by mouth.    vitamin E 100 UNIT capsule Take 100 Units by mouth daily.    No facility-administered encounter medications on file as of 01/23/2022.    Patient Active Problem List   Diagnosis Date Noted   Osteopenia of lumbar spine 01/01/2022   Persistent proteinuria 09/08/2021   Hypercalcemia 08/04/2021   Secondary hyperparathyroidism ([place]) 12/23/2020   Seizure ([place]) 12/23/2020   Anemia 07/30/2020   Osteoarthritis of right knee 09/07/2019   Vasomotor symptoms due to menopause 03/24/2019   CKD (chronic kidney disease) stage 3, GFR 30-59 ml/min ([place]) 10/05/2016   Vitamin B12 deficiency (dietary) anemia 12/27/2015   Hypertension 12/27/2015   Insomnia 06/03/2015   Thyroid goiter 06/03/2015   HX: breast cancer 06/03/2015   Gastroesophageal reflux disease 06/03/2015   Hyperlipidemia 06/03/2015    Conditions to be addressed/monitored:HTN, HLD, CKD Stage 3, and Seizures  Care Plan : RNCM: General Plan of Care (Adult) for Chronic Disease Management and Care Coordination Needs  Updates made by Vanita Ingles, RN since 01/23/2022 12:00 AM     Problem: RNCM: Development of Plan of Care for Chronic Disease Management ( HTN, HLD,CKD3,  Seizures)   Priority: High     Long-Range Goal: RNCM: Effective Management of Plan of Care for Chronic Disease Management ( HTN, HLD,CKD3, Seizures)   Start Date: 01/23/2022  Expected End Date: 01/23/2023  Priority: High  Note:   Current Barriers:  Knowledge Deficits related to plan of care for management of HTN, HLD, CKD Stage 3, and Seizures  Chronic Disease Management support and education needs related to HTN, HLD, CKD Stage 3, and  Seizures  RNCM Clinical Goal(s):  Patient will verbalize basic understanding of HTN, HLD, CKD Stage 3, and Seizures disease process and self health management plan as evidenced by keeping appointments, stable blood pressures, no new seizure activity, and working with the CCM team to optimize health and well being take all medications exactly as prescribed and will call provider for medication related questions as evidenced by compliance with medications and calling for refills before running out of medications    attend all scheduled medical appointments: 03-09-2022 at 0800 am as evidenced by keeping appointments and call for schedule change needs         demonstrate improved and ongoing health management independence as evidenced by stable chronic conditions, calling the office for changes or questions and utilization of resources to help with effective management of chronic conditions        demonstrate ongoing self health care management ability for effective management of Chronic conditions as evidenced by working with the CCM team through collaboration with Consulting civil engineer, provider, and care team.   Interventions: 1:1 collaboration with primary care provider regarding development and update of comprehensive plan of care as evidenced by provider attestation and co-signature Inter-disciplinary care team collaboration (see longitudinal plan of care) Evaluation of current treatment plan related to  self management and patient's adherence to plan as established by provider   Chronic Kidney Disease (Status: Goal on Track (progressing): YES.)  Long Term Goal  Last practice recorded BP readings:  BP Readings from Last 3 Encounters:  09/08/21 (!) 147/79  07/07/21 118/78  07/02/21 (!) 166/89  Most recent eGFR/CrCl:  Lab Results  Component Value Date   EGFR 43 (L) 09/08/2021    No components found for: CRCL  Assessed the patient     understanding of chronic kidney disease    Evaluation of  current treatment plan related to chronic kidney disease self management and patient's adherence to plan as established by provider      Provided education to patient re: stroke prevention, s/s of heart attack and stroke    Reviewed prescribed diet Heart Healthy Diet  Reviewed medications with patient and discussed importance of compliance    Advised patient, providing education and rationale, to monitor blood pressure daily and record, calling PCP for findings outside established parameters    Discussed complications of poorly controlled blood pressure such as heart disease, stroke, circulatory complications, vision complications, kidney impairment, sexual dysfunction    Advised patient to discuss changes in kidney function, new concerns with kidney function, questions or concerns with provider    Discussed plans with patient for ongoing care management follow up and provided patient with direct contact information for care management team    Screening for signs and symptoms of depression related to chronic disease state      Discussed the impact of chronic kidney disease on daily life and mental health and acknowledged and normalized feelings of disempowerment, fear, and frustration    Assessed social determinant of health barriers    Provided education  on kidney disease progression      History of Seizures  (Status: Goal on Track (progressing): YES.) Long Term Goal  Evaluation of current treatment plan related to  Seizures ,  self-management and patient's adherence to plan as established by provider. Discussed plans with patient for ongoing care management follow up and provided patient with direct contact information for care management team Advised patient to call the office for new onset of seizure activity, changes in mentation, or changes in health and well being related to seizures; Provided education to patient re: importance of medications compliance, routine visits for ongoing  evaluation of chronic conditions and working with the CCM team to effectively manage chronic conditions; Reviewed medications with patient and discussed compliance, education provided to the patient on the importance of taking medications as directed and not missing doses; Provided patient with safety and preventing injuries, monitoring for changes in mentation educational materials related to seizure prevention and safety; Reviewed scheduled/upcoming provider appointments including 03-09-2022 at 0800 am; Discussed plans with patient for ongoing care management follow up and provided patient with direct contact information for care management team; Advised patient to discuss changes in seizure activity, new onset of seizures, and questions or concerns related to neurological health and well being with provider;  Hyperlipidemia:  (Status: Goal on Track (progressing): YES.) Long Term Goal  Lab Results  Component Value Date   CHOL 200 (H) 09/08/2021   HDL 52 09/08/2021   LDLCALC 131 (H) 09/08/2021   TRIG 97 09/08/2021   CHOLHDL 2.8 08/23/2018     Medication review performed; medication list updated in electronic medical record. 01-23-2022: The patient takes Lipitor 40 mg QD for cholesterol. Denies any issues or concerns with medications Provider established cholesterol goals reviewed; Counseled on importance of regular laboratory monitoring as prescribed. 01-23-2022: The patient has regular lab testing. Will continue to monitor.  Provided HLD educational materials; Reviewed role and benefits of statin for ASCVD risk reduction; Reviewed importance of limiting foods high in cholesterol;  Hypertension: (Status: Goal on Track (progressing): YES.) Long Term Goal  Last practice recorded BP readings:  BP Readings from Last 3 Encounters:  09/08/21 (!) 147/79  07/07/21 118/78  07/02/21 (!) 166/89  Most recent eGFR/CrCl:  Lab Results  Component Value Date   EGFR 43 (L) 09/08/2021    No components  found for: CRCL Self reported blood pressure of 130/70 Evaluation of current treatment plan related to hypertension self management and patient's adherence to plan as established by provider. 01-23-2022: The patient is taking her blood pressures and states they are WNL. Denies any new concerns with HTN or heart health;   Provided education to patient re: stroke prevention, s/s of heart attack and stroke; Reviewed prescribed diet heart healthy diet  Reviewed medications with patient and discussed importance of compliance.01-23-2022: The patient is compliant with medications. Denies any needs with medications;  Discussed plans with patient for ongoing care management follow up and provided patient with direct contact information for care management team; Advised patient, providing education and rationale, to monitor blood pressure daily and record, calling PCP for findings outside established parameters;  Advised patient to discuss blood pressure trends with provider; Provided education on prescribed diet heart healthy diet ;  Discussed complications of poorly controlled blood pressure such as heart disease, stroke, circulatory complications, vision complications, kidney impairment, sexual dysfunction;  Review of stressors that can impact her blood pressures. 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 She is having some arm  pain at times in her right arm and she feels like this is due to lifting of her Mother. Education and support given. Review of discussing this with pcp at next appointment if it is not better. She takes Tylenol and this is effective with her pain relief. The patient denies any acute findings. She feels she is doing well right now.   Patient Goals/Self-Care Activities: Take medications as prescribed   Attend all scheduled provider appointments Call pharmacy for medication refills 3-7 days in advance of running out of medications Attend church or other social  activities Perform all self care activities independently  Perform IADL's (shopping, preparing meals, housekeeping, managing finances) independently Call provider office for new concerns or questions  Work with the social worker to address care coordination needs and will continue to work with the clinical team to address health care and disease management related needs call the Suicide and Crisis Lifeline: 988 call the [place] National Suicide Prevention Lifeline: (619)814-8843 or TTY: 4132606986 TTY (670)403-8235) to talk to a trained counselor call 1-800-273-TALK (toll free, 24 hour hotline) if experiencing a Mental Health or [place]  check blood pressure 3 times per week choose a place to take my blood pressure (home, clinic or office, retail store) write blood pressure results in a log or diary learn about high blood pressure keep a blood pressure log take blood pressure log to all doctor appointments call doctor for signs and symptoms of high blood pressure develop an action plan for high blood pressure keep all doctor appointments take medications for blood pressure exactly as prescribed begin an exercise program report new symptoms to your doctor eat more whole grains, fruits and vegetables, lean meats and healthy fats - call for medicine refill 2 or 3 days before it runs out - take all medications exactly as prescribed - call doctor with any symptoms you believe are related to your medicine - call doctor when you experience any new symptoms - go to all doctor appointments as scheduled - adhere to prescribed diet: Heart Healthy Diet - develop an exercise routine       Plan:Telephone follow up appointment with care management team member scheduled for:  04-10-2022 at [place] am  Noreene Larsson RN, MSN, [facility] Mobile: [phone]

## 2022-02-13 DIAGNOSIS — C50112 Malignant neoplasm of central portion of left female breast: Secondary | ICD-10-CM | POA: Diagnosis not present

## 2022-02-13 DIAGNOSIS — Z4432 Encounter for fitting and adjustment of external left breast prosthesis: Secondary | ICD-10-CM | POA: Diagnosis not present

## 2022-02-13 DIAGNOSIS — Z9012 Acquired absence of left breast and nipple: Secondary | ICD-10-CM | POA: Diagnosis not present

## 2022-02-16 ENCOUNTER — Telehealth: Payer: Self-pay

## 2022-02-16 ENCOUNTER — Ambulatory Visit: Payer: Medicare HMO | Admitting: Licensed Clinical Social Worker

## 2022-02-16 DIAGNOSIS — M1711 Unilateral primary osteoarthritis, right knee: Secondary | ICD-10-CM

## 2022-02-16 DIAGNOSIS — N1832 Chronic kidney disease, stage 3b: Secondary | ICD-10-CM

## 2022-02-16 DIAGNOSIS — I10 Essential (primary) hypertension: Secondary | ICD-10-CM

## 2022-02-16 NOTE — Telephone Encounter (Signed)
° °  Telephone encounter was:  Successful.  02/16/2022 Name: Kelly Poole MRN: [id number] DOB: 1951/06/15  Kelly Poole is a 71 y.o. year old female who is a primary care patient of Cannady, Barbaraann Faster, NP . The community resource team was consulted for assistance with [place] guide performed the following interventions: Patient provided with information about care guide support team and interviewed to confirm resource needs.Patient cant afford to buy food because bills are so high she only gets $10 in EBT patient stated she doesn't qualify for any other help  Follow Up Plan:  Care guide will follow up with patient by phone over the next week    [place], Care Management  [phone] [address], [place], [place] [address] Phone: [phone] Email: [email or web address]

## 2022-02-16 NOTE — Chronic Care Management (AMB) (Signed)
Chronic Care Management    Clinical Social Work Note  02/16/2022 Name: Kelly Poole MRN: [id number] DOB: 1951-08-16  Kelly Poole is a 71 y.o. year old female who is a primary care patient of Cannady, Barbaraann Faster, NP. The CCM team was consulted to assist the patient with chronic disease management and/or care coordination needs related to: [employer] .   Engaged with patient by telephone for follow up visit in response to provider referral for social work chronic care management and care coordination services.   Consent to Services:  The patient was given information about Chronic Care Management services, agreed to services, and gave verbal consent prior to initiation of services.  Please see initial visit note for detailed documentation.   Patient agreed to services and consent obtained.   Assessment: Review of patient past medical history, allergies, medications, and health status, including review of relevant consultants reports was performed today as part of a comprehensive evaluation and provision of chronic care management and care coordination services.     SDOH (Social Determinants of Health) assessments and interventions performed:    Advanced Directives Status: Not addressed in this encounter.  CCM Care Plan  Allergies  Allergen Reactions   Macrobid [Nitrofurantoin Macrocrystal] Other (See Comments)    Sore throat and irritation to throat    Outpatient Encounter Medications as of 02/16/2022  Medication Sig Note   Ascorbic Acid (VITAMIN C) 1000 MG tablet Take 1,000 mg by mouth daily.    aspirin 81 MG tablet Take 81 mg by mouth daily.    atorvastatin (LIPITOR) 40 MG tablet Take 1 tablet (40 mg total) by mouth daily.    Cholecalciferol 50 MCG (2000 UT) TABS Take 1 tablet by mouth daily.    FEROSUL 325 (65 Fe) MG tablet TAKE 1 TABLET EVERY DAY WITH BREAKFAST    fluticasone (FLONASE) 50 MCG/ACT nasal spray Place 2 sprays into both nostrils daily for 14 days.     Garlic 6812 MG CAPS Take 1,000 mg by mouth daily. 10/12/2019: Doesn't take regular   lamoTRIgine (LAMICTAL) 25 MG tablet Take 2 tablets (50 mg total) by mouth 2 (two) times daily    losartan (COZAAR) 100 MG tablet Take 1 tablet (100 mg total) by mouth daily.    Multiple Vitamin (MULTIVITAMINS PO) Take by mouth daily.    omeprazole (PRILOSEC) 20 MG capsule Take 1 capsule (20 mg total) by mouth daily.    topiramate (TOPAMAX) 25 MG tablet Take by mouth.    vitamin E 100 UNIT capsule Take 100 Units by mouth daily.    No facility-administered encounter medications on file as of 02/16/2022.    Patient Active Problem List   Diagnosis Date Noted   Osteopenia of lumbar spine 01/01/2022   Persistent proteinuria 09/08/2021   Hypercalcemia 08/04/2021   Secondary hyperparathyroidism ([place]) 12/23/2020   Seizure ([place]) 12/23/2020   Anemia 07/30/2020   Osteoarthritis of right knee 09/07/2019   Vasomotor symptoms due to menopause 03/24/2019   CKD (chronic kidney disease) stage 3, GFR 30-59 ml/min ([place]) 10/05/2016   Vitamin B12 deficiency (dietary) anemia 12/27/2015   Hypertension 12/27/2015   Insomnia 06/03/2015   Thyroid goiter 06/03/2015   HX: breast cancer 06/03/2015   Gastroesophageal reflux disease 06/03/2015   Hyperlipidemia 06/03/2015    Conditions to be addressed/monitored: HTN, CKD Stage 3, and Osteoarthritis; Food insecurity  Care Plan : General Social Work (Adult)  Updates made by Rebekah Chesterfield, LCSW since 02/16/2022 12:00 AM     Problem:  Quality of Life (General Plan of Care)      Long-Range Goal: Quality of Life Maintained   Start Date: 02/10/2021  This Visit's Progress: On track  Recent Progress: On track  Priority: Medium  Note:   Timeframe:  Long-Range Goal Priority:  Medium Start Date: 02/10/21                            Expected End Date:   02/17/22                Follow Up Date- 04/20/22   Current barriers:   Patient is having depressive symptoms due to losing  independence as she is unable to drive right now  Unable to consistently perform activities of daily living and needs additional assistance / support in order to meet this unmet need Unable to  independently self manage needs related to chronic health conditions.  Knowledge deficits related to short term plan for care coordination needs and long term plans for chronic disease management needs Clinical Goals: Over the next 120 days, patient will work with SW to address concerns related to stress management, increasing self care and coping with being unable to drive at this time Interventions : Assessed needs, level of care concerns, and barriers to care Patient reports decrease in symptoms of  depression, irritability, and suicidal ideations with the discontinuation of medication (Keppra) 2/27: Patient reports difficulty affording food after paying utilities and rent. CCM LCSW completed care guide referral for Food [facility] gift card Patient reports an increase in stress triggered by recent interaction with ex-partner who is having difficulty respecting boundaries 12/05: Patient is doing well maintaining boundaries which has resulted in decreased stress CCM LCSW provided validation and encouragement. Strategies to assist in promoting healthy boundaries were discussed to promote stress management Patient continues to care for mother with diagnosis of dementia. This has resulted in patient's difficulty managing blood pressure due to stress from staying at mother's residence for five days a week 12/05: Patient reports, "Everything is good" She is doing well with the management of BP 2/27: Patient reports she is doing well and endorses a decrease in back pain. Reports management of HTN by prioritizing self and implementing self-care strategies. Patient continues to visit the gym on the weekends thorugh the [place] program Patient reports no recent seizures. She has been cleared to begin driving again and  is excited about independence Patient continues to receive strong support from family (son and brother) Patient is interested in applying for disability. CCM LCSW discussed process and provided web-site where she can apply ([email or web address]-arnold.[email or web address]/) Patient's questions regarding process were answered 09/26: Patient has decided not to apply for disability at this time. States that she receives payment from [employer] to continue providing care for her mother Patient reports that she has applied for SNAP benefits; however was offered only $10. Patient reports food insecurity. CCM LCSW completed referral to Care Guide for assistance with local resources and/or gift card 10/14: Patient was contacted by Care Guide and informed that she is eligible for assistance with food insecurity. Patient reports that she has not received gift card and is inquiring about availability. CCM LCSW collaborated with Geographical information systems officer. The gift card has not been delivered to the office. Patient was informed that she will be contacted to pick up, once the resource becomes available. Patient was appreciative for the assistance and verbalized understanding CCM LCSW reviewed upcoming appointments with  patient.  Patient denies need for any additional resources at this time CCM LCSW received an incoming call from patient at 4:53 PM-Patient reports that the [place] is increasing anxiety symptoms. This has been ongoing noting that it was worse at beginning of the year. Per patient, "I'm better now" States medication makes her irritable, has difficulty sleeping, and increases thoughts of wanting to harm someone else the devil was in me Currently she is taking medication 2 x daily. Patient reports that she has not had any seizures since January Discussed plans with patient for ongoing care management follow up and provided patient with direct contact information for care management team Other interventions provided: Solution-Focused  Strategies and Emotional/Supportive Counseling Collaboration with PCP regarding development and update of comprehensive plan of care as evidenced by provider attestation and Poole-signature Inter-disciplinary care team collaboration (see longitudinal plan of care) Patient Self Activities/Goals: Continue utilizing healthy coping skills discussed Follow up with PCP regarding request for assistance with hair loss and scheduled appointments Practice positive self-talk to promote positive mood       Christa See, MSW, [email or web address] Phone [phone] 11:30 AM

## 2022-02-16 NOTE — Patient Instructions (Signed)
Visit Information  Thank you for taking time to visit with me today. Please don't hesitate to contact me if I can be of assistance to you before our next scheduled telephone appointment.  Following are the goals we discussed today:   Patient Self Activities/Goals: Continue utilizing healthy coping skills discussed Attend all scheduled appointments and contact PCP with any questions or concerns Practice positive self-talk to promote positive mood  Our next appointment is by telephone on 04/20/22 at 10:00 AM  Please call the care guide team at [phone] if you need to cancel or reschedule your appointment.   If you are experiencing a Mental Health or [place] or need someone to talk to, please call the Suicide and Crisis Lifeline: 988 call 911   Patient verbalizes understanding of instructions and care plan provided today and agrees to view in [place]. Active MyChart status confirmed with patient.    Christa See, MSW, [email or web address] Phone [phone] 11:28 AM

## 2022-02-17 DIAGNOSIS — I10 Essential (primary) hypertension: Secondary | ICD-10-CM | POA: Diagnosis not present

## 2022-02-17 DIAGNOSIS — E7800 Pure hypercholesterolemia, unspecified: Secondary | ICD-10-CM | POA: Diagnosis not present

## 2022-02-17 DIAGNOSIS — M1711 Unilateral primary osteoarthritis, right knee: Secondary | ICD-10-CM | POA: Diagnosis not present

## 2022-02-26 ENCOUNTER — Telehealth: Payer: Self-pay

## 2022-02-26 NOTE — Telephone Encounter (Signed)
? ?  Telephone encounter was:  Successful.  ?02/26/2022 ?Name: Kelly Poole MRN: [id number] DOB: Sep 23, 1951 ? ?Kelly Poole is a 71 y.o. year old female who is a primary care patient of Cannady, Barbaraann Faster, NP . The community resource team was consulted for assistance with Food Insecurity ? ?Care guide performed the following interventions: Patient provided with information about care guide support team and interviewed to confirm resource needs. Follow up about the gift card she requested. No gift card avalible due to gift card given to her back in october and so far the resources I mailed to her has not been received yet ? ?Follow Up Plan:  Care guide will follow up with patient by phone over the next week ? ? ?Larena Sox ?Care Guide, Embedded Care Coordination ?[place], Care Management  ?[phone] ?[address], [place], [place] [address] ?Phone: [phone] ?Email: [email or web address] ? ?  ?

## 2022-03-06 NOTE — Patient Instructions (Incomplete)
Chronic Kidney Disease, Adult Chronic kidney disease is when lasting damage happens to the kidneys slowly over a long time. The kidneys help to: Make pee (urine). Make hormones. Keep the right amount of fluids and chemicals in the body. Most often, this disease does not go away. You must take steps to help keep the kidney damage from getting worse. If steps are not taken, the kidneys might stop working forever. What are the causes? Diabetes. High blood pressure. Diseases that affect the heart and blood vessels. Other kidney diseases. Diseases of the body's disease-fighting system. A problem with the flow of pee. Infections of the organs that make pee, store it, and take it out of the body. Swelling or irritation of your blood vessels. What increases the risk? Getting older. Having someone in your family who has kidney disease or kidney failure. Having a disease caused by genes. Taking medicines often that harm the kidneys. Being near or having contact with harmful substances. Being very overweight. Using tobacco now or in the past. What are the signs or symptoms? Feeling very tired. Having a swollen face, legs, ankles, or feet. Feeling like you may vomit or vomiting. Not feeling hungry. Being confused or not able to focus. Twitches and cramps in the leg muscles or other muscles. Dry, itchy skin. A taste of metal in your mouth. Making less pee, or making more pee. Shortness of breath. Trouble sleeping. You may also become anemic or get weak bones. Anemic means there is not enough red blood cells or hemoglobin in your blood. You may get symptoms slowly. You may not notice them until the kidney damage gets very bad. How is this treated? Often, there is no cure for this disease. Treatment can help with symptoms and help keep the disease from getting worse. You may need to: Avoid alcohol. Avoid foods that are high in salt, potassium, phosphorous, and protein. Take medicines for  symptoms and to help control other conditions. Have dialysis. This treatment gets harmful waste out of your body. Treat other problems that cause your kidney disease or make it worse. Follow these instructions at home: Medicines Take over-the-counter and prescription medicines only as told by your doctor. Do not take any new medicines, vitamins, or supplements unless your doctor says it is okay. Lifestyle  Do not smoke or use any products that contain nicotine or tobacco. If you need help quitting, ask your doctor. If you drink alcohol: Limit how much you use to: 0-1 drink a day for women who are not pregnant. 0-2 drinks a day for men. Know how much alcohol is in your drink. In the [place].S., one drink equals one 12 oz bottle of beer (355 mL), one 5 oz glass of wine (148 mL), or one 1 oz glass of hard liquor (44 mL). Stay at a healthy weight. If you need help losing weight, ask your doctor. General instructions  Follow instructions from your doctor about what you cannot eat or drink. Track your blood pressure at home. Tell your doctor about any changes. If you have diabetes, track your blood sugar. Exercise at least 30 minutes a day, 5 days a week. Keep your shots (vaccinations) up to date. Keep all follow-up visits. Where to find more information American Association of Kidney Patients: www.aakp.org National Kidney Foundation: www.kidney.org American Kidney Fund: www.akfinc.org Life Options: www.lifeoptions.org Kidney School: www.kidneyschool.org Contact a doctor if: Your symptoms get worse. You get new symptoms. Get help right away if: You get symptoms of end-stage kidney disease. These   include: Headaches. Losing feeling in your hands or feet. Easy bruising. Having hiccups often. Chest pain. Shortness of breath. Lack of menstrual periods, in women. You have a fever. You make less pee than normal. You have pain or you bleed when you pee or poop. These symptoms may be an  emergency. Get help right away. Call your local emergency services (911 in the U.S[place] Do not wait to see if the symptoms will go away. Do not drive yourself to the hospital. Summary Chronic kidney disease is when lasting damage happens to the kidneys slowly over a long time. Causes of this disease include diabetes and high blood pressure. Often, there is no cure for this disease. Treatment can help symptoms and help keep the disease from getting worse. Treatment may involve lifestyle changes, medicines, and dialysis. This information is not intended to replace advice given to you by your health care provider. Make sure you discuss any questions you have with your health care provider. Document Revised: 03/13/2020 Document Reviewed: 03/13/2020 Elsevier Patient Education  2022 Elsevier Inc.  

## 2022-03-09 ENCOUNTER — Ambulatory Visit: Payer: Medicare HMO | Admitting: Nurse Practitioner

## 2022-03-09 DIAGNOSIS — R801 Persistent proteinuria, unspecified: Secondary | ICD-10-CM

## 2022-03-09 DIAGNOSIS — N1832 Chronic kidney disease, stage 3b: Secondary | ICD-10-CM

## 2022-03-09 DIAGNOSIS — N2581 Secondary hyperparathyroidism of renal origin: Secondary | ICD-10-CM

## 2022-03-09 DIAGNOSIS — I10 Essential (primary) hypertension: Secondary | ICD-10-CM

## 2022-03-09 DIAGNOSIS — R569 Unspecified convulsions: Secondary | ICD-10-CM

## 2022-03-09 DIAGNOSIS — E7800 Pure hypercholesterolemia, unspecified: Secondary | ICD-10-CM

## 2022-03-21 NOTE — Patient Instructions (Signed)

## 2022-03-23 ENCOUNTER — Ambulatory Visit (INDEPENDENT_AMBULATORY_CARE_PROVIDER_SITE_OTHER): Payer: Medicare HMO

## 2022-03-23 DIAGNOSIS — E7800 Pure hypercholesterolemia, unspecified: Secondary | ICD-10-CM

## 2022-03-23 DIAGNOSIS — I10 Essential (primary) hypertension: Secondary | ICD-10-CM

## 2022-03-23 NOTE — Patient Instructions (Signed)
Ms. Obrien, ? ?Thank you for talking with me today. I have included our care plan/goals in the following pages.  ? ?Please review and call me at [phone] with any questions. ? ?Thanks! ?Edison Nasuti  ? ?Madelin Rear, PharmD ?Clinical Pharmacist  ?[phone] ? ?Care Plan : Kelly Poole  ?Updates made by Madelin Rear, [facility] since 03/23/2022 12:00 AM  ?  ? ?Problem: HTN, CKD, Anemia, secondary hyperparpthyroidiism, HLD, seizure   ?Priority: High  ?  ? ?Long-Range Goal: disease management   ?Start Date: 03/23/2022  ?Recent Progress: On track  ?Priority: High  ?Note:   ? ?Current Barriers:  ?Unable to maintain control of HLD HTN ? ?Pharmacist Clinical Goal(s):  ?Patient will achieve adherence to monitoring guidelines and medication adherence to achieve therapeutic efficacy through collaboration with PharmD and provider.  ? ?Interventions: ?1:1 collaboration with Venita Lick, NP regarding development and update of comprehensive plan of care as evidenced by provider attestation and co-signature ?Inter-disciplinary care team collaboration (see longitudinal plan of care) ?Comprehensive medication review performed; medication list updated in electronic medical record ? ?Hypertension (BP goal <140/90) ?-Not ideally controlled based on previous OV vitals. Will bring cuff in 03/25/2022 visit  ?-CKD-GFRs in 45s. Previously stable, no updates on labs. UCR has been 30-300 range. Could consider farxiga if UCR >200 mg/g or jardiance if UCR >'300mg'$ /g for use in CKD ?-Current treatment: ?Losartan 100 mg once daily ?-Medications previously tried: HCTZ, liinopril, quinapril  ?-Current home readings: reports recent reading 130/72  ?-Current dietary habits: doesn't add salt to foods ?-Current exercise habits: exercising on the weekends, spends a lot of time caring for her mother. Discussed goal of 150 minutes per week as tolerated.  ?-Denies hypotensive/hypertensive symptoms ?-Educated on Symptoms of hypotension and importance of  maintaining adequate hydration; ?-Counseled to monitor BP at home 1-2x/wk, document, and provide log at future appointments ?-Recommended to continue current medication ? ?Hyperlipidemia: (LDL goal < 100) ?-Uncontrolled - no upstate since starting on atorvastatin 40 mg daily  ?-ASCVD 10-yr 17.3% (info from 08/2021) - will be updated following updated labs/vitals ?-Current treatment: ?Atorvastatin 40 mg once daily (first increased  ?-Medications previously tried: previously on lower doses of atorvastatin.  ?-Side effect review - no problems noted, tolerating higher strength well ?-Educated on Cholesterol goals;  ?-Recommended to continue current medication ?Counseled on medication adherence, ? ?Patient Goals/Self-Care Activities ?Patient will:  ?- take medications as prescribed ?focus on medication adherence by taking medications at the same time every day  ? ?Medication Assistance: None required.  Patient affirms current coverage meets needs. ? ?Patient's preferred pharmacy is: ? ?[facility] [place] [facility] [place] ?[address] ([place]) [place] [address] ?Phone: [phone] Fax: [phone] ?Pt endorses 100% compliance ? ?Follow Up:  Patient agrees to Care Plan and Follow-up. ?Plan: HC 3 month adh review. Pharmacist 6 month telephone  ?  ? ? ?The patient verbalized understanding of instructions provided today and agreed to receive a MyChart copy of patient instruction and/or educational materials. ?Telephone follow up appointment with pharmacy team member scheduled for: See next appointment with "Care Management Staff" under "What's Next" below.   ?

## 2022-03-23 NOTE — Progress Notes (Signed)
? ?Chronic Care Management ?Pharmacy Note ? ?03/23/2022 ?Name:  Kelly Poole MRN:  [id number] DOB:  02-19-1951 ? ?Summary: ?No seizures since [place] of last yr, has neuro visit later this month. Lamictal now 32m QHS ?BPs 130/72 recently at home. No added salt in diet. Will bring in home bp cuff at upcoming visit 03/25/2022 ?CKD-GFRs in 45s Previously stable. UCR has been 30-300 range. Could consider farxiga if UCR >200 mg/g or jardiance if UCR >3034mg for use in CKD. Would be happy to help on any assistance here.  ?Reports Neph visit April or May 2023 ?Tolerating higher dose atorvastatin 40 mg, LDL >130 last OV. no updates since starting. Pcp visit 03/25/2022. ? ?Subjective: ?Kelly M ROWEN HURs an [age].o. year old female who is a primary patient of Cannady, JoBarbaraann FasterNP.  The CCM team was consulted for assistance with disease management and care coordination needs.   ? ?Engaged with patient by telephone for follow up visit in response to provider referral for pharmacy case management and/or care coordination services.  ? ?Consent to Services:  ?The patient was given information about Chronic Care Management services, agreed to services, and gave verbal consent prior to initiation of services.  Please see initial visit note for detailed documentation.  ? ?Patient Care Team: ?CaVenita LickNP as PCP - General (Nurse Practitioner) ?TaVanita InglesRN as Case Manager (General Practice) ?LeRebekah ChesterfieldLCSW as SoEducation officer, museumLicensed ClHoliday representative?PoMadelin RearRPInova [facility]oud[facility] Hospitals Pharmacist (General Practice) ? ?Hospital visits: ?None in previous 6 months ? ?Objective: ? ?Lab Results  ?Component Value Date  ? CREATININE 1.34 (H) 09/08/2021  ? CREATININE 1.29 (H) 07/07/2021  ? CREATININE 1.31 (H) 03/17/2021  ? ? ?No results found for: HGBA1C ?Last diabetic Eye exam: No results found for: HMDIABEYEEXA  ?Last diabetic Foot exam: No results found for: HMDIABFOOTEX  ? ?   ?Component Value Date/Time  ? CHOL 200 (H)  09/08/2021 0816  ? CHOL 188 07/07/2021 1024  ? CHOL 202 (H) 02/24/2021 0859  ? CHOL 178 06/03/2015 1127  ? TRIG 97 09/08/2021 0816  ? TRIG 112 07/07/2021 1024  ? TRIG 85 02/24/2021 0859  ? TRIG 93 06/03/2015 1127  ? HDL 52 09/08/2021 0816  ? HDL 55 07/07/2021 1024  ? HDL 51 02/24/2021 0859  ? CHOLHDL 2.8 08/23/2018 1635  ? VLDL 19 06/03/2015 1127  ? LDLCALC 131 (H) 09/08/2021 0816  ? LDLCALC 113 (H) 07/07/2021 1024  ? LDH[place]36 (H) 02/24/2021 0859  ? ? ? ?  Latest Ref Rng & Units 09/08/2021  ?  8:16 AM 07/07/2021  ? 10:24 AM 02/24/2021  ?  8:59 AM  ?Hepatic Function  ?Total Protein 6.0 - 8.5 g/dL 7.3   7.8   7.6    ?Albumin 3.8 - 4.8 g/dL 4.7   4.8   4.6    ?AST 0 - 40 IU/L 20   19   26     ?ALT 0 - 32 IU/L 17   15   22     ?Alk Phosphatase 44 - 121 IU/L 92   111   88    ?Total Bilirubin 0.0 - 1.2 mg/dL 0.3   <0.2   0.3    ? ? ?Lab Results  ?Component Value Date/Time  ? TSH 2.740 09/08/2021 08:16 AM  ? TSH 2.850 02/24/2021 08:59 AM  ? ? ? ?  Latest Ref Rng & Units 09/08/2021  ?  8:16 AM 07/07/2021  ? 10:24 AM  02/24/2021  ?  8:59 AM  ?CBC  ?WBC 3.4 - 10.8 x10E3/uL 6.1   8.3   6.2    ?Hemoglobin 11.1 - 15.9 g/dL 10.0   11.4   9.3    ?Hematocrit 34.0 - 46.6 % 30.3   34.2   29.5    ?Platelets 150 - 450 x10E3/uL 285   327   292    ? ? ?Lab Results  ?Component Value Date/Time  ? VD25OH 63.7 09/08/2021 08:16 AM  ? VD25OH 63.3 02/16/2020 04:24 PM  ? ? ?Clinical ASCVD:  ?The 10-year ASCVD risk score (Arnett DK, et al., 2019) is: 17.3% ?  Values used to calculate the score: ?    Age: 71 years ?    Sex: Female ?    Is Non-Hispanic African American: Yes ?    Diabetic: No ?    Tobacco smoker: No ?    Systolic Blood Pressure: 694 mmHg ?    Is BP treated: Yes ?    HDL Cholesterol: 52 mg/dL ?    Total Cholesterol: 200 mg/dL   ?Social History  ? ?Tobacco Use  ?Smoking Status Never  ?Smokeless Tobacco Never  ? ?BP Readings from Last 3 Encounters:  ?09/08/21 (!) 147/79  ?07/07/21 118/78  ?07/02/21 (!) 166/89  ? ?Pulse Readings from Last 3  Encounters:  ?09/08/21 69  ?07/07/21 74  ?02/24/21 77  ? ?Wt Readings from Last 3 Encounters:  ?10/20/21 140 lb (63.5 kg)  ?09/08/21 140 lb 9.6 oz (63.8 kg)  ?07/07/21 140 lb 3.2 oz (63.6 kg)  ? ?BMI Readings from Last 3 Encounters:  ?10/20/21 31.39 kg/m?  ?09/08/21 30.97 kg/m?  ?07/07/21 30.34 kg/m?  ? ? ?Assessment: Review of patient past medical history, allergies, medications, health status, including review of consultants reports, laboratory and other test data, was performed as part of comprehensive evaluation and provision of chronic care management services.  ? ?SDOH:  (Social Determinants of Health) assessments and interventions performed: Yes ?SDOH Interventions   ? ?Flowsheet Row Most Recent Value  ?SDOH Interventions   ?Transportation Interventions Intervention Not Indicated  ? ?  ? ? ?[place] ? ?Allergies  ?Allergen Reactions  ? Macrobid [Nitrofurantoin Macrocrystal] Other (See Comments)  ?  Sore throat and irritation to throat  ? ? ?Medications Reviewed Today   ? ? Reviewed by Madelin Rear, [facility] (Pharmacist) on 03/23/22 at 1024  Med List Status: <None>  ? ?Medication Order Taking? Sig Documenting Provider Last Dose Status Informant  ?Ascorbic Acid (VITAMIN C) 1000 MG tablet [id number] Yes Take 1,000 mg by mouth daily. [provider] Taking Active Pharmacy Records  ?aspirin 81 MG tablet [id number] Yes Take 81 mg by mouth daily. [provider] Taking Active Pharmacy Records  ?atorvastatin (LIPITOR) 40 MG tablet [id number] Yes Take 1 tablet (40 mg total) by mouth daily. Venita Lick, NP Taking Active   ?Cholecalciferol 50 MCG (2000 UT) TABS [id number] Yes Take 1 tablet by mouth daily. [provider] Taking Active   ?FEROSUL 325 (65 Fe) MG tablet [id number]  TAKE 1 TABLET EVERY DAY WITH BREAKFAST Cannady, Jolene T, NP  Active   ?fluticasone (FLONASE) 50 MCG/ACT nasal spray [id number]  Place 2 sprays into both nostrils daily for 14 days. Myles Gip, DO  Active  Pharmacy Records  ?Garlic 2010 MG CAPS [id number]  Take 1,000 mg by mouth daily. [provider]  Active   ?         ?Med Note (HILL, TIFFANY A  Thu Oct 12, 2019  3:09 PM) Doesn't take regular  ?lamoTRIgine (LAMICTAL) 25 MG tablet [id number] Yes Take 2 tablets (50 mg total) by mouth 2 (two) times daily [provider] Taking Active   ?         ?Med Note (Chris Cripps   Mon Mar 23, 2022 10:20 AM) Lamictal 25 mg once daily at night now   ?losartan (COZAAR) 100 MG tablet [id number] Yes Take 1 tablet (100 mg total) by mouth daily. Venita Lick, NP Taking Active   ?Multiple Vitamin (MULTIVITAMINS PO) [id number] Yes Take by mouth daily. [provider] Taking Active Pharmacy Records  ?omeprazole (PRILOSEC) 20 MG capsule [id number] Yes Take 1 capsule (20 mg total) by mouth daily. Marnee Guarneri T, NP Taking Active   ?topiramate (TOPAMAX) 25 MG tablet [id number]  Take by mouth. [provider]  Expired 12/17/21 2359   ?vitamin E 100 UNIT capsule [id number] Yes Take 100 Units by mouth daily. [provider] Taking Active Pharmacy Records  ? ?  ?  ? ?  ? ? ?Patient Active Problem List  ? Diagnosis Date Noted  ? Osteopenia of lumbar spine 01/01/2022  ? Persistent proteinuria 09/08/2021  ? Secondary hyperparathyroidism ([place]) 12/23/2020  ? Seizure ([place]) 12/23/2020  ? Anemia 07/30/2020  ? Osteoarthritis of right knee 09/07/2019  ? Vasomotor symptoms due to menopause 03/24/2019  ? CKD (chronic kidney disease) stage 3, GFR 30-59 ml/min ([place]) 10/05/2016  ? Vitamin B12 deficiency (dietary) anemia 12/27/2015  ? Hypertension 12/27/2015  ? Insomnia 06/03/2015  ? Thyroid goiter 06/03/2015  ? HX: breast cancer 06/03/2015  ? Gastroesophageal reflux disease 06/03/2015  ? Hyperlipidemia 06/03/2015  ? ? ?Immunization History  ?Administered Date(s) Administered  ? Moderna Sars-Covid-2 Vaccination 02/12/2020, 03/12/2020, 11/30/2020, 12/08/2020  ? Td 12/22/2003  ? ? ?Conditions to be  addressed/monitored: ?HLD HTN Insomnia  ? ?Care Plan : [place]  ?Updates made by Madelin Rear, [facility] since 03/23/2022 12:00 AM  ?  ? ?Problem: HTN, CKD, Anemia, secondary hyperparpthyroidiism, HLD, seizure   ?Priority: Hi

## 2022-03-25 ENCOUNTER — Encounter: Payer: Self-pay | Admitting: Nurse Practitioner

## 2022-03-25 ENCOUNTER — Ambulatory Visit (INDEPENDENT_AMBULATORY_CARE_PROVIDER_SITE_OTHER): Payer: Medicare HMO | Admitting: Nurse Practitioner

## 2022-03-25 VITALS — BP 140/82 | HR 74 | Temp 98.0°F | Ht <= 58 in | Wt 149.6 lb

## 2022-03-25 DIAGNOSIS — N1832 Chronic kidney disease, stage 3b: Secondary | ICD-10-CM | POA: Diagnosis not present

## 2022-03-25 DIAGNOSIS — D518 Other vitamin B12 deficiency anemias: Secondary | ICD-10-CM

## 2022-03-25 DIAGNOSIS — D631 Anemia in chronic kidney disease: Secondary | ICD-10-CM

## 2022-03-25 DIAGNOSIS — R569 Unspecified convulsions: Secondary | ICD-10-CM

## 2022-03-25 DIAGNOSIS — K219 Gastro-esophageal reflux disease without esophagitis: Secondary | ICD-10-CM

## 2022-03-25 DIAGNOSIS — E049 Nontoxic goiter, unspecified: Secondary | ICD-10-CM | POA: Diagnosis not present

## 2022-03-25 DIAGNOSIS — N2581 Secondary hyperparathyroidism of renal origin: Secondary | ICD-10-CM

## 2022-03-25 DIAGNOSIS — E7800 Pure hypercholesterolemia, unspecified: Secondary | ICD-10-CM | POA: Diagnosis not present

## 2022-03-25 DIAGNOSIS — M8588 Other specified disorders of bone density and structure, other site: Secondary | ICD-10-CM

## 2022-03-25 DIAGNOSIS — R801 Persistent proteinuria, unspecified: Secondary | ICD-10-CM | POA: Diagnosis not present

## 2022-03-25 DIAGNOSIS — I10 Essential (primary) hypertension: Secondary | ICD-10-CM | POA: Diagnosis not present

## 2022-03-25 LAB — MICROALBUMIN, URINE WAIVED
Creatinine, Urine Waived: 200 mg/dL (ref 10–300)
Microalb, Ur Waived: 150 mg/L — ABNORMAL HIGH (ref 0–19)

## 2022-03-25 MED ORDER — AMLODIPINE BESYLATE 2.5 MG PO TABS: 2.5000 mg | ORAL_TABLET | Freq: Every day | ORAL | 4 refills | Status: DC

## 2022-03-25 NOTE — Assessment & Plan Note (Signed)
Ongoing, CKD 3.  Continue Losartan for kidney protection.  CMP, PTH, and urine ALB today.  Continue to collaborate with nephrology, scheduled to see next in May, appreciate their input. ?

## 2022-03-25 NOTE — Progress Notes (Signed)
? ?BP 140/82 (BP Location: Left Arm, Patient Position: Sitting, Cuff Size: Normal)   Pulse 74   Temp 98 ?F (36.7 ?C) (Oral)   Ht 4' 8"  (1.422 m)   Wt 149 lb 9.6 oz (67.9 kg)   SpO2 100%   BMI 33.54 kg/m?   ? ?Subjective:  ? ? Patient ID: Kelly Poole, female    DOB: 03-22-1951, 71 y.o.   MRN: [id number] ? ?HPI: ?Kelly Poole is a 71 y.o. female ? ?Chief Complaint  ?Patient presents with  ? Hyperlipidemia  ? Hypertension  ? Seizures  ? Anemia  ? Gastroesophageal Reflux  ? Chronic Kidney Disease  ? ?SEIZURE DISORDER: ?Followed by neuro for past seizure issues = seen 09/18/21.  Is currently on lower dose Lamictal due to side effects -- hair loss -- she was told she could slowly stop this, but has been afraid to do this.  She wants to wait until a repeat scan is done.  She tried Keppra in past and this caused her to have irritability, depression, SI/HI. Goes back to neurology on 04/10/22. Has not had a seizure in one year. ? ?HYPERTENSION / HYPERLIPIDEMIA ?Continues on Losartan 100 MG daily and Atorvastatin 20 MG daily for HLD.  Has not taken medication this morning. ? ?In past has been on Lisinopril, Amlodipine, and HCTZ.   ?Satisfied with current treatment? yes ?Duration of hypertension: chronic ?BP monitoring frequency: daily ?BP range: 140/70-80 ?BP medication side effects: no ?Duration of hyperlipidemia: chronic ?Cholesterol medication side effects: no ?Cholesterol supplements: none ?Medication compliance: good compliance ?Aspirin: no ?Recent stressors: no ?Recurrent headaches: no ?Visual changes: no ?Palpitations: no ?Dyspnea: no ?Chest pain: no ?Lower extremity edema: no ?Dizzy/lightheaded: no  ?The 10-year ASCVD risk score (Arnett DK, et al., 2019) is: 12.9% ?  Values used to calculate the score: ?    Age: 63 years ?    Sex: Female ?    Is Non-Hispanic African American: Yes ?    Diabetic: No ?    Tobacco smoker: No ?    Systolic Blood Pressure: 962 mmHg ?    Is BP treated: Yes ?    HDL Cholesterol:  52 mg/dL ?    Total Cholesterol: 200 mg/dL  ?  ?GERD ?Continues on Prilosec 20 MG daily. ?GERD control status: stable  ?Satisfied with current treatment? yes ?Heartburn frequency: none ?Medication side effects: no  ?Medication compliance: stable ?Previous GERD medications: OTC medications ?Antacid use frequency:  none ?Dysphagia: no ?Odynophagia:  no ?Hematemesis: no ?Blood in stool: no ?EGD: no ?  ?CHRONIC KIDNEY DISEASE ?Saw nephrology last on 08/04/21. Taking iron daily for past anemia in CKD. ?CKD status: stable ?Medications renally dose: no ?Previous renal evaluation: no ?Pneumovax:   refused ?Influenza Vaccine:  refused ?  ?VITAMIN B12 DEFICIENCY: ?Continues on daily supplement.  No current supplement due to past levels being above goal. Takes daily Vitamin D supplement for osteopenia noted on DEXA scan 01/01/22, no recent falls or fractures.  ?  ?Relevant past medical, surgical, family and social history reviewed and updated as indicated. Interim medical history since our last visit reviewed. ?Allergies and medications reviewed and updated. ? ?Review of Systems  ?Constitutional:  Negative for activity change, appetite change, diaphoresis, fatigue and fever.  ?Respiratory:  Negative for cough, chest tightness and shortness of breath.   ?Cardiovascular:  Negative for chest pain, palpitations and leg swelling.  ?Gastrointestinal: Negative.   ?Neurological: Negative.   ?Psychiatric/Behavioral: Negative.    ? ?Per HPI unless  specifically indicated above ? ?   ?Objective:  ?  ?BP 140/82 (BP Location: Left Arm, Patient Position: Sitting, Cuff Size: Normal)   Pulse 74   Temp 98 ?F (36.7 ?C) (Oral)   Ht 4' 8"  (1.422 m)   Wt 149 lb 9.6 oz (67.9 kg)   SpO2 100%   BMI 33.54 kg/m?   ?Wt Readings from Last 3 Encounters:  ?03/25/22 149 lb 9.6 oz (67.9 kg)  ?10/20/21 140 lb (63.5 kg)  ?09/08/21 140 lb 9.6 oz (63.8 kg)  ?  ?Physical Exam ?Vitals and nursing note reviewed.  ?Constitutional:   ?   General: She is awake. She  is not in acute distress. ?   Appearance: She is well-developed, well-groomed and overweight. She is not ill-appearing.  ?HENT:  ?   Head: Normocephalic.  ?   Right Ear: Hearing normal.  ?   Left Ear: Hearing normal.  ?Eyes:  ?   General: Lids are normal.     ?   Right eye: No discharge.     ?   Left eye: No discharge.  ?   Conjunctiva/sclera: Conjunctivae normal.  ?   Pupils: Pupils are equal, round, and reactive to light.  ?Neck:  ?   Thyroid: No thyromegaly.  ?   Vascular: No carotid bruit.  ?Cardiovascular:  ?   Rate and Rhythm: Normal rate and regular rhythm.  ?   Heart sounds: Normal heart sounds. No murmur heard. ?  No gallop.  ?Pulmonary:  ?   Effort: Pulmonary effort is normal. No accessory muscle usage or respiratory distress.  ?   Breath sounds: Normal breath sounds.  ?Abdominal:  ?   General: Bowel sounds are normal.  ?   Palpations: Abdomen is soft.  ?Musculoskeletal:  ?   Cervical back: Normal range of motion and neck supple.  ?   Right lower leg: No edema.  ?   Left lower leg: No edema.  ?Lymphadenopathy:  ?   Cervical: No cervical adenopathy.  ?Skin: ?   General: Skin is warm and dry.  ?Neurological:  ?   Mental Status: She is alert and oriented to person, place, and time.  ?Psychiatric:     ?   Attention and Perception: Attention normal.     ?   Mood and Affect: Mood normal.     ?   Speech: Speech normal.     ?   Behavior: Behavior normal. Behavior is cooperative.     ?   Thought Content: Thought content normal.  ? ?Results for orders placed or performed in visit on 09/08/21  ?CBC with Differential/Platelet  ?Result Value Ref Range  ? WBC 6.1 3.4 - 10.8 x10E3/uL  ? RBC 3.31 (L) 3.77 - 5.28 x10E6/uL  ? Hemoglobin 10.0 (L) 11.1 - 15.9 g/dL  ? Hematocrit 30.3 (L) 34.0 - 46.6 %  ? MCV 92 79 - 97 fL  ? MCH 30.2 26.6 - 33.0 pg  ? MCHC 33.0 31.5 - 35.7 g/dL  ? RDW 12.8 11.7 - 15.4 %  ? Platelets 285 150 - 450 x10E3/uL  ? Neutrophils 58 Not Estab. %  ? Lymphs 29 Not Estab. %  ? Monocytes 9 Not Estab. %  ?  Eos 3 Not Estab. %  ? Basos 1 Not Estab. %  ? Neutrophils Absolute 3.5 1.4 - 7.0 x10E3/uL  ? Lymphocytes Absolute 1.8 0.7 - 3.1 x10E3/uL  ? Monocytes Absolute 0.6 0.1 - 0.9 x10E3/uL  ? EOS (ABSOLUTE) 0.2 0.0 -  0.4 x10E3/uL  ? Basophils Absolute 0.1 0.0 - 0.2 x10E3/uL  ? Immature Granulocytes 0 Not Estab. %  ? Immature Grans (Abs) 0.0 0.0 - 0.1 x10E3/uL  ?Comprehensive metabolic panel  ?Result Value Ref Range  ? Glucose 98 65 - 99 mg/dL  ? BUN 23 8 - 27 mg/dL  ? Creatinine, Ser 1.34 (H) 0.57 - 1.00 mg/dL  ? eGFR 43 (L) >59 mL/min/1.73  ? BUN/Creatinine Ratio 17 12 - 28  ? Sodium 142 134 - 144 mmol/L  ? Potassium 4.4 3.5 - 5.2 mmol/L  ? Chloride 105 96 - 106 mmol/L  ? CO2 19 (L) 20 - 29 mmol/L  ? Calcium 9.8 8.7 - 10.3 mg/dL  ? Total Protein 7.3 6.0 - 8.5 g/dL  ? Albumin 4.7 3.8 - 4.8 g/dL  ? Globulin, Total 2.6 1.5 - 4.5 g/dL  ? Albumin/Globulin Ratio 1.8 1.2 - 2.2  ? Bilirubin Total 0.3 0.0 - 1.2 mg/dL  ? Alkaline Phosphatase 92 44 - 121 IU/L  ? AST 20 0 - 40 IU/L  ? ALT 17 0 - 32 IU/L  ?Lipid Panel w/o Chol/HDL Ratio  ?Result Value Ref Range  ? Cholesterol, Total 200 (H) 100 - 199 mg/dL  ? Triglycerides 97 0 - 149 mg/dL  ? HDL 52 >39 mg/dL  ? VLDL Cholesterol Cal 17 5 - 40 mg/dL  ? LDL Chol Calc (NIH) 131 (H) 0 - 99 mg/dL  ?TSH  ?Result Value Ref Range  ? TSH 2.740 0.450 - 4.500 uIU/mL  ?VITAMIN D 25 Hydroxy (Vit-D Deficiency, Fractures)  ?Result Value Ref Range  ? Vit D, 25-Hydroxy 63.7 30.0 - 100.0 ng/mL  ?Vitamin B12  ?Result Value Ref Range  ? Vitamin B-12 1,062 232 - 1,245 pg/mL  ?Microalbumin, Urine Waived  ?Result Value Ref Range  ? Microalb, Ur Waived 80 (H) 0 - 19 mg/L  ? Creatinine, Urine Waived 200 10 - 300 mg/dL  ? Microalb/Creat Ratio 30-300 (H) <30 mg/g  ?Iron, TIBC and Ferritin Panel  ?Result Value Ref Range  ? Total Iron Binding Capacity 222 (L) 250 - 450 ug/dL  ? UIBC 159 118 - 369 ug/dL  ? Iron 63 27 - 139 ug/dL  ? Iron Saturation 28 15 - 55 %  ? Ferritin 322 (H) 15 - 150 ng/mL  ?Magnesium   ?Result Value Ref Range  ? Magnesium 1.9 1.6 - 2.3 mg/dL  ? ?   ?Assessment & Plan:  ? ?Problem List Items Addressed This Visit   ? ?  ? Cardiovascular and Mediastinum  ? Hypertension  ?  Chronic, ongoing with BP slight

## 2022-03-25 NOTE — Assessment & Plan Note (Signed)
Chronic, stable.  Continue to collaborate with nephrology.  PTH on labs today. ?

## 2022-03-25 NOTE — Assessment & Plan Note (Signed)
Ongoing on labs with underlying CKD.  Continue Losartan for kidney protection and check urine ALB today. ?

## 2022-03-25 NOTE — Assessment & Plan Note (Signed)
History of reported by patient.  Check TSH, Free T4, and thyroid antibody today. ?

## 2022-03-25 NOTE — Assessment & Plan Note (Signed)
Chronic, ongoing with BP slightly elevated in office today and slightly above goal at home. Will continue Losartan 100 MG and add on low dose Amlodipine 2.5 MG which may offer benefit to SBP level.  Recommend she monitor BP at home at least as few mornings a week and document, report to provider if consistent elevation >130/90.  DASH diet focus.  CMP, TSH, urine ALB, and CBC today.  Return in 6 months. ?

## 2022-03-25 NOTE — Assessment & Plan Note (Signed)
Chronic, stable on Omeprazole.  Reports return of symptoms without medication, when has trialed GDR in past.  Continue current dose and adjust as needed.  Mag level annually.  Return in 6 months. 

## 2022-03-25 NOTE — Assessment & Plan Note (Signed)
Chronic, stable.  No current supplement as levels have been stable. Check B12 level today and adjust regimen as needed. 

## 2022-03-25 NOTE — Assessment & Plan Note (Signed)
Chronic, stable at this time.  Continue to collaborate with neurology and continue current medication regimen as prescribed by them. 

## 2022-03-25 NOTE — Assessment & Plan Note (Signed)
Chronic, ongoing.  Continue daily iron supplement.  Recheck iron, B12, and CBC today.  Return to hematology as needed. 

## 2022-03-25 NOTE — Assessment & Plan Note (Signed)
Noted on DEXA 01/01/22.  At this time continue Vitamin D supplement daily and adjust as needed.  Plan on repeat DEXA around 01/01/27. ?

## 2022-03-25 NOTE — Assessment & Plan Note (Signed)
Chronic, ongoing.  Continue current medication regimen and adjust as needed. Lipid panel today. 

## 2022-03-26 ENCOUNTER — Encounter: Payer: Self-pay | Admitting: Nurse Practitioner

## 2022-03-26 NOTE — Progress Notes (Signed)
Contacted via [place] ? ? ?Good afternoon Chantae, your labs have returned: ?- CBC continues to show low hemoglobin and hematocrit, however iron level and B12 are normal.  I do recommend scheduling to get back into hematology to further assess and if all normal there we may get you into GI to ensure no bleeding in the stomach area causing low levels.  Ferritin level remains elevated, again recommend you schedule with hematology for follow-up. ?- Kidney function continues to show kidney disease stage 3a, this is remaining stable with no worsening.  Continue Losartan for kidney protection.  Liver function is normal, AST and ALT. ?- Cholesterol levels continue to show LDL above goal, are you taking Atorvastatin daily?  If so we may want to increase dose to 80 MG or change to Rosuvastatin.  ?- Awaiting parathyroid lab and will alert you when this returns. ?- Thyroid lab and Vitamin D remain normal.  Any questions? ?Keep being stellar!!  Thank you for allowing me to participate in your care.  I appreciate you. ?Kindest regards, ?Jeralynn Vaquera ?

## 2022-03-27 ENCOUNTER — Encounter: Payer: Self-pay | Admitting: Nurse Practitioner

## 2022-03-27 LAB — PTH, INTACT AND CALCIUM: PTH: 49 pg/mL (ref 15–65)

## 2022-03-27 LAB — COMPREHENSIVE METABOLIC PANEL
ALT: 30 IU/L (ref 0–32)
AST: 30 IU/L (ref 0–40)
Albumin/Globulin Ratio: 1.8 (ref 1.2–2.2)
Albumin: 4.8 g/dL (ref 3.8–4.8)
Alkaline Phosphatase: 122 IU/L — ABNORMAL HIGH (ref 44–121)
BUN/Creatinine Ratio: 12 (ref 12–28)
BUN: 15 mg/dL (ref 8–27)
Bilirubin Total: 0.3 mg/dL (ref 0.0–1.2)
CO2: 23 mmol/L (ref 20–29)
Calcium: 9.6 mg/dL (ref 8.7–10.3)
Chloride: 103 mmol/L (ref 96–106)
Creatinine, Ser: 1.21 mg/dL — ABNORMAL HIGH (ref 0.57–1.00)
Globulin, Total: 2.7 g/dL (ref 1.5–4.5)
Glucose: 96 mg/dL (ref 70–99)
Potassium: 4.4 mmol/L (ref 3.5–5.2)
Sodium: 141 mmol/L (ref 134–144)
Total Protein: 7.5 g/dL (ref 6.0–8.5)
eGFR: 49 mL/min/{1.73_m2} — ABNORMAL LOW (ref >59)

## 2022-03-27 LAB — FERRITIN: Ferritin: 364 ng/mL — ABNORMAL HIGH (ref 15–150)

## 2022-03-27 LAB — T4, FREE: Free T4: 1.3 ng/dL (ref 0.82–1.77)

## 2022-03-27 LAB — LIPID PANEL W/O CHOL/HDL RATIO
Cholesterol, Total: 187 mg/dL (ref 100–199)
HDL: 60 mg/dL (ref >39)
LDL Chol Calc (NIH): 108 mg/dL — ABNORMAL HIGH (ref 0–99)
Triglycerides: 105 mg/dL (ref 0–149)
VLDL Cholesterol Cal: 19 mg/dL (ref 5–40)

## 2022-03-27 LAB — IRON AND TIBC
Iron Saturation: 33 % (ref 15–55)
Iron: 74 ug/dL (ref 27–139)
Total Iron Binding Capacity: 225 ug/dL — ABNORMAL LOW (ref 250–450)
UIBC: 151 ug/dL (ref 118–369)

## 2022-03-27 LAB — CBC WITH DIFFERENTIAL/PLATELET
Basophils Absolute: 0 10*3/uL (ref 0.0–0.2)
Basos: 1 %
EOS (ABSOLUTE): 0.2 10*3/uL (ref 0.0–0.4)
Eos: 4 %
Hematocrit: 32.4 % — ABNORMAL LOW (ref 34.0–46.6)
Hemoglobin: 10.8 g/dL — ABNORMAL LOW (ref 11.1–15.9)
Immature Grans (Abs): 0 10*3/uL (ref 0.0–0.1)
Immature Granulocytes: 0 %
Lymphocytes Absolute: 2.1 10*3/uL (ref 0.7–3.1)
Lymphs: 38 %
MCH: 31.2 pg (ref 26.6–33.0)
MCHC: 33.3 g/dL (ref 31.5–35.7)
MCV: 94 fL (ref 79–97)
Monocytes Absolute: 0.5 10*3/uL (ref 0.1–0.9)
Monocytes: 8 %
Neutrophils Absolute: 2.6 10*3/uL (ref 1.4–7.0)
Neutrophils: 49 %
Platelets: 268 10*3/uL (ref 150–450)
RBC: 3.46 x10E6/uL — ABNORMAL LOW (ref 3.77–5.28)
RDW: 12.7 % (ref 11.7–15.4)
WBC: 5.5 10*3/uL (ref 3.4–10.8)

## 2022-03-27 LAB — VITAMIN D 25 HYDROXY (VIT D DEFICIENCY, FRACTURES): Vit D, 25-Hydroxy: 60.5 ng/mL (ref 30.0–100.0)

## 2022-03-27 LAB — TSH: TSH: 3.17 u[IU]/mL (ref 0.450–4.500)

## 2022-03-27 LAB — THYROID PEROXIDASE ANTIBODY: Thyroperoxidase Ab SerPl-aCnc: <9 IU/mL (ref 0–34)

## 2022-03-27 LAB — VITAMIN B12: Vitamin B-12: 876 pg/mL (ref 232–1245)

## 2022-04-10 ENCOUNTER — Ambulatory Visit: Payer: Self-pay

## 2022-04-10 ENCOUNTER — Telehealth: Payer: Medicare HMO

## 2022-04-10 DIAGNOSIS — N1832 Chronic kidney disease, stage 3b: Secondary | ICD-10-CM

## 2022-04-10 DIAGNOSIS — E7800 Pure hypercholesterolemia, unspecified: Secondary | ICD-10-CM

## 2022-04-10 DIAGNOSIS — R569 Unspecified convulsions: Secondary | ICD-10-CM

## 2022-04-10 DIAGNOSIS — I10 Essential (primary) hypertension: Secondary | ICD-10-CM

## 2022-04-10 NOTE — Patient Instructions (Signed)
Visit Information ? ?Thank you for taking time to visit with me today. Please don't hesitate to contact me if I can be of assistance to you before our next scheduled telephone appointment. ? ?Following are the goals we discussed today:  ?RNCM Clinical Goal(s):  ?Patient will verbalize basic understanding of HTN, HLD, CKD Stage 3, and Seizures disease process and self health management plan as evidenced by keeping appointments, stable blood pressures, no new seizure activity, and working with the CCM team to optimize health and well being ?take all medications exactly as prescribed and will call provider for medication related questions as evidenced by compliance with medications and calling for refills before running out of medications    ?attend all scheduled medical appointments: 09-09-2022 at 0800 am as evidenced by keeping appointments and call for schedule change needs         ?demonstrate improved and ongoing health management independence as evidenced by stable chronic conditions, calling the office for changes or questions and utilization of resources to help with effective management of chronic conditions        ?demonstrate ongoing self health care management ability for effective management of Chronic conditions as evidenced by working with the CCM team through collaboration with Consulting civil engineer, provider, and care team.  ?  ?Interventions: ?1:1 collaboration with primary care provider regarding development and update of comprehensive plan of care as evidenced by provider attestation and co-signature ?Inter-disciplinary care team collaboration (see longitudinal plan of care) ?Evaluation of current treatment plan related to  self management and patient's adherence to plan as established by provider ?  ?  ?Chronic Kidney Disease (Status: Goal on Track (progressing): YES.)  Long Term Goal  ?Last practice recorded BP readings:  ?   ?BP Readings from Last 3 Encounters:  ?03/25/22 140/82  ?09/08/21 (!) 147/79   ?07/07/21 118/78  ?Most recent eGFR/CrCl:  ?     ?Lab Results  ?Component Value Date  ?  EGFR 49 (L) 03/25/2022  ?  No components found for: CRCL ?  ?Assessed the patient understanding of chronic kidney disease. 04-10-2022: The patient has a good understanding of CKD. She ask questions when she does not understand. She is compliant with provider visits and plan of care  ?Evaluation of current treatment plan related to chronic kidney disease self management and patient's adherence to plan as established by provider. 04-10-2022: The patient is doing well and denies any new concerns related to kidney function. She states that she is feeling well.       ?Provided education to patient re: stroke prevention, s/s of heart attack and stroke    ?Reviewed prescribed diet Heart Healthy Diet  ?Reviewed medications with patient and discussed importance of compliance . 04-10-2022: The patient is compliant with medications.   ?Advised patient, providing education and rationale, to monitor blood pressure daily and record, calling PCP for findings outside established parameters. 04-10-2022: The patient has a good understanding of keeping blood pressures under control.   ?Discussed complications of poorly controlled blood pressure such as heart disease, stroke, circulatory complications, vision complications, kidney impairment, sexual dysfunction    ?Advised patient to discuss changes in kidney function, new concerns with kidney function, questions or concerns with provider    ?Discussed plans with patient for ongoing care management follow up and provided patient with direct contact information for care management team    ?Screening for signs and symptoms of depression related to chronic disease state      ?Discussed the  impact of chronic kidney disease on daily life and mental health and acknowledged and normalized feelings of disempowerment, fear, and frustration    ?Assessed social determinant of health barriers    ?Provided  education on kidney disease progression    ?  ?  ?History of Seizures  (Status: Goal on Track (progressing): YES.) Long Term Goal  ?Evaluation of current treatment plan related to  Seizures ,  self-management and patient's adherence to plan as established by provider. 04-10-2022: The patient denies any issues with new seizure activity. Is doing well. Knows what to look for for changes and new onset of seizure activity ?Discussed plans with patient for ongoing care management follow up and provided patient with direct contact information for care management team ?Advised patient to call the office for new onset of seizure activity, changes in mentation, or changes in health and well being related to seizures; ?Provided education to patient re: importance of medications compliance, routine visits for ongoing evaluation of chronic conditions and working with the CCM team to effectively manage chronic conditions; ?Reviewed medications with patient and discussed compliance, education provided to the patient on the importance of taking medications as directed and not missing doses. 04-10-2022: States compliance with medications. ; ?Provided patient with safety and preventing injuries, monitoring for changes in mentation educational materials related to seizure prevention and safety; ?Reviewed scheduled/upcoming provider appointments including 03-09-2022 at 0800 am; ?Discussed plans with patient for ongoing care management follow up and provided patient with direct contact information for care management team; ?Advised patient to discuss changes in seizure activity, new onset of seizures, and questions or concerns related to neurological health and well being with provider; ?  ?Hyperlipidemia:  (Status: Goal on Track (progressing): YES.) Long Term Goal  ?     ?Lab Results  ?Component Value Date  ?  CHOL 187 03/25/2022  ?  HDL 60 03/25/2022  ?  LDLCALC 108 (H) 03/25/2022  ?  TRIG 105 03/25/2022  ?  CHOLHDL 2.8 08/23/2018  ?  ?   ?Medication review performed; medication list updated in electronic medical record. 04-10-2022: The patient takes Lipitor 40 mg QD for cholesterol. Denies any issues or concerns with medications. The patient is a little above goal and the pcp may consider increasing dose of Lipitor.  ?Provider established cholesterol goals reviewed. 04-10-2022: The patient is slightly above goal with LDL. Education and support given; ?Counseled on importance of regular laboratory monitoring as prescribed. 04-10-2022: The patient has regular lab testing. Will continue to monitor.  ?Provided HLD educational materials; ?Reviewed role and benefits of statin for ASCVD risk reduction; ?Reviewed importance of limiting foods high in cholesterol. 04-10-2022: Review of foods that will help with lowering blood cholesterol.  ?  ?Hypertension: (Status: Goal on Track (progressing): YES.) Long Term Goal  ?Last practice recorded BP readings:  ?   ?BP Readings from Last 3 Encounters:  ?03/25/22 140/82  ?09/08/21 (!) 147/79  ?07/07/21 118/78  ?Most recent eGFR/CrCl:  ?     ?Lab Results  ?Component Value Date  ?  EGFR 49 (L) 03/25/2022  ?  No components found for: CRCL ?Self reported blood pressure of 130/70 ?Evaluation of current treatment plan related to hypertension self management and patient's adherence to plan as established by provider. 04-10-2022: The patient is taking her blood pressures and states they are WNL. Denies any new concerns with HTN or heart health;   ?Provided education to patient re: stroke prevention, s/s of heart attack and stroke; ?Reviewed prescribed diet  heart healthy diet  ?Reviewed medications with patient and discussed importance of compliance. 04-10-2022: The patient is compliant with medications. Denies any needs with medications. Has started taking Norvasc at night because she noticed swelling in her feet and legs. She said since she switched to night she can tell a big difference and her shoes are not as tight. ;   ?Discussed plans with patient for ongoing care management follow up and provided patient with direct contact information for care management team; ?Advised patient, providing education and rationale, to monitor bloo

## 2022-04-10 NOTE — Chronic Care Management (AMB) (Signed)
?Chronic Care Management  ? ?CCM RN Visit Note ? ?04/10/2022 ?Name: Kelly Poole MRN: [id number] DOB: Dec 07, 1951 ? ?Subjective: ?Kelly Poole is a 71 y.o. year old female who is a primary care patient of Cannady, Barbaraann Faster, NP. The care management team was consulted for assistance with disease management and care coordination needs.   ? ?Engaged with patient by telephone for follow up visit in response to provider referral for case management and/or care coordination services.  ? ?Consent to Services:  ?The patient was given information about Chronic Care Management services, agreed to services, and gave verbal consent prior to initiation of services.  Please see initial visit note for detailed documentation.  ? ?Patient agreed to services and verbal consent obtained.  ? ?Assessment: Review of patient past medical history, allergies, medications, health status, including review of consultants reports, laboratory and other test data, was performed as part of comprehensive evaluation and provision of chronic care management services.  ? ?SDOH (Social Determinants of Health) assessments and interventions performed:   ? ?CCM Care Plan ? ?Allergies  ?Allergen Reactions  ? Macrobid [Nitrofurantoin Macrocrystal] Other (See Comments)  ?  Sore throat and irritation to throat  ? ? ?Outpatient Encounter Medications as of 04/10/2022  ?Medication Sig Note  ? amLODipine (NORVASC) 2.5 MG tablet Take 1 tablet (2.5 mg total) by mouth daily.   ? Ascorbic Acid (VITAMIN C) 1000 MG tablet Take 1,000 mg by mouth daily.   ? aspirin 81 MG tablet Take 81 mg by mouth daily.   ? atorvastatin (LIPITOR) 40 MG tablet Take 1 tablet (40 mg total) by mouth daily.   ? Cholecalciferol 50 MCG (2000 UT) TABS Take 1 tablet by mouth daily.   ? FEROSUL 325 (65 Fe) MG tablet TAKE 1 TABLET EVERY DAY WITH BREAKFAST   ? Garlic 0626 MG CAPS Take 1,000 mg by mouth daily. 10/12/2019: Doesn't take regular  ? lamoTRIgine (LAMICTAL) 25 MG tablet Take 2 tablets  (50 mg total) by mouth 2 (two) times daily 03/23/2022: Lamictal 25 mg once daily at night now   ? losartan (COZAAR) 100 MG tablet Take 1 tablet (100 mg total) by mouth daily.   ? Multiple Vitamin (MULTIVITAMINS PO) Take by mouth daily.   ? omeprazole (PRILOSEC) 20 MG capsule Take 1 capsule (20 mg total) by mouth daily.   ? vitamin E 100 UNIT capsule Take 100 Units by mouth daily.   ? ?No facility-administered encounter medications on file as of 04/10/2022.  ? ? ?Patient Active Problem List  ? Diagnosis Date Noted  ? Osteopenia of lumbar spine 01/01/2022  ? Persistent proteinuria 09/08/2021  ? Secondary hyperparathyroidism ([place]) 12/23/2020  ? Seizure ([place]) 12/23/2020  ? Anemia 07/30/2020  ? Osteoarthritis of right knee 09/07/2019  ? Vasomotor symptoms due to menopause 03/24/2019  ? CKD (chronic kidney disease) stage 3, GFR 30-59 ml/min ([place]) 10/05/2016  ? Vitamin B12 deficiency (dietary) anemia 12/27/2015  ? Hypertension 12/27/2015  ? Insomnia 06/03/2015  ? Thyroid goiter 06/03/2015  ? HX: breast cancer 06/03/2015  ? Gastroesophageal reflux disease 06/03/2015  ? Hyperlipidemia 06/03/2015  ? ? ?Conditions to be addressed/monitored:HTN, HLD, CKD Stage 3, and History of seizures ? ?Care Plan : RNCM: HLD Management  ?Updates made by Vanita Ingles, RN since 04/10/2022 12:00 AM  ?Completed 04/10/2022  ? ?Care Plan : RNCM: General Plan of Care (Adult) for Chronic Disease Management and Care Coordination Needs  ?Updates made by Vanita Ingles, RN since 04/10/2022 12:00 AM  ?  ? ?  Problem: RNCM: Development of Plan of Care for Chronic Disease Management ( HTN, HLD,CKD3, Seizures)   ?Priority: High  ?  ? ?Long-Range Goal: RNCM: Effective Management of Plan of Care for Chronic Disease Management ( HTN, HLD,CKD3, Seizures)   ?Start Date: 01/23/2022  ?Expected End Date: 01/23/2023  ?Priority: High  ?Note:   ?Current Barriers:  ?Knowledge Deficits related to plan of care for management of HTN, HLD, CKD Stage 3, and Seizures  ?Chronic  Disease Management support and education needs related to HTN, HLD, CKD Stage 3, and Seizures ? ?RNCM Clinical Goal(s):  ?Patient will verbalize basic understanding of HTN, HLD, CKD Stage 3, and Seizures disease process and self health management plan as evidenced by keeping appointments, stable blood pressures, no new seizure activity, and working with the CCM team to optimize health and well being ?take all medications exactly as prescribed and will call provider for medication related questions as evidenced by compliance with medications and calling for refills before running out of medications    ?attend all scheduled medical appointments: 09-09-2022 at 0800 am as evidenced by keeping appointments and call for schedule change needs         ?demonstrate improved and ongoing health management independence as evidenced by stable chronic conditions, calling the office for changes or questions and utilization of resources to help with effective management of chronic conditions        ?demonstrate ongoing self health care management ability for effective management of Chronic conditions as evidenced by working with the CCM team through collaboration with Consulting civil engineer, provider, and care team.  ? ?Interventions: ?1:1 collaboration with primary care provider regarding development and update of comprehensive plan of care as evidenced by provider attestation and co-signature ?Inter-disciplinary care team collaboration (see longitudinal plan of care) ?Evaluation of current treatment plan related to  self management and patient's adherence to plan as established by provider ? ? ?Chronic Kidney Disease (Status: Goal on Track (progressing): YES.)  Long Term Goal  ?Last practice recorded BP readings:  ?BP Readings from Last 3 Encounters:  ?03/25/22 140/82  ?09/08/21 (!) 147/79  ?07/07/21 118/78  ?Most recent eGFR/CrCl:  ?Lab Results  ?Component Value Date  ? EGFR 49 (L) 03/25/2022  ?  No components found for:  CRCL ? ?Assessed the patient understanding of chronic kidney disease. 04-10-2022: The patient has a good understanding of CKD. She ask questions when she does not understand. She is compliant with provider visits and plan of care  ?Evaluation of current treatment plan related to chronic kidney disease self management and patient's adherence to plan as established by provider. 04-10-2022: The patient is doing well and denies any new concerns related to kidney function. She states that she is feeling well.       ?Provided education to patient re: stroke prevention, s/s of heart attack and stroke    ?Reviewed prescribed diet Heart Healthy Diet  ?Reviewed medications with patient and discussed importance of compliance . 04-10-2022: The patient is compliant with medications.   ?Advised patient, providing education and rationale, to monitor blood pressure daily and record, calling PCP for findings outside established parameters. 04-10-2022: The patient has a good understanding of keeping blood pressures under control.   ?Discussed complications of poorly controlled blood pressure such as heart disease, stroke, circulatory complications, vision complications, kidney impairment, sexual dysfunction    ?Advised patient to discuss changes in kidney function, new concerns with kidney function, questions or concerns with provider    ?Discussed plans  with patient for ongoing care management follow up and provided patient with direct contact information for care management team    ?Screening for signs and symptoms of depression related to chronic disease state      ?Discussed the impact of chronic kidney disease on daily life and mental health and acknowledged and normalized feelings of disempowerment, fear, and frustration    ?Assessed social determinant of health barriers    ?Provided education on kidney disease progression    ? ? ?History of Seizures  (Status: Goal on Track (progressing): YES.) Long Term Goal  ?Evaluation of  current treatment plan related to  Seizures ,  self-management and patient's adherence to plan as established by provider. 04-10-2022: The patient denies any issues with new seizure activity. Is doing well. Knows what to look f

## 2022-04-17 ENCOUNTER — Encounter: Payer: Self-pay | Admitting: Nurse Practitioner

## 2022-04-17 MED ORDER — LOSARTAN POTASSIUM 100 MG PO TABS: 100.0000 mg | ORAL_TABLET | Freq: Every day | ORAL | 4 refills | Status: DC

## 2022-04-17 MED ORDER — AMLODIPINE BESYLATE 2.5 MG PO TABS: 2.5000 mg | ORAL_TABLET | Freq: Every day | ORAL | 4 refills | Status: DC

## 2022-04-19 DIAGNOSIS — N183 Chronic kidney disease, stage 3 unspecified: Secondary | ICD-10-CM

## 2022-04-19 DIAGNOSIS — E785 Hyperlipidemia, unspecified: Secondary | ICD-10-CM | POA: Diagnosis not present

## 2022-04-19 DIAGNOSIS — I129 Hypertensive chronic kidney disease with stage 1 through stage 4 chronic kidney disease, or unspecified chronic kidney disease: Secondary | ICD-10-CM | POA: Diagnosis not present

## 2022-04-20 ENCOUNTER — Telehealth: Payer: Medicare HMO

## 2022-04-22 ENCOUNTER — Telehealth: Payer: Self-pay | Admitting: Licensed Clinical Social Worker

## 2022-04-22 NOTE — Telephone Encounter (Signed)
? ?   Clinical Social Work  ?Care Management  ? Phone Outreach  ? ? ?04/22/2022 ?Name: Kelly Poole MRN: [id number] DOB: 25-Mar-1951 ? ?Kelly Poole is a 71 y.o. year old female who is a primary care patient of Cannady, Henrine Screws T, NP .  ? ?Reason for referral: Mental Health Counseling and Resources.   ? ?2nd unsuccessful telephone outreach attempt.  If unable to reach patient by phone on the 3rd attempt, will discontinue outreach calls but will be available at any time to provide services.  ? ?Plan:CCM LCSW will wait for return call. If no return call is received, Will route chart to Care Guide to see if patient would like to reschedule phone appointment  ? ?Review of patient status, including review of consultants reports, relevant laboratory and other test results, and collaboration with appropriate care team members and the patient's provider was performed as part of comprehensive patient evaluation and provision of care management services.   ? ? ?Christa See, MSW, LCSW ?[facility] ?[facility] ?[email or web address] ?Phone [phone] ?8:11 AM ? ? ?  ? ? ? ? ?

## 2022-04-28 ENCOUNTER — Telehealth: Payer: Self-pay | Admitting: Oncology

## 2022-04-28 NOTE — Telephone Encounter (Signed)
Patient called and stated her primary care physician, Cannady, has suggested she come back to see Dr. Grayland Ormond. Please advise.  ?Thank you   ?

## 2022-04-30 ENCOUNTER — Other Ambulatory Visit: Payer: Self-pay | Admitting: Nurse Practitioner

## 2022-05-01 NOTE — Telephone Encounter (Signed)
Dose changed 09/10/21. ?

## 2022-05-11 ENCOUNTER — Ambulatory Visit (INDEPENDENT_AMBULATORY_CARE_PROVIDER_SITE_OTHER): Payer: Medicare HMO | Admitting: Licensed Clinical Social Worker

## 2022-05-11 DIAGNOSIS — I10 Essential (primary) hypertension: Secondary | ICD-10-CM

## 2022-05-11 DIAGNOSIS — N1832 Chronic kidney disease, stage 3b: Secondary | ICD-10-CM

## 2022-05-11 DIAGNOSIS — H25813 Combined forms of age-related cataract, bilateral: Secondary | ICD-10-CM | POA: Diagnosis not present

## 2022-05-11 DIAGNOSIS — F5101 Primary insomnia: Secondary | ICD-10-CM

## 2022-05-11 DIAGNOSIS — H52213 Irregular astigmatism, bilateral: Secondary | ICD-10-CM | POA: Diagnosis not present

## 2022-05-11 DIAGNOSIS — Z0100 Encounter for examination of eyes and vision without abnormal findings: Secondary | ICD-10-CM | POA: Diagnosis not present

## 2022-05-13 NOTE — Patient Instructions (Signed)
Visit Information  Thank you for taking time to visit with me today. Please don't hesitate to contact me if I can be of assistance to you before our next scheduled telephone appointment.  Following are the goals we discussed today:  Patient Self Activities/Goals: Continue utilizing healthy coping skills discussed Follow up with PCP regarding request for assistance with hair loss and scheduled appointments Practice positive self-talk to promote positive mood   If you are experiencing a Mental Health or [place] or need someone to talk to, please call the Suicide and Crisis Lifeline: 988 call 911   Patient verbalizes understanding of instructions and care plan provided today and agrees to view in [place]. Active MyChart status and patient understanding of how to access instructions and care plan via MyChart confirmed with patient.     No further follow up required: All care plan goals have been completed  Christa See, MSW, [email or web address] Phone [phone] 8:13 AM

## 2022-05-13 NOTE — Chronic Care Management (AMB) (Signed)
Chronic Care Management    Clinical Social Work Note  05/13/2022 Name: FLORITA NITSCH MRN: [id number] DOB: 08/01/1951  Munachimso NADYNE GARIEPY is a 71 y.o. year old female who is a primary care patient of Cannady, Barbaraann Faster, NP. The CCM team was consulted to assist the patient with chronic disease management and/or care coordination needs related to: Mental Health Counseling and Resources.   Engaged with patient by telephone for follow up visit in response to provider referral for social work chronic care management and care coordination services.   Consent to Services:  The patient was given information about Chronic Care Management services, agreed to services, and gave verbal consent prior to initiation of services.  Please see initial visit note for detailed documentation.   Patient agreed to services and consent obtained.   Assessment: Review of patient past medical history, allergies, medications, and health status, including review of relevant consultants reports was performed today as part of a comprehensive evaluation and provision of chronic care management and care coordination services.     SDOH (Social Determinants of Health) assessments and interventions performed:    Advanced Directives Status: Not addressed in this encounter.  CCM Care Plan  Allergies  Allergen Reactions   Macrobid [Nitrofurantoin Macrocrystal] Other (See Comments)    Sore throat and irritation to throat    Outpatient Encounter Medications as of 05/11/2022  Medication Sig Note   amLODipine (NORVASC) 2.5 MG tablet Take 1 tablet (2.5 mg total) by mouth daily.    Ascorbic Acid (VITAMIN C) 1000 MG tablet Take 1,000 mg by mouth daily.    aspirin 81 MG tablet Take 81 mg by mouth daily.    atorvastatin (LIPITOR) 40 MG tablet Take 1 tablet (40 mg total) by mouth daily.    Cholecalciferol 50 MCG (2000 UT) TABS Take 1 tablet by mouth daily.    FEROSUL 325 (65 Fe) MG tablet TAKE 1 TABLET EVERY DAY WITH BREAKFAST     Garlic 7106 MG CAPS Take 1,000 mg by mouth daily. 10/12/2019: Doesn't take regular   lamoTRIgine (LAMICTAL) 25 MG tablet Take 2 tablets (50 mg total) by mouth 2 (two) times daily 03/23/2022: Lamictal 25 mg once daily at night now    losartan (COZAAR) 100 MG tablet Take 1 tablet (100 mg total) by mouth daily.    Multiple Vitamin (MULTIVITAMINS PO) Take by mouth daily.    omeprazole (PRILOSEC) 20 MG capsule Take 1 capsule (20 mg total) by mouth daily.    vitamin E 100 UNIT capsule Take 100 Units by mouth daily.    No facility-administered encounter medications on file as of 05/11/2022.    Patient Active Problem List   Diagnosis Date Noted   Osteopenia of lumbar spine 01/01/2022   Persistent proteinuria 09/08/2021   Secondary hyperparathyroidism ([place]) 12/23/2020   Seizure ([place]) 12/23/2020   Anemia 07/30/2020   Osteoarthritis of right knee 09/07/2019   Vasomotor symptoms due to menopause 03/24/2019   CKD (chronic kidney disease) stage 3, GFR 30-59 ml/min ([place]) 10/05/2016   Vitamin B12 deficiency (dietary) anemia 12/27/2015   Hypertension 12/27/2015   Insomnia 06/03/2015   Thyroid goiter 06/03/2015   HX: breast cancer 06/03/2015   Gastroesophageal reflux disease 06/03/2015   Hyperlipidemia 06/03/2015    Conditions to be addressed/monitored: HTN, CKD Stage 3, and Osteoarthritis  Care Plan : General Social Work (Adult)  Updates made by Rebekah Chesterfield, LCSW since 05/13/2022 12:00 AM     Problem: Quality of Life (General Plan of Care)  Long-Range Goal: Quality of Life Maintained Completed 05/11/2022  Start Date: 02/10/2021  This Visit's Progress: On track  Recent Progress: On track  Priority: Medium  Note:   Current barriers:   Patient is having depressive symptoms due to losing independence as she is unable to drive right now  Unable to consistently perform activities of daily living and needs additional assistance / support in order to meet this unmet need Unable to   independently self manage needs related to chronic health conditions.  Knowledge deficits related to short term plan for care coordination needs and long term plans for chronic disease management needs Clinical Goals: Over the next 120 days, patient will work with SW to address concerns related to stress management, increasing self care and coping with being unable to drive at this time Interventions : Assessed needs, level of care concerns, and barriers to care Pt reports management of BP with compliance with medications Neurologist will address pt's concerns that seizure medication causes hair loss Reports no knee pain and obtained new glasses Strategies to assist in promoting healthy boundaries were discussed to promote stress management CCM LCSW reviewed upcoming appointments with patient.  Patient denies need for any additional resources at this time Discussed plans with patient for ongoing care management follow up and provided patient with direct contact information for care management team Collaboration with PCP regarding development and update of comprehensive plan of care as evidenced by provider attestation and co-signature Inter-disciplinary care team collaboration (see longitudinal plan of care) Patient Self Activities/Goals: Continue utilizing healthy coping skills discussed Follow up with PCP regarding request for assistance with hair loss and scheduled appointments Practice positive self-talk to promote positive mood       Follow Up Plan:  No follow up required      Christa See, [email or web address] Phone [phone] 8:11 AM

## 2022-05-20 ENCOUNTER — Telehealth: Payer: Self-pay | Admitting: Oncology

## 2022-05-20 DIAGNOSIS — I10 Essential (primary) hypertension: Secondary | ICD-10-CM | POA: Diagnosis not present

## 2022-05-20 DIAGNOSIS — N1831 Chronic kidney disease, stage 3a: Secondary | ICD-10-CM | POA: Diagnosis not present

## 2022-05-20 DIAGNOSIS — D631 Anemia in chronic kidney disease: Secondary | ICD-10-CM | POA: Diagnosis not present

## 2022-05-20 DIAGNOSIS — M8580 Other specified disorders of bone density and structure, unspecified site: Secondary | ICD-10-CM

## 2022-05-20 DIAGNOSIS — N183 Chronic kidney disease, stage 3 unspecified: Secondary | ICD-10-CM

## 2022-05-20 DIAGNOSIS — R7989 Other specified abnormal findings of blood chemistry: Secondary | ICD-10-CM

## 2022-05-20 DIAGNOSIS — N2581 Secondary hyperparathyroidism of renal origin: Secondary | ICD-10-CM | POA: Diagnosis not present

## 2022-05-20 NOTE — Telephone Encounter (Signed)
pt called in stating that she has ben waiting for somene to call her and to schedule appts. Would like to know when she;s coming back...KJ

## 2022-05-20 NOTE — Telephone Encounter (Signed)
A phone note was made on 5/9 stating ...  "Patient called and stated her primary care physician, Cannady, has suggested she come back to see Dr. Grayland Ormond. Please advise."  It was never forwarded to anyone. Dr. Grayland Ormond please advise. If you want labs and appointment please advise on what lab orders are needed. Thanks!

## 2022-05-21 ENCOUNTER — Other Ambulatory Visit: Payer: Self-pay

## 2022-05-21 DIAGNOSIS — N1831 Chronic kidney disease, stage 3a: Secondary | ICD-10-CM | POA: Diagnosis not present

## 2022-05-21 DIAGNOSIS — R7989 Other specified abnormal findings of blood chemistry: Secondary | ICD-10-CM

## 2022-05-21 DIAGNOSIS — R0683 Snoring: Secondary | ICD-10-CM | POA: Diagnosis not present

## 2022-05-21 DIAGNOSIS — R9082 White matter disease, unspecified: Secondary | ICD-10-CM | POA: Diagnosis not present

## 2022-05-21 DIAGNOSIS — I10 Essential (primary) hypertension: Secondary | ICD-10-CM | POA: Diagnosis not present

## 2022-05-21 DIAGNOSIS — D649 Anemia, unspecified: Secondary | ICD-10-CM | POA: Diagnosis not present

## 2022-05-21 DIAGNOSIS — R569 Unspecified convulsions: Secondary | ICD-10-CM | POA: Diagnosis not present

## 2022-05-22 ENCOUNTER — Other Ambulatory Visit: Payer: Self-pay | Admitting: Neurology

## 2022-05-22 DIAGNOSIS — R569 Unspecified convulsions: Secondary | ICD-10-CM

## 2022-05-22 NOTE — Telephone Encounter (Addendum)
Labs ordered and my chart message sent letting patient know.

## 2022-05-22 NOTE — Addendum Note (Signed)
Addended by: Delice Bison E on: 05/22/2022 02:36 PM   Modules accepted: Orders

## 2022-05-26 ENCOUNTER — Inpatient Hospital Stay: Payer: Medicare HMO | Attending: Oncology

## 2022-05-26 DIAGNOSIS — R7989 Other specified abnormal findings of blood chemistry: Secondary | ICD-10-CM

## 2022-05-26 DIAGNOSIS — D649 Anemia, unspecified: Secondary | ICD-10-CM | POA: Diagnosis not present

## 2022-05-26 DIAGNOSIS — N289 Disorder of kidney and ureter, unspecified: Secondary | ICD-10-CM | POA: Insufficient documentation

## 2022-05-26 LAB — CBC WITH DIFFERENTIAL/PLATELET
Abs Immature Granulocytes: 0.02 10*3/uL (ref 0.00–0.07)
Basophils Absolute: 0 10*3/uL (ref 0.0–0.1)
Basophils Relative: 1 %
Eosinophils Absolute: 0.3 10*3/uL (ref 0.0–0.5)
Eosinophils Relative: 4 %
HCT: 32.4 % — ABNORMAL LOW (ref 36.0–46.0)
Hemoglobin: 10.6 g/dL — ABNORMAL LOW (ref 12.0–15.0)
Immature Granulocytes: 0 %
Lymphocytes Relative: 36 %
Lymphs Abs: 2.4 10*3/uL (ref 0.7–4.0)
MCH: 30.7 pg (ref 26.0–34.0)
MCHC: 32.7 g/dL (ref 30.0–36.0)
MCV: 93.9 fL (ref 80.0–100.0)
Monocytes Absolute: 0.7 10*3/uL (ref 0.1–1.0)
Monocytes Relative: 11 %
Neutro Abs: 3.2 10*3/uL (ref 1.7–7.7)
Neutrophils Relative %: 48 %
Platelets: 267 10*3/uL (ref 150–400)
RBC: 3.45 MIL/uL — ABNORMAL LOW (ref 3.87–5.11)
RDW: 12.5 % (ref 11.5–15.5)
WBC: 6.7 10*3/uL (ref 4.0–10.5)
nRBC: 0 % (ref 0.0–0.2)

## 2022-05-26 LAB — IRON AND TIBC
Iron: 80 ug/dL (ref 28–170)
Saturation Ratios: 32 % — ABNORMAL HIGH (ref 10.4–31.8)
TIBC: 251 ug/dL (ref 250–450)
UIBC: 171 ug/dL

## 2022-05-26 LAB — FERRITIN: Ferritin: 256 ng/mL (ref 11–307)

## 2022-05-28 LAB — HGB FRACTIONATION CASCADE
Hgb A2: 2.4 % (ref 1.8–3.2)
Hgb A: 97.6 % (ref 96.4–98.8)
Hgb F: 0 % (ref 0.0–2.0)
Hgb S: 0 %

## 2022-06-01 LAB — HEMOCHROMATOSIS DNA-PCR(C282Y,H63D)

## 2022-06-02 ENCOUNTER — Encounter: Payer: Self-pay | Admitting: Oncology

## 2022-06-02 ENCOUNTER — Inpatient Hospital Stay (HOSPITAL_BASED_OUTPATIENT_CLINIC_OR_DEPARTMENT_OTHER): Payer: Medicare HMO | Admitting: Oncology

## 2022-06-02 ENCOUNTER — Other Ambulatory Visit: Payer: Medicare HMO

## 2022-06-02 VITALS — BP 124/86 | HR 77 | Temp 97.1°F | Resp 16 | Ht <= 58 in | Wt 150.0 lb

## 2022-06-02 DIAGNOSIS — N289 Disorder of kidney and ureter, unspecified: Secondary | ICD-10-CM | POA: Diagnosis not present

## 2022-06-02 DIAGNOSIS — R7989 Other specified abnormal findings of blood chemistry: Secondary | ICD-10-CM | POA: Diagnosis not present

## 2022-06-02 DIAGNOSIS — D649 Anemia, unspecified: Secondary | ICD-10-CM | POA: Diagnosis not present

## 2022-06-02 NOTE — Progress Notes (Signed)
[place]  Telephone:(336) [phone] Fax:(336) [phone]  ID: Kelly Poole OB: 20-Apr-1951  MR#: [id number]  FXT#:[id number]  Patient Care Team: Venita Lick, NP as PCP - General (Nurse Practitioner) Vanita Ingles, RN as Case Manager ([place]) Madelin Rear, [facility] as Pharmacist (General Practice) Lloyd Huger, MD as Consulting Physician (Oncology)  CHIEF COMPLAINT: Mild chronic anemia  INTERVAL HISTORY: Patient was last evaluated in clinic nearly 2 years ago.  She returns to clinic today for further evaluation and discussion of her laboratory results.  She continues to feel well and remains asymptomatic.  She does not complain of any weakness or fatigue. She has no neurologic complaints.  She denies any recent fevers or illnesses.  She has a good appetite and denies weight loss.  She has no chest pain, shortness of breath, cough, or hemoptysis.  She denies any nausea, vomiting, constipation, or diarrhea.  She has no urinary complaints.  Patient feels at her baseline offers no specific complaints today.  REVIEW OF SYSTEMS:   Review of Systems  Constitutional: Negative.  Negative for fever, malaise/fatigue and weight loss.  Respiratory: Negative.  Negative for cough.   Cardiovascular: Negative.  Negative for chest pain and leg swelling.  Gastrointestinal: Negative.  Negative for abdominal pain, blood in stool and melena.  Genitourinary: Negative.  Negative for dysuria.  Musculoskeletal: Negative.  Negative for back pain.  Skin: Negative.  Negative for rash.  Neurological: Negative.  Negative for dizziness, focal weakness, weakness and headaches.  Psychiatric/Behavioral: Negative.  The patient is not nervous/anxious.     As per HPI. Otherwise, a complete review of systems is negative.  PAST MEDICAL HISTORY: Past Medical History:  Diagnosis Date   Breast cancer ([place]) 1991   left breast mastectomy. No chemo or rad tx   GERD (gastroesophageal  reflux disease)    Heart murmur    High cholesterol    Hypertension    Seizures ([place])     PAST SURGICAL HISTORY: Past Surgical History:  Procedure Laterality Date   ABDOMINAL HYSTERECTOMY  1978   MASTECTOMY Left 1991   Due to breast cancer   TUBAL LIGATION      FAMILY HISTORY: Family History  Problem Relation Age of Onset   Stroke Mother    Thyroid disease Mother    Cancer Father    Diabetes Maternal Aunt    Breast cancer Other     ADVANCED DIRECTIVES (Y/N):  N  HEALTH MAINTENANCE: Social History   Tobacco Use   Smoking status: Never   Smokeless tobacco: Never  Vaping Use   Vaping Use: Never used  Substance Use Topics   Alcohol use: No    Alcohol/week: 0.0 standard drinks of alcohol   Drug use: No     Colonoscopy:  PAP:  Bone density:  Lipid panel:  Allergies  Allergen Reactions   Macrobid [Nitrofurantoin Macrocrystal] Other (See Comments)    Sore throat and irritation to throat    Current Outpatient Medications  Medication Sig Dispense Refill   amLODipine (NORVASC) 2.5 MG tablet Take 1 tablet (2.5 mg total) by mouth daily. 90 tablet 4   Ascorbic Acid (VITAMIN C) 1000 MG tablet Take 1,000 mg by mouth daily.     aspirin 81 MG tablet Take 81 mg by mouth daily.     atorvastatin (LIPITOR) 40 MG tablet Take 1 tablet (40 mg total) by mouth daily. 90 tablet 4   Cholecalciferol 50 MCG (2000 UT) TABS Take 1 tablet by mouth  daily.     FEROSUL 325 (65 Fe) MG tablet TAKE 1 TABLET EVERY DAY WITH BREAKFAST 90 tablet 4   Garlic 1601 MG CAPS Take 1,000 mg by mouth daily.     lamoTRIgine (LAMICTAL) 25 MG tablet Take 2 tablets (50 mg total) by mouth 2 (two) times daily     losartan (COZAAR) 100 MG tablet Take 1 tablet (100 mg total) by mouth daily. 90 tablet 4   Multiple Vitamin (MULTIVITAMINS PO) Take by mouth daily.     omeprazole (PRILOSEC) 20 MG capsule Take 1 capsule (20 mg total) by mouth daily. 90 capsule 4   vitamin E 100 UNIT capsule Take 100 Units by mouth  daily.     No current facility-administered medications for this visit.    OBJECTIVE: Vitals:   06/02/22 0926  BP: 124/86  Pulse: 77  Resp: 16  Temp: (!) 97.1 F (36.2 C)  SpO2: 98%     Body mass index is 33.63 kg/m.    ECOG FS:0 - Asymptomatic  General: Well-developed, well-nourished, no acute distress. Eyes: Pink conjunctiva, anicteric sclera. HEENT: Normocephalic, moist mucous membranes. Lungs: No audible wheezing or coughing. Heart: Regular rate and rhythm. Abdomen: Soft, nontender, no obvious distention. Musculoskeletal: No edema, cyanosis, or clubbing. Neuro: Alert, answering all questions appropriately. Cranial nerves grossly intact. Skin: No rashes or petechiae noted. Psych: Normal affect.   LAB RESULTS:  Lab Results  Component Value Date   NA 141 03/25/2022   K 4.4 03/25/2022   CL 103 03/25/2022   CO2 23 03/25/2022   GLUCOSE 96 03/25/2022   BUN 15 03/25/2022   CREATININE 1.21 (H) 03/25/2022   CALCIUM 9.6 03/25/2022   PROT 7.5 03/25/2022   ALBUMIN 4.8 03/25/2022   AST 30 03/25/2022   ALT 30 03/25/2022   ALKPHOS 122 (H) 03/25/2022   BILITOT 0.3 03/25/2022   GFRNONAA 42 (L) 12/26/2020   GFRAA 45 (L) 07/30/2020    Lab Results  Component Value Date   WBC 6.7 05/26/2022   NEUTROABS 3.2 05/26/2022   HGB 10.6 (L) 05/26/2022   HCT 32.4 (L) 05/26/2022   MCV 93.9 05/26/2022   PLT 267 05/26/2022   Lab Results  Component Value Date   IRON 80 05/26/2022   TIBC 251 05/26/2022   IRONPCTSAT 32 (H) 05/26/2022   Lab Results  Component Value Date   FERRITIN 256 05/26/2022     STUDIES: No results found.  ASSESSMENT: Mild chronic anemia.  PLAN:    Mild chronic anemia: Chronic and unchanged.  Patient's most recent hemoglobin was reported at 10.6.  Upon review of the chart, patient's hemoglobin has been mildly decreased since at least June 2016 where her reported value was 10.4.  All of her other laboratory work is either negative or within normal  limits including hemoglobin electrophoresis.  No intervention is needed at this time.  No further follow-up has been scheduled.  Please refer patient back if there are any questions or concerns. Elevated iron saturation ratio: Mild.  Hemochromatosis mutation is negative.  No intervention needed.  Likely clinically insignificant. History of B12 deficiency: Resolved. Renal insufficiency: Mild, monitor.  Patient most recent creatinine is 1.21.   Patient expressed understanding and was in agreement with this plan. She also understands that She can call clinic at any time with any questions, concerns, or complaints.    Lloyd Huger, MD   06/02/2022 10:13 AM

## 2022-06-04 ENCOUNTER — Ambulatory Visit
Admission: RE | Admit: 2022-06-04 | Discharge: 2022-06-04 | Disposition: A | Discharge status: Home / Self Care | Payer: Medicare HMO | Source: Ambulatory Visit | Attending: Neurology | Admitting: Neurology

## 2022-06-04 DIAGNOSIS — R569 Unspecified convulsions: Secondary | ICD-10-CM

## 2022-06-04 DIAGNOSIS — Z853 Personal history of malignant neoplasm of breast: Secondary | ICD-10-CM | POA: Diagnosis not present

## 2022-06-04 DIAGNOSIS — R930 Abnormal findings on diagnostic imaging of skull and head, not elsewhere classified: Secondary | ICD-10-CM | POA: Diagnosis not present

## 2022-06-04 IMAGING — MR MR HEAD WO/W CM
16 series · 48 of 48 positions shown · IV contrast (15 mLs Multihance)
Comparison: Brain MRI [DATE] and earlier.

CLINICAL DATA: 70-year-old female status post seizure last year.
History of breast cancer, and abnormal anterior right frontal lobe
leptomeningeal enhancement [DATE].

EXAM:
MRI HEAD WITHOUT AND WITH CONTRAST
TECHNIQUE: Multiplanar, multiecho pulse sequences of the brain and surrounding
structures were obtained without and with intravenous contrast.
CONTRAST:  15mL MULTIHANCE GADOBENATE DIMEGLUMINE 529 MG/ML IV SOLN

[Series 5: T1 · sagittal · 4.0mm · 0.75mm/px · 2 of 31 slices shown (1 of 4)]
[im 1/31]
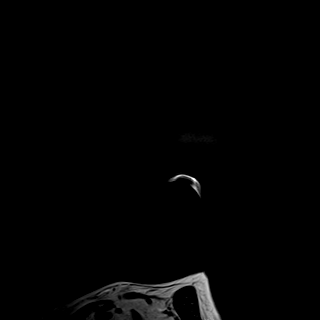
[im 31/31]
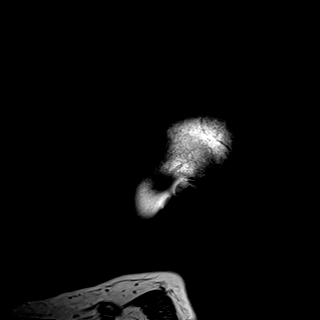

[Series 6: DWI · axial · 3.0mm · 0.94mm/px · z∈[-54,+86]mm · 8 of 168 slices shown (1 of 3)]
[im 1/168]
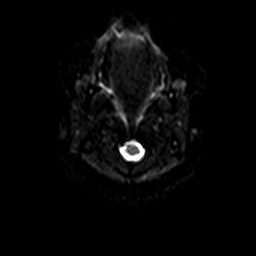
[im 24/168]
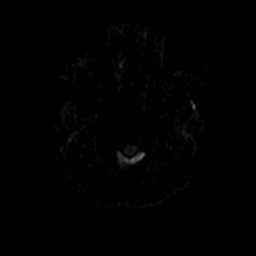
[im 48/168]
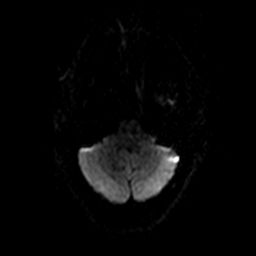
[im 72/168]
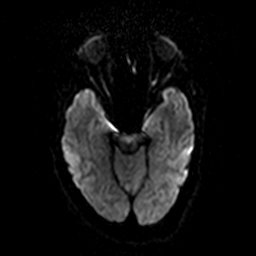
[im 96/168]
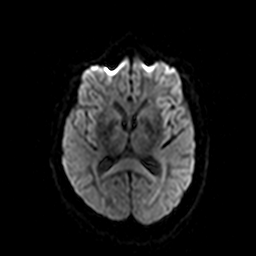
[im 120/168]
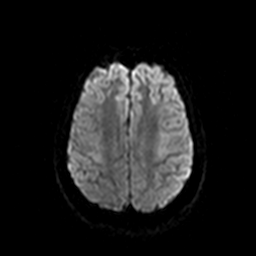
[im 144/168]
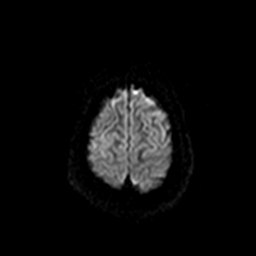
[im 168/168]
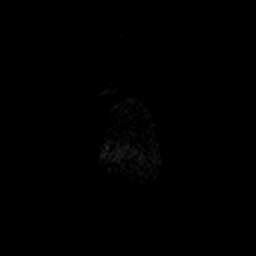

[Series 7: ax dwi_tracew · axial · 3.0mm · 0.94mm/px · z∈[-54,+86]mm · 5 of 84 slices shown]
[im 1/84]
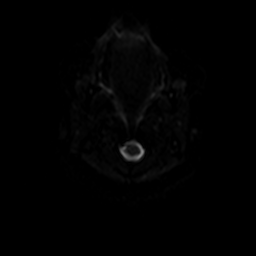
[im 21/84]
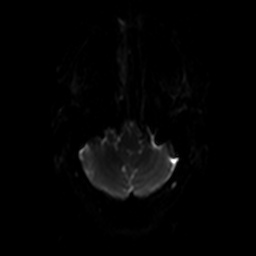
[im 42/84]
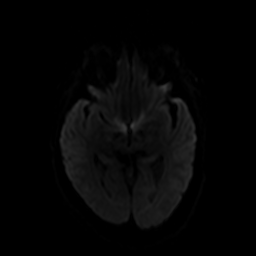
[im 63/84]
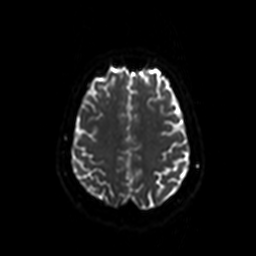
[im 84/84]
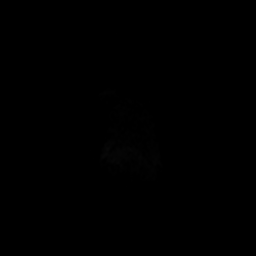

[Series 8: ax dwi_adc · axial · 3.0mm · 0.94mm/px · z∈[-54,+86]mm · 2 of 42 slices shown]
[im 1/42]
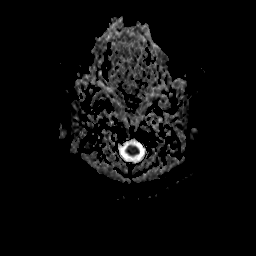
[im 42/42]
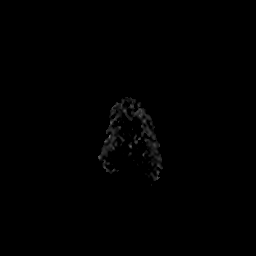

[Series 9: DWI · coronal · 5.0mm · 1.44mm/px · 3 of 64 slices shown (2 of 3)]
[im 1/64]
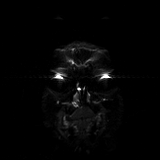
[im 32/64]
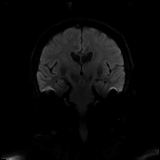
[im 64/64]
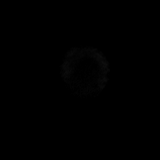

[Series 10: DWI · coronal · 5.0mm · 1.44mm/px · 1 of 32 slices shown (3 of 3)]
[im 1/32]
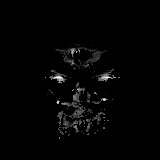

[Series 11: T2 · axial · 4.0mm · 0.36mm/px · 1 of 30 slices shown (1 of 2)]
[im 1/30]
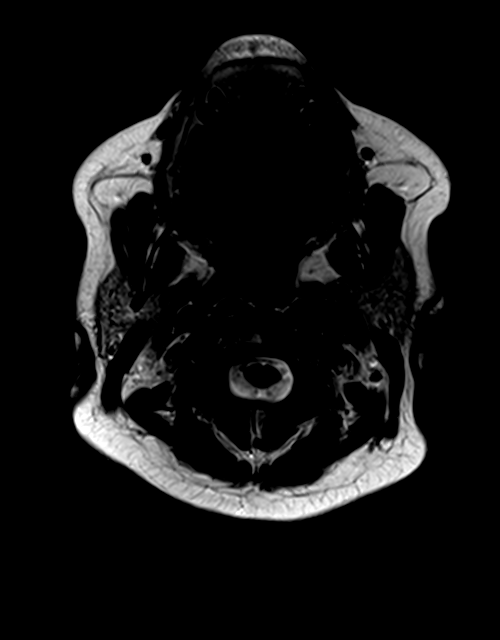

[Series 12: FLAIR · axial · 3.0mm · 0.72mm/px · 1 of 26 slices shown (1 of 2)]
[im 1/26]
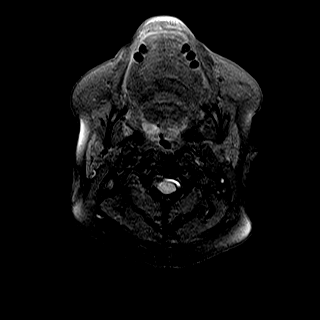

[Series 14: swi_images · axial · 3.0mm · 0.90mm/px · z∈[-79,+98]mm · 3 of 64 slices shown]
[im 1/64]
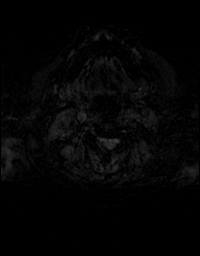
[im 32/64]
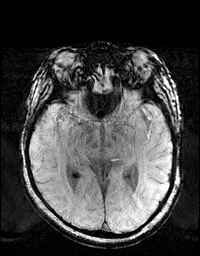
[im 64/64]
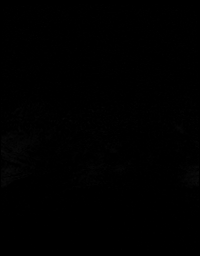

[Series 15: T1 · axial · 1.0mm · 0.90mm/px · z∈[-75,+70]mm · 7 of 160 slices shown (2 of 4)]
[im 1/160]
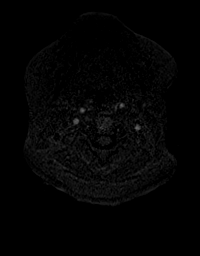
[im 27/160]
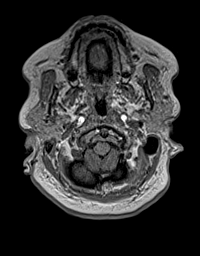
[im 54/160]
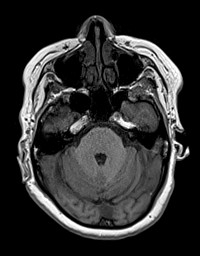
[im 80/160]
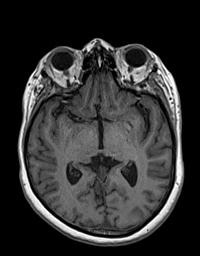
[im 107/160]
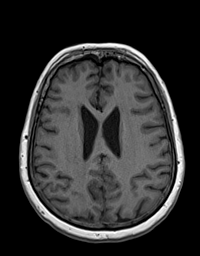
[im 133/160]
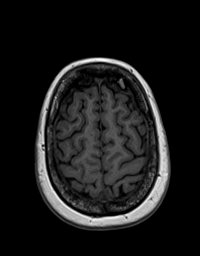
[im 160/160]
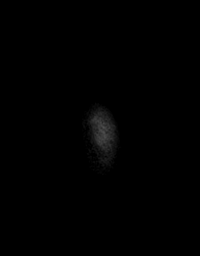

[Series 16: T2 · coronal · 3.0mm · 0.47mm/px · 1 of 26 slices shown (2 of 2)]
[im 1/26]
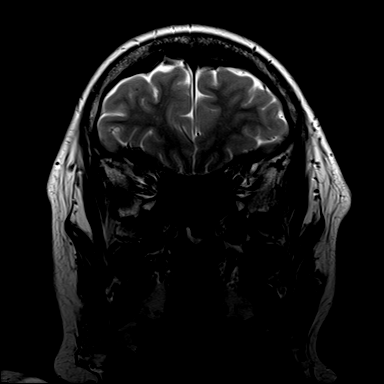

[Series 17: FLAIR · coronal · 3.0mm · 0.56mm/px · 1 of 26 slices shown (2 of 2)]
[im 1/26]
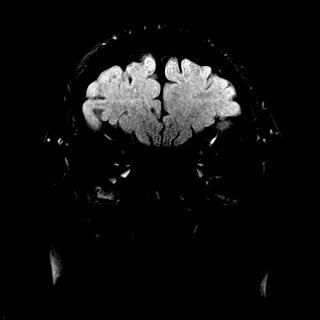

[Series 18: T2 post-contrast · coronal · 4.0mm · 0.36mm/px · 1 of 33 slices shown]
[im 1/33]
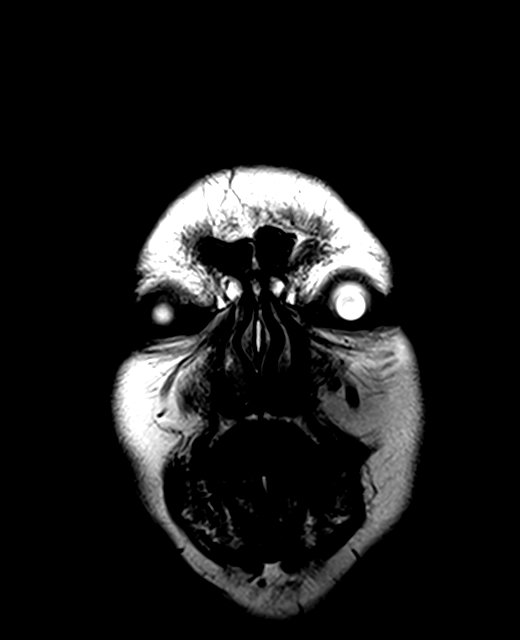

[Series 19: T1 · axial · 1.0mm · 0.90mm/px · z∈[-75,+70]mm · 7 of 160 slices shown (3 of 4)]
[im 1/160]
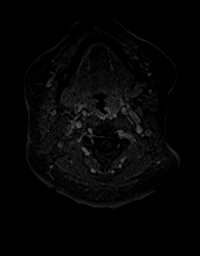
[im 27/160]
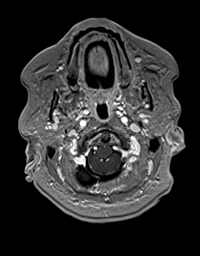
[im 54/160]
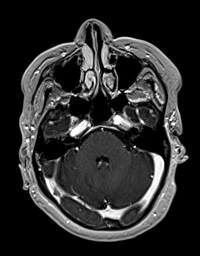
[im 80/160]
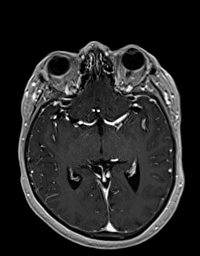
[im 107/160]
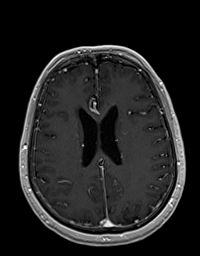
[im 133/160]
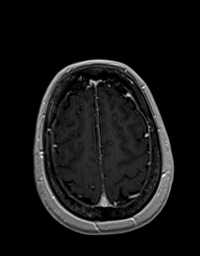
[im 160/160]
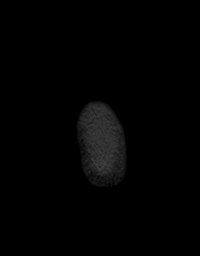

[Series 20: T1 post-contrast · coronal · 4.0mm · 0.72mm/px · 1 of 33 slices shown]
[im 1/33]
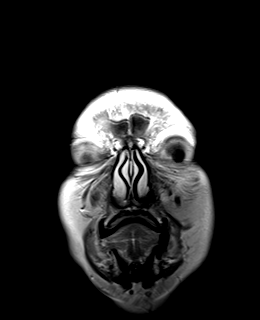

[Series 100: T1 · sagittal · 1.0mm · 0.48mm/px · 4 of 99 slices shown (4 of 4)]
[im 1/99]
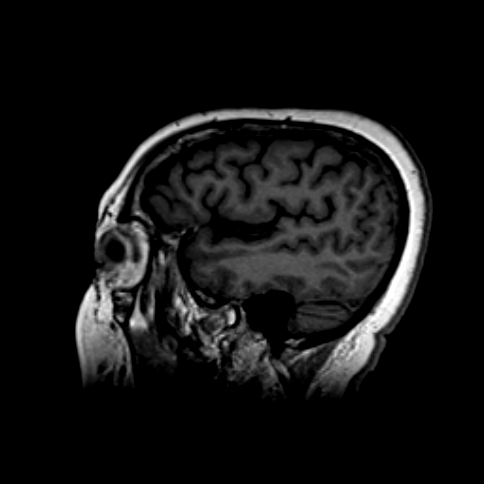
[im 33/99]
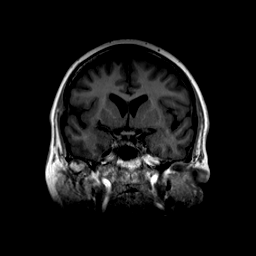
[im 66/99]
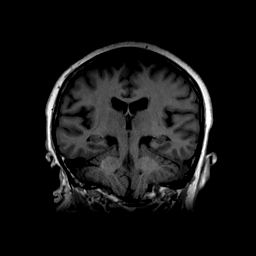
[im 99/99]
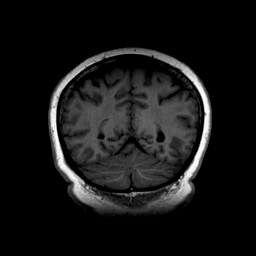

[48 of 48 positions shown; findings below may reference images not displayed]

FINDINGS: Brain: Cerebral volume remains normal for age. No restricted
diffusion to suggest acute infarction. No midline shift, mass
effect, evidence of mass lesion, ventriculomegaly, extra-axial
collection or acute intracranial hemorrhage. Cervicomedullary
junction and pituitary are within normal limits.

No abnormal intracranial enhancement identified now. No dural or
leptomeningeal thickening. Abnormal right anterior frontal gyrus
signal has largely resolved since last year with no convincing
cortical encephalomalacia. Subtle residual subcortical T2
hyperintensity series 11, image 20. Scattered small foci of
susceptibility on SWI in both cerebral hemispheres appear increased
since [DATE], but stable since [DATE] and do not rise to the
level typical of amyloid angiopathy. No superficial siderosis
identified.

Deep gray matter nuclei, brainstem and cerebellum remain normal for
age. On thin slice coronal images the hippocampal formations appear
fairly symmetric and within normal limits (series 16, image 11).

Vascular: Major intracranial vascular flow voids appear stable, with
diminutive vertebrobasilar system and evidence of fetal type
bilateral PCA origins. Following contrast the major dural venous
sinuses are enhancing and appear to be patent.

Skull and upper cervical spine: Negative visible cervical spine.
Visualized bone marrow signal is within normal limits.

Sinuses/Orbits: Stable, negative.

Other: Grossly normal visible internal auditory structures. Negative
visible scalp and face.
IMPRESSION: 1. No metastatic disease or acute intracranial abnormality
identified.

2. Virtually resolved abnormal anterior right frontal lobe process
in [DATE], minimal residual subcortical signal abnormality
there.

3. A small number of clustered foci of susceptibility in the brain
are stable from [REDACTED], but were increased since [REDACTED].
These are nonspecific, could be chronic microhemorrhages but the
extent is far less than typical of amyloid angiopathy.

4. No new intracranial abnormality.

## 2022-06-04 MED ORDER — GADOBENATE DIMEGLUMINE 529 MG/ML IV SOLN
15.0000 mL | Freq: Once | INTRAVENOUS | Status: AC | PRN
  Administered 2022-06-04: 15 mL via INTRAVENOUS

## 2022-06-12 ENCOUNTER — Telehealth: Payer: Medicare HMO

## 2022-06-12 ENCOUNTER — Ambulatory Visit (INDEPENDENT_AMBULATORY_CARE_PROVIDER_SITE_OTHER): Payer: Medicare HMO

## 2022-06-12 DIAGNOSIS — R569 Unspecified convulsions: Secondary | ICD-10-CM

## 2022-06-12 DIAGNOSIS — E7800 Pure hypercholesterolemia, unspecified: Secondary | ICD-10-CM

## 2022-06-12 DIAGNOSIS — N1832 Chronic kidney disease, stage 3b: Secondary | ICD-10-CM

## 2022-06-12 DIAGNOSIS — I10 Essential (primary) hypertension: Secondary | ICD-10-CM

## 2022-06-13 DIAGNOSIS — L089 Local infection of the skin and subcutaneous tissue, unspecified: Secondary | ICD-10-CM | POA: Diagnosis not present

## 2022-06-13 DIAGNOSIS — W57XXXA Bitten or stung by nonvenomous insect and other nonvenomous arthropods, initial encounter: Secondary | ICD-10-CM | POA: Diagnosis not present

## 2022-06-13 DIAGNOSIS — S80861A Insect bite (nonvenomous), right lower leg, initial encounter: Secondary | ICD-10-CM | POA: Diagnosis not present

## 2022-06-13 DIAGNOSIS — L237 Allergic contact dermatitis due to plants, except food: Secondary | ICD-10-CM | POA: Diagnosis not present

## 2022-06-16 ENCOUNTER — Encounter: Payer: Self-pay | Admitting: Unknown Physician Specialty

## 2022-06-16 ENCOUNTER — Ambulatory Visit (INDEPENDENT_AMBULATORY_CARE_PROVIDER_SITE_OTHER): Payer: Medicare HMO | Admitting: Unknown Physician Specialty

## 2022-06-16 VITALS — BP 150/89 | HR 71 | Temp 98.8°F | Ht <= 58 in | Wt 152.0 lb

## 2022-06-16 DIAGNOSIS — W57XXXD Bitten or stung by nonvenomous insect and other nonvenomous arthropods, subsequent encounter: Secondary | ICD-10-CM | POA: Diagnosis not present

## 2022-06-16 DIAGNOSIS — S80861D Insect bite (nonvenomous), right lower leg, subsequent encounter: Secondary | ICD-10-CM | POA: Diagnosis not present

## 2022-06-16 DIAGNOSIS — L237 Allergic contact dermatitis due to plants, except food: Secondary | ICD-10-CM | POA: Diagnosis not present

## 2022-06-16 MED ORDER — BETAMETHASONE DIPROPIONATE 0.05 % EX CREA: TOPICAL_CREAM | Freq: Two times a day (BID) | CUTANEOUS | 1 refills | Status: DC

## 2022-06-16 NOTE — Progress Notes (Signed)
BP (!) 150/89   Pulse 71   Temp 98.8 F (37.1 C) (Oral)   Ht 4' 7.98" (1.422 m)   Wt 152 lb (68.9 kg)   SpO2 98%   BMI 34.10 kg/m    Subjective:    Patient ID: Kelly Poole, female    DOB: [birth year], 71 y.o.   MRN: [id number]  HPI: Kelly Poole is a 71 y.o. female  Chief Complaint  Patient presents with   Rash    For past week rash on left wrist    Insect Bite    On right lower calf, was seen in UC on Sunday was given some ABX and steroid pill to take. Patient states that she is taking but states that it is still itching. Patient has also been using Hydrogen peroxide, calamine lotion and a baking soda paste   Pt has 2 areas of concern today.    Rash on left wrist and right upper arm for 1 week.  This is not getting better or worse.  Itches, no oozing.  Denies fever or SOB  Also rash right lower leg after working in garden.  Thinks she was bitten.  Went to UC and received antibiotics and steroids.  Sounds like she was given Keflex and 3 days of steroid, not sure the amount.    Relevant past medical, surgical, family and social history reviewed and updated as indicated. Interim medical history since our last visit reviewed. Allergies and medications reviewed and updated.  Review of Systems  Per HPI unless specifically indicated above     Objective:    BP (!) 150/89   Pulse 71   Temp 98.8 F (37.1 C) (Oral)   Ht 4' 7.98" (1.422 m)   Wt 152 lb (68.9 kg)   SpO2 98%   BMI 34.10 kg/m   Wt Readings from Last 3 Encounters:  06/16/22 152 lb (68.9 kg)  06/02/22 150 lb (68 kg)  03/25/22 149 lb 9.6 oz (67.9 kg)    Physical Exam Constitutional:      General: She is not in acute distress.    Appearance: Normal appearance. She is well-developed.  HENT:     Head: Normocephalic and atraumatic.  Eyes:     General: Lids are normal. No scleral icterus.       Right eye: No discharge.        Left eye: No discharge.     Conjunctiva/sclera: Conjunctivae normal.   Cardiovascular:     Rate and Rhythm: Normal rate.  Pulmonary:     Effort: Pulmonary effort is normal.  Abdominal:     Palpations: There is no hepatomegaly or splenomegaly.  Musculoskeletal:        General: Normal range of motion.  Skin:    Coloration: Skin is not pale.     Findings: No rash.     Comments: Right lower leg darkened patchy area surrounding a small open area that could be an insect bite.  No erythema or warmth.  Left lower arm and right upper arm erythemetous areas consistent with plant dermatitis  Neurological:     Mental Status: She is alert and oriented to person, place, and time.  Psychiatric:        Behavior: Behavior normal.        Thought Content: Thought content normal.        Judgment: Judgment normal.     Results for orders placed or performed in visit on 05/26/22  Hemochromatosis DNA-PCR(c282y,h63d)  Result Value  Ref Range   DNA Mutation Analysis Comment    Reviewed by: Comment   Hgb Fractionation Cascade  Result Value Ref Range   Hgb F 0.0 0.0 - 2.0 %   Hgb A 97.6 96.4 - 98.8 %   Hgb A2 2.4 1.8 - 3.2 %   Hgb S 0.0 0.0 %   Interpretation, Hgb Fract Comment   Ferritin  Result Value Ref Range   Ferritin 256 11 - 307 ng/mL  Iron and TIBC([facility])  Result Value Ref Range   Iron 80 28 - 170 ug/dL   TIBC 756 433 - 295 ug/dL   Saturation Ratios 32 (H) 10.4 - 31.8 %   UIBC 171 ug/dL  CBC with Differential/Platelet  Result Value Ref Range   WBC 6.7 4.0 - 10.5 K/uL   RBC 3.45 (L) 3.87 - 5.11 MIL/uL   Hemoglobin 10.6 (L) 12.0 - 15.0 g/dL   HCT 18.8 (L) 41.6 - 60.6 %   MCV 93.9 80.0 - 100.0 fL   MCH 30.7 26.0 - 34.0 pg   MCHC 32.7 30.0 - 36.0 g/dL   RDW 30.1 60.1 - 09.3 %   Platelets 267 150 - 400 K/uL   nRBC 0.0 0.0 - 0.2 %   Neutrophils Relative % 48 %   Neutro Abs 3.2 1.7 - 7.7 K/uL   Lymphocytes Relative 36 %   Lymphs Abs 2.4 0.7 - 4.0 K/uL   Monocytes Relative 11 %   Monocytes Absolute 0.7 0.1 - 1.0 K/uL   Eosinophils Relative 4  %   Eosinophils Absolute 0.3 0.0 - 0.5 K/uL   Basophils Relative 1 %   Basophils Absolute 0.0 0.0 - 0.1 K/uL   Immature Granulocytes 0 %   Abs Immature Granulocytes 0.02 0.00 - 0.07 K/uL      Assessment & Plan:   Problem List Items Addressed This Visit   None Visit Diagnoses     Allergic contact dermatitis due to plants, except food    -  Primary   Insect bite of right lower leg, subsequent encounter           Rx for diprolene cream for all areas.  Reassured that there are no signs of infection at this point.  Will continue to monitor  Follow up plan: Return if symptoms worsen or fail to improve.

## 2022-06-19 DIAGNOSIS — N183 Chronic kidney disease, stage 3 unspecified: Secondary | ICD-10-CM

## 2022-06-19 DIAGNOSIS — E785 Hyperlipidemia, unspecified: Secondary | ICD-10-CM

## 2022-06-19 DIAGNOSIS — I129 Hypertensive chronic kidney disease with stage 1 through stage 4 chronic kidney disease, or unspecified chronic kidney disease: Secondary | ICD-10-CM | POA: Diagnosis not present

## 2022-06-24 ENCOUNTER — Ambulatory Visit: Payer: Self-pay | Admitting: *Deleted

## 2022-06-24 NOTE — Telephone Encounter (Signed)
  Chief Complaint: medication advice Symptoms: no energy or boost to get up and do a lot of activites Disposition: '[]'$ ED /'[]'$ Urgent Care (no appt availability in office) / '[]'$ Appointment(In office/virtual)/ '[]'$  [facility]/ '[]'$ Home Care/ '[]'$ Refused Recommended Disposition /'[]'$ [facility] [facility]/ '[x]'$  Follow-up with PCP Additional Notes: pt calling wanting to know if she can take Vit B12 OTC again since it was dc'd last year d/t high b12 levels. Pt said she used to take 5000 or 10056mg to help boost energy. I advised her since her B12 levels were normal in April that [facility] recommends pt possibly taking low dose. Pt would like CB today if possible.   Summary: pt needs over counter med for energy   Pt states that dr took her off B-12 as levels too high. States she needs something as has no energy. Pt said just something over the counter would be fine. FU to advise. [phone]     Reason for Disposition  [1] Caller has NON-URGENT medicine question about med that PCP prescribed AND [2] triager unable to answer question  Answer Assessment - Initial Assessment Questions 1. NAME of MEDICATION: "What medicine are you calling about?"     Vit b12  2. QUESTION: "What is your question?" (e.g., double dose of medicine, side effect)     Having no energy, wanting to take b12 again 3. PRESCRIBING HCP: "Who prescribed it?" Reason: if prescribed by specialist, call should be referred to that group.     Jolene, NP 4. SYMPTOMS: "Do you have any symptoms?"     No energy  Protocols used: Medication Question Call-A-AH

## 2022-06-24 NOTE — Telephone Encounter (Signed)
Summary: pt needs over counter med for energy   Pt states that dr took her off B-12 as levels too high. States she needs something as has no energy. Pt said just something over the counter would be fine. FU to advise. [phone]      No answer, message left.

## 2022-06-24 NOTE — Telephone Encounter (Signed)
Routing to provider to advise.  

## 2022-06-24 NOTE — Telephone Encounter (Signed)
Spoke with patient and made her aware of Jolene's recommendations in regards to B-12. Patient verbalized understanding and has no further questions. Advised patient to give our office a call back if she has any questions.

## 2022-09-03 ENCOUNTER — Telehealth: Payer: Self-pay

## 2022-09-03 NOTE — Progress Notes (Signed)
    Chronic Care Management Pharmacy Assistant   Name: Kelly Poole  MRN: [id number] DOB: [birth year]   Reason for Encounter: Disease State-General    Recent office visits:  06/16/22 Kathrine Haddock, NP (Rash, insect bite) Orders: none; Medication changes: Betamethasone Dipropionate 0.05%  Recent consult visits:  None since the last coordination call on 06/12/22 with [facility] visits:  None since the last coordination call on 06/12/22 with CCM RN   Medications: Outpatient Encounter Medications as of 09/03/2022  Medication Sig Note   amLODipine (NORVASC) 2.5 MG tablet Take 1 tablet (2.5 mg total) by mouth daily.    Ascorbic Acid (VITAMIN C) 1000 MG tablet Take 1,000 mg by mouth daily.    aspirin 81 MG tablet Take 81 mg by mouth daily.    atorvastatin (LIPITOR) 40 MG tablet Take 1 tablet (40 mg total) by mouth daily.    betamethasone dipropionate 0.05 % cream Apply topically 2 (two) times daily.    cephALEXin (KEFLEX) 500 MG capsule Take 500 mg by mouth every 12 (twelve) hours.    Cholecalciferol 50 MCG (2000 UT) TABS Take 1 tablet by mouth daily.    FEROSUL 325 (65 Fe) MG tablet TAKE 1 TABLET EVERY DAY WITH BREAKFAST    Garlic 1572 MG CAPS Take 1,000 mg by mouth daily. 10/12/2019: Doesn't take regular   lamoTRIgine (LAMICTAL) 25 MG tablet Take 2 tablets (50 mg total) by mouth 2 (two) times daily 03/23/2022: Lamictal 25 mg once daily at night now    losartan (COZAAR) 100 MG tablet Take 1 tablet (100 mg total) by mouth daily.    Multiple Vitamin (MULTIVITAMINS PO) Take by mouth daily.    omeprazole (PRILOSEC) 20 MG capsule Take 1 capsule (20 mg total) by mouth daily.    predniSONE (DELTASONE) 10 MG tablet Take 20 mg by mouth daily.    vitamin E 100 UNIT capsule Take 100 Units by mouth daily.    No facility-administered encounter medications on file as of 09/03/2022.   Contacted Joslyn Devon for General Review Call   Chart Review:  Have there been any documented new,  changed, or discontinued medications since last visit? Yes (If yes, include name, dose, frequency, date) Has there been any documented recent hospitalizations or ED visits since last visit with Clinical Pharmacist? No Brief Summary (including medication and/or Diagnosis changes):   Adherence Review:  Does the Clinical Pharmacist Assistant have access to adherence rates? Yes Adherence rates for STAR metric medications (List medication(s)/day supply/ last 2 fill dates). Adherence rates for medications indicated for disease state being reviewed (List medication(s)/day supply/ last 2 fill dates). Does the patient have >5 day gap between last estimated fill dates for any of the above medications or other medication gaps? No Reason for medication gaps.   Disease State Questions:  Able to connect with Patient? No, 3 attempts made to reach patient. Unable to reach, left messages to return call.  14. Next visit Type: telephone       Visit with:clinical pharmist        Date:11/23/22        Time: 10 am    Care Gaps: Colonoscopy-01/16/13 Diabetic Foot Exam-NA Mammogram-01/01/22 Ophthalmology-NA Dexa Scan - 01/01/22 Annual Well Visit - NA Micro albumin-03/25/22 Hemoglobin A1c- NA  Star Rating Drugs: Atorvastatin 40 mg-last fill 06/27/22 90 ds, 04/15/22 90 ds Losartan 100 mg-last fill 06/24/22 90 ds, 04/27/22 90 ds  [place] [phone]

## 2022-09-06 NOTE — Patient Instructions (Incomplete)
Kidney Doctor next == 10/20/2022 11:20 AM   DASH Eating Plan DASH stands for Dietary Approaches to Stop Hypertension. The DASH eating plan is a healthy eating plan that has been shown to: Reduce high blood pressure (hypertension). Reduce your risk for type 2 diabetes, heart disease, and stroke. Help with weight loss. What are tips for following this plan? Reading food labels Check food labels for the amount of salt (sodium) per serving. Choose foods with less than 5 percent of the Daily Value of sodium. Generally, foods with less than 300 milligrams (mg) of sodium per serving fit into this eating plan. To find whole grains, look for the word "whole" as the first word in the ingredient list. Shopping Buy products labeled as "low-sodium" or "no salt added." Buy fresh foods. Avoid canned foods and pre-made or frozen meals. Cooking Avoid adding salt when cooking. Use salt-free seasonings or herbs instead of table salt or sea salt. Check with your health care provider or pharmacist before using salt substitutes. Do not fry foods. Cook foods using healthy methods such as baking, boiling, grilling, roasting, and broiling instead. Cook with heart-healthy oils, such as olive, canola, avocado, soybean, or sunflower oil. Meal planning  Eat a balanced diet that includes: 4 or more servings of fruits and 4 or more servings of vegetables each day. Try to fill one-half of your plate with fruits and vegetables. 6-8 servings of whole grains each day. Less than 6 oz (170 g) of lean meat, poultry, or fish each day. A 3-oz (85-g) serving of meat is about the same size as a deck of cards. One egg equals 1 oz (28 g). 2-3 servings of low-fat dairy each day. One serving is 1 cup (237 mL). 1 serving of nuts, seeds, or beans 5 times each week. 2-3 servings of heart-healthy fats. Healthy fats called omega-3 fatty acids are found in foods such as walnuts, flaxseeds, fortified milks, and eggs. These fats are also found  in cold-water fish, such as sardines, salmon, and mackerel. Limit how much you eat of: Canned or prepackaged foods. Food that is high in trans fat, such as some fried foods. Food that is high in saturated fat, such as fatty meat. Desserts and other sweets, sugary drinks, and other foods with added sugar. Full-fat dairy products. Do not salt foods before eating. Do not eat more than 4 egg yolks a week. Try to eat at least 2 vegetarian meals a week. Eat more home-cooked food and less restaurant, buffet, and fast food. Lifestyle When eating at a restaurant, ask that your food be prepared with less salt or no salt, if possible. If you drink alcohol: Limit how much you use to: 0-1 drink a day for women who are not pregnant. 0-2 drinks a day for men. Be aware of how much alcohol is in your drink. In the [place] one drink equals one 12 oz bottle of beer (355 mL), one 5 oz glass of wine (148 mL), or one 1 oz glass of hard liquor (44 mL). General information Avoid eating more than 2,300 mg of salt a day. If you have hypertension, you may need to reduce your sodium intake to 1,500 mg a day. Work with your health care provider to maintain a healthy body weight or to lose weight. Ask what an ideal weight is for you. Get at least 30 minutes of exercise that causes your heart to beat faster (aerobic exercise) most days of the week. Activities may include walking, swimming, or biking.  Work with your health care provider or dietitian to adjust your eating plan to your individual calorie needs. What foods should I eat? Fruits All fresh, dried, or frozen fruit. Canned fruit in natural juice (without added sugar). Vegetables Fresh or frozen vegetables (raw, steamed, roasted, or grilled). Low-sodium or reduced-sodium tomato and vegetable juice. Low-sodium or reduced-sodium tomato sauce and tomato paste. Low-sodium or reduced-sodium canned vegetables. Grains Whole-grain or whole-wheat bread. Whole-grain or  whole-wheat pasta. Brown rice. Orpah Cobb. Bulgur. Whole-grain and low-sodium cereals. Pita bread. Low-fat, low-sodium crackers. Whole-wheat flour tortillas. Meats and other proteins Skinless chicken or [place]. Ground chicken or [place]. Pork with fat trimmed off. Fish and seafood. Egg whites. Dried beans, peas, or lentils. Unsalted nuts, nut butters, and seeds. Unsalted canned beans. Lean cuts of beef with fat trimmed off. Low-sodium, lean precooked or cured meat, such as sausages or meat loaves. Dairy Low-fat (1%) or fat-free (skim) milk. Reduced-fat, low-fat, or fat-free cheeses. Nonfat, low-sodium ricotta or cottage cheese. Low-fat or nonfat yogurt. Low-fat, low-sodium cheese. Fats and oils Soft margarine without trans fats. Vegetable oil. Reduced-fat, low-fat, or light mayonnaise and salad dressings (reduced-sodium). Canola, safflower, olive, avocado, soybean, and sunflower oils. Avocado. Seasonings and condiments Herbs. Spices. Seasoning mixes without salt. Other foods Unsalted popcorn and pretzels. Fat-free sweets. The items listed above may not be a complete list of foods and beverages you can eat. Contact a dietitian for more information. What foods should I avoid? Fruits Canned fruit in a light or heavy syrup. Fried fruit. Fruit in cream or butter sauce. Vegetables Creamed or fried vegetables. Vegetables in a cheese sauce. Regular canned vegetables (not low-sodium or reduced-sodium). Regular canned tomato sauce and paste (not low-sodium or reduced-sodium). Regular tomato and vegetable juice (not low-sodium or reduced-sodium). Rosita Fire. Olives. Grains Baked goods made with fat, such as croissants, muffins, or some breads. Dry pasta or rice meal packs. Meats and other proteins Fatty cuts of meat. Ribs. Fried meat. Tomasa Blase. Bologna, salami, and other precooked or cured meats, such as sausages or meat loaves. Fat from the back of a pig (fatback). Bratwurst. Salted nuts and seeds. Canned  beans with added salt. Canned or smoked fish. Whole eggs or egg yolks. Chicken or [place] with skin. Dairy Whole or 2% milk, cream, and half-and-half. Whole or full-fat cream cheese. Whole-fat or sweetened yogurt. Full-fat cheese. Nondairy creamers. Whipped toppings. Processed cheese and cheese spreads. Fats and oils Butter. Stick margarine. Lard. Shortening. Ghee. Bacon fat. Tropical oils, such as coconut, palm kernel, or palm oil. Seasonings and condiments Onion salt, garlic salt, seasoned salt, table salt, and sea salt. Worcestershire sauce. Tartar sauce. Barbecue sauce. Teriyaki sauce. Soy sauce, including reduced-sodium. Steak sauce. Canned and packaged gravies. Fish sauce. Oyster sauce. Cocktail sauce. Store-bought horseradish. Ketchup. Mustard. Meat flavorings and tenderizers. Bouillon cubes. Hot sauces. Pre-made or packaged marinades. Pre-made or packaged taco seasonings. Relishes. Regular salad dressings. Other foods Salted popcorn and pretzels. The items listed above may not be a complete list of foods and beverages you should avoid. Contact a dietitian for more information. Where to find more information National Heart, Lung, and Blood Institute: PopSteam.is American Heart Association: www.heart.org Academy of Nutrition and Dietetics: www.eatright.org National Kidney Foundation: www.kidney.org Summary The DASH eating plan is a healthy eating plan that has been shown to reduce high blood pressure (hypertension). It may also reduce your risk for type 2 diabetes, heart disease, and stroke. When on the DASH eating plan, aim to eat more fresh fruits and vegetables, whole grains, lean  proteins, low-fat dairy, and heart-healthy fats. With the DASH eating plan, you should limit salt (sodium) intake to 2,300 mg a day. If you have hypertension, you may need to reduce your sodium intake to 1,500 mg a day. Work with your health care provider or dietitian to adjust your eating plan to your  individual calorie needs. This information is not intended to replace advice given to you by your health care provider. Make sure you discuss any questions you have with your health care provider. Document Revised: 11/10/2019 Document Reviewed: 11/10/2019 Elsevier Patient Education  2023 [employer].

## 2022-09-09 ENCOUNTER — Ambulatory Visit (INDEPENDENT_AMBULATORY_CARE_PROVIDER_SITE_OTHER): Payer: Medicare HMO | Admitting: Nurse Practitioner

## 2022-09-09 ENCOUNTER — Encounter: Payer: Self-pay | Admitting: Nurse Practitioner

## 2022-09-09 ENCOUNTER — Other Ambulatory Visit: Payer: Self-pay | Admitting: Nurse Practitioner

## 2022-09-09 VITALS — BP 126/79 | HR 65 | Temp 98.2°F | Ht <= 58 in | Wt 152.0 lb

## 2022-09-09 DIAGNOSIS — R801 Persistent proteinuria, unspecified: Secondary | ICD-10-CM | POA: Diagnosis not present

## 2022-09-09 DIAGNOSIS — M8588 Other specified disorders of bone density and structure, other site: Secondary | ICD-10-CM

## 2022-09-09 DIAGNOSIS — D518 Other vitamin B12 deficiency anemias: Secondary | ICD-10-CM | POA: Diagnosis not present

## 2022-09-09 DIAGNOSIS — D631 Anemia in chronic kidney disease: Secondary | ICD-10-CM

## 2022-09-09 DIAGNOSIS — I10 Essential (primary) hypertension: Secondary | ICD-10-CM

## 2022-09-09 DIAGNOSIS — E7800 Pure hypercholesterolemia, unspecified: Secondary | ICD-10-CM | POA: Diagnosis not present

## 2022-09-09 DIAGNOSIS — Z0000 Encounter for general adult medical examination without abnormal findings: Secondary | ICD-10-CM

## 2022-09-09 DIAGNOSIS — K219 Gastro-esophageal reflux disease without esophagitis: Secondary | ICD-10-CM

## 2022-09-09 DIAGNOSIS — F5101 Primary insomnia: Secondary | ICD-10-CM

## 2022-09-09 DIAGNOSIS — Z853 Personal history of malignant neoplasm of breast: Secondary | ICD-10-CM

## 2022-09-09 DIAGNOSIS — N2581 Secondary hyperparathyroidism of renal origin: Secondary | ICD-10-CM | POA: Diagnosis not present

## 2022-09-09 DIAGNOSIS — N1832 Chronic kidney disease, stage 3b: Secondary | ICD-10-CM

## 2022-09-09 DIAGNOSIS — E049 Nontoxic goiter, unspecified: Secondary | ICD-10-CM | POA: Diagnosis not present

## 2022-09-09 DIAGNOSIS — R569 Unspecified convulsions: Secondary | ICD-10-CM | POA: Diagnosis not present

## 2022-09-09 MED ORDER — OMEPRAZOLE 20 MG PO CPDR: 20.0000 mg | DELAYED_RELEASE_CAPSULE | Freq: Every day | ORAL | 4 refills | Status: DC

## 2022-09-09 MED ORDER — ATORVASTATIN CALCIUM 40 MG PO TABS: 40.0000 mg | ORAL_TABLET | Freq: Every day | ORAL | 4 refills | Status: DC

## 2022-09-09 MED ORDER — LOSARTAN POTASSIUM 100 MG PO TABS: 100.0000 mg | ORAL_TABLET | Freq: Every day | ORAL | 4 refills | Status: DC

## 2022-09-09 NOTE — Assessment & Plan Note (Signed)
Chronic, ongoing.  Continue current medication regimen and adjust as needed. Lipid panel today. 

## 2022-09-09 NOTE — Assessment & Plan Note (Signed)
Ongoing on labs with underlying CKD.  Continue Losartan for kidney protection.  Urine ALB up to date with nephrology.

## 2022-09-09 NOTE — Assessment & Plan Note (Signed)
Ongoing, CKD 3.  Continue Losartan for kidney protection.  CMP today.  Continue to collaborate with nephrology, scheduled to see next in October, appreciate their input. Will send labs obtained to them.

## 2022-09-09 NOTE — Assessment & Plan Note (Signed)
Chronic, ongoing.  Continue daily iron supplement.  Recheck iron, B12, and CBC today.  Return to hematology as needed.

## 2022-09-09 NOTE — Assessment & Plan Note (Signed)
History of reported by patient.  Check TSH, Free T4 today.

## 2022-09-09 NOTE — Assessment & Plan Note (Signed)
Mammogram up to date and normal.

## 2022-09-09 NOTE — Assessment & Plan Note (Signed)
Chronic, stable.  Recommend she take Melatonin every night vs as needed for better response.

## 2022-09-09 NOTE — Assessment & Plan Note (Signed)
Ongoing.  Noted on DEXA 01/01/22.  At this time continue Vitamin D supplement daily and adjust as needed.  Plan on repeat DEXA around 01/01/27.

## 2022-09-09 NOTE — Assessment & Plan Note (Signed)
Chronic, stable.  No current supplement as levels have been stable. Check B12 level today and adjust regimen as needed.

## 2022-09-09 NOTE — Progress Notes (Signed)
BP 126/79   Pulse 65   Temp 98.2 F (36.8 C) (Oral)   Ht 4' 7"  (1.397 m)   Wt 152 lb (68.9 kg)   SpO2 98%   BMI 35.33 kg/m    Subjective:    Patient ID: Kelly Poole, female    DOB: 04-28-1951, 71 y.o.   MRN: [id number]  HPI: Kelly Poole is a 71 y.o. female presenting on 09/09/2022 for comprehensive medical examination. Current medical complaints include:none  She currently lives with: self Menopausal Symptoms: no   HYPERTENSION / HYPERLIPIDEMIA Taking Losartan 100 MG daily, Amlodipine 2.5 MG (reports this makes her hair fall out) & Atorvastatin 20 MG daily for HLD.    Followed by neuro for past seizure issues = seen last 05/21/22.  Taking Lamictal 1 tablet at night, has reduced this -- continues to take for at least 2 years. Satisfied with current treatment? yes Duration of hypertension: chronic BP monitoring frequency: daily BP range: 120/70-80 BP medication side effects: no Duration of hyperlipidemia: chronic Cholesterol medication side effects: no Cholesterol supplements: none Medication compliance: good compliance Aspirin: yes Recent stressors: no Recurrent headaches: no Visual changes: no Palpitations: no Dyspnea: no Chest pain: no Lower extremity edema: no Dizzy/lightheaded: no   GERD Continues on Prilosec 20 MG daily. GERD control status: stable  Satisfied with current treatment? yes Heartburn frequency: none Medication side effects: no  Medication compliance: stable Previous GERD medications: OTC medications Antacid use frequency:  none Dysphagia: no Odynophagia:  no Hematemesis: no Blood in stool: no EGD: no  CHRONIC KIDNEY DISEASE Saw nephrology last on 05/20/22 with CRT 1.38 and eGFR 42, PTH 77, H/H 11.2/34.2 = labs done in May 2023 with them.   Taking iron daily & B12 for past anemia in CKD, was followed by hematology for this with last visit 06/02/22 to return PRN. CKD status: stable Medications renally dose: no Previous renal  evaluation: no Pneumovax:   refused Influenza Vaccine:  refused  INSOMNIA Reports she takes Melatonin as needed, which works well and puts her to sleep -- discussed with her taking every night vs as needed. Duration: chronic Satisfied with sleep quality: no Difficulty falling asleep: yes Difficulty staying asleep: no Waking a few hours after sleep onset: no Early morning awakenings: no Daytime hypersomnolence: no Wakes feeling refreshed: yes Good sleep hygiene: yes Apnea: no Snoring: no Depressed/anxious mood: no Recent stress: no Restless legs/nocturnal leg cramps: no Chronic pain/arthritis: no History of sleep study: no Treatments attempted: melatonin    OSTEOPENIA: Continues on daily supplement. Takes daily Vitamin D supplement too, no recent falls or fractures.  DEXA scan done 01/01/22 with T-score -2.3.   Satisfied with current treatment?: yes Adequate calcium & vitamin D: yes Weight bearing exercises: yes   Depression Screen done today and results listed below:     09/09/2022    8:15 AM 06/16/2022    8:22 AM 03/25/2022    8:43 AM 10/20/2021   11:18 AM 09/08/2021    8:08 AM  Depression screen PHQ 2/9  Decreased Interest 0 0 0 0 0  Down, Depressed, Hopeless 0 0 0 0 0  PHQ - 2 Score 0 0 0 0 0  Altered sleeping 3 3 3     Tired, decreased energy 3 2 2     Change in appetite 0 2 0    Feeling bad or failure about yourself  0 0 0    Trouble concentrating 0 0 0    Moving slowly or fidgety/restless 0  0 0    Suicidal thoughts 0 0 0    PHQ-9 Score 6 7 5     Difficult doing work/chores Not difficult at all Somewhat difficult          09/09/2022    8:15 AM 06/16/2022    8:21 AM 10/20/2021   11:17 AM 09/08/2021    9:13 AM 07/02/2021    9:10 AM  Fall Risk   Falls in the past year? 0 0 0 0 0  Number falls in past yr: 0 0  0 0  Injury with Fall? 0 0  0 0  Risk for fall due to : No Fall Risks No Fall Risks Medication side effect No Fall Risks No Fall Risks  Follow up Falls  evaluation completed Falls evaluation completed Falls evaluation completed;Education provided;Falls prevention discussed Education provided Falls evaluation completed    Functional Status Survey: Is the patient deaf or have difficulty hearing?: No Does the patient have difficulty seeing, even when wearing glasses/contacts?: No Does the patient have difficulty concentrating, remembering, or making decisions?: No Does the patient have difficulty walking or climbing stairs?: No Does the patient have difficulty dressing or bathing?: No Does the patient have difficulty doing errands alone such as visiting a doctor's office or shopping?: No    Past Medical History:  Past Medical History:  Diagnosis Date   Breast cancer ([place]) 1991   left breast mastectomy. No chemo or rad tx   GERD (gastroesophageal reflux disease)    Heart murmur    High cholesterol    Hypertension    Seizures ([place])     Surgical History:  Past Surgical History:  Procedure Laterality Date   ABDOMINAL HYSTERECTOMY  1978   MASTECTOMY Left 1991   Due to breast cancer   TUBAL LIGATION      Medications:  Current Outpatient Medications on File Prior to Visit  Medication Sig   Ascorbic Acid (VITAMIN C) 1000 MG tablet Take 1,000 mg by mouth daily.   aspirin 81 MG tablet Take 81 mg by mouth daily.   betamethasone dipropionate 0.05 % cream Apply topically 2 (two) times daily.   Cholecalciferol 50 MCG (2000 UT) TABS Take 1 tablet by mouth daily.   FEROSUL 325 (65 Fe) MG tablet TAKE 1 TABLET EVERY DAY WITH BREAKFAST   Garlic 1219 MG CAPS Take 1,000 mg by mouth daily.   lamoTRIgine (LAMICTAL) 25 MG tablet Take 1 tablet by mouth at bedtime.   Multiple Vitamin (MULTIVITAMINS PO) Take by mouth daily.   vitamin E 100 UNIT capsule Take 100 Units by mouth daily.   No current facility-administered medications on file prior to visit.    Allergies:  Allergies  Allergen Reactions   Macrobid [Nitrofurantoin Macrocrystal] Other  (See Comments)    Sore throat and irritation to throat    Social History:  Social History   Socioeconomic History   Marital status: Single    Spouse name: Not on file   Number of children: Not on file   Years of education: Not on file   Highest education level: Not on file  Occupational History   Not on file  Tobacco Use   Smoking status: Never   Smokeless tobacco: Never  Vaping Use   Vaping Use: Never used  Substance and Sexual Activity   Alcohol use: No    Alcohol/week: 0.0 standard drinks of alcohol   Drug use: No   Sexual activity: Yes  Other Topics Concern   Not on file  Social History Narrative   Not on file   Social Determinants of Health   Financial Resource Strain: High Risk (02/16/2022)   Overall Financial Resource Strain (CARDIA)    Difficulty of Paying Living Expenses: Very hard  Food Insecurity: Food Insecurity Present (02/16/2022)   Hunger Vital Sign    Worried About Running Out of Food in the Last Year: Often true    Ran Out of Food in the Last Year: Often true  Transportation Needs: No Transportation Needs (03/23/2022)   PRAPARE - Hydrologist (Medical): No    Lack of Transportation (Non-Medical): No  Physical Activity: Inactive (10/20/2021)   Exercise Vital Sign    Days of Exercise per Week: 0 days    Minutes of Exercise per Session: 0 min  Stress: No Stress Concern Present (10/20/2021)   [place]    Feeling of Stress : Not at all  Social Connections: Socially Isolated (09/17/2021)   Social Connection and Isolation Panel [NHANES]    Frequency of Communication with Friends and Family: More than three times a week    Frequency of Social Gatherings with Friends and Family: More than three times a week    Attends Religious Services: Never    Marine scientist or Organizations: No    Attends Archivist Meetings: Never    Marital Status: Never  married  Intimate Partner Violence: Not At Risk (09/17/2021)   Humiliation, Afraid, Rape, and Kick questionnaire    Fear of Current or Ex-Partner: No    Emotionally Abused: No    Physically Abused: No    Sexually Abused: No   Social History   Tobacco Use  Smoking Status Never  Smokeless Tobacco Never   Social History   Substance and Sexual Activity  Alcohol Use No   Alcohol/week: 0.0 standard drinks of alcohol    Family History:  Family History  Problem Relation Age of Onset   Stroke Mother    Thyroid disease Mother    Cancer Father    Diabetes Maternal Aunt    Breast cancer Other     Past medical history, surgical history, medications, allergies, family history and social history reviewed with patient today and changes made to appropriate areas of the chart.   Review of Systems - negative All other ROS negative except what is listed above and in the HPI.      Objective:    BP 126/79   Pulse 65   Temp 98.2 F (36.8 C) (Oral)   Ht 4' 7"  (1.397 m)   Wt 152 lb (68.9 kg)   SpO2 98%   BMI 35.33 kg/m   Wt Readings from Last 3 Encounters:  09/09/22 152 lb (68.9 kg)  06/16/22 152 lb (68.9 kg)  06/02/22 150 lb (68 kg)    Physical Exam Vitals and nursing note reviewed.  Constitutional:      General: She is awake. She is not in acute distress.    Appearance: She is well-developed and well-groomed. She is obese. She is not ill-appearing or toxic-appearing.  HENT:     Head: Normocephalic and atraumatic.     Right Ear: Hearing, tympanic membrane, ear canal and external ear normal. No drainage.     Left Ear: Hearing, tympanic membrane, ear canal and external ear normal. No drainage.     Nose: Nose normal.     Right Sinus: No maxillary sinus tenderness or frontal sinus tenderness.  Left Sinus: No maxillary sinus tenderness or frontal sinus tenderness.     Mouth/Throat:     Mouth: Mucous membranes are moist.     Pharynx: Oropharynx is clear. Uvula midline. No  pharyngeal swelling, oropharyngeal exudate or posterior oropharyngeal erythema.  Eyes:     General: Lids are normal.        Right eye: No discharge.        Left eye: No discharge.     Extraocular Movements: Extraocular movements intact.     Conjunctiva/sclera: Conjunctivae normal.     Pupils: Pupils are equal, round, and reactive to light.     Visual Fields: Right eye visual fields normal and left eye visual fields normal.  Neck:     Thyroid: No thyromegaly.     Vascular: No carotid bruit.     Trachea: Trachea normal.  Cardiovascular:     Rate and Rhythm: Normal rate and regular rhythm.     Heart sounds: Normal heart sounds. No murmur heard.    No gallop.  Pulmonary:     Effort: Pulmonary effort is normal. No accessory muscle usage or respiratory distress.     Breath sounds: Normal breath sounds.  Chest:  Breasts:    Right: Normal.     Left: Absent.  Abdominal:     General: Bowel sounds are normal.     Palpations: Abdomen is soft. There is no hepatomegaly or splenomegaly.     Tenderness: There is no abdominal tenderness.  Musculoskeletal:        General: Normal range of motion.     Cervical back: Normal range of motion and neck supple.     Right lower leg: No edema.     Left lower leg: No edema.  Lymphadenopathy:     Head:     Right side of head: No submental, submandibular, tonsillar, preauricular or posterior auricular adenopathy.     Left side of head: No submental, submandibular, tonsillar, preauricular or posterior auricular adenopathy.     Cervical: No cervical adenopathy.     Upper Body:     Right upper body: No supraclavicular, axillary or pectoral adenopathy.  Skin:    General: Skin is warm and dry.     Capillary Refill: Capillary refill takes less than 2 seconds.     Findings: No rash.  Neurological:     Mental Status: She is alert and oriented to person, place, and time.     Gait: Gait is intact.     Deep Tendon Reflexes: Reflexes are normal and symmetric.      Reflex Scores:      Brachioradialis reflexes are 2+ on the right side and 2+ on the left side.      Patellar reflexes are 2+ on the right side and 2+ on the left side. Psychiatric:        Attention and Perception: Attention normal.        Mood and Affect: Mood normal.        Speech: Speech normal.        Behavior: Behavior normal. Behavior is cooperative.        Thought Content: Thought content normal.        Judgment: Judgment normal.    Results for orders placed or performed in visit on 05/26/22  Hemochromatosis DNA-PCR(c282y,h63d)  Result Value Ref Range   DNA Mutation Analysis Comment    Reviewed by: Comment   Hgb Fractionation Cascade  Result Value Ref Range   Hgb F 0.0 0.0 -  2.0 %   Hgb A 97.6 96.4 - 98.8 %   Hgb A2 2.4 1.8 - 3.2 %   Hgb S 0.0 0.0 %   Interpretation, Hgb Fract Comment   Ferritin  Result Value Ref Range   Ferritin 256 11 - 307 ng/mL  Iron and TIBC([facility])  Result Value Ref Range   Iron 80 28 - 170 ug/dL   TIBC 251 250 - 450 ug/dL   Saturation Ratios 32 (H) 10.4 - 31.8 %   UIBC 171 ug/dL  CBC with Differential/Platelet  Result Value Ref Range   WBC 6.7 4.0 - 10.5 K/uL   RBC 3.45 (L) 3.87 - 5.11 MIL/uL   Hemoglobin 10.6 (L) 12.0 - 15.0 g/dL   HCT 32.4 (L) 36.0 - 46.0 %   MCV 93.9 80.0 - 100.0 fL   MCH 30.7 26.0 - 34.0 pg   MCHC 32.7 30.0 - 36.0 g/dL   RDW 12.5 11.5 - 15.5 %   Platelets 267 150 - 400 K/uL   nRBC 0.0 0.0 - 0.2 %   Neutrophils Relative % 48 %   Neutro Abs 3.2 1.7 - 7.7 K/uL   Lymphocytes Relative 36 %   Lymphs Abs 2.4 0.7 - 4.0 K/uL   Monocytes Relative 11 %   Monocytes Absolute 0.7 0.1 - 1.0 K/uL   Eosinophils Relative 4 %   Eosinophils Absolute 0.3 0.0 - 0.5 K/uL   Basophils Relative 1 %   Basophils Absolute 0.0 0.0 - 0.1 K/uL   Immature Granulocytes 0 %   Abs Immature Granulocytes 0.02 0.00 - 0.07 K/uL      Assessment & Plan:   Problem List Items Addressed This Visit       Cardiovascular and Mediastinum    Hypertension    Chronic, stable with BP at goal today. Will continue Losartan 100 MG, but stop Amlodipine due to stable BP with less stressors and concern for hair loss.  Recommend she monitor BP at home at least as few mornings a week and document, report to provider if consistent elevation >130/90.  DASH diet focus.  LABS: CBC, CMP, TSH, Lipid today.  Return in 6 months.      Relevant Medications   atorvastatin (LIPITOR) 40 MG tablet   losartan (COZAAR) 100 MG tablet   Other Relevant Orders   Comprehensive metabolic panel   TSH     Digestive   Gastroesophageal reflux disease (Chronic)    Chronic, stable on Omeprazole.  Reports return of symptoms without medication, when has trialed GDR in past.  Continue current dose and adjust as needed.  Mag level annually.  Return in 6 months.      Relevant Medications   omeprazole (PRILOSEC) 20 MG capsule   Other Relevant Orders   Magnesium     Endocrine   Secondary hyperparathyroidism (HCC)    Chronic, stable.  Continue to collaborate with nephrology.  PTH on labs is up to date.      Relevant Orders   CBC with Differential/Platelet   Comprehensive metabolic panel   Thyroid goiter    History of reported by patient.  Check TSH, Free T4 today.      Relevant Orders   TSH   T4, free     Musculoskeletal and Integument   Osteopenia of lumbar spine    Ongoing.  Noted on DEXA 01/01/22.  At this time continue Vitamin D supplement daily and adjust as needed.  Plan on repeat DEXA around 01/01/27.  Relevant Orders   VITAMIN D 25 Hydroxy (Vit-D Deficiency, Fractures)     Genitourinary   CKD (chronic kidney disease) stage 3, GFR 30-59 ml/min (HCC)    Ongoing, CKD 3.  Continue Losartan for kidney protection.  CMP today.  Continue to collaborate with nephrology, scheduled to see next in October, appreciate their input. Will send labs obtained to them.      Relevant Orders   CBC with Differential/Platelet   Comprehensive metabolic panel      Other   Anemia    Chronic, ongoing.  Continue daily iron supplement.  Recheck iron, B12, and CBC today.  Return to hematology as needed.      Relevant Orders   CBC with Differential/Platelet   Iron Binding Cap (TIBC)([facility])   Ferritin   HX: breast cancer    Mammogram up to date and normal.      Hyperlipidemia    Chronic, ongoing.  Continue current medication regimen and adjust as needed.  Lipid panel today.      Relevant Medications   atorvastatin (LIPITOR) 40 MG tablet   losartan (COZAAR) 100 MG tablet   Other Relevant Orders   Comprehensive metabolic panel   Lipid Panel w/o Chol/HDL Ratio   Insomnia    Chronic, stable.  Recommend she take Melatonin every night vs as needed for better response.      Persistent proteinuria    Ongoing on labs with underlying CKD.  Continue Losartan for kidney protection.  Urine ALB up to date with nephrology.      Seizure ([place]) - Primary    Chronic, stable at this time.  Continue to collaborate with neurology and continue current medication regimen as prescribed by them.      Relevant Orders   Comprehensive metabolic panel   Vitamin Y40 deficiency (dietary) anemia    Chronic, stable.  No current supplement as levels have been stable. Check B12 level today and adjust regimen as needed.      Relevant Orders   CBC with Differential/Platelet   Vitamin B12   Other Visit Diagnoses     Encounter for annual physical exam       Annual physical today with labs and health maintenance reviewed, discussed with patient.        Follow up plan: Return in about 6 months (around 03/10/2023) for HTN/HLD, SEIZURES, CKD.   LABORATORY TESTING:  - Pap smear: not applicable  IMMUNIZATIONS:  -- is concerned about taking vaccines due to her past seizure - Tdap: Tetanus vaccination status reviewed: Refused. - Influenza: Refused - Pneumovax: Refused - Prevnar: Refused - HPV: Not applicable - Zostavax vaccine:  Refused  SCREENING: -Mammogram: Up to date -- last 01/01/22 - Colonoscopy: Up to date  - Bone Density: Up To Date on 01/01/22 -Hearing Test: Not applicable  -Spirometry: Not applicable   PATIENT COUNSELING:   Advised to take 1 mg of folate supplement per day if capable of pregnancy.   Sexuality: Discussed sexually transmitted diseases, partner selection, use of condoms, avoidance of unintended pregnancy  and contraceptive alternatives.   Advised to avoid cigarette smoking.  I discussed with the patient that most people either abstain from alcohol or drink within safe limits (<=14/week and <=4 drinks/occasion for males, <=7/weeks and <= 3 drinks/occasion for females) and that the risk for alcohol disorders and other health effects rises proportionally with the number of drinks per week and how often a drinker exceeds daily limits.  Discussed cessation/primary prevention of drug use and availability of  treatment for abuse.   Diet: Encouraged to adjust caloric intake to maintain  or achieve ideal body weight, to reduce intake of dietary saturated fat and total fat, to limit sodium intake by avoiding high sodium foods and not adding table salt, and to maintain adequate dietary potassium and calcium preferably from fresh fruits, vegetables, and low-fat dairy products.    Stressed the importance of regular exercise  Injury prevention: Discussed safety belts, safety helmets, smoke detector, smoking near bedding or upholstery.   Dental health: Discussed importance of regular tooth brushing, flossing, and dental visits.    NEXT PREVENTATIVE PHYSICAL DUE IN 1 YEAR. Return in about 6 months (around 03/10/2023) for HTN/HLD, SEIZURES, CKD.

## 2022-09-09 NOTE — Assessment & Plan Note (Signed)
Chronic, stable with BP at goal today. Will continue Losartan 100 MG, but stop Amlodipine due to stable BP with less stressors and concern for hair loss.  Recommend she monitor BP at home at least as few mornings a week and document, report to provider if consistent elevation >130/90.  DASH diet focus.  LABS: CBC, CMP, TSH, Lipid today.  Return in 6 months.

## 2022-09-09 NOTE — Assessment & Plan Note (Signed)
Chronic, stable.  Continue to collaborate with nephrology.  PTH on labs is up to date.

## 2022-09-09 NOTE — Assessment & Plan Note (Signed)
Chronic, stable at this time.  Continue to collaborate with neurology and continue current medication regimen as prescribed by them.

## 2022-09-09 NOTE — Assessment & Plan Note (Signed)
Chronic, stable on Omeprazole.  Reports return of symptoms without medication, when has trialed GDR in past.  Continue current dose and adjust as needed.  Mag level annually.  Return in 6 months.

## 2022-09-10 ENCOUNTER — Other Ambulatory Visit: Payer: Self-pay | Admitting: Nurse Practitioner

## 2022-09-10 ENCOUNTER — Telehealth: Payer: Self-pay

## 2022-09-10 LAB — TSH: TSH: 3.76 u[IU]/mL (ref 0.450–4.500)

## 2022-09-10 LAB — CBC WITH DIFFERENTIAL/PLATELET
Basophils Absolute: 0.1 10*3/uL (ref 0.0–0.2)
Basos: 1 %
EOS (ABSOLUTE): 0.3 10*3/uL (ref 0.0–0.4)
Eos: 5 %
Hematocrit: 33.6 % — ABNORMAL LOW (ref 34.0–46.6)
Hemoglobin: 10.8 g/dL — ABNORMAL LOW (ref 11.1–15.9)
Immature Grans (Abs): 0 10*3/uL (ref 0.0–0.1)
Immature Granulocytes: 0 %
Lymphocytes Absolute: 2.5 10*3/uL (ref 0.7–3.1)
Lymphs: 39 %
MCH: 30.8 pg (ref 26.6–33.0)
MCHC: 32.1 g/dL (ref 31.5–35.7)
MCV: 96 fL (ref 79–97)
Monocytes Absolute: 0.5 10*3/uL (ref 0.1–0.9)
Monocytes: 7 %
Neutrophils Absolute: 3.1 10*3/uL (ref 1.4–7.0)
Neutrophils: 48 %
Platelets: 292 10*3/uL (ref 150–450)
RBC: 3.51 x10E6/uL — ABNORMAL LOW (ref 3.77–5.28)
RDW: 13.1 % (ref 11.7–15.4)
WBC: 6.4 10*3/uL (ref 3.4–10.8)

## 2022-09-10 LAB — COMPREHENSIVE METABOLIC PANEL
ALT: 23 IU/L (ref 0–32)
AST: 23 IU/L (ref 0–40)
Albumin/Globulin Ratio: 2 (ref 1.2–2.2)
Albumin: 4.8 g/dL (ref 3.9–4.9)
Alkaline Phosphatase: 113 IU/L (ref 44–121)
BUN/Creatinine Ratio: 17 (ref 12–28)
BUN: 23 mg/dL (ref 8–27)
Bilirubin Total: 0.3 mg/dL (ref 0.0–1.2)
CO2: 21 mmol/L (ref 20–29)
Calcium: 10 mg/dL (ref 8.7–10.3)
Chloride: 103 mmol/L (ref 96–106)
Creatinine, Ser: 1.35 mg/dL — ABNORMAL HIGH (ref 0.57–1.00)
Globulin, Total: 2.4 g/dL (ref 1.5–4.5)
Glucose: 100 mg/dL — ABNORMAL HIGH (ref 70–99)
Potassium: 4.6 mmol/L (ref 3.5–5.2)
Sodium: 138 mmol/L (ref 134–144)
Total Protein: 7.2 g/dL (ref 6.0–8.5)
eGFR: 43 mL/min/{1.73_m2} — ABNORMAL LOW (ref >59)

## 2022-09-10 LAB — IRON AND TIBC
Iron Saturation: 37 % (ref 15–55)
Iron: 85 ug/dL (ref 27–139)
Total Iron Binding Capacity: 230 ug/dL — ABNORMAL LOW (ref 250–450)
UIBC: 145 ug/dL (ref 118–369)

## 2022-09-10 LAB — LIPID PANEL W/O CHOL/HDL RATIO
Cholesterol, Total: 187 mg/dL (ref 100–199)
HDL: 51 mg/dL (ref >39)
LDL Chol Calc (NIH): 118 mg/dL — ABNORMAL HIGH (ref 0–99)
Triglycerides: 97 mg/dL (ref 0–149)
VLDL Cholesterol Cal: 18 mg/dL (ref 5–40)

## 2022-09-10 LAB — MAGNESIUM: Magnesium: 1.8 mg/dL (ref 1.6–2.3)

## 2022-09-10 LAB — T4, FREE: Free T4: 1.21 ng/dL (ref 0.82–1.77)

## 2022-09-10 LAB — VITAMIN B12: Vitamin B-12: 925 pg/mL (ref 232–1245)

## 2022-09-10 LAB — VITAMIN D 25 HYDROXY (VIT D DEFICIENCY, FRACTURES): Vit D, 25-Hydroxy: 50.4 ng/mL (ref 30.0–100.0)

## 2022-09-10 LAB — FERRITIN: Ferritin: 355 ng/mL — ABNORMAL HIGH (ref 15–150)

## 2022-09-10 MED ORDER — ROSUVASTATIN CALCIUM 40 MG PO TABS: 40.0000 mg | ORAL_TABLET | Freq: Every day | ORAL | 4 refills | Status: DC

## 2022-09-10 NOTE — Progress Notes (Signed)
Contacted via [place] afternoon [place], your labs have returned: - CBC shows ongoing and baseline for you lower hemoglobin and hematocrit (anemia) this is from your kidney disease and is not worsening so we monitor only at this time. - Kidney function, creatinine and eGFR, continues to show chronic kidney disease stage 3b which is your baseline and not worsening.  Good news.  Liver function, AST and ALT, is normal. - Your LDL, bad cholesterol, remains a little elevated.  Are you taking Atorvastatin every day?  You may benefit from change to Rosuvastatin which may lower this level more and less side effect risk.  Would you like to try this change?  Would like to see LDL <70. - Iron level is normal, Ferritin a little high similar to past labs -- no worsening.  We will monitor. - Thyroid, TSH, is normal, as is Vitamin D and B12 level.  Any questions? Keep being amazing!!  Thank you for allowing me to participate in your care.  I appreciate you. Kindest regards, Nayleen Janosik

## 2022-09-10 NOTE — Addendum Note (Signed)
Addended by: Marnee Guarneri T on: 09/10/2022 06:09 PM   Modules accepted: Orders

## 2022-09-10 NOTE — Telephone Encounter (Signed)
Copied from [place] [phone]. Topic: General - Other >> Sep 10, 2022  2:24 PM Rosanne Ashing P wrote: Reason for CRM: pt called saying she wants to change to Lovastatin for cholesterol and please send to the [facility]

## 2022-09-11 ENCOUNTER — Ambulatory Visit: Payer: Self-pay

## 2022-09-11 NOTE — Telephone Encounter (Signed)
Patient notified of Jolene's message.   °

## 2022-09-11 NOTE — Telephone Encounter (Signed)
  Chief Complaint: Should pt continue Fe and Vit C Symptoms:  Frequency:  Pertinent Negatives: Patient denies  Disposition: [] ED /[] Urgent Care (no appt availability in office) / [] Appointment(In office/virtual)/ []  [facility]/ [] Home Care/ [] Refused Recommended Disposition /[] [place] [facility]/ [x]  Follow-up with PCP Additional Notes: Shared provider's note concerning Ferritin levels with pt.  No Mention if provider wants pt to continue supplement.   Please advise.     Summary: FE advice    Pt is calling to ask if Kelly would like her to stop taking FE since her Ferritin was elevated. Please advise        Venita Lick, NP  09/10/2022  1:37 PM EDT Back to Top    Contacted via MyChart     Good afternoon Kelly Poole, your labs have returned: - CBC shows ongoing and baseline for you lower hemoglobin and hematocrit (anemia) this is from your kidney disease and is not worsening so we monitor only at this time. - Kidney function, creatinine and eGFR, continues to show chronic kidney disease stage 3b which is your baseline and not worsening.  Good news.  Liver function, AST and ALT, is normal. - Your LDL, bad cholesterol, remains a little elevated.  Are you taking Atorvastatin every day?  You may benefit from change to Rosuvastatin which may lower this level more and less side effect risk.  Would you like to try this change?  Would like to see LDL <70. - Iron level is normal, Ferritin a little high similar to past labs -- no worsening.  We will monitor. - Thyroid, TSH, is normal, as is Vitamin D and B12 level.  Any questions? Keep being amazing!!  Thank you for allowing me to participate in your care.  I appreciate you. Kindest regards, Kelly   Reason for Disposition  [1] Caller requesting NON-URGENT health information AND [2] PCP's office is the best resource  Answer Assessment - Initial Assessment Questions 1. REASON FOR CALL or QUESTION: "What is your reason for  calling today?" or "How can I best help you?" or "What question do you have that I can help answer?"     Should pt continue Fe?  Protocols used: Information Only Call - No Triage-A-AH

## 2022-09-11 NOTE — Telephone Encounter (Signed)
Summary: FE advice   Pt is calling to ask if Kelly Poole would like her to stop taking FE since her Ferritin was elevated. Please advise     Called pt - [id number]

## 2022-09-11 NOTE — Telephone Encounter (Signed)
Called and LVM notifying patient that the medication was sent in to [facility] for her.

## 2022-09-11 NOTE — Telephone Encounter (Signed)
Change of pharmacy to mail order  Requested Prescriptions  Pending Prescriptions Disp Refills  . losartan (COZAAR) 100 MG tablet [Pharmacy Med Name: LOSARTAN POTASSIUM 100 MG Tablet] 90 tablet 4    Sig: TAKE 1 TABLET EVERY DAY     Cardiovascular:  Angiotensin Receptor Blockers Failed - 09/10/2022  5:07 PM      Failed - Cr in normal range and within 180 days    Creatinine, Ser  Date Value Ref Range Status  09/09/2022 1.35 (H) 0.57 - 1.00 mg/dL Final   Creatinine, Urine  Date Value Ref Range Status  12/23/2020 102 mg/dL Final    Comment:    Performed at [facility], [address] [place], [place] [address]         Passed - K in normal range and within 180 days    Potassium  Date Value Ref Range Status  09/09/2022 4.6 3.5 - 5.2 mmol/L Final         Passed - Patient is not pregnant      Passed - Last BP in normal range    BP Readings from Last 1 Encounters:  09/09/22 126/79         Passed - Valid encounter within last 6 months    Recent Outpatient Visits          2 days ago Seizure [facility])   [place], [place] T, NP   2 months ago Allergic contact dermatitis due to plants, except food   [place], NP   5 months ago Seizure [facility])   [place], Morada T, NP   1 year ago Seizure ([place])   [place], Barbaraann Faster, NP   1 year ago Seizure ([facility])   [place], Barbaraann Faster, NP      Future Appointments            In 1 month  [place], [place]   In 6 months [place], Barbaraann Faster, NP [employer]

## 2022-09-14 ENCOUNTER — Ambulatory Visit: Payer: Self-pay | Admitting: *Deleted

## 2022-09-14 NOTE — Telephone Encounter (Signed)
Left message for patient to make her aware of Jolene's recommendations. Advised patient to give our office a call back if she has any questions or concerns.

## 2022-09-14 NOTE — Telephone Encounter (Signed)
Summary: Vitamin C advice   Pt is calling ask Kelly Poole since she was taken off of vit c can she take orange juice? Please advise       Patient returned call to request if she can still drink a small glass of orange juice in the mornings, since she has been taken off of Vit. C? Please advise.     Reason for Disposition  [1] Caller requesting NON-URGENT health information AND [2] PCP's office is the best resource  Answer Assessment - Initial Assessment Questions 1. REASON FOR CALL or QUESTION: "What is your reason for calling today?" or "How can I best help you?" or "What question do you have that I can help answer?"     Patient would like to know if she can continue to drink orange juice , small glass, in the mornings since she was taken off of vit C?  Protocols used: Information Only Call - No Triage-A-AH

## 2022-09-14 NOTE — Telephone Encounter (Signed)
Pt is calling ask Jolene since she was taken off of vit c can she take orange juice? Please advise

## 2022-09-29 ENCOUNTER — Other Ambulatory Visit: Payer: Self-pay | Admitting: Nurse Practitioner

## 2022-09-30 NOTE — Telephone Encounter (Signed)
Unable to refill per protocol, last refill by provider 09/09/22 for 90 and 4. Request is too soon.Will refuse.   Requested Prescriptions  Pending Prescriptions Disp Refills  . omeprazole (PRILOSEC) 20 MG capsule [Pharmacy Med Name: OMEPRAZOLE 20 MG Capsule Delayed Release] 90 capsule 10    Sig: TAKE 1 CAPSULE EVERY DAY     Gastroenterology: Proton Pump Inhibitors Passed - 09/29/2022  7:33 PM      Passed - Valid encounter within last 12 months    Recent Outpatient Visits          3 weeks ago Seizure ([place])   [place], [place] T, NP   3 months ago Allergic contact dermatitis due to plants, except food   [facility] Kathrine Haddock, NP   6 months ago Seizure [facility])   [place], Henrine Screws T, NP   1 year ago Seizure ([place])   [place], Barbaraann Faster, NP   1 year ago Seizure ([place])   [place], Barbaraann Faster, NP      Future Appointments            In 3 weeks  [place], [place]   In 5 months Cannady, Barbaraann Faster, NP [employer]

## 2022-10-21 ENCOUNTER — Ambulatory Visit: Payer: Medicare HMO

## 2022-10-22 ENCOUNTER — Telehealth: Payer: Self-pay

## 2022-10-22 ENCOUNTER — Ambulatory Visit (INDEPENDENT_AMBULATORY_CARE_PROVIDER_SITE_OTHER): Payer: Medicare HMO | Admitting: *Deleted

## 2022-10-22 DIAGNOSIS — Z0000 Encounter for general adult medical examination without abnormal findings: Secondary | ICD-10-CM

## 2022-10-22 NOTE — Telephone Encounter (Signed)
   Telephone encounter was:  Unsuccessful.  10/22/2022 Name: Kelly Poole MRN: [id number] DOB: October 19, 1951  Unsuccessful outbound call made today to assist with:   utilities  Outreach Attempt:  1st Attempt  A HIPAA compliant voice message was left requesting a return call.  Instructed patient to call back    [place], [facility] Management  [phone] [address], [place], [place] [address] Phone: [phone] Email: [email or web address]

## 2022-10-22 NOTE — Progress Notes (Signed)
Subjective:   Kelly Poole is a 71 y.o. female who presents for Medicare Annual (Subsequent) preventive examination.  I connected with  Kelly Poole on 10/22/22 by a telephone enabled telemedicine application and verified that I am speaking with the correct person using two identifiers.   I discussed the limitations of evaluation and management by telemedicine. The patient expressed understanding and agreed to proceed.  Patient location: home  Provider location: Tele-health-home     Review of Systems     Cardiac Risk Factors include: advanced age (>62mn, >>55women);hypertension     Objective:    Today's Vitals   There is no height or weight on file to calculate BMI.     10/22/2022    8:35 AM 06/02/2022    9:29 AM 10/20/2021   11:17 AM 12/25/2020    1:35 AM 12/23/2020   12:50 PM 10/14/2020    3:23 PM 03/14/2020    3:26 PM  Advanced Directives  Does Patient Have a Medical Advance Directive? No No No No No No No  Would patient like information on creating a medical advance directive? No - Patient declined No - Patient declined  No - Patient declined No - Patient declined  Yes (MAU/Ambulatory/Procedural Areas - Information given)    Current Medications (verified) Outpatient Encounter Medications as of 10/22/2022  Medication Sig   Ascorbic Acid (VITAMIN C) 1000 MG tablet Take 1,000 mg by mouth daily.   aspirin 81 MG tablet Take 81 mg by mouth daily.   Cholecalciferol 50 MCG (2000 UT) TABS Take 1 tablet by mouth daily.   FEROSUL 325 (65 Fe) MG tablet TAKE 1 TABLET EVERY DAY WITH BREAKFAST   Garlic 12831MG CAPS Take 1,000 mg by mouth daily.   losartan (COZAAR) 100 MG tablet TAKE 1 TABLET EVERY DAY   Multiple Vitamin (MULTIVITAMINS PO) Take by mouth daily.   omeprazole (PRILOSEC) 20 MG capsule Take 1 capsule (20 mg total) by mouth daily.   rosuvastatin (CRESTOR) 40 MG tablet Take 1 tablet (40 mg total) by mouth daily.   vitamin E 100 UNIT capsule Take 100 Units by mouth  daily.   betamethasone dipropionate 0.05 % cream Apply topically 2 (two) times daily. (Patient not taking: Reported on 10/22/2022)   lamoTRIgine (LAMICTAL) 25 MG tablet Take 1 tablet by mouth at bedtime.   No facility-administered encounter medications on file as of 10/22/2022.    Allergies (verified) Macrobid [nitrofurantoin macrocrystal]   History: Past Medical History:  Diagnosis Date   Breast cancer ([place] 1991   left breast mastectomy. No chemo or rad tx   GERD (gastroesophageal reflux disease)    Heart murmur    High cholesterol    Hypertension    Seizures (HCC)    Past Surgical History:  Procedure Laterality Date   ABDOMINAL HYSTERECTOMY  1978   MASTECTOMY Left 1991   Due to breast cancer   TUBAL LIGATION     Family History  Problem Relation Age of Onset   Stroke Mother    Thyroid disease Mother    Cancer Father    Diabetes Maternal Aunt    Breast cancer Other    Social History   Socioeconomic History   Marital status: Single    Spouse name: Not on file   Number of children: Not on file   Years of education: Not on file   Highest education level: Not on file  Occupational History   Not on file  Tobacco Use   Smoking  status: Never   Smokeless tobacco: Never  Vaping Use   Vaping Use: Never used  Substance and Sexual Activity   Alcohol use: No    Alcohol/week: 0.0 standard drinks of alcohol   Drug use: No   Sexual activity: Yes  Other Topics Concern   Not on file  Social History Narrative   Not on file   Social Determinants of Health   Financial Resource Strain: High Risk (10/22/2022)   Overall Financial Resource Strain (CARDIA)    Difficulty of Paying Living Expenses: Hard  Food Insecurity: Food Insecurity Present (02/16/2022)   Hunger Vital Sign    Worried About Running Out of Food in the Last Year: Often true    Ran Out of Food in the Last Year: Often true  Transportation Needs: No Transportation Needs (10/22/2022)   PRAPARE - Armed forces logistics/support/administrative officer (Medical): No    Lack of Transportation (Non-Medical): No  Physical Activity: Insufficiently Active (10/22/2022)   Exercise Vital Sign    Days of Exercise per Week: 2 days    Minutes of Exercise per Session: 20 min  Stress: No Stress Concern Present (10/22/2022)   [place]    Feeling of Stress : Not at all  Social Connections: Unknown (10/22/2022)   Social Connection and Isolation Panel [NHANES]    Frequency of Communication with Friends and Family: More than three times a week    Frequency of Social Gatherings with Friends and Family: Once a week    Attends Religious Services: Never    Marine scientist or Organizations: No    Attends Music therapist: Never    Marital Status: Not on file    Tobacco Counseling Counseling given: Not Answered   Clinical Intake:  Pre-visit preparation completed: Yes  Pain : No/denies pain     Diabetes: No  How often do you need to have someone help you when you read instructions, pamphlets, or other written materials from your doctor or pharmacy?: 1 - Never  Diabetic?  no  Interpreter Needed?: No  Information entered by :: Kelly Kennedy LPN   Activities of Daily Living    10/22/2022    8:40 AM 09/09/2022    8:23 AM  In your present state of health, do you have any difficulty performing the following activities:  Hearing? 0 0  Vision? 0 0  Difficulty concentrating or making decisions? 0 0  Walking or climbing stairs? 0 0  Dressing or bathing? 0 0  Doing errands, shopping? 0 0  Preparing Food and eating ? N   Using the Toilet? N   In the past six months, have you accidently leaked urine? N   Do you have problems with loss of bowel control? N   Managing your Medications? N   Managing your Finances? N   Housekeeping or managing your Housekeeping? N     Patient Care Team: Venita Lick, NP as PCP - General (Nurse  Practitioner) Madelin Rear, [facility] (Inactive) as Pharmacist (General Practice) Lloyd Huger, MD as Consulting Physician (Oncology)  Indicate any recent Medical Services you may have received from other than [facility] providers in the past year (date may be approximate).     Assessment:   This is a routine wellness examination for Kelly Poole.  Hearing/Vision screen Hearing Screening - Comments:: No trouble hearing Vision Screening - Comments:: Up to date Dr. Gloriann Loan  Dietary issues and exercise activities discussed: Current  Exercise Habits: Structured exercise class, Time (Minutes): 25, Frequency (Times/Week): 3, Weekly Exercise (Minutes/Week): 75, Intensity: Mild   Goals Addressed             This Visit's Progress    Weight (lb) < 200 lb (90.7 kg)         Depression Screen    10/22/2022    8:44 AM 09/09/2022    8:15 AM 06/16/2022    8:22 AM 03/25/2022    8:43 AM 10/20/2021   11:18 AM 09/08/2021    8:08 AM 07/02/2021    9:11 AM  PHQ 2/9 Scores  PHQ - 2 Score 0 0 0 0 0 0 0  PHQ- 9 Score '2 6 7 5       '$ Fall Risk    10/22/2022    8:35 AM 09/09/2022    8:15 AM 06/16/2022    8:21 AM 10/20/2021   11:17 AM 09/08/2021    9:13 AM  Fall Risk   Falls in the past year? 0 0 0 0 0  Number falls in past yr: 0 0 0  0  Injury with Fall? 0 0 0  0  Risk for fall due to :  No Fall Risks No Fall Risks Medication side effect No Fall Risks  Follow up Falls evaluation completed;Education provided;Falls prevention discussed Falls evaluation completed Falls evaluation completed Falls evaluation completed;Education provided;Falls prevention discussed Education provided    FALL RISK PREVENTION PERTAINING TO THE HOME:  Any stairs in or around the home? No  If so, are there any without handrails? No  Home free of loose throw rugs in walkways, pet beds, electrical cords, etc? Yes  Adequate lighting in your home to reduce risk of falls? Yes   ASSISTIVE DEVICES UTILIZED TO PREVENT FALLS:  Life alert?  No  Use of a cane, walker or w/c? No  Grab bars in the bathroom? No  Shower chair or bench in shower? No  Elevated toilet seat or a handicapped toilet? Yes   TIMED UP AND GO:  Was the test performed? No .    Cognitive Function:        10/22/2022    8:36 AM 10/20/2021   11:19 AM 10/14/2020    3:26 PM 10/12/2019    3:23 PM  6CIT Screen  What Year? 0 points 0 points 0 points 0 points  What month? 0 points 0 points 0 points 0 points  What time? 0 points 0 points 0 points 0 points  Count back from 20 2 points 2 points 0 points 0 points  Months in reverse 2 points 4 points 0 points 0 points  Repeat phrase 0 points 6 points 0 points 2 points  Total Score 4 points 12 points 0 points 2 points    Immunizations Immunization History  Administered Date(s) Administered   Moderna Sars-Covid-2 Vaccination 02/12/2020, 03/12/2020, 11/30/2020, 12/08/2020   Td 12/22/2003    TDAP status: Due, Education has been provided regarding the importance of this vaccine. Advised may receive this vaccine at local pharmacy or Health Dept. Aware to provide a copy of the vaccination record if obtained from local pharmacy or Health Dept. Verbalized acceptance and understanding.  Flu Vaccine status: Due, Education has been provided regarding the importance of this vaccine. Advised may receive this vaccine at local pharmacy or Health Dept. Aware to provide a copy of the vaccination record if obtained from local pharmacy or Health Dept. Verbalized acceptance and understanding.  Pneumococcal vaccine status: Due, Education has been  provided regarding the importance of this vaccine. Advised may receive this vaccine at local pharmacy or Health Dept. Aware to provide a copy of the vaccination record if obtained from local pharmacy or Health Dept. Verbalized acceptance and understanding.  Covid-19 vaccine status: Information provided on how to obtain vaccines.   Qualifies for Shingles Vaccine? Yes   Zostavax  completed No   Shingrix Completed?: No.    Education has been provided regarding the importance of this vaccine. Patient has been advised to call insurance company to determine out of pocket expense if they have not yet received this vaccine. Advised may also receive vaccine at local pharmacy or Health Dept. Verbalized acceptance and understanding.  Screening Tests Health Maintenance  Topic Date Due   COVID-19 Vaccine (5 - Moderna series) 11/07/2022 (Originally 02/02/2021)   Zoster Vaccines- Shingrix (1 of 2) 12/09/2022 (Originally 10/16/2003)   INFLUENZA VACCINE  03/21/2023 (Originally 07/21/2022)   TETANUS/TDAP  09/10/2023 (Originally 12/21/2013)   Pneumonia Vaccine 56+ Years old (1 - PCV) 10/23/2023 (Originally 10/15/2018)   COLONOSCOPY (Pts 45-63yr Insurance coverage will need to be confirmed)  01/16/2023   Medicare Annual Wellness (AWV)  10/23/2023   MAMMOGRAM  01/02/2024   DEXA SCAN  01/01/2027   Hepatitis C Screening  Completed   HPV VACCINES  Aged Out    Health Maintenance  There are no preventive care reminders to display for this patient.   Colorectal cancer screening: Type of screening: Colonoscopy. Completed 2014. Repeat every 10 years  Mammogram status: Completed 2023. Repeat every year  Bone Density status: Completed 2023. Results reflect: Bone density results: OSTEOPENIA. Repeat every   years.  Lung Cancer Screening: (Low Dose CT Chest recommended if Age 69-80years, 30 pack-year currently smoking OR have quit w/in 15years.) does not qualify.   Lung Cancer Screening Referral:   Additional Screening:  Hepatitis C Screening: does not qualify; Completed 2018  Vision Screening: Recommended annual ophthalmology exams for early detection of glaucoma and other disorders of the eye. Is the patient up to date with their annual eye exam?  Yes  Who is the provider or what is the name of the office in which the patient attends annual eye exams? [facility] If pt is not established  with a provider, would they like to be referred to a provider to establish care? No .   Dental Screening: Recommended annual dental exams for proper oral hygiene  Community Resource Referral / Chronic Care Management: CRR required this visit?  No   CCM required this visit?  No      Plan:     I have personally reviewed and noted the following in the patient's chart:   Medical and social history Use of alcohol, tobacco or illicit drugs  Current medications and supplements including opioid prescriptions. Patient is not currently taking opioid prescriptions. Functional ability and status Nutritional status Physical activity Advanced directives List of other physicians Hospitalizations, surgeries, and ER visits in previous 12 months Vitals Screenings to include cognitive, depression, and falls Referrals and appointments  In addition, I have reviewed and discussed with patient certain preventive protocols, quality metrics, and best practice recommendations. A written personalized care plan for preventive services as well as general preventive health recommendations were provided to patient.     JLeroy Kennedy LPN   118/04/[id number]  Nurse Notes:

## 2022-10-22 NOTE — Telephone Encounter (Signed)
Please disregard. Opened in error    [place], [place] Management  [phone] [address], [place], [place] [address] Phone: [phone] Email: [email or web address]

## 2022-10-22 NOTE — Telephone Encounter (Signed)
   Telephone encounter was:  Unsuccessful.  10/22/2022 Name: Kelly Poole MRN: [id number] DOB: [birth year]  Unsuccessful outbound call made today to assist with:   Utilities  Outreach Attempt:  1st Attempt  A HIPAA compliant voice message was left requesting a return call.  Instructed patient to call back    [place], [place] Management  [phone] [address], [place], [place] [address] Phone: [phone] Email: [email or web address]

## 2022-10-22 NOTE — Patient Instructions (Signed)
Kelly Poole , Thank you for taking time to come for your Medicare Wellness Visit. I appreciate your ongoing commitment to your health goals. Please review the following plan we discussed and let me know if I can assist you in the future.   These are the goals we discussed:  Goals      PharmD Manage my diet     Timeframe:  Long-Range Goal Priority:  High Start Date:                             Expected End Date:                       Follow Up Date 3 months   - ask for help if I have trouble affording healthy foods - choose foods low in fat and sugar - choose foods that are low in sodium (salt) - eat 3 to 5 servings of fruits and vegetables each day - prepare or eat main meal at home 3 to 5 days each week - keep healthy snacks on hand - read food labels for sodium (salt), fat and sugar content - take my supplements as prescribed - watch for swelling in feet, ankles and legs every day    Why is this important?   A healthy diet is important for mental and physical health.  Healthy food helps repair damaged body tissue and maintains strong bones and muscles.  No single food is just right so eating a variety of proteins, fruits, vegetables and grains is best.  You may need to change what you eat or drink to manage kidney disease.  A dietitian is the best person to guide you.     Notes:      Weight (lb) < 200 lb (90.7 kg)        This is a list of the screening recommended for you and due dates:  Health Maintenance  Topic Date Due   COVID-19 Vaccine (5 - Moderna series) 11/07/2022*   Zoster (Shingles) Vaccine (1 of 2) 12/09/2022*   Flu Shot  03/21/2023*   Tetanus Vaccine  09/10/2023*   Pneumonia Vaccine (1 - PCV) 10/23/2023*   Colon Cancer Screening  01/16/2023   Medicare Annual Wellness Visit  10/23/2023   Mammogram  01/02/2024   DEXA scan (bone density measurement)  01/01/2027   Hepatitis C Screening: USPSTF Recommendation to screen - Ages 18-79 yo.  Completed   HPV  Vaccine  Aged Out  *Topic was postponed. The date shown is not the original due date.    Advanced directives: Education provided  Conditions/risks identified:   Next appointment: Follow up in one year for your annual wellness visit 03-09-2022 @ 8:00  [place] 65 Years and Older, Female Preventive care refers to lifestyle choices and visits with your health care provider that can promote health and wellness. What does preventive care include? A yearly physical exam. This is also called an annual well check. Dental exams once or twice a year. Routine eye exams. Ask your health care provider how often you should have your eyes checked. Personal lifestyle choices, including: Daily care of your teeth and gums. Regular physical activity. Eating a healthy diet. Avoiding tobacco and drug use. Limiting alcohol use. Practicing safe sex. Taking low-dose aspirin every day. Taking vitamin and mineral supplements as recommended by your health care provider. What happens during an annual well check? The services and  screenings done by your health care provider during your annual well check will depend on your age, overall health, lifestyle risk factors, and family history of disease. Counseling  Your health care provider may ask you questions about your: Alcohol use. Tobacco use. Drug use. Emotional well-being. Home and relationship well-being. Sexual activity. Eating habits. History of falls. Memory and ability to understand (cognition). Work and work Statistician. Reproductive health. Screening  You may have the following tests or measurements: Height, weight, and BMI. Blood pressure. Lipid and cholesterol levels. These may be checked every 5 years, or more frequently if you are over 56 years old. Skin check. Lung cancer screening. You may have this screening every year starting at age 67 if you have a 30-pack-year history of smoking and currently smoke or have quit  within the past 15 years. Fecal occult blood test (FOBT) of the stool. You may have this test every year starting at age 55. Flexible sigmoidoscopy or colonoscopy. You may have a sigmoidoscopy every 5 years or a colonoscopy every 10 years starting at age 42. Hepatitis C blood test. Hepatitis B blood test. Sexually transmitted disease (STD) testing. Diabetes screening. This is done by checking your blood sugar (glucose) after you have not eaten for a while (fasting). You may have this done every 1-3 years. Bone density scan. This is done to screen for osteoporosis. You may have this done starting at age 1. Mammogram. This may be done every 1-2 years. Talk to your health care provider about how often you should have regular mammograms. Talk with your health care provider about your test results, treatment options, and if necessary, the need for more tests. Vaccines  Your health care provider may recommend certain vaccines, such as: Influenza vaccine. This is recommended every year. Tetanus, diphtheria, and acellular pertussis (Tdap, Td) vaccine. You may need a Td booster every 10 years. Zoster vaccine. You may need this after age 62. Pneumococcal 13-valent conjugate (PCV13) vaccine. One dose is recommended after age 34. Pneumococcal polysaccharide (PPSV23) vaccine. One dose is recommended after age 78. Talk to your health care provider about which screenings and vaccines you need and how often you need them. This information is not intended to replace advice given to you by your health care provider. Make sure you discuss any questions you have with your health care provider. Document Released: 01/03/2016 Document Revised: 08/26/2016 Document Reviewed: 10/08/2015 Elsevier Interactive Patient Education  2017 [place] Prevention in the Home Falls can cause injuries. They can happen to people of all ages. There are many things you can do to make your home safe and to help prevent  falls. What can I do on the outside of my home? Regularly fix the edges of walkways and driveways and fix any cracks. Remove anything that might make you trip as you walk through a door, such as a raised step or threshold. Trim any bushes or trees on the path to your home. Use bright outdoor lighting. Clear any walking paths of anything that might make someone trip, such as rocks or tools. Regularly check to see if handrails are loose or broken. Make sure that both sides of any steps have handrails. Any raised decks and porches should have guardrails on the edges. Have any leaves, snow, or ice cleared regularly. Use sand or salt on walking paths during winter. Clean up any spills in your garage right away. This includes oil or grease spills. What can I do in the bathroom? Use night  lights. Install grab bars by the toilet and in the tub and shower. Do not use towel bars as grab bars. Use non-skid mats or decals in the tub or shower. If you need to sit down in the shower, use a plastic, non-slip stool. Keep the floor dry. Clean up any water that spills on the floor as soon as it happens. Remove soap buildup in the tub or shower regularly. Attach bath mats securely with double-sided non-slip rug tape. Do not have throw rugs and other things on the floor that can make you trip. What can I do in the bedroom? Use night lights. Make sure that you have a light by your bed that is easy to reach. Do not use any sheets or blankets that are too big for your bed. They should not hang down onto the floor. Have a firm chair that has side arms. You can use this for support while you get dressed. Do not have throw rugs and other things on the floor that can make you trip. What can I do in the kitchen? Clean up any spills right away. Avoid walking on wet floors. Keep items that you use a lot in easy-to-reach places. If you need to reach something above you, use a strong step stool that has a grab  bar. Keep electrical cords out of the way. Do not use floor polish or wax that makes floors slippery. If you must use wax, use non-skid floor wax. Do not have throw rugs and other things on the floor that can make you trip. What can I do with my stairs? Do not leave any items on the stairs. Make sure that there are handrails on both sides of the stairs and use them. Fix handrails that are broken or loose. Make sure that handrails are as long as the stairways. Check any carpeting to make sure that it is firmly attached to the stairs. Fix any carpet that is loose or worn. Avoid having throw rugs at the top or bottom of the stairs. If you do have throw rugs, attach them to the floor with carpet tape. Make sure that you have a light switch at the top of the stairs and the bottom of the stairs. If you do not have them, ask someone to add them for you. What else can I do to help prevent falls? Wear shoes that: Do not have high heels. Have rubber bottoms. Are comfortable and fit you well. Are closed at the toe. Do not wear sandals. If you use a stepladder: Make sure that it is fully opened. Do not climb a closed stepladder. Make sure that both sides of the stepladder are locked into place. Ask someone to hold it for you, if possible. Clearly mark and make sure that you can see: Any grab bars or handrails. First and last steps. Where the edge of each step is. Use tools that help you move around (mobility aids) if they are needed. These include: Canes. Walkers. Scooters. Crutches. Turn on the lights when you go into a dark area. Replace any light bulbs as soon as they burn out. Set up your furniture so you have a clear path. Avoid moving your furniture around. If any of your floors are uneven, fix them. If there are any pets around you, be aware of where they are. Review your medicines with your doctor. Some medicines can make you feel dizzy. This can increase your chance of falling. Ask  your doctor what other things that  you can do to help prevent falls. This information is not intended to replace advice given to you by your health care provider. Make sure you discuss any questions you have with your health care provider. Document Released: 10/03/2009 Document Revised: 05/14/2016 Document Reviewed: 01/11/2015 Elsevier Interactive Patient Education  2017 [employer].

## 2022-10-23 ENCOUNTER — Telehealth: Payer: Self-pay

## 2022-10-23 NOTE — Telephone Encounter (Signed)
   Telephone encounter was:  Successful.  10/23/2022 Name: Kelly Poole MRN: [id number] DOB: August 19, 1951  Kelly Poole is a 71 y.o. year old female who is a primary care patient of Cannady, Barbaraann Faster, NP . The community resource team was consulted for assistance with Financial Difficulties related to financial strain  Care guide performed the following interventions: Patient provided with information about care guide support team and interviewed to confirm resource needs.Gave patient resource numbers for [employer] as well as other utility assistance resources over the phone   Follow Up Plan:  No further follow up planned at this time. The patient has been provided with needed resources.    [place], Care Management  [phone] [address], [place], [place] [address] Phone: [phone] Email: [email or web address]

## 2022-10-23 NOTE — Telephone Encounter (Signed)
   Telephone encounter was:  Unsuccessful.  10/23/2022 Name: DINARA LUPU MRN: [id number] DOB: Jun 27, 1951  Unsuccessful outbound call made today to assist with:  Financial Difficulties related to financial strain  Outreach Attempt:  2nd Attempt Follow up call  A HIPAA compliant voice message was left requesting a return call.  Instructed patient to call back    [place], [place] [facility]  [phone] [address], [place], [place] [address] Phone: [phone] Email: [email or web address]

## 2022-11-03 ENCOUNTER — Encounter: Payer: Self-pay | Admitting: Nurse Practitioner

## 2022-11-23 ENCOUNTER — Ambulatory Visit (INDEPENDENT_AMBULATORY_CARE_PROVIDER_SITE_OTHER): Payer: Medicare HMO

## 2022-11-23 NOTE — Progress Notes (Signed)
Chronic Care Management Pharmacy Note  11/23/2022 Name:  Kelly Poole MRN:  [id number] DOB:  [birth year]  Summary: Pleasant 71 year old female presents for f/u CCM visit. She states she loves to go shopping and watch TC ([employer]). Has 2 kids, 5 grandkids, and 2 great grandkids  Summary: None  Subjective: Kelly Poole is an 71 y.o. year old female who is a primary patient of Cannady, Barbaraann Faster, NP.  The CCM team was consulted for assistance with disease management and care coordination needs.    Engaged with patient by telephone for follow up visit in response to provider referral for pharmacy case management and/or care coordination services.   Consent to Services:  The patient was given information about Chronic Care Management services, agreed to services, and gave verbal consent prior to initiation of services.  Please see initial visit note for detailed documentation.   Patient Care Team: Venita Lick, NP as PCP - General (Nurse Practitioner) Lloyd Huger, MD as Consulting Physician (Oncology) Lane Hacker, [facility] (Pharmacist)  Recent office visits:  06/16/22 Kathrine Haddock, NP (Rash, insect bite) Orders: none; Medication changes: Betamethasone Dipropionate 0.05%   Recent consult visits:  None since the last coordination call on 06/12/22 with [facility] visits:  None since the last coordination call on 06/12/22 with CCM RN  Objective:  Lab Results  Component Value Date   CREATININE 1.35 (H) 09/09/2022   CREATININE 1.21 (H) 03/25/2022   CREATININE 1.34 (H) 09/08/2021    No results found for: "HGBA1C" Last diabetic Eye exam: No results found for: "HMDIABEYEEXA"  Last diabetic Foot exam: No results found for: "HMDIABFOOTEX"      Component Value Date/Time   CHOL 187 09/09/2022 0833   CHOL 187 03/25/2022 0905   CHOL 200 (H) 09/08/2021 0816   CHOL 178 06/03/2015 1127   TRIG 97 09/09/2022 0833   TRIG 105 03/25/2022 0905   TRIG 97  09/08/2021 0816   TRIG 93 06/03/2015 1127   HDL 51 09/09/2022 0833   HDL 60 03/25/2022 0905   HDL 52 09/08/2021 0816   CHOLHDL 2.8 08/23/2018 1635   VLDL 19 06/03/2015 1127   LDLCALC 118 (H) 09/09/2022 0833   LDLCALC 108 (H) 03/25/2022 0905   LDLCALC 131 (H) 09/08/2021 0816       Latest Ref Rng & Units 09/09/2022    8:33 AM 03/25/2022    9:05 AM 09/08/2021    8:16 AM  Hepatic Function  Total Protein 6.0 - 8.5 g/dL 7.2  7.5  7.3   Albumin 3.9 - 4.9 g/dL 4.8  4.8  4.7   AST 0 - 40 IU/L _0 ALT 0 - 32 IU/L _1 Alk Phosphatase 44 - 121 IU/L 113  122  92   Total Bilirubin 0.0 - 1.2 mg/dL 0.3  0.3  0.3     Lab Results  Component Value Date/Time   TSH 3.760 09/09/2022 08:33 AM   TSH 3.170 03/25/2022 09:05 AM   FREET4 1.21 09/09/2022 08:33 AM   FREET4 1.30 03/25/2022 09:05 AM       Latest Ref Rng & Units 09/09/2022    8:33 AM 05/26/2022    8:11 AM 03/25/2022    9:05 AM  CBC  WBC 3.4 - 10.8 x10E3/uL 6.4  6.7  5.5   Hemoglobin 11.1 - 15.9 g/dL 10.8  10.6  10.8   Hematocrit 34.0 -  46.6 % 33.6  32.4  32.4   Platelets 150 - 450 x10E3/uL 292  267  268     Lab Results  Component Value Date/Time   VD25OH 50.4 09/09/2022 08:33 AM   VD25OH 60.5 03/25/2022 09:05 AM    Clinical ASCVD:  The 10-year ASCVD risk score (Arnett DK, et al., 2019) is: 11%   Values used to calculate the score:     Age: 5 years     Sex: Female     Is Non-Hispanic African American: Yes     Diabetic: No     Tobacco smoker: No     Systolic Blood Pressure: 782 mmHg     Is BP treated: Yes     HDL Cholesterol: 51 mg/dL     Total Cholesterol: 187 mg/dL   Social History   Tobacco Use  Smoking Status Never  Smokeless Tobacco Never   BP Readings from Last 3 Encounters:  09/09/22 126/79  06/16/22 (!) 150/89  06/02/22 124/86   Pulse Readings from Last 3 Encounters:  09/09/22 65  06/16/22 71  06/02/22 77   Wt Readings from Last 3 Encounters:  09/09/22 152 lb (68.9 kg)  06/16/22 152 lb  (68.9 kg)  06/02/22 150 lb (68 kg)   BMI Readings from Last 3 Encounters:  09/09/22 35.33 kg/m  06/16/22 34.10 kg/m  06/02/22 33.63 kg/m    Assessment: Review of patient past medical history, allergies, medications, health status, including review of consultants reports, laboratory and other test data, was performed as part of comprehensive evaluation and provision of chronic care management services.   SDOH:  (Social Determinants of Health) assessments and interventions performed: Yes SDOH Interventions    Flowsheet Row Chronic Care Management from 11/23/2022 in [facility] from 10/22/2022 in [facility] from 03/23/2022 in [facility] from 09/17/2021 in [facility] from 04/19/2020 in [place] Interventions       Food Insecurity Interventions -- -- -- Other (Comment)  [has received help from charity care before. Asking for assistance with charity care/food voucher] --  Housing Interventions -- Intervention Not Indicated -- Intervention Not Indicated --  Transportation Interventions Intervention Not Indicated Intervention Not Indicated Intervention Not Indicated Intervention Not Indicated --  Utilities Interventions -- Ambulatory [id number] Order -- -- --  Alcohol Usage Interventions -- Intervention Not Indicated (Score <7) -- -- --  Financial Strain Interventions -- Intervention Not Indicated -- Other (Comment)  [referral to care guide for assistance] --  Physical Activity Interventions -- Intervention Not Indicated -- Intervention Not Indicated --  Stress Interventions -- Intervention Not Indicated -- -- --  Social Connections Interventions -- Intervention Not Indicated -- Intervention Not Indicated Other (Comment)  [The patient has a good support system]       CCM Care Plan  Allergies  Allergen Reactions   Macrobid  [Nitrofurantoin Macrocrystal] Other (See Comments)    Sore throat and irritation to throat    Medications Reviewed Today     Reviewed by Lane Hacker, [facility] (Pharmacist) on 11/23/22 at [place] List Status: <None>   Medication Order Taking? Sig Documenting Provider Last Dose Status Informant  Ascorbic Acid (VITAMIN C) 1000 MG tablet [id number] No Take 1,000 mg by mouth daily. [provider] Taking Active Pharmacy Records  aspirin 81 MG tablet [id number] No Take 81 mg by mouth daily. [provider] Taking Active Pharmacy Records  betamethasone  dipropionate 0.05 % cream [id number] No Apply topically 2 (two) times daily.  Patient not taking: Reported on 10/22/2022   Kathrine Haddock, NP Not Taking Active   Cholecalciferol 50 MCG (2000 UT) TABS [id number] No Take 1 tablet by mouth daily. [provider] Taking Active   FEROSUL 325 (65 Fe) MG tablet [id number] No TAKE 1 TABLET EVERY DAY WITH BREAKFAST Cannady, Jolene T, NP Taking Active   Garlic 3903 MG CAPS [id number] No Take 1,000 mg by mouth daily. [provider] Taking Active            Med Note (HILL, TIFFANY A   Thu Oct 12, 2019  3:09 PM) Doesn't take regular  lamoTRIgine (LAMICTAL) 25 MG tablet [id number] No Take 1 tablet by mouth at bedtime. [provider] Taking Expired 09/09/22 2359            Med Note (POTTS, JACOB   Mon Mar 23, 2022 10:20 AM) Lamictal 25 mg once daily at night now   losartan (COZAAR) 100 MG tablet [id number] No TAKE 1 TABLET EVERY DAY Cannady, Jolene T, NP Taking Active   Multiple Vitamin (MULTIVITAMINS PO) [id number] No Take by mouth daily. [provider] Taking Active Pharmacy Records  omeprazole (PRILOSEC) 20 MG capsule [id number] No Take 1 capsule (20 mg total) by mouth daily. Marnee Guarneri T, NP Taking Active   rosuvastatin (CRESTOR) 40 MG tablet [id number] No Take 1 tablet (40 mg total) by mouth daily. Marnee Guarneri T, NP Taking Active   vitamin E 100 UNIT  capsule [id number] No Take 100 Units by mouth daily. [provider] Taking Active Pharmacy Records            Patient Active Problem List   Diagnosis Date Noted   Osteopenia of lumbar spine 01/01/2022   Persistent proteinuria 09/08/2021   Secondary hyperparathyroidism ([place]) 12/23/2020   Seizure ([place]) 12/23/2020   Anemia 07/30/2020   Osteoarthritis of right knee 09/07/2019   CKD (chronic kidney disease) stage 3, GFR 30-59 ml/min (HCC) 10/05/2016   Vitamin B12 deficiency (dietary) anemia 12/27/2015   Hypertension 12/27/2015   Insomnia 06/03/2015   Thyroid goiter 06/03/2015   HX: breast cancer 06/03/2015   Gastroesophageal reflux disease 06/03/2015   Hyperlipidemia 06/03/2015    Immunization History  Administered Date(s) Administered   Moderna Sars-Covid-2 Vaccination 02/12/2020, 03/12/2020, 11/30/2020, 12/08/2020   Td 12/22/2003    Conditions to be addressed/monitored: HLD HTN Insomnia   Care Plan : [place]  Updates made by Lane Hacker, [place] since 11/23/2022 12:00 AM     Problem: HTN, CKD, Anemia, secondary hyperparpthyroidiism, HLD, seizure   Priority: High     Long-Range Goal: disease management   Start Date: 03/23/2022  Recent Progress: On track  Priority: High  Note:   Current Barriers:  Does not contact provider office for questions/concerns  Pharmacist Clinical Goal(s):  Patient will verbalize ability to afford treatment regimen through collaboration with PharmD and provider.   Interventions: 1:1 collaboration with Venita Lick, NP regarding development and update of comprehensive plan of care as evidenced by provider attestation and co-signature Inter-disciplinary care team collaboration (see longitudinal plan of care) Comprehensive medication review performed; medication list updated in electronic medical record  Hypertension (BP goal <140/90) BP Readings from Last 3 Encounters:  09/09/22 126/79  06/16/22 (!) 150/89   06/02/22 124/86  -Controlled -Current treatment: Losartan 17m QD Appropriate, Effective, Safe, Accessible -Medications previously tried: Amlodipine ([place])  -Current home readings: Doesn't test -Current dietary habits: "  Tries to eat healthy" -Current exercise habits: None -Denies hypotensive/hypertensive symptoms -Educated on BP goals and benefits of medications for prevention of heart attack, stroke and kidney damage; -Counseled to monitor BP at home daily, document, and provide log at future appointments -Recommended to continue current medication  Hyperlipidemia: (LDL goal < 100) The 10-year ASCVD risk score (Arnett DK, et al., 2019) is: 11%   Values used to calculate the score:     Age: 10 years     Sex: Female     Is Non-Hispanic African American: Yes     Diabetic: No     Tobacco smoker: No     Systolic Blood Pressure: 321 mmHg     Is BP treated: Yes     HDL Cholesterol: 51 mg/dL     Total Cholesterol: 187 mg/dL Lab Results  Component Value Date   CHOL 187 09/09/2022   CHOL 187 03/25/2022   CHOL 200 (H) 09/08/2021   Lab Results  Component Value Date   HDL 51 09/09/2022   HDL 60 03/25/2022   HDL 52 09/08/2021   Lab Results  Component Value Date   LDLCALC 118 (H) 09/09/2022   LDLCALC 108 (H) 03/25/2022   LDLCALC 131 (H) 09/08/2021   Lab Results  Component Value Date   TRIG 97 09/09/2022   TRIG 105 03/25/2022   TRIG 97 09/08/2021   Lab Results  Component Value Date   CHOLHDL 2.8 08/23/2018  No results found for: "LDLDIRECT" Last vitamin D Lab Results  Component Value Date   VD25OH 50.4 09/09/2022   Lab Results  Component Value Date   TSH 3.760 09/09/2022    -Controlled -Current treatment: Rosuvastatin 44m Appropriate, Effective, Safe, Accessible Started 09/09/22 -Medications previously tried: Atoravastatin (Started on 06/27/22 but stopped due to being ineffective) -Current dietary patterns: "Tries to eat healthy" -Current exercise habits:  None -Educated on Cholesterol goals;  December 2023: Patient states she is compliant on meds. However, she did state a few times she hates taking all these pills. If her Cholesterol remains high at next labs, could indicate non-compliance  Seizure (Goal: Decrease frequency) -Controlled -Current treatment  Lamotrigine 293mHS Appropriate, Effective, Safe, Accessible -Medications previously tried: N/A  December 2023: Per patient, last seizure was a year ago. Happened because her BP was elevated and couldn't get her BP meds. Gave patient my number to call if this happens again  Patient Goals/Self-Care Activities Patient will:  - take medications as prescribed as evidenced by patient report and record review  Follow Up Plan: The patient has been provided with contact information for the care management team and has been advised to call with any health related questions or concerns.          Future Appointments  Date Time Provider De[place]3/21/2024  9:00 AM CaVenita LickNP [facility]   [facility] - [phone]

## 2022-11-23 NOTE — Patient Instructions (Signed)
Visit Information   Goals Addressed   None    Patient Care Plan: RNCM: Chronic Kidney (Adult)  Completed 01/23/2022   Problem Identified: RNCM: Adjustment to Chronic Kidney Disease Resolved 01/23/2022  Priority: Medium     Long-Range Goal: RNCM: CKD3 Management Completed 01/23/2022  Start Date: 02/07/2021  Expected End Date: 06/10/2022  Recent Progress: On track  Priority: Medium  Note:   Current Barriers: Resolving, duplicate goal Knowledge Deficits related to resources in the community for management of CKD3 and chronic conditions  Chronic Disease Management support and education needs related to effective management of CKD3 Lacks caregiver support.  Non-adherence to prescribed medication regimen Does not adhere to prescribed medication regimen Lacks social connections Does not contact provider office for questions/concerns  Nurse Case Manager Clinical Goal(s):  patient will verbalize understanding of plan for effective management of CKD3 patient will work with [facility] and pcp  to address needs related to CKD3 patient will attend all scheduled medical appointments: 03-09-2022, has upcoming appointments with specialist  patient will demonstrate improved health management independence as evidenced bylabs stable with no decline, adherence to dietary restrictions, adherence to medications, and working with the CCM team to optimize health and well being   Interventions:  1:1 collaboration with Venita Lick, NP regarding development and update of comprehensive plan of care as evidenced by provider attestation and co-signature Inter-disciplinary care team collaboration (see longitudinal plan of care) Evaluation of current treatment plan related to CKD3 and patient's adherence to plan as established by provider. 03-28-2021: The patient saw the pcp on 3-7 and had blood work. Hydrochlorothiazide discontinued after lab work. The patient continues to see kidney specialist on a regular basis. The  patient is following a renal diet and monitoring her food content. Will continue to monitor. 06-18-2021: Patient continues to be managing well with established CKD plan. 09-17-2021: The patient saw pcp on 08-22-2021 and had blood work. Her kidney function is stable. She denies any new issues with her CKD and is happy her labs are stable. She is staying hydrated and monitoring her dietary intake well. Will continue to monitor for changes.  Advised patient to call the office for questions or concerns Provided education to patient re: following heart healthy renal diet. 03-28-2021: Is eating well. Denies any issues with dietary restrictions. 06-18-2021: Patient eating well and states she eats a lot of fruits and vegetables. 09-17-2021: The patient eats well and denies any issues with dietary restrictions. She did ask for another food voucher and a care guide referral has been placed. She has received assistance from the charity care before. Will continue to monitor for changes.  Reviewed medications with patient and discussed compliance. The patient states she has her medications and is taking as prescribed.  09-17-2021:  Patient continues to be compliant with all her medications. Reviewed scheduled/upcoming provider appointments including: 03-09-2021 with the pcp  Discussed plans with patient for ongoing care management follow up and provided patient with direct contact information for care management team  Patient Goals/Self-Care Activities patient will:  - Patient will self administer medications as prescribed Patient will attend all scheduled provider appointments Patient will call pharmacy for medication refills Patient will attend church or other social activities Patient will continue to perform ADL's independently Patient will continue to perform IADL's independently Patient will call provider office for new concerns or questions Patient will work with BSW to address care coordination needs and will  continue to work with the clinical team to address health care and  disease management related needs.   - decision-making supported - depression screen reviewed - goal setting facilitated - positive reinforcement provided - problem-solving facilitated - relaxation techniques promoted - self-care encouraged - self-reflection promoted - verbalization of feelings encouraged  Follow Up Plan: Telephone follow up appointment with care management team member scheduled for: 12-03-2021 at 0900 am        Task: RNCM: Support Psychological Response to Chronic Kidney Disease Completed 09/17/2021  Outcome: Positive  Note:   Care Management Activities:    - decision-making supported - depression screen reviewed - goal setting facilitated - positive reinforcement provided - problem-solving facilitated - relaxation techniques promoted - self-care encouraged - self-reflection promoted - verbalization of feelings encouraged        Problem Identified: Palliative Care Resolved 01/23/2022     Patient Care Plan: RNCM: Hypertension (Adult)  Completed 01/23/2022   Problem Identified: RNCM: Hypertension (Hypertension) Resolved 01/23/2022  Priority: Medium     Long-Range Goal: RNCM: Hypertension Monitored Completed 01/23/2022  Start Date: 02/07/2021  Expected End Date: 06/09/2022  Recent Progress: On track  Priority: Medium  Note:   Objective: Resolving, duplicate goal Last practice recorded BP readings:  BP Readings from Last 3 Encounters:  09/08/21 (!) 147/79  07/07/21 118/78  07/02/21 (!) 166/89     Most recent eGFR/CrCl: No results found for: EGFR  No components found for: CRCL Current Barriers:  Knowledge Deficits related to basic understanding of hypertension pathophysiology and self care management Knowledge Deficits related to understanding of medications prescribed for management of hypertension Non-adherence to prescribed medication regimen- was without her medications for HTN in  January x 1 week  Limited Social Support Unable to self administer medications as prescribed Does not adhere to prescribed medication regimen Lacks social connections  Case Manager Clinical Goal(s):   patient will verbalize understanding of plan for hypertension management  patient will attend all scheduled medical appointments: 03-09-2022  patient will demonstrate improved adherence to prescribed treatment plan for hypertension as evidenced by taking all medications as prescribed, monitoring and recording blood pressure as directed, adhering to low sodium/DASH diet  patient will demonstrate improved health management independence as evidenced by checking blood pressure as directed and notifying PCP if SBP>160 or DBP > 90, taking all medications as prescribe, and adhering to a low sodium diet as discussed.  patient will verbalize basic understanding of hypertension disease process and self health management plan as evidenced by compliance with medications, dietary restrictions, and working with the CCM team to optimize health and well being  Interventions:  Collaboration with Venita Lick, NP regarding development and update of comprehensive plan of care as evidenced by provider attestation and co-signature Inter-disciplinary care team collaboration (see longitudinal plan of care) Evaluation of current treatment plan related to hypertension self management and patient's adherence to plan as established by provider. 03-28-2021: Saw the provider recently and got a good report. She was taken off of HCTZ and is doing well. States blood pressure is up and down depending on her activity level but has not reached 644 systolic. Review of normal parameters. 09-17-2021:  Patient monitoring blood pressure at home, denies any variations from normal. The patient will continue to monitor and call for any changes in condition. States compliance with medications and dietary restrictions.  Provided education to  patient re: stroke prevention, s/s of heart attack and stroke, DASH diet, complications of uncontrolled blood pressure. 09-17-2021: Review with the patient.  Reviewed medications with patient and discussed importance of compliance.  03-28-2021: The patient reports compliance with medications. 09-17-2021: Patient continues to be compliant with all medications. Discussed plans with patient for ongoing care management follow up and provided patient with direct contact information for care management team Advised patient, providing education and rationale, to monitor blood pressure daily and record, calling PCP for findings outside established parameters.  Reviewed scheduled/upcoming provider appointments including:  03-09-2022 Patient Goals/Self-Care Activities  patient will:  - Self administers medications as prescribed Attends all scheduled provider appointments Calls provider office for new concerns, questions, or BP outside discussed parameters Checks BP and records as discussed- 09-17-2021: Endorses checking blood pressure regularly  Follows a low sodium diet/DASH diet - blood pressure trends reviewed - depression screen reviewed - home or ambulatory blood pressure monitoring encouraged Follow Up Plan: Telephone follow up appointment with care management team member scheduled for: 12-03-2021 at 0900 am    Task: RNCM: Identify and Monitor Blood Pressure Elevation Completed 09/17/2021  Outcome: Positive  Note:   Care Management Activities:    - blood pressure trends reviewed - depression screen reviewed - home or ambulatory blood pressure monitoring encouraged        Patient Care Plan: RNCM: Seizures- new onset  Completed 01/23/2022   Problem Identified: RNCM: Health Promotion or Disease Self-Management (General Plan of Care) Resolved 01/23/2022  Priority: High     Long-Range Goal: RNCM: Seizures management Completed 01/23/2022  Start Date: 02/07/2021  Expected End Date: 09/17/2022  Recent  Progress: On track  Priority: High  Note:   Current Barriers: Resolving, duplicate goal Knowledge Deficits related to resources available for help with needs post seizure activity  Chronic Disease Management support and education needs related to new onset of witnessed seizure in January 2022 Lacks caregiver support.  Transportation barriers- patient cannot drive at this time, says she has adequate transportation, knows there are resources available if needed for transportation. Lacks social connections Does not contact provider office for questions/concerns  Nurse Case Manager Clinical Goal(s):  patient will verbalize understanding of plan for effective management of seizures  patient will work with RNCM, pcp, and specialist  to address needs related to seizures  patient will attend all scheduled medical appointments: 03-09-2022 with the pcp, is calling to get an appointment for follow up with the neurologist because current medication she is taking is making her hair fall out.  patient will demonstrate improved adherence to prescribed treatment plan for seizures as evidenced bymedication compliance, no new seizure activity, and working with the CCM team to manage health and well being for chronic conditions  Interventions:  1:1 collaboration with Venita Lick, NP regarding development and update of comprehensive plan of care as evidenced by provider attestation and co-signature Inter-disciplinary care team collaboration (see longitudinal plan of care) Evaluation of current treatment plan related to seizures  and patient's adherence to plan as established by provider. 03-28-2021: The patient is doing well and denies any new seizures. She states the medications she is taking are keeping her appetite good. She does not see the specialist again until September. She is hopeful that she will not have any more seizures so she can go back to driving after July 3.  Repeat MRI shows the spot seen on  her brain is gone per the patient.  Will continue to monitor. 09-17-2021:  Patient reports no further seizures since January 2022. She does state the Lamictal she is taking is making her hair fall out. She states she had to change from the other medication they  had her on because it was causing her to have suicidal thoughts and it really scared her. Encouraged the patient to call the neurologist for a follow up appointment and to discuss treatment options.  Advised patient to call the office for changes or questions Provided education to patient re: taking medications as ordered, calling when medications are not there, adherence to prescribed diet, monitoring for new onset of seizure activity, driving restrictions. 03-28-2021: Review of driving restrictions, taking medications as prescribed and not missing doses, adequate sleep and rest, pacing activities. The patient has returned to the gym and feels great. Denies any issues with transportation at this time. 09-17-2021: Doing well and active. Does want to discuss medication changes with neurologist due to the Lamictal causing her hair to fall out. She denies any acute distress but states she does need to address this with her provider. Will continue to monitor.   Reviewed medications with patient and discussed compliance, the patient is taking Keppra and no issues noted at this time.  03-28-2021: The patient states compliance with medications regimen. 06-18-2021:  Patient remains on Keppra 533m 1 tab BID and is compliant with all medications. 09-17-2021: The patient was [place] off of the [place] and suicidal thoughts. The patient states the medications made  her feel like she was "crazy". She is on Lamictal now but it is causing her hair to fall out. Encouraged the patient to call and get an appointment for follow up with the neurologist.  Reviewed scheduled/upcoming provider appointments including: 03-09-2022 with pcp, is calling to  get a sooner appointment with the neurologist  Discussed plans with patient for ongoing care management follow up and provided patient with direct contact information for care management team  Patient Goals/Self-Care Activities  patient will:  - Patient will self administer medications as prescribed Patient will attend all scheduled provider appointments Patient will call pharmacy for medication refills Patient will attend church or other social activities Patient will continue to perform ADL's independently Patient will continue to perform IADL's independently Patient will call provider office for new concerns or questions Patient will work with BSW to address care coordination needs and will continue to work with the clinical team to address health care and disease management related needs.   - barriers to meeting goals identified - change-talk evoked - choices provided - collaboration with team encouraged - decision-making supported - difficulty of making life-long changes acknowledged - health risks reviewed - problem-solving facilitated - questions answered - readiness for change evaluated - reassurance provided - resources needed to meet goals identified. 03-28-2021: The patient did inquire about food resources. The patient has received gift cards x 2 within the last 6 months for food and the [employer] Explained limitations and the request previously for food resources. The patient verbalized that she would wait to talk to the care guides about food resources that she had what she needed at this time. Will continue to monitor. 09-17-2021: The patient ask about food resources and a voucher on today's call. The patient advised that a care guide referral will be placed. The patient has utilized the resources several times in the past. She is aware this may not be an option for her but is interested in food resources. Will continue to monitor for changes.  - self-reflection  promoted - self-reliance encouraged - verbalization of feelings encouraged  Follow Up Plan: Telephone follow up appointment with care management team member scheduled for: 12-03-2021 at 0900 am  Task: RNCM: Seizure management Completed 01/23/2022  Note:   Care Management Activities:    - barriers to meeting goals identified - change-talk evoked - choices provided - collaboration with team encouraged - decision-making supported - difficulty of making life-long changes acknowledged - health risks reviewed - problem-solving facilitated - questions answered - readiness for change evaluated - reassurance provided - resources needed to meet goals identified - self-reflection promoted - self-reliance encouraged - verbalization of feelings encouraged      Patient Care Plan: General Social Work (Adult)     Problem Identified: Quality of Life (General Plan of Care)      Long-Range Goal: Quality of Life Maintained Completed 05/11/2022  Start Date: 02/10/2021  This Visit's Progress: On track  Recent Progress: On track  Priority: Medium  Note:   Current barriers:   Patient is having depressive symptoms due to losing independence as she is unable to drive right now  Unable to consistently perform activities of daily living and needs additional assistance / support in order to meet this unmet need Unable to  independently self manage needs related to chronic health conditions.  Knowledge deficits related to short term plan for care coordination needs and long term plans for chronic disease management needs Clinical Goals: Over the next 120 days, patient will work with SW to address concerns related to stress management, increasing self care and coping with being unable to drive at this time Interventions : Assessed needs, level of care concerns, and barriers to care Pt reports management of BP with compliance with medications Neurologist will address pt's concerns that seizure  medication causes hair loss Reports no knee pain and obtained new glasses Strategies to assist in promoting healthy boundaries were discussed to promote stress management CCM LCSW reviewed upcoming appointments with patient.  Patient denies need for any additional resources at this time Discussed plans with patient for ongoing care management follow up and provided patient with direct contact information for care management team Collaboration with PCP regarding development and update of comprehensive plan of care as evidenced by provider attestation and co-signature Inter-disciplinary care team collaboration (see longitudinal plan of care) Patient Self Activities/Goals: Continue utilizing healthy coping skills discussed Follow up with PCP regarding request for assistance with hair loss and scheduled appointments Practice positive self-talk to promote positive mood     Patient Care Plan: CCM Pharmacy Care Plan     Problem Identified: HTN, CKD, Anemia, secondary hyperparpthyroidiism, HLD, seizure   Priority: High     Long-Range Goal: disease management   Start Date: 03/23/2022  Recent Progress: On track  Priority: High  Note:   Current Barriers:  Does not contact provider office for questions/concerns  Pharmacist Clinical Goal(s):  Patient will verbalize ability to afford treatment regimen through collaboration with PharmD and provider.   Interventions: 1:1 collaboration with Venita Lick, NP regarding development and update of comprehensive plan of care as evidenced by provider attestation and co-signature Inter-disciplinary care team collaboration (see longitudinal plan of care) Comprehensive medication review performed; medication list updated in electronic medical record  Hypertension (BP goal <140/90) BP Readings from Last 3 Encounters:  09/09/22 126/79  06/16/22 (!) 150/89  06/02/22 124/86  -Controlled -Current treatment: Losartan 123m QD Appropriate, Effective,  Safe, Accessible -Medications previously tried: Amlodipine (Edema)  -Current home readings: Doesn't test -Current dietary habits: "Tries to eat healthy" -Current exercise habits: None -Denies hypotensive/hypertensive symptoms -Educated on BP goals and benefits of medications for prevention of heart attack, stroke and kidney  damage; -Counseled to monitor BP at home daily, document, and provide log at future appointments -Recommended to continue current medication  Hyperlipidemia: (LDL goal < 100) The 10-year ASCVD risk score (Arnett DK, et al., 2019) is: 11%   Values used to calculate the score:     Age: 46 years     Sex: Female     Is Non-Hispanic African American: Yes     Diabetic: No     Tobacco smoker: No     Systolic Blood Pressure: 390 mmHg     Is BP treated: Yes     HDL Cholesterol: 51 mg/dL     Total Cholesterol: 187 mg/dL Lab Results  Component Value Date   CHOL 187 09/09/2022   CHOL 187 03/25/2022   CHOL 200 (H) 09/08/2021   Lab Results  Component Value Date   HDL 51 09/09/2022   HDL 60 03/25/2022   HDL 52 09/08/2021   Lab Results  Component Value Date   LDLCALC 118 (H) 09/09/2022   LDLCALC 108 (H) 03/25/2022   LDLCALC 131 (H) 09/08/2021   Lab Results  Component Value Date   TRIG 97 09/09/2022   TRIG 105 03/25/2022   TRIG 97 09/08/2021   Lab Results  Component Value Date   CHOLHDL 2.8 08/23/2018  No results found for: "LDLDIRECT" Last vitamin D Lab Results  Component Value Date   VD25OH 50.4 09/09/2022   Lab Results  Component Value Date   TSH 3.760 09/09/2022    -Controlled -Current treatment: Rosuvastatin 17m Appropriate, Effective, Safe, Accessible Started 09/09/22 -Medications previously tried: Atoravastatin (Started on 06/27/22 but stopped due to being ineffective) -Current dietary patterns: "Tries to eat healthy" -Current exercise habits: None -Educated on Cholesterol goals;  December 2023: Patient states she is compliant on meds.  However, she did state a few times she hates taking all these pills. If her Cholesterol remains high at next labs, could indicate non-compliance  Seizure (Goal: Decrease frequency) -Controlled -Current treatment  Lamotrigine 241mHS Appropriate, Effective, Safe, Accessible -Medications previously tried: N/A  December 2023: Per patient, last seizure was a year ago. Happened because her BP was elevated and couldn't get her BP meds. Gave patient my number to call if this happens again  Patient Goals/Self-Care Activities Patient will:  - take medications as prescribed as evidenced by patient report and record review  Follow Up Plan: The patient has been provided with contact information for the care management team and has been advised to call with any health related questions or concerns.      Patient Care Plan: RNCM: HLD Management  Completed 04/10/2022   Problem Identified: RNCM: HLD Management Resolved 01/23/2022  Priority: High  Onset Date: 09/17/2021     Goal: Patient-Specific Goal Completed 01/23/2022  Start Date: 09/17/2021  Expected End Date: 09/17/2022  Recent Progress: On track  Priority: High  Note:   Current Barriers: Resolving, duplicate goal Poorly controlled hyperlipidemia, complicated by HTN, CKD Current antihyperlipidemic regimen: Atorvastatin 40 mg QD, recently increased from 20 to 40 Most recent lipid panel:     Component Value Date/Time   CHOL 200 (H) 09/08/2021 0816   CHOL 178 06/03/2015 1127   TRIG 97 09/08/2021 0816   TRIG 93 06/03/2015 1127   HDL 52 09/08/2021 0816   CHOLHDL 2.8 08/23/2018 1635   VLDL 19 06/03/2015 1127   LDLCALC 131 (H) 09/08/2021 0816   ASCVD risk enhancing conditions: age >6>4 HTN, CKD Unable to independently manage HLD  RN Care Manager  Clinical Goal(s):  patient will work with Consulting civil engineer, providers, and care team towards execution of optimized self-health management plan patient will verbalize understanding of plan for  effective management of HLD  patient will work with [facility], CCM team and pcp  to address needs related to HLD  patient will demonstrate a decrease in HLD  exacerbations patient will take all medications exactly as prescribed and will call provider for medication related questions patient will attend all scheduled medical appointments: 03-09-2022 patient will demonstrate improved health management independence patient will verbalize basic understanding of HLD  disease process and self health management plan patient will demonstrate understanding of rationale for each prescribed medication the patient will demonstrate ongoing self health care management ability Interventions: Collaboration with Venita Lick, NP regarding development and update of comprehensive plan of care as evidenced by provider attestation and co-signature Inter-disciplinary care team collaboration (see longitudinal plan of care) Medication review performed; medication list updated in electronic medical record.  Inter-disciplinary care team collaboration (see longitudinal plan of care) Referred to pharmacy team for assistance with HLD medication management Evaluation of current treatment plan related to HLD  and patient's adherence to plan as established by provider. Advised patient to call the office for changes in conditions, questions, or concerns Provided education to patient re: heart healthy diet and monitoring for foods high in cholesterol  Reviewed medications with patient and discussed compliance  Reviewed scheduled/upcoming provider appointments including: 03-09-2022 Discussed plans with patient for ongoing care management follow up and provided patient with direct contact information for care management team Patient Goals/Self-Care Activities: - call for medicine refill 2 or 3 days before it runs out - call if I am sick and can't take my medicine - keep a list of all the medicines I take; vitamins and herbals  too - learn to read medicine labels - use a pillbox to sort medicine - use an alarm clock or phone to remind me to take my medicine - change to whole grain breads, cereal, pasta - drink 6 to 8 glasses of water each day - eat 3 to 5 servings of fruits and vegetables each day - eat 5 or 6 small meals each day - eat fish at least once per week - fill half the plate with nonstarchy vegetables - limit fast food meals to no more than 1 per week - manage portion size - prepare main meal at home 3 to 5 days each week - read food labels for fat, fiber, carbohydrates and portion size - reduce red meat to 2 to 3 times a week - be open to making changes - I can manage, know and watch for signs of a heart attack - if I have chest pain, call for help - learn about small changes that will make a big difference - learn my personal risk factors  Follow Up Plan: Telephone follow up appointment with care management team member scheduled for: 12-03-2021 at 0900 am      Patient Care Plan: RNCM: General Plan of Care (Adult) for Chronic Disease Management and Care Coordination Needs  Completed 06/12/2022   Problem Identified: RNCM: Development of Plan of Care for Chronic Disease Management ( HTN, HLD,CKD3, Seizures) Resolved 06/12/2022  Priority: High     Long-Range Goal: RNCM: Effective Management of Plan of Care for Chronic Disease Management ( HTN, HLD,CKD3, Seizures) Completed 06/12/2022  Start Date: 01/23/2022  Expected End Date: 01/23/2023  Priority: High  Note:   Current Barriers: 06-12-2022: Goals met  and care plan is being closed  Knowledge Deficits related to plan of care for management of HTN, HLD, CKD Stage 3, and Seizures  Chronic Disease Management support and education needs related to HTN, HLD, CKD Stage 3, and Seizures  RNCM Clinical Goal(s):  Patient will verbalize basic understanding of HTN, HLD, CKD Stage 3, and Seizures disease process and self health management plan as evidenced by  keeping appointments, stable blood pressures, no new seizure activity, and working with the CCM team to optimize health and well being take all medications exactly as prescribed and will call provider for medication related questions as evidenced by compliance with medications and calling for refills before running out of medications    attend all scheduled medical appointments: 09-09-2022 at 0800 am as evidenced by keeping appointments and call for schedule change needs         demonstrate improved and ongoing health management independence as evidenced by stable chronic conditions, calling the office for changes or questions and utilization of resources to help with effective management of chronic conditions        demonstrate ongoing self health care management ability for effective management of Chronic conditions as evidenced by working with the CCM team through collaboration with Consulting civil engineer, provider, and care team.   Interventions: 1:1 collaboration with primary care provider regarding development and update of comprehensive plan of care as evidenced by provider attestation and co-signature Inter-disciplinary care team collaboration (see longitudinal plan of care) Evaluation of current treatment plan related to  self management and patient's adherence to plan as established by provider   Chronic Kidney Disease (Status: Goal Met.)  Long Term Goal 06-12-2022: Goals met and care plan is being closed  Last practice recorded BP readings:  BP Readings from Last 3 Encounters:  06/02/22 124/86  03/25/22 140/82  09/08/21 (!) 147/79  Most recent eGFR/CrCl:  Lab Results  Component Value Date   EGFR 49 (L) 03/25/2022    No components found for: CRCL  Assessed the patient understanding of chronic kidney disease. 04-10-2022: The patient has a good understanding of CKD. She ask questions when she does not understand. She is compliant with provider visits and plan of care  Evaluation of current  treatment plan related to chronic kidney disease self management and patient's adherence to plan as established by provider. 04-10-2022: The patient is doing well and denies any new concerns related to kidney function. She states that she is feeling well.       Provided education to patient re: stroke prevention, s/s of heart attack and stroke    Reviewed prescribed diet Heart Healthy Diet  Reviewed medications with patient and discussed importance of compliance . 06-12-2022: The patient is compliant with medications.   Advised patient, providing education and rationale, to monitor blood pressure daily and record, calling PCP for findings outside established parameters. 04-10-2022: The patient has a good understanding of keeping blood pressures under control.   Discussed complications of poorly controlled blood pressure such as heart disease, stroke, circulatory complications, vision complications, kidney impairment, sexual dysfunction    Advised patient to discuss changes in kidney function, new concerns with kidney function, questions or concerns with provider    Discussed plans with patient for ongoing care management follow up and provided patient with direct contact information for care management team    Screening for signs and symptoms of depression related to chronic disease state      Discussed the impact of chronic kidney disease on  daily life and mental health and acknowledged and normalized feelings of disempowerment, fear, and frustration    Assessed social determinant of health barriers    Provided education on kidney disease progression      History of Seizures  (Status: Goal Met.) Long Term Goal 06-12-2022: Goals met and care plan is being closed  Evaluation of current treatment plan related to  Seizures ,  self-management and patient's adherence to plan as established by provider. 04-10-2022: The patient denies any issues with new seizure activity. Is doing well. Knows what to look for for  changes and new onset of seizure activity. 06-12-2022: The patient denies any new seizure activity. Had a brain MRI and it did not show any abnormal findings. The patient is doing well. Knows to call for changes. Discussed plans with patient for ongoing care management follow up and provided patient with direct contact information for care management team Advised patient to call the office for new onset of seizure activity, changes in mentation, or changes in health and well being related to seizures; Provided education to patient re: importance of medications compliance, routine visits for ongoing evaluation of chronic conditions and working with the CCM team to effectively manage chronic conditions; Reviewed medications with patient and discussed compliance, education provided to the patient on the importance of taking medications as directed and not missing doses. 06-12-2022: States compliance with medications. ; Provided patient with safety and preventing injuries, monitoring for changes in mentation educational materials related to seizure prevention and safety; Reviewed scheduled/upcoming provider appointments including September 2023; Discussed plans with patient for ongoing care management follow up and provided patient with direct contact information for care management team; Advised patient to discuss changes in seizure activity, new onset of seizures, and questions or concerns related to neurological health and well being with provider;  Hyperlipidemia:  (Status: Goal Not Met.) Long Term Goal 06-12-2022: Goals met and care plan is being closed  Lab Results  Component Value Date   CHOL 187 03/25/2022   HDL 60 03/25/2022   LDLCALC 108 (H) 03/25/2022   TRIG 105 03/25/2022   CHOLHDL 2.8 08/23/2018     Medication review performed; medication list updated in electronic medical record. 04-10-2022: The patient takes Lipitor 40 mg QD for cholesterol. Denies any issues or concerns with medications.  The patient is a little above goal and the pcp may consider increasing dose of Lipitor.  Provider established cholesterol goals reviewed. 04-10-2022: The patient is slightly above goal with LDL. Education and support given; Counseled on importance of regular laboratory monitoring as prescribed. 06-12-2022: The patient has regular lab testing. Will continue to monitor.  Provided HLD educational materials; Reviewed role and benefits of statin for ASCVD risk reduction; Reviewed importance of limiting foods high in cholesterol. 04-10-2022: Review of foods that will help with lowering blood cholesterol.   Hypertension: (Status: Goal Met.) Long Term Goal  06-12-2022: Goals met and care plan is being closed  Last practice recorded BP readings:  BP Readings from Last 3 Encounters:  06/02/22 124/86  03/25/22 140/82  09/08/21 (!) 147/79  Most recent eGFR/CrCl:  Lab Results  Component Value Date   EGFR 49 (L) 03/25/2022    No components found for: CRCL Self reported blood pressure of 130/70 Evaluation of current treatment plan related to hypertension self management and patient's adherence to plan as established by provider. 04-10-2022: The patient is taking her blood pressures and states they are WNL. Denies any new concerns with HTN or heart health;  Provided education to patient re: stroke prevention, s/s of heart attack and stroke; Reviewed prescribed diet heart healthy diet  Reviewed medications with patient and discussed importance of compliance. 04-10-2022: The patient is compliant with medications. Denies any needs with medications. Has started taking Norvasc at night because she noticed swelling in her feet and legs. She said since she switched to night she can tell a big difference and her shoes are not as tight. ;  Discussed plans with patient for ongoing care management follow up and provided patient with direct contact information for care management team; Advised patient, providing education  and rationale, to monitor blood pressure daily and record, calling PCP for findings outside established parameters;  Advised patient to discuss blood pressure trends with provider; Provided education on prescribed diet heart healthy diet ;  Discussed complications of poorly controlled blood pressure such as heart disease, stroke, circulatory complications, vision complications, kidney impairment, sexual dysfunction. 04-10-2022: The patient knows the importance of controlling blood pressures and having routine follow up;  Review of stressors that can impact her blood pressures. 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 She is having some arm pain at times in her right arm and she feels like this is due to lifting of her Mother. Education and support given. Review of discussing this with pcp at next appointment if it is not better. She takes Tylenol and this is effective with her pain relief. The patient denies any acute findings. She feels she is doing well right now.   Patient Goals/Self-Care Activities: Take medications as prescribed   Attend all scheduled provider appointments Call pharmacy for medication refills 3-7 days in advance of running out of medications Attend church or other social activities Perform all self care activities independently  Perform IADL's (shopping, preparing meals, housekeeping, managing finances) independently Call provider office for new concerns or questions  Work with the social worker to address care coordination needs and will continue to work with the clinical team to address health care and disease management related needs call the Suicide and Crisis Lifeline: 988 call the [place] National Suicide Prevention Lifeline: 786-828-6732 or TTY: 815-228-7581 TTY 340-459-0282) to talk to a trained counselor call 1-800-273-TALK (toll free, 24 hour hotline) if experiencing a Mental Health or [place]  check blood pressure 3 times per  week choose a place to take my blood pressure (home, clinic or office, retail store) write blood pressure results in a log or diary learn about high blood pressure keep a blood pressure log take blood pressure log to all doctor appointments call doctor for signs and symptoms of high blood pressure develop an action plan for high blood pressure keep all doctor appointments take medications for blood pressure exactly as prescribed begin an exercise program report new symptoms to your doctor eat more whole grains, fruits and vegetables, lean meats and healthy fats - call for medicine refill 2 or 3 days before it runs out - take all medications exactly as prescribed - call doctor with any symptoms you believe are related to your medicine - call doctor when you experience any new symptoms - go to all doctor appointments as scheduled - adhere to prescribed diet: Heart Healthy Diet - develop an exercise routine       Ms. Furrow was given information about Chronic Care Management services today including:  CCM service includes personalized support from designated clinical staff supervised by her physician, including individualized plan of care and coordination with other care  providers 24/7 contact phone numbers for assistance for urgent and routine care needs. Standard insurance, coinsurance, copays and deductibles apply for chronic care management only during months in which we provide at least 20 minutes of these services. Most insurances cover these services at 100%, however patients may be responsible for any copay, coinsurance and/or deductible if applicable. This service may help you avoid the need for more expensive face-to-face services. Only one practitioner may furnish and bill the service in a calendar month. The patient may stop CCM services at any time (effective at the end of the month) by phone call to the office staff.  Patient agreed to services and verbal consent obtained.    The patient verbalized understanding of instructions, educational materials, and care plan provided today and DECLINED offer to receive copy of patient instructions, educational materials, and care plan.  CPP F/U [place], [place]

## 2022-12-20 DIAGNOSIS — I10 Essential (primary) hypertension: Secondary | ICD-10-CM

## 2022-12-20 DIAGNOSIS — E7800 Pure hypercholesterolemia, unspecified: Secondary | ICD-10-CM

## 2023-01-15 ENCOUNTER — Other Ambulatory Visit: Payer: Self-pay | Admitting: Nurse Practitioner

## 2023-01-15 NOTE — Telephone Encounter (Signed)
Change in pharmacy Requested Prescriptions  Pending Prescriptions Disp Refills   omeprazole (PRILOSEC) 20 MG capsule [Pharmacy Med Name: OMEPRAZOLE 20 MG Capsule Delayed Release] 90 capsule 2    Sig: TAKE 1 CAPSULE EVERY DAY     Gastroenterology: Proton Pump Inhibitors Passed - 01/15/2023  1:32 PM      Passed - Valid encounter within last 12 months    Recent Outpatient Visits           4 months ago Seizure ([place])   [place], Henrine Screws T, NP   7 months ago Allergic contact dermatitis due to plants, except food   [place] Kathrine Haddock, NP   9 months ago Seizure [facility])   [place], Henrine Screws T, NP   1 year ago Seizure [facility])   [place], Henrine Screws T, NP   1 year ago Seizure [facility])   [place], Barbaraann Faster, NP       Future Appointments             In 1 month [place], Barbaraann Faster, NP [place], [facility]

## 2023-01-26 ENCOUNTER — Other Ambulatory Visit: Payer: Self-pay | Admitting: Nurse Practitioner

## 2023-01-26 NOTE — Telephone Encounter (Signed)
Medication Refill - Medication: rosuvastatin (CRESTOR) 40 MG tablet   Has the patient contacted their pharmacy? Yes.     Preferred Pharmacy (with phone number or street name):  [place], [place] Phone: [phone]  Fax: [phone]     Has the patient been seen for an appointment in the last year OR does the patient have an upcoming appointment? Yes.    Please assist patient further

## 2023-01-27 NOTE — Telephone Encounter (Signed)
Rx 09/10/22 #90 4RF- too soon Requested Prescriptions  Pending Prescriptions Disp Refills   rosuvastatin (CRESTOR) 40 MG tablet 90 tablet 4    Sig: Take 1 tablet (40 mg total) by mouth daily.     Cardiovascular:  Antilipid - Statins 2 Failed - 01/26/2023 12:54 PM      Failed - Cr in normal range and within 360 days    Creatinine, Ser  Date Value Ref Range Status  09/09/2022 1.35 (H) 0.57 - 1.00 mg/dL Final   Creatinine, Urine  Date Value Ref Range Status  12/23/2020 102 mg/dL Final    Comment:    Performed at [facility], [address] [place], [place] [address]         Failed - Lipid Panel in normal range within the last 12 months    Cholesterol, Total  Date Value Ref Range Status  09/09/2022 187 100 - 199 mg/dL Final   Cholesterol Piccolo, Waived  Date Value Ref Range Status  06/03/2015 178 <200 mg/dL Final    Comment:                            Desirable                <200                         Borderline High      200- 239                         High                     >239    LDL Chol Calc (NIH)  Date Value Ref Range Status  09/09/2022 118 (H) 0 - 99 mg/dL Final   HDL  Date Value Ref Range Status  09/09/2022 51 >39 mg/dL Final   Triglycerides  Date Value Ref Range Status  09/09/2022 97 0 - 149 mg/dL Final   Triglycerides Piccolo,Waived  Date Value Ref Range Status  06/03/2015 93 <150 mg/dL Final    Comment:                            Normal                   <150                         Borderline High     150 - 199                         High                200 - 499                         Very High                >499          Passed - Patient is not pregnant      Passed - Valid encounter within last 12 months    Recent Outpatient Visits           4 months ago Seizure [facility])   [place], [place]  T, NP   7 months ago Allergic contact dermatitis due to plants, except food   [place] Kathrine Haddock, NP   10 months ago Seizure [facility])   [place], Henrine Screws T, NP   1 year ago Seizure [facility])   [place], Henrine Screws T, NP   1 year ago Seizure [facility])   [place], Barbaraann Faster, NP       Future Appointments             In 1 month Cannady, Barbaraann Faster, NP [place], [facility]

## 2023-02-19 ENCOUNTER — Other Ambulatory Visit: Payer: Self-pay | Admitting: Nurse Practitioner

## 2023-02-19 DIAGNOSIS — Z1231 Encounter for screening mammogram for malignant neoplasm of breast: Secondary | ICD-10-CM

## 2023-03-04 ENCOUNTER — Ambulatory Visit: Payer: Self-pay

## 2023-03-04 NOTE — Telephone Encounter (Signed)
The patient would like to be contacted by a member of staff to discuss their allergies as well as   The patient shares that they have previously experienced allergies and they're otc medication has been ineffective   The patient would like to know what they can take that wont affect their current medications or make them drowsy   Please contact further when possible   Left message to call back about symptoms.

## 2023-03-04 NOTE — Telephone Encounter (Signed)
Summary: allergy concerns / rx concern   The patient would like to be contacted by a member of staff to discuss their allergies as well as  The patient shares that they have previously experienced allergies and they're otc medication has been ineffective  The patient would like to know what they can take that wont affect their current medications or make them drowsy  Please contact further when possible       Chief Complaint: seasonal allergies requesting medication  Symptoms: sneezing, coughing, nasal stuffiness one side of nose, throat dry. Every time goes outside.covid test negative.  Frequency: na  Pertinent Negatives: Patient denies chest pain no difficulty breathing no fever Disposition: '[]'$ ED /'[]'$ Urgent Care (no appt availability in office) / '[]'$ Appointment(In office/virtual)/ '[]'$  [place] [facility]/ '[]'$ Home Care/ '[]'$ Refused Recommended Disposition /'[]'$ [place] [facility]/ '[x]'$  Follow-up with PCP Additional Notes:   Recommended appt earlier than already scheduled appt 03/11/23.Patient reports she has tried OTC loratadine and requesting if PCP can prescribe Claritin nondrowsy medication to help her. Please advise  if another OV needed.         Reason for Disposition  [1] Taking antihistamines > 2 days AND [2] nasal allergy symptoms interfere with sleep, school, or work  Answer Assessment - Initial Assessment Questions 1. SYMPTOM: "What's the main symptom you're concerned about?" (e.g., runny nose, stuffiness, sneezing, itching)     Nose stuffiness , sneezing, cough, throat dry  2. SEVERITY: "How bad is it?" "What does it keep you from doing?" (e.g., sleeping, working)      Sx worsen everytime she walks outside  3. EYES: "Are the eyes also red, watery, and itchy?"      no 4. TRIGGER: "What pollen or other allergic substance do you think is causing the symptoms?"      Pollen outside  5. TREATMENT: "What medicine are you using?" "What medicine worked best in the past?"      OTC loratidine not effective 6. OTHER SYMPTOMS: "Do you have any other symptoms?" (e.g., coughing, difficulty breathing, wheezing)     Coughing sneezing stuffy nose, covid test negative 7. PREGNANCY: "Is there any chance you are pregnant?" "When was your last menstrual period?"     na  Protocols used: Nasal Allergies (Hay Fever)-A-AH

## 2023-03-04 NOTE — Telephone Encounter (Signed)
Patient made aware of Provider's recommendations and verbalized understanding.   

## 2023-03-04 NOTE — Telephone Encounter (Signed)
Pt stated that she was not coming in again for allergies, said that she has an appointment on 03/12/2023 and she would talk about it then.  I apologized to her.

## 2023-03-10 ENCOUNTER — Ambulatory Visit: Payer: Medicare HMO | Admitting: Nurse Practitioner

## 2023-03-11 ENCOUNTER — Ambulatory Visit: Payer: Medicare HMO | Admitting: Nurse Practitioner

## 2023-03-11 DIAGNOSIS — E7800 Pure hypercholesterolemia, unspecified: Secondary | ICD-10-CM

## 2023-03-11 DIAGNOSIS — R801 Persistent proteinuria, unspecified: Secondary | ICD-10-CM

## 2023-03-11 DIAGNOSIS — N2581 Secondary hyperparathyroidism of renal origin: Secondary | ICD-10-CM

## 2023-03-11 DIAGNOSIS — R569 Unspecified convulsions: Secondary | ICD-10-CM

## 2023-03-11 DIAGNOSIS — I10 Essential (primary) hypertension: Secondary | ICD-10-CM

## 2023-03-11 DIAGNOSIS — N1832 Chronic kidney disease, stage 3b: Secondary | ICD-10-CM

## 2023-03-12 ENCOUNTER — Ambulatory Visit
Admission: RE | Admit: 2023-03-12 | Discharge: 2023-03-12 | Disposition: A | Discharge status: Home / Self Care | Payer: Medicare PPO | Source: Ambulatory Visit | Attending: Nurse Practitioner | Admitting: Nurse Practitioner

## 2023-03-12 DIAGNOSIS — Z1231 Encounter for screening mammogram for malignant neoplasm of breast: Secondary | ICD-10-CM | POA: Insufficient documentation

## 2023-03-15 ENCOUNTER — Ambulatory Visit: Payer: Self-pay | Admitting: *Deleted

## 2023-03-15 NOTE — Progress Notes (Signed)
Contacted via MyChart   Good morning Demaya, please reach out to [facility] breast center, they will also be trying to contact you as there is more imaging required to right breast as there was mild asymmetry and they want to further assess area.  Any questions? Please call to schedule your mammogram and/or bone density: [facility] at [place]: [address], [place], [place] [address] Phone: [phone]  [place] [facility] [address] [place],  [place]  [address] Phone: [phone]

## 2023-03-15 NOTE — Telephone Encounter (Signed)
Reason for Disposition  [1] Follow-up call to recent contact AND [2] information only call, no triage required  Answer Assessment - Initial Assessment Questions 1. REASON FOR CALL or QUESTION: "What is your reason for calling today?" or "How can I best help you?" or "What question do you have that I can help answer?"     On MyChart I got a message.  Left breast result is something I don't understand.  I know I need to call the breast center and I have their number I just didn't understand my results.     I read her the message from Marnee Guarneri, NP dated 03/15/2023 at 10:115 AM that was put into her MyChart.   Pt stated she already had the number and would call them.      She also wanted to know what asymmetry meant so I told her and she thanked me for my help.     She mentioned,   "I hope it isn't cancer".  Protocols used: Information Only Call - No Triage-A-AH

## 2023-03-15 NOTE — Telephone Encounter (Signed)
  Chief Complaint: Did not understand her mammogram results.   I read her the message from [facility], NP that was put in the MyChart to the pt.    Symptoms:  Frequency:  Pertinent Negatives: Patient denies  Disposition: [] ED /[] Urgent Care (no appt availability in office) / [] Appointment(In office/virtual)/ []  [place] [facility]/ [x] Home Care/ [] Refused Recommended Disposition /[] [place] [facility]/ []  Follow-up with PCP Additional Notes: She is going to call the breast center to get scheduled for additional exam.

## 2023-03-16 ENCOUNTER — Other Ambulatory Visit: Payer: Self-pay | Admitting: Nurse Practitioner

## 2023-03-16 DIAGNOSIS — N6489 Other specified disorders of breast: Secondary | ICD-10-CM

## 2023-03-16 DIAGNOSIS — R928 Other abnormal and inconclusive findings on diagnostic imaging of breast: Secondary | ICD-10-CM

## 2023-03-26 ENCOUNTER — Ambulatory Visit
Admission: RE | Admit: 2023-03-26 | Discharge: 2023-03-26 | Disposition: A | Discharge status: Home / Self Care | Payer: Medicare PPO | Source: Ambulatory Visit | Attending: Nurse Practitioner | Admitting: Nurse Practitioner

## 2023-03-26 DIAGNOSIS — N6489 Other specified disorders of breast: Secondary | ICD-10-CM | POA: Diagnosis present

## 2023-03-26 DIAGNOSIS — R928 Other abnormal and inconclusive findings on diagnostic imaging of breast: Secondary | ICD-10-CM | POA: Diagnosis present

## 2023-04-08 ENCOUNTER — Ambulatory Visit: Payer: Medicare HMO | Admitting: Nurse Practitioner

## 2023-04-09 ENCOUNTER — Ambulatory Visit: Payer: Medicare PPO | Admitting: Nurse Practitioner

## 2023-04-09 DIAGNOSIS — R801 Persistent proteinuria, unspecified: Secondary | ICD-10-CM

## 2023-04-09 DIAGNOSIS — Z853 Personal history of malignant neoplasm of breast: Secondary | ICD-10-CM

## 2023-04-09 DIAGNOSIS — R569 Unspecified convulsions: Secondary | ICD-10-CM

## 2023-04-09 DIAGNOSIS — E7800 Pure hypercholesterolemia, unspecified: Secondary | ICD-10-CM

## 2023-04-09 DIAGNOSIS — N2581 Secondary hyperparathyroidism of renal origin: Secondary | ICD-10-CM

## 2023-04-09 DIAGNOSIS — I10 Essential (primary) hypertension: Secondary | ICD-10-CM

## 2023-04-09 DIAGNOSIS — N1832 Chronic kidney disease, stage 3b: Secondary | ICD-10-CM

## 2023-04-28 ENCOUNTER — Ambulatory Visit (INDEPENDENT_AMBULATORY_CARE_PROVIDER_SITE_OTHER): Payer: Medicare PPO | Admitting: Nurse Practitioner

## 2023-04-28 ENCOUNTER — Encounter: Payer: Self-pay | Admitting: Nurse Practitioner

## 2023-04-28 VITALS — BP 128/84 | HR 69 | Temp 98.3°F | Ht <= 58 in | Wt 151.1 lb

## 2023-04-28 DIAGNOSIS — Z1211 Encounter for screening for malignant neoplasm of colon: Secondary | ICD-10-CM | POA: Diagnosis not present

## 2023-04-28 DIAGNOSIS — R801 Persistent proteinuria, unspecified: Secondary | ICD-10-CM

## 2023-04-28 DIAGNOSIS — I10 Essential (primary) hypertension: Secondary | ICD-10-CM | POA: Diagnosis not present

## 2023-04-28 DIAGNOSIS — R569 Unspecified convulsions: Secondary | ICD-10-CM

## 2023-04-28 DIAGNOSIS — N2581 Secondary hyperparathyroidism of renal origin: Secondary | ICD-10-CM | POA: Diagnosis not present

## 2023-04-28 DIAGNOSIS — N1832 Chronic kidney disease, stage 3b: Secondary | ICD-10-CM

## 2023-04-28 DIAGNOSIS — K219 Gastro-esophageal reflux disease without esophagitis: Secondary | ICD-10-CM

## 2023-04-28 DIAGNOSIS — E7800 Pure hypercholesterolemia, unspecified: Secondary | ICD-10-CM | POA: Diagnosis not present

## 2023-04-28 LAB — MICROALBUMIN, URINE WAIVED
Creatinine, Urine Waived: 100 mg/dL (ref 10–300)
Microalb, Ur Waived: 150 mg/L — ABNORMAL HIGH (ref 0–19)

## 2023-04-28 NOTE — Assessment & Plan Note (Signed)
Chronic, stable.  BP at goal for age on recheck. Will continue Losartan 100 MG and Amlodipine.  Recommend she monitor BP at home at least as few mornings a week and document, report to provider if consistent elevation >130/90.  DASH diet focus.  LABS: CBC, CMP, urine ALB.  Return in 6 months.

## 2023-04-28 NOTE — Assessment & Plan Note (Signed)
Chronic, stable at this time.  Continue to collaborate with neurology and continue current medication regimen as prescribed by them. ?

## 2023-04-28 NOTE — Patient Instructions (Signed)
Magnesium 400 MG at night with Melatonin  DASH Eating Plan DASH stands for Dietary Approaches to Stop Hypertension. The DASH eating plan is a healthy eating plan that has been shown to: Reduce high blood pressure (hypertension). Reduce your risk for type 2 diabetes, heart disease, and stroke. Help with weight loss. What are tips for following this plan? Reading food labels Check food labels for the amount of salt (sodium) per serving. Choose foods with less than 5 percent of the Daily Value of sodium. Generally, foods with less than 300 milligrams (mg) of sodium per serving fit into this eating plan. To find whole grains, look for the word "whole" as the first word in the ingredient list. Shopping Buy products labeled as "low-sodium" or "no salt added." Buy fresh foods. Avoid canned foods and pre-made or frozen meals. Cooking Avoid adding salt when cooking. Use salt-free seasonings or herbs instead of table salt or sea salt. Check with your health care provider or pharmacist before using salt substitutes. Do not fry foods. Cook foods using healthy methods such as baking, boiling, grilling, roasting, and broiling instead. Cook with heart-healthy oils, such as olive, canola, avocado, soybean, or sunflower oil. Meal planning  Eat a balanced diet that includes: 4 or more servings of fruits and 4 or more servings of vegetables each day. Try to fill one-half of your plate with fruits and vegetables. 6-8 servings of whole grains each day. Less than 6 oz (170 g) of lean meat, poultry, or fish each day. A 3-oz (85-g) serving of meat is about the same size as a deck of cards. One egg equals 1 oz (28 g). 2-3 servings of low-fat dairy each day. One serving is 1 cup (237 mL). 1 serving of nuts, seeds, or beans 5 times each week. 2-3 servings of heart-healthy fats. Healthy fats called omega-3 fatty acids are found in foods such as walnuts, flaxseeds, fortified milks, and eggs. These fats are also found  in cold-water fish, such as sardines, salmon, and mackerel. Limit how much you eat of: Canned or prepackaged foods. Food that is high in trans fat, such as some fried foods. Food that is high in saturated fat, such as fatty meat. Desserts and other sweets, sugary drinks, and other foods with added sugar. Full-fat dairy products. Do not salt foods before eating. Do not eat more than 4 egg yolks a week. Try to eat at least 2 vegetarian meals a week. Eat more home-cooked food and less restaurant, buffet, and fast food. Lifestyle When eating at a restaurant, ask that your food be prepared with less salt or no salt, if possible. If you drink alcohol: Limit how much you use to: 0-1 drink a day for women who are not pregnant. 0-2 drinks a day for men. Be aware of how much alcohol is in your drink. In the [place] one drink equals one 12 oz bottle of beer (355 mL), one 5 oz glass of wine (148 mL), or one 1 oz glass of hard liquor (44 mL). General information Avoid eating more than 2,300 mg of salt a day. If you have hypertension, you may need to reduce your sodium intake to 1,500 mg a day. Work with your health care provider to maintain a healthy body weight or to lose weight. Ask what an ideal weight is for you. Get at least 30 minutes of exercise that causes your heart to beat faster (aerobic exercise) most days of the week. Activities may include walking, swimming, or biking. Work  with your health care provider or dietitian to adjust your eating plan to your individual calorie needs. What foods should I eat? Fruits All fresh, dried, or frozen fruit. Canned fruit in natural juice (without added sugar). Vegetables Fresh or frozen vegetables (raw, steamed, roasted, or grilled). Low-sodium or reduced-sodium tomato and vegetable juice. Low-sodium or reduced-sodium tomato sauce and tomato paste. Low-sodium or reduced-sodium canned vegetables. Grains Whole-grain or whole-wheat bread. Whole-grain or  whole-wheat pasta. Brown rice. [place]. Bulgur. Whole-grain and low-sodium cereals. Pita bread. Low-fat, low-sodium crackers. Whole-wheat flour tortillas. Meats and other proteins Skinless chicken or [place]. Ground chicken or [place]. Pork with fat trimmed off. Fish and seafood. Egg whites. Dried beans, peas, or lentils. Unsalted nuts, nut butters, and seeds. Unsalted canned beans. Lean cuts of beef with fat trimmed off. Low-sodium, lean precooked or cured meat, such as sausages or meat loaves. Dairy Low-fat (1%) or fat-free (skim) milk. Reduced-fat, low-fat, or fat-free cheeses. Nonfat, low-sodium ricotta or cottage cheese. Low-fat or nonfat yogurt. Low-fat, low-sodium cheese. Fats and oils Soft margarine without trans fats. Vegetable oil. Reduced-fat, low-fat, or light mayonnaise and salad dressings (reduced-sodium). Canola, safflower, olive, avocado, soybean, and sunflower oils. Avocado. Seasonings and condiments Herbs. Spices. Seasoning mixes without salt. Other foods Unsalted popcorn and pretzels. Fat-free sweets. The items listed above may not be a complete list of foods and beverages you can eat. Contact a dietitian for more information. What foods should I avoid? Fruits Canned fruit in a light or heavy syrup. Fried fruit. Fruit in cream or butter sauce. Vegetables Creamed or fried vegetables. Vegetables in a cheese sauce. Regular canned vegetables (not low-sodium or reduced-sodium). Regular canned tomato sauce and paste (not low-sodium or reduced-sodium). Regular tomato and vegetable juice (not low-sodium or reduced-sodium). Angie Fava. Olives. Grains Baked goods made with fat, such as croissants, muffins, or some breads. Dry pasta or rice meal packs. Meats and other proteins Fatty cuts of meat. Ribs. Fried meat. Berniece Salines. Bologna, salami, and other precooked or cured meats, such as sausages or meat loaves. Fat from the back of a pig (fatback). Bratwurst. Salted nuts and seeds. Canned  beans with added salt. Canned or smoked fish. Whole eggs or egg yolks. Chicken or [place] with skin. Dairy Whole or 2% milk, cream, and half-and-half. Whole or full-fat cream cheese. Whole-fat or sweetened yogurt. Full-fat cheese. Nondairy creamers. Whipped toppings. Processed cheese and cheese spreads. Fats and oils Butter. Stick margarine. Lard. Shortening. Ghee. Bacon fat. Tropical oils, such as coconut, palm kernel, or palm oil. Seasonings and condiments Onion salt, garlic salt, seasoned salt, table salt, and sea salt. Worcestershire sauce. Tartar sauce. Barbecue sauce. Teriyaki sauce. Soy sauce, including reduced-sodium. Steak sauce. Canned and packaged gravies. Fish sauce. Oyster sauce. Cocktail sauce. Store-bought horseradish. Ketchup. Mustard. Meat flavorings and tenderizers. Bouillon cubes. Hot sauces. Pre-made or packaged marinades. Pre-made or packaged taco seasonings. Relishes. Regular salad dressings. Other foods Salted popcorn and pretzels. The items listed above may not be a complete list of foods and beverages you should avoid. Contact a dietitian for more information. Where to find more information National Heart, Lung, and Blood Institute: https://wilson-eaton.com/ American Heart Association: www.heart.org Academy of Nutrition and Dietetics: www.eatright.[place]: www.kidney.org Summary The DASH eating plan is a healthy eating plan that has been shown to reduce high blood pressure (hypertension). It may also reduce your risk for type 2 diabetes, heart disease, and stroke. When on the DASH eating plan, aim to eat more fresh fruits and vegetables, whole grains, lean proteins,  low-fat dairy, and heart-healthy fats. With the DASH eating plan, you should limit salt (sodium) intake to 2,300 mg a day. If you have hypertension, you may need to reduce your sodium intake to 1,500 mg a day. Work with your health care provider or dietitian to adjust your eating plan to your  individual calorie needs. This information is not intended to replace advice given to you by your health care provider. Make sure you discuss any questions you have with your health care provider. Document Revised: 11/10/2019 Document Reviewed: 11/10/2019 Elsevier Patient Education  [place].

## 2023-04-28 NOTE — Progress Notes (Signed)
BP 128/84 (BP Location: Left Arm, Patient Position: Sitting, Cuff Size: Normal)   Pulse 69   Temp 98.3 F (36.8 C) (Oral)   Ht 4\' 7"  (1.397 m)   Wt 151 lb 1.6 oz (68.5 kg)   SpO2 99%   BMI 35.12 kg/m    Subjective:    Patient ID: Kelly Poole, female    DOB: Apr 24, 1951, 72 y.o.   MRN: [id number]  HPI: Kelly Poole is a 73 y.o. female  Chief Complaint  Patient presents with   Hypertension   Hyperlipidemia   Seizures   Chronic Kidney Disease   HYPERTENSION / HYPERLIPIDEMIA Taking Losartan 100 MG daily, Amlodipine 2.5 MG, & Rosuvastatin 40 MG daily.  Followed by neuro for past seizure issues = seen last 05/21/22 with improved MRI on assessment.  Taking Lamictal 1 tablet at night, a reduced dose -- continues to take for at least 2 years -- should stop in July 2024.  Was to return 01/21/2023, but cancelled -- returns in July 2024. Satisfied with current treatment? yes Duration of hypertension: chronic BP monitoring frequency: occasional BP range: <130/80 sometimes a little higher BP medication side effects: no Duration of hyperlipidemia: chronic Cholesterol medication side effects: no Cholesterol supplements: none Medication compliance: good compliance Aspirin: yes Recent stressors: no Recurrent headaches: no Visual changes: no Palpitations: no Dyspnea: no Chest pain: no Lower extremity edema: no Dizzy/lightheaded: no  The 10-year ASCVD risk score (Arnett DK, et al., 2019) is: 10.7%   Values used to calculate the score:     Age: 83 years     Sex: Female     Is Non-Hispanic African American: Yes     Diabetic: No     Tobacco smoker: No     Systolic Blood Pressure: 128 mmHg     Is BP treated: Yes     HDL Cholesterol: 51 mg/dL     Total Cholesterol: 187 mg/dL  GERD Continues on Prilosec 20 MG daily. GERD control status: stable  Satisfied with current treatment? yes Heartburn frequency: none Medication side effects: no  Medication compliance:  stable Previous GERD medications: OTC medications Antacid use frequency:  none Dysphagia: no Odynophagia:  no Hematemesis: no Blood in stool: no EGD: no  CHRONIC KIDNEY DISEASE Saw nephrology last on 05/20/22 and returns 05/20/23.    Taking iron daily & B12 for past anemia in CKD + elevation in ferritin, was followed by hematology for this.  Last visit 06/02/22 to return PRN. CKD status: stable Medications renally dose: no Previous renal evaluation: no Pneumovax:  refused Influenza Vaccine:  refused    Latest Ref Rng & Units 09/09/2022    8:33 AM 03/25/2022    9:05 AM 09/08/2021    8:16 AM  BMP  Glucose 70 - 99 mg/dL 409  96  98   BUN 8 - 27 mg/dL 23  15  23    Creatinine 0.57 - 1.00 mg/dL 8.11  9.14  7.82   BUN/Creat Ratio 12 - 28 17  12  17    Sodium 134 - 144 mmol/L 138  141  142   Potassium 3.5 - 5.2 mmol/L 4.6  4.4  4.4   Chloride 96 - 106 mmol/L 103  103  105   CO2 20 - 29 mmol/L 21  23  19    Calcium 8.7 - 10.3 mg/dL 95.6  9.6  9.8      Relevant past medical, surgical, family and social history reviewed and updated as indicated. Interim medical history  since our last visit reviewed. Allergies and medications reviewed and updated.  Review of Systems  Constitutional:  Negative for activity change, appetite change, diaphoresis, fatigue and fever.  Respiratory:  Negative for cough, chest tightness and shortness of breath.   Cardiovascular:  Negative for chest pain, palpitations and leg swelling.  Gastrointestinal: Negative.   Neurological: Negative.   Psychiatric/Behavioral: Negative.     Per HPI unless specifically indicated above     Objective:    BP 128/84 (BP Location: Left Arm, Patient Position: Sitting, Cuff Size: Normal)   Pulse 69   Temp 98.3 F (36.8 C) (Oral)   Ht 4\' 7"  (1.397 m)   Wt 151 lb 1.6 oz (68.5 kg)   SpO2 99%   BMI 35.12 kg/m   Wt Readings from Last 3 Encounters:  04/28/23 151 lb 1.6 oz (68.5 kg)  09/09/22 152 lb (68.9 kg)  06/16/22 152 lb  (68.9 kg)    Physical Exam Vitals and nursing note reviewed.  Constitutional:      General: She is awake. She is not in acute distress.    Appearance: She is well-developed and well-groomed. She is obese. She is not ill-appearing or toxic-appearing.  HENT:     Head: Normocephalic.     Right Ear: Hearing normal.     Left Ear: Hearing normal.  Eyes:     General: Lids are normal.        Right eye: No discharge.        Left eye: No discharge.     Conjunctiva/sclera: Conjunctivae normal.     Pupils: Pupils are equal, round, and reactive to light.  Neck:     Thyroid: No thyromegaly.     Vascular: No carotid bruit.  Cardiovascular:     Rate and Rhythm: Normal rate and regular rhythm.     Heart sounds: Normal heart sounds. No murmur heard.    No gallop.  Pulmonary:     Effort: Pulmonary effort is normal. No accessory muscle usage or respiratory distress.     Breath sounds: Normal breath sounds.  Abdominal:     General: Bowel sounds are normal.     Palpations: Abdomen is soft.  Musculoskeletal:     Cervical back: Normal range of motion and neck supple.     Right lower leg: No edema.     Left lower leg: No edema.  Lymphadenopathy:     Cervical: No cervical adenopathy.  Skin:    General: Skin is warm and dry.  Neurological:     Mental Status: She is alert and oriented to person, place, and time.  Psychiatric:        Attention and Perception: Attention normal.        Mood and Affect: Mood normal.        Speech: Speech normal.        Behavior: Behavior normal. Behavior is cooperative.        Thought Content: Thought content normal.    Results for orders placed or performed in visit on 09/09/22  CBC with Differential/Platelet  Result Value Ref Range   WBC 6.4 3.4 - 10.8 x10E3/uL   RBC 3.51 (L) 3.77 - 5.28 x10E6/uL   Hemoglobin 10.8 (L) 11.1 - 15.9 g/dL   Hematocrit 16.1 (L) 09.6 - 46.6 %   MCV 96 79 - 97 fL   MCH 30.8 26.6 - 33.0 pg   MCHC 32.1 31.5 - 35.7 g/dL   RDW 04.5  40.9 - 81.1 %   Platelets  292 150 - 450 x10E3/uL   Neutrophils 48 Not Estab. %   Lymphs 39 Not Estab. %   Monocytes 7 Not Estab. %   Eos 5 Not Estab. %   Basos 1 Not Estab. %   Neutrophils Absolute 3.1 1.4 - 7.0 x10E3/uL   Lymphocytes Absolute 2.5 0.7 - 3.1 x10E3/uL   Monocytes Absolute 0.5 0.1 - 0.9 x10E3/uL   EOS (ABSOLUTE) 0.3 0.0 - 0.4 x10E3/uL   Basophils Absolute 0.1 0.0 - 0.2 x10E3/uL   Immature Granulocytes 0 Not Estab. %   Immature Grans (Abs) 0.0 0.0 - 0.1 x10E3/uL  Comprehensive metabolic panel  Result Value Ref Range   Glucose 100 (H) 70 - 99 mg/dL   BUN 23 8 - 27 mg/dL   Creatinine, Ser 1.61 (H) 0.57 - 1.00 mg/dL   eGFR 43 (L) >09 UE/AVW/0.98   BUN/Creatinine Ratio 17 12 - 28   Sodium 138 134 - 144 mmol/L   Potassium 4.6 3.5 - 5.2 mmol/L   Chloride 103 96 - 106 mmol/L   CO2 21 20 - 29 mmol/L   Calcium 10.0 8.7 - 10.3 mg/dL   Total Protein 7.2 6.0 - 8.5 g/dL   Albumin 4.8 3.9 - 4.9 g/dL   Globulin, Total 2.4 1.5 - 4.5 g/dL   Albumin/Globulin Ratio 2.0 1.2 - 2.2   Bilirubin Total 0.3 0.0 - 1.2 mg/dL   Alkaline Phosphatase 113 44 - 121 IU/L   AST 23 0 - 40 IU/L   ALT 23 0 - 32 IU/L  Lipid Panel w/o Chol/HDL Ratio  Result Value Ref Range   Cholesterol, Total 187 100 - 199 mg/dL   Triglycerides 97 0 - 149 mg/dL   HDL 51 >11 mg/dL   VLDL Cholesterol Cal 18 5 - 40 mg/dL   LDL Chol Calc (NIH) 914 (H) 0 - 99 mg/dL  TSH  Result Value Ref Range   TSH 3.760 0.450 - 4.500 uIU/mL  VITAMIN D 25 Hydroxy (Vit-D Deficiency, Fractures)  Result Value Ref Range   Vit D, 25-Hydroxy 50.4 30.0 - 100.0 ng/mL  Vitamin B12  Result Value Ref Range   Vitamin B-12 925 232 - 1,245 pg/mL  Iron Binding Cap (TIBC)([facility])  Result Value Ref Range   Total Iron Binding Capacity 230 (L) 250 - 450 ug/dL   UIBC 782 956 - 213 ug/dL   Iron 85 27 - 086 ug/dL   Iron Saturation 37 15 - 55 %  Ferritin  Result Value Ref Range   Ferritin 355 (H) 15 - 150 ng/mL  T4, free  Result  Value Ref Range   Free T4 1.21 0.82 - 1.77 ng/dL  Magnesium  Result Value Ref Range   Magnesium 1.8 1.6 - 2.3 mg/dL      Assessment & Plan:   Problem List Items Addressed This Visit       Cardiovascular and Mediastinum   Hypertension    Chronic, stable.  BP at goal for age on recheck. Will continue Losartan 100 MG and Amlodipine.  Recommend she monitor BP at home at least as few mornings a week and document, report to provider if consistent elevation >130/90.  DASH diet focus.  LABS: CBC, CMP, urine ALB.  Return in 6 months.      Relevant Orders   CBC with Differential/Platelet     Digestive   Gastroesophageal reflux disease (Chronic)    Chronic, stable on Omeprazole.  Reports return of symptoms without medication, when has trialed GDR in past.  However,  recommend she try to reduce to lowest amount needed dosing. Mag level annually.  Risks of PPI use were discussed with patient including bone loss, C. Diff diarrhea, pneumonia, infections, CKD, electrolyte abnormalities.  Verbalizes understanding and chooses to continue the medication.         Endocrine   Secondary hyperparathyroidism (HCC)    Chronic, stable.  Continue to collaborate with nephrology.  PTH on labs.      Relevant Orders   Comprehensive metabolic panel   PTH, intact and calcium     Genitourinary   CKD (chronic kidney disease) stage 3, GFR 30-59 ml/min (HCC) - Primary    Ongoing, CKD 3a.  Continue Losartan for kidney protection.  CMP today.  Continue to collaborate with nephrology, appreciate their input. Will send labs obtained to them.      Relevant Orders   Comprehensive metabolic panel   Microalbumin, Urine Waived     Other   Hyperlipidemia    Chronic, ongoing.  Continue current medication regimen and adjust as needed.  Lipid panel today.      Relevant Orders   Comprehensive metabolic panel   Lipid Panel w/o Chol/HDL Ratio   Persistent proteinuria    Ongoing on labs with underlying CKD.  Continue  Losartan for kidney protection.  Urine ALB in office today.      Relevant Orders   Microalbumin, Urine Waived   Seizure (HCC)    Chronic, stable at this time.  Continue to collaborate with neurology and continue current medication regimen as prescribed by them.      Other Visit Diagnoses     Colon cancer screening       Cologuard ordered per patient request.  Last colonoscopy 10 years ago stable and she prefers not to return for this -- would prefer stool testing. Discussed.   Relevant Orders   Cologuard        Follow up plan: Return in about 6 months (around 10/29/2023) for Annual physical.

## 2023-04-28 NOTE — Assessment & Plan Note (Signed)
Chronic, ongoing.  Continue current medication regimen and adjust as needed. Lipid panel today. 

## 2023-04-28 NOTE — Assessment & Plan Note (Signed)
Chronic, stable.  Continue to collaborate with nephrology.  PTH on labs.

## 2023-04-28 NOTE — Assessment & Plan Note (Signed)
Ongoing on labs with underlying CKD.  Continue Losartan for kidney protection.  Urine ALB in office today.

## 2023-04-28 NOTE — Assessment & Plan Note (Signed)
Ongoing, CKD 3a.  Continue Losartan for kidney protection.  CMP today.  Continue to collaborate with nephrology, appreciate their input. Will send labs obtained to them.

## 2023-04-28 NOTE — Assessment & Plan Note (Signed)
Chronic, stable on Omeprazole.  Reports return of symptoms without medication, when has trialed GDR in past.  However, recommend she try to reduce to lowest amount needed dosing. Mag level annually.  Risks of PPI use were discussed with patient including bone loss, C. Diff diarrhea, pneumonia, infections, CKD, electrolyte abnormalities.  Verbalizes understanding and chooses to continue the medication.

## 2023-04-29 ENCOUNTER — Telehealth: Payer: Self-pay | Admitting: Nurse Practitioner

## 2023-04-29 LAB — COMPREHENSIVE METABOLIC PANEL
ALT: 27 IU/L (ref 0–32)
AST: 28 IU/L (ref 0–40)
Albumin/Globulin Ratio: 1.8 (ref 1.2–2.2)
Albumin: 4.8 g/dL (ref 3.9–4.9)
Alkaline Phosphatase: 115 IU/L (ref 44–121)
BUN/Creatinine Ratio: 18 (ref 12–28)
BUN: 26 mg/dL (ref 8–27)
Bilirubin Total: 0.2 mg/dL (ref 0.0–1.2)
CO2: 20 mmol/L (ref 20–29)
Calcium: 9.8 mg/dL (ref 8.7–10.3)
Chloride: 104 mmol/L (ref 96–106)
Creatinine, Ser: 1.48 mg/dL — ABNORMAL HIGH (ref 0.57–1.00)
Globulin, Total: 2.7 g/dL (ref 1.5–4.5)
Glucose: 110 mg/dL — ABNORMAL HIGH (ref 70–99)
Potassium: 5 mmol/L (ref 3.5–5.2)
Sodium: 141 mmol/L (ref 134–144)
Total Protein: 7.5 g/dL (ref 6.0–8.5)
eGFR: 38 mL/min/{1.73_m2} — ABNORMAL LOW (ref >59)

## 2023-04-29 LAB — LIPID PANEL W/O CHOL/HDL RATIO
Cholesterol, Total: 159 mg/dL (ref 100–199)
HDL: 55 mg/dL (ref >39)
LDL Chol Calc (NIH): 87 mg/dL (ref 0–99)
Triglycerides: 90 mg/dL (ref 0–149)
VLDL Cholesterol Cal: 17 mg/dL (ref 5–40)

## 2023-04-29 LAB — PTH, INTACT AND CALCIUM: PTH: 52 pg/mL (ref 15–65)

## 2023-04-29 LAB — CBC WITH DIFFERENTIAL/PLATELET
Basophils Absolute: 0 10*3/uL (ref 0.0–0.2)
Basos: 1 %
EOS (ABSOLUTE): 0.3 10*3/uL (ref 0.0–0.4)
Eos: 5 %
Hematocrit: 34.4 % (ref 34.0–46.6)
Hemoglobin: 11 g/dL — ABNORMAL LOW (ref 11.1–15.9)
Immature Grans (Abs): 0 10*3/uL (ref 0.0–0.1)
Immature Granulocytes: 0 %
Lymphocytes Absolute: 2 10*3/uL (ref 0.7–3.1)
Lymphs: 34 %
MCH: 29.9 pg (ref 26.6–33.0)
MCHC: 32 g/dL (ref 31.5–35.7)
MCV: 94 fL (ref 79–97)
Monocytes Absolute: 0.5 10*3/uL (ref 0.1–0.9)
Monocytes: 8 %
Neutrophils Absolute: 3.1 10*3/uL (ref 1.4–7.0)
Neutrophils: 52 %
Platelets: 241 10*3/uL (ref 150–450)
RBC: 3.68 x10E6/uL — ABNORMAL LOW (ref 3.77–5.28)
RDW: 12.7 % (ref 11.7–15.4)
WBC: 6 10*3/uL (ref 3.4–10.8)

## 2023-04-29 NOTE — Progress Notes (Signed)
Contacted via MyChart   Good evening Kelly Poole, your labs have returned: - Kidney function, creatinine and eGFR, continues to show Stage 3b kidney disease.  Continue your visits with nephrology, I know you have one upcoming.  Liver function, AST and ALT, is normal. - CBC shows mild low hemoglobin and normal hematocrit, stable. - PTH is normal range - Cholesterol levels look better.  Continue all current medications.  Any questions? Keep being amazing!!  Thank you for allowing me to participate in your care.  I appreciate you. Kindest regards, Jazelyn Sipe

## 2023-04-29 NOTE — Telephone Encounter (Signed)
PT called in checking for lab results, please call back when in

## 2023-04-29 NOTE — Progress Notes (Signed)
FYI for upcoming visit, I recently did her labs at visit.  Wanted to alert you before her visit with you:)

## 2023-05-17 DIAGNOSIS — Z1211 Encounter for screening for malignant neoplasm of colon: Secondary | ICD-10-CM | POA: Diagnosis not present

## 2023-05-24 LAB — COLOGUARD: COLOGUARD: NEGATIVE

## 2023-05-25 ENCOUNTER — Telehealth: Payer: Self-pay | Admitting: Nurse Practitioner

## 2023-05-25 NOTE — Progress Notes (Signed)
Contacted via MyChart   Cologuard remains negative!!  Woohoo!!  Repeat in 3 years:)

## 2023-05-25 NOTE — Telephone Encounter (Unsigned)
Copied from CRM [id number]. Topic: General - Other >> May 25, 2023 12:31 PM Ja-Kwan M wrote: Reason for CRM: Pt would like to know if she has to have another colonoscopy in 3 years or will this most recent one be the last time that she has to have it. Pt requests call back to advise. Cb# [phone]

## 2023-05-25 NOTE — Telephone Encounter (Signed)
Patient has recently done a cologuard on 05/17/23 and wants to know if she will ever have to repeat this

## 2023-05-27 ENCOUNTER — Telehealth: Payer: Self-pay

## 2023-05-27 NOTE — Progress Notes (Cosign Needed)
Care Management & Coordination Services Pharmacy Team  Reason for Encounter: General adherence update   Contacted patient for general health update and medication adherence call.  Spoke with patient on 05/31/2023    What concerns do you have about your medications?Patient has no concerns at this time with medications.  The patient reports hair has been coming out while taking seizure medication, but she is not concerned with it, side effects with their medications.   How often do you forget or accidentally miss a dose? Never  Do you use a pillbox? No  Are you having any problems getting your medications from your pharmacy? No  Has the cost of your medications been a concern? No,her medications does not cost her anything If yes, what medication and is patient assistance available or has it been applied for?  Since last visit with PharmD, no interventions have been made.   The patient has not had an ED visit since last contact.   The patient denies problems with their health.   Patient denies concerns or questions for , PharmD at this time.   Counseled patient on: Access to carecoordination team for any cost, medication or pharmacy concerns.   Chart Updates:  Recent office visits:  04/28/23 Aura Dials, NP (Kidney disease) Orders placed: Labs, Cologuard.Medication changes: none  Recent consult visits:  None since last coordination call  Hospital visits:  None in previous 6 months  Medications: Outpatient Encounter Medications as of 05/27/2023  Medication Sig Note   amLODipine (NORVASC) 2.5 MG tablet Take 2.5 mg by mouth daily.    Ascorbic Acid (VITAMIN C) 1000 MG tablet Take 1,000 mg by mouth daily.    aspirin 81 MG tablet Take 81 mg by mouth daily.    Cholecalciferol 50 MCG (2000 UT) TABS Take 1 tablet by mouth daily.    FEROSUL 325 (65 Fe) MG tablet TAKE 1 TABLET EVERY DAY WITH BREAKFAST    Garlic 1000 MG CAPS Take 1,000 mg by mouth daily. 10/12/2019: Doesn't take  regular   lamoTRIgine (LAMICTAL) 25 MG tablet Take 1 tablet by mouth at bedtime. 03/23/2022: Lamictal 25 mg once daily at night now    losartan (COZAAR) 100 MG tablet TAKE 1 TABLET EVERY DAY    Multiple Vitamin (MULTIVITAMINS PO) Take by mouth daily.    omeprazole (PRILOSEC) 20 MG capsule TAKE 1 CAPSULE EVERY DAY    rosuvastatin (CRESTOR) 40 MG tablet Take 1 tablet (40 mg total) by mouth daily.    vitamin E 100 UNIT capsule Take 100 Units by mouth daily.    No facility-administered encounter medications on file as of 05/27/2023.    Recent vitals BP Readings from Last 3 Encounters:  04/28/23 128/84  09/09/22 126/79  06/16/22 (!) 150/89   Pulse Readings from Last 3 Encounters:  04/28/23 69  09/09/22 65  06/16/22 71   Wt Readings from Last 3 Encounters:  04/28/23 151 lb 1.6 oz (68.5 kg)  09/09/22 152 lb (68.9 kg)  06/16/22 152 lb (68.9 kg)   BMI Readings from Last 3 Encounters:  04/28/23 35.12 kg/m  09/09/22 35.33 kg/m  06/16/22 34.10 kg/m    Recent lab results    Component Value Date/Time   NA 141 04/28/2023 1024   K 5.0 04/28/2023 1024   CL 104 04/28/2023 1024   CO2 20 04/28/2023 1024   GLUCOSE 110 (H) 04/28/2023 1024   GLUCOSE 88 12/26/2020 0425   BUN 26 04/28/2023 1024   CREATININE 1.48 (H) 04/28/2023 1024   CALCIUM  9.8 04/28/2023 1024    Lab Results  Component Value Date   CREATININE 1.48 (H) 04/28/2023   EGFR 38 (L) 04/28/2023   GFRNONAA 42 (L) 12/26/2020   GFRAA 45 (L) 07/30/2020   Lab Results  Component Value Date/Time   MICROALBUR 150 (H) 04/28/2023 10:22 AM   MICROALBUR 150 (H) 03/25/2022 09:01 AM    Lab Results  Component Value Date   CHOL 159 04/28/2023   HDL 55 04/28/2023   LDLCALC 87 04/28/2023   TRIG 90 04/28/2023   CHOLHDL 2.8 08/23/2018    Care Gaps: Annual wellness visit in last year? Yes, 10/22/22  If Diabetic: Last eye exam / retinopathy screening:03/12/23 Last diabetic foot exam:NA Last UACR: NA  Star Rating Drugs:   Medication:  Last Fill: Day Supply Losartan 100 mg 04/03/23, 01/25/23 90 Rosuvastatin 40 mg 04/08/23, 01/25/23 90  Velvet Bathe

## 2023-06-08 ENCOUNTER — Ambulatory Visit: Payer: Medicare PPO | Admitting: Nurse Practitioner

## 2023-06-08 DIAGNOSIS — N898 Other specified noninflammatory disorders of vagina: Secondary | ICD-10-CM

## 2023-07-09 ENCOUNTER — Encounter: Payer: Self-pay | Admitting: Pharmacist

## 2023-07-09 NOTE — Progress Notes (Signed)
Patient previously followed by [facility] pharmacist. Per clinical review, no pharmacist appointment needed at this time.

## 2023-07-12 DIAGNOSIS — R9082 White matter disease, unspecified: Secondary | ICD-10-CM | POA: Diagnosis not present

## 2023-07-12 DIAGNOSIS — R569 Unspecified convulsions: Secondary | ICD-10-CM | POA: Diagnosis not present

## 2023-08-12 DIAGNOSIS — R569 Unspecified convulsions: Secondary | ICD-10-CM | POA: Diagnosis not present

## 2023-08-22 ENCOUNTER — Other Ambulatory Visit: Payer: Self-pay | Admitting: Nurse Practitioner

## 2023-08-24 NOTE — Telephone Encounter (Signed)
Requested Prescriptions  Pending Prescriptions Disp Refills   omeprazole (PRILOSEC) 20 MG capsule [Pharmacy Med Name: OMEPRAZOLE 20 MG Capsule Delayed Release] 90 capsule 3    Sig: TAKE 1 CAPSULE EVERY DAY     Gastroenterology: Proton Pump Inhibitors Passed - 08/22/2023  2:10 AM      Passed - Valid encounter within last 12 months    Recent Outpatient Visits           3 months ago Stage 3b chronic kidney disease (HCC)   [facility] Cannady, Dorie Rank, NP   11 months ago Seizure [facility])   [facility] [place], Corrie Dandy T, NP   1 year ago Allergic contact dermatitis due to plants, except food   [facility] Gabriel Cirri, NP   1 year ago Seizure [facility])   [facility] [place], Corrie Dandy T, NP   1 year ago Seizure [facility])   [facility] Marjie Skiff, NP       Future Appointments             In 2 months Cannady, Dorie Rank, NP  [facility]

## 2023-10-24 NOTE — Patient Instructions (Signed)
Be Involved in Caring For Your Health:  Taking Medications When medications are taken as directed, they can greatly improve your health. But if they are not taken as prescribed, they may not work. In some cases, not taking them correctly can be harmful. To help ensure your treatment remains effective and safe, understand your medications and how to take them. Bring your medications to each visit for review by your provider.  Your lab results, notes, and after visit summary will be available on My Chart. We strongly encourage you to use this feature. If lab results are abnormal the clinic will contact you with the appropriate steps. If the clinic does not contact you assume the results are satisfactory. You can always view your results on My Chart. If you have questions regarding your health or results, please contact the clinic during office hours. You can also ask questions on My Chart.  We at [facility] are grateful that you chose [place] to provide your care. We strive to provide evidence-based and compassionate care and are always looking for feedback. If you get a survey from the clinic please complete this so we can hear your opinions.  Food Basics for Chronic Kidney Disease Chronic kidney disease (CKD) is when your kidneys are not working well. They cannot remove waste, fluids, and other substances from your blood the way they should. These substances can build up, which can worsen kidney damage and affect how your body works. Eating certain foods can lead to a buildup of these substances. Changing your diet can help prevent more kidney damage. Diet changes may also delay dialysis or even keep you from needing it. What nutrients should I limit? Work with your treatment team and a food expert (dietitian) to make a meal plan that's right for you. Foods you can eat and foods you should limit or avoid will depend on the stage of your kidney disease and any other health conditions you have.  The items listed below are not a complete list. Talk with your dietitian to learn what is best for you. Potassium Potassium affects how steadily your heart beats. Too much potassium in your blood can cause an irregular heartbeat or even a heart attack. You may need to limit foods that are high in potassium, such as: Liquid milk and soy milk. Salt substitutes that contain potassium. Fruits like bananas, apricots, nectarines, melon, prunes, raisins, kiwi, and oranges. Vegetables, such as potatoes, sweet potatoes, yams, tomatoes, leafy greens, beets, avocado, pumpkin, and winter squash. Beans, like lima beans. Nuts. Phosphorus Phosphorus is a mineral found in your bones. You need a balance between calcium and phosphorus to build and maintain healthy bones. Too much added phosphorus from the foods you eat can pull calcium from your bones. Losing calcium can make your bones weak and more likely to break. Too much phosphorus can also make your skin itch. You may need to limit foods that are high in phosphorus or that have added phosphorus, such as: Liquid milk and dairy products. Dark-colored sodas or soft drinks. Bran cereals and oatmeal. Protein  Protein helps you make and keep muscle. Protein also helps to repair your body's cells and tissues. One of the natural breakdown products of protein is a waste product called urea. When your kidneys are not working well, they cannot remove types of waste like urea. Reducing protein in your diet can help keep urea from building up in your blood. Depending on your stage of kidney disease, you may need to  eat smaller portions of foods that are high in protein. Sources of animal protein include: Meat (all types). Fish and seafood. Poultry. Eggs. Dairy. Other protein foods include: Beans and legumes. Nuts and nut butter. Soy, like tofu.  Sodium Salt (sodium) helps to keep a healthy balance of fluids in your body. Too much salt can increase your blood  pressure, which can harm your heart and lungs. Extra salt can also cause your body to keep too much fluid, making your kidneys work harder. You may need to limit or avoid foods that are high in salt, such as: Salt seasonings. Soy and teriyaki sauce. Packaged, precooked, cured, or processed meats, such as sausages or meat loaves. Sardines. Salted crackers and snack foods. Fast food. Canned soups and most canned foods. Pickled foods. Vegetable juice. Boxed mixes or ready-to-eat boxed meals and side dishes. Bottled dressings, sauces, and marinades. Talk with your dietitian about how much potassium, phosphorus, protein, and salt you may have each day. Helpful tips Read food labels  Check the amount of salt in foods. Limit foods that have salt or sodium listed among the first five ingredients. Try to eat low-salt foods. Check the ingredient list for added phosphorus or potassium. "Phos" in an ingredient is a sign that phosphorus has been added. Do not buy foods that are calcium-enriched or that have calcium added to them (are fortified). Buy canned vegetables and beans that say "no salt added" and rinse them before eating. Lifestyle Limit the amount of protein you eat from animal sources each day. Focus on protein from plant sources, like tofu and dried beans, peas, and lentils. Do not add salt to food when cooking or before eating. Do not eat star fruit. It can be toxic for people with kidney problems. Talk with your health care provider before taking any vitamin or mineral supplements. If told by your health care provider, track how much liquid you drink so you can avoid drinking too much. You may need to include foods you eat that are made mostly from water, like gelatin, ice cream, soups, and juicy fruits and vegetables. If you have diabetes: If you have diabetes (diabetes mellitus) and CKD, you need to keep your blood sugar (glucose) in the target range recommended by your health care  provider. Follow your diabetes management plan. This may include: Checking your blood glucose regularly. Taking medicines by mouth, or taking insulin, or both. Exercising for at least 30 minutes on 5 or more days each week, or as told by your health care provider. Tracking how many servings of carbohydrates you eat at each meal. Not using orange juice to treat low blood sugars. Instead, use apple juice, cranberry juice, or clear soda. You may be given guidelines on what foods and nutrients you may eat, and how much you can have each day. This depends on your stage of kidney disease and whether you have high blood pressure (hypertension). Follow the meal plan your dietitian gives you. To learn more: [employer] of Diabetes and Digestive and Kidney Diseases: [employer]: kidney.org Summary Chronic kidney disease (CKD) is when your kidneys are not working well. They cannot remove waste, fluids, and other substances from your blood the way they should. These substances can build up, which can worsen kidney damage and affect how your body works. Changing your diet can help prevent more kidney damage. Diet changes may also delay dialysis or even keep you from needing it. Diet changes are different for each person with CKD.  Work with a dietitian to set up a meal plan that is right for you. This information is not intended to replace advice given to you by your health care provider. Make sure you discuss any questions you have with your health care provider. Document Revised: 03/26/2022 Document Reviewed: 04/01/2020 Elsevier Patient Education  2024 [employer].

## 2023-10-28 ENCOUNTER — Telehealth: Payer: Self-pay | Admitting: *Deleted

## 2023-10-28 ENCOUNTER — Encounter: Payer: Self-pay | Admitting: Nurse Practitioner

## 2023-10-28 ENCOUNTER — Ambulatory Visit (INDEPENDENT_AMBULATORY_CARE_PROVIDER_SITE_OTHER): Payer: Medicare PPO | Admitting: Nurse Practitioner

## 2023-10-28 VITALS — BP 124/78 | HR 74 | Temp 98.2°F | Ht <= 58 in | Wt 151.2 lb

## 2023-10-28 DIAGNOSIS — Z5948 Other specified lack of adequate food: Secondary | ICD-10-CM

## 2023-10-28 DIAGNOSIS — E049 Nontoxic goiter, unspecified: Secondary | ICD-10-CM

## 2023-10-28 DIAGNOSIS — N1832 Chronic kidney disease, stage 3b: Secondary | ICD-10-CM

## 2023-10-28 DIAGNOSIS — I10 Essential (primary) hypertension: Secondary | ICD-10-CM | POA: Diagnosis not present

## 2023-10-28 DIAGNOSIS — E7800 Pure hypercholesterolemia, unspecified: Secondary | ICD-10-CM

## 2023-10-28 DIAGNOSIS — R569 Unspecified convulsions: Secondary | ICD-10-CM

## 2023-10-28 DIAGNOSIS — Z8639 Personal history of other endocrine, nutritional and metabolic disease: Secondary | ICD-10-CM

## 2023-10-28 DIAGNOSIS — M8588 Other specified disorders of bone density and structure, other site: Secondary | ICD-10-CM | POA: Diagnosis not present

## 2023-10-28 DIAGNOSIS — Z853 Personal history of malignant neoplasm of breast: Secondary | ICD-10-CM

## 2023-10-28 DIAGNOSIS — F5101 Primary insomnia: Secondary | ICD-10-CM

## 2023-10-28 DIAGNOSIS — N2581 Secondary hyperparathyroidism of renal origin: Secondary | ICD-10-CM

## 2023-10-28 DIAGNOSIS — D518 Other vitamin B12 deficiency anemias: Secondary | ICD-10-CM

## 2023-10-28 DIAGNOSIS — Z0000 Encounter for general adult medical examination without abnormal findings: Secondary | ICD-10-CM

## 2023-10-28 DIAGNOSIS — D631 Anemia in chronic kidney disease: Secondary | ICD-10-CM | POA: Diagnosis not present

## 2023-10-28 DIAGNOSIS — K219 Gastro-esophageal reflux disease without esophagitis: Secondary | ICD-10-CM

## 2023-10-28 DIAGNOSIS — E785 Hyperlipidemia, unspecified: Secondary | ICD-10-CM | POA: Diagnosis not present

## 2023-10-28 DIAGNOSIS — Z599 Problem related to housing and economic circumstances, unspecified: Secondary | ICD-10-CM

## 2023-10-28 NOTE — Assessment & Plan Note (Signed)
Chronic, ongoing.  Continue current medication regimen and adjust as needed. Lipid panel today. 

## 2023-10-28 NOTE — Assessment & Plan Note (Signed)
Ongoing, CKD 3b.  Continue Losartan for kidney protection.  CMP today.  Continue to collaborate with nephrology, appreciate their input. Recommend she increase her water intake daily.  Avoid Ibuprofen.

## 2023-10-28 NOTE — Progress Notes (Signed)
  Care Coordination   Note   10/28/2023 Name: Kelly Poole MRN: [id number] DOB: December 09, 1951  Kelly Poole is a 72 y.o. year old female who sees [place], Corrie Dandy T, NP for primary care. I reached out to Phineas Inches by phone today to offer care coordination services.  [name] was given information about Care Coordination services today including:   The Care Coordination services include support from the care team which includes your Nurse Coordinator, Clinical Social Worker, or Pharmacist.  The Care Coordination team is here to help remove barriers to the health concerns and goals most important to you. Care Coordination services are voluntary, and the patient may decline or stop services at any time by request to their care team member.   Care Coordination Consent Status: Patient agreed to services and verbal consent obtained.   Follow up plan:  Telephone appointment with care coordination team member scheduled for:  11/04/2023  Encounter Outcome:  Patient Scheduled from referral   Burman Nieves, [facility] Care Coordination Care Guide Direct Dial: [phone]

## 2023-10-28 NOTE — Assessment & Plan Note (Signed)
Chronic, stable.  Continue to collaborate with nephrology.  Scheduled to see nephrology in December 2024.

## 2023-10-28 NOTE — Progress Notes (Signed)
BP 124/78 (BP Location: Left Arm, Patient Position: Sitting, Cuff Size: Normal)   Pulse 74   Temp 98.2 F (36.8 C) (Oral)   Ht 4' 9.2" (1.453 m)   Wt 151 lb 3.2 oz (68.6 kg)   SpO2 97%   BMI 32.49 kg/m    Subjective:    Patient ID: Kelly Poole, female    DOB: 1951/10/04, 72 y.o.   MRN: [id number]  HPI: Kelly Poole is a 72 y.o. female presenting on 10/28/2023 for Medicare Wellness, Annual Physical, and Follow-up. Current medical complaints include:none  She currently lives with: self Menopausal Symptoms: no   HYPERTENSION / HYPERLIPIDEMIA Taking Losartan 100 MG daily and Rosuvastatin 40 MG daily for HLD.  Can not take Amlodipine as it made her feel bad.  Follows with neuro for past seizure issues.  Seen on 07/12/23 last and they performed EEG which was stable and stopped her Lamictal.  She is to return to see them 02/15/24. Satisfied with current treatment? yes Duration of hypertension: chronic BP monitoring frequency: not checking - high today, but is stressing out this morning due to a friendship BP range:  BP medication side effects: no Duration of hyperlipidemia: chronic Cholesterol medication side effects: no Cholesterol supplements: none Medication compliance: good compliance Aspirin: yes Recent stressors: no Recurrent headaches: no Visual changes: no Palpitations: no Dyspnea: no Chest pain: no Lower extremity edema: no Dizzy/lightheaded: no  The 10-year ASCVD risk score (Arnett DK, et al., 2019) is: 9.2%   Values used to calculate the score:     Age: 13 years     Sex: Female     Is Non-Hispanic African American: Yes     Diabetic: No     Tobacco smoker: No     Systolic Blood Pressure: 124 mmHg     Is BP treated: Yes     HDL Cholesterol: 55 mg/dL     Total Cholesterol: 159 mg/dL  GERD Continues on Prilosec 20 MG daily. History of thyroid goiter. GERD control status: stable  Satisfied with current treatment? yes Heartburn frequency:  none Medication side effects: no  Medication compliance: stable Previous GERD medications: OTC medications Antacid use frequency:  none Dysphagia: no Odynophagia:  no Hematemesis: no Blood in stool: no EGD: no  CHRONIC KIDNEY DISEASE (CKD 3b) Has not seen nephrology since 05/20/22.  She is scheduled to see them December 11th.   Continues on Ferosul and multivitamin daily & B12 for past anemia in CKD. Was followed by hematology for this with last visit 06/02/22 to return PRN. CKD status: stable Medications renally dose: no Previous renal evaluation: no Pneumovax:   refused Influenza Vaccine:  refused  INSOMNIA Continues to have issues with sleeping, takes Melatonin.  This allows her to sleep until 4 am, but then gets up to go to bathroom and then can not get back to sleep.  Taking 5 MG. Duration: chronic Satisfied with sleep quality: no Difficulty falling asleep: yes Difficulty staying asleep: no Waking a few hours after sleep onset: no Early morning awakenings: no Daytime hypersomnolence: no Wakes feeling refreshed: yes Good sleep hygiene: yes Apnea: no Snoring: no Depressed/anxious mood: no Recent stress: no Restless legs/nocturnal leg cramps: no Chronic pain/arthritis: no History of sleep study: no Treatments attempted: melatonin, Trazodone  OSTEOPENIA: Takes daily Vitamin D supplement too, no recent falls or fractures.  DEXA scan done 01/01/22 with T-score -2.3.   Satisfied with current treatment?: yes Adequate calcium & vitamin D: yes Weight bearing exercises: yes  A voluntary discussion about advance care planning including the explanation and discussion of advance directives was extensively discussed  with the patient for 15 minutes with patient.  Explanation about the health care proxy and Living will was reviewed and packet with forms with explanation of how to fill them out was given.  During this discussion, the patient was able to identify a health care proxy  as her son and plans to fill out the paperwork required.  Patient was offered a separate Advance Care Planning visit for further assistance with forms.     Depression Screen done today and results listed below:     10/28/2023    8:50 AM 04/28/2023    9:48 AM 10/22/2022    8:44 AM 09/09/2022    8:15 AM 06/16/2022    8:22 AM  Depression screen PHQ 2/9  Decreased Interest 2 0 0 0 0  Down, Depressed, Hopeless 0 0 0 0 0  PHQ - 2 Score 2 0 0 0 0  Altered sleeping 3 3 2 3 3   Tired, decreased energy 1 0 0 3 2  Change in appetite 0 0 0 0 2  Feeling bad or failure about yourself  0 0 0 0 0  Trouble concentrating 3 0 0 0 0  Moving slowly or fidgety/restless 1 0 0 0 0  Suicidal thoughts 0 0 0 0 0  PHQ-9 Score 10 3 2 6 7   Difficult doing work/chores Not difficult at all Not difficult at all Not difficult at all Not difficult at all Somewhat difficult      10/28/2023    8:50 AM 04/28/2023    9:48 AM 09/09/2022    8:16 AM 06/16/2022    8:22 AM  GAD 7 : Generalized Anxiety Score  Nervous, Anxious, on Edge 2 0 0 0  Control/stop worrying 0 0 0 0  Worry too much - different things 0 0 0 0  Trouble relaxing 1 0 2 2  Restless 0 0 0 0  Easily annoyed or irritable 1 1 0 0  Afraid - awful might happen 0 0 0 0  Total GAD 7 Score 4 1 2 2   Anxiety Difficulty Not difficult at all Not difficult at all Not difficult at all Somewhat difficult      10/28/2023    8:50 AM 04/28/2023    9:47 AM 10/22/2022    8:35 AM 09/09/2022    8:15 AM 06/16/2022    8:21 AM  Fall Risk   Falls in the past year? 0 0 0 0 0  Number falls in past yr: 0 0 0 0 0  Injury with Fall? 0 0 0 0 0  Risk for fall due to : No Fall Risks No Fall Risks  No Fall Risks No Fall Risks  Follow up Falls evaluation completed Falls evaluation completed Falls evaluation completed;Education provided;Falls prevention discussed Falls evaluation completed Falls evaluation completed    Functional Status Survey: Is the patient deaf or have difficulty  hearing?: No Does the patient have difficulty seeing, even when wearing glasses/contacts?: No Does the patient have difficulty concentrating, remembering, or making decisions?: No Does the patient have difficulty walking or climbing stairs?: No Does the patient have difficulty dressing or bathing?: No Does the patient have difficulty doing errands alone such as visiting a doctor's office or shopping?: No    Past Medical History:  Past Medical History:  Diagnosis Date   Breast cancer (HCC) 1991   left breast mastectomy. No chemo or  rad tx   GERD (gastroesophageal reflux disease)    Heart murmur    High cholesterol    Hypertension    Seizures (HCC)     Surgical History:  Past Surgical History:  Procedure Laterality Date   ABDOMINAL HYSTERECTOMY  1978   MASTECTOMY Left 1991   Due to breast cancer   TUBAL LIGATION      Medications:  Current Outpatient Medications on File Prior to Visit  Medication Sig   Ascorbic Acid (VITAMIN C) 1000 MG tablet Take 1,000 mg by mouth daily.   aspirin 81 MG tablet Take 81 mg by mouth daily.   Cholecalciferol 50 MCG (2000 UT) TABS Take 1 tablet by mouth daily.   FEROSUL 325 (65 Fe) MG tablet TAKE 1 TABLET EVERY DAY WITH BREAKFAST   Garlic 1000 MG CAPS Take 1,000 mg by mouth daily.   losartan (COZAAR) 100 MG tablet TAKE 1 TABLET EVERY DAY   Multiple Vitamin (MULTIVITAMINS PO) Take by mouth daily.   omeprazole (PRILOSEC) 20 MG capsule TAKE 1 CAPSULE EVERY DAY   rosuvastatin (CRESTOR) 40 MG tablet Take 1 tablet (40 mg total) by mouth daily.   vitamin E 100 UNIT capsule Take 100 Units by mouth daily.   No current facility-administered medications on file prior to visit.    Allergies:  Allergies  Allergen Reactions   Macrobid [Nitrofurantoin Macrocrystal] Other (See Comments)    Sore throat and irritation to throat    Social History:  Social History   Socioeconomic History   Marital status: Single    Spouse name: Not on file   Number of  children: Not on file   Years of education: Not on file   Highest education level: Not on file  Occupational History   Not on file  Tobacco Use   Smoking status: Never   Smokeless tobacco: Never  Vaping Use   Vaping status: Never Used  Substance and Sexual Activity   Alcohol use: No    Alcohol/week: 0.0 standard drinks of alcohol   Drug use: No   Sexual activity: Yes  Other Topics Concern   Not on file  Social History Narrative   Not on file   Social Determinants of Health   Financial Resource Strain: Medium Risk (10/28/2023)   Overall Financial Resource Strain (CARDIA)    Difficulty of Paying Living Expenses: Somewhat hard  Food Insecurity: Food Insecurity Present (10/28/2023)   Hunger Vital Sign    Worried About Running Out of Food in the Last Year: Sometimes true    Ran Out of Food in the Last Year: Sometimes true  Transportation Needs: No Transportation Needs (10/28/2023)   PRAPARE - Administrator, Civil Service (Medical): No    Lack of Transportation (Non-Medical): No  Physical Activity: Sufficiently Active (10/28/2023)   Exercise Vital Sign    Days of Exercise per Week: 2 days    Minutes of Exercise per Session: 90 min  Stress: Stress Concern Present (10/28/2023)   [employer] of Occupational Health - Occupational Stress Questionnaire    Feeling of Stress : To some extent  Social Connections: Unknown (10/28/2023)   Social Connection and Isolation Panel [NHANES]    Frequency of Communication with Friends and Family: Twice a week    Frequency of Social Gatherings with Friends and Family: Once a week    Attends Religious Services: Never    Database administrator or Organizations: Yes    Attends Banker Meetings: Never  Marital Status: Not on file  Intimate Partner Violence: Not At Risk (10/28/2023)   Humiliation, Afraid, Rape, and Kick questionnaire    Fear of Current or Ex-Partner: No    Emotionally Abused: No    Physically Abused: No     Sexually Abused: No   Social History   Tobacco Use  Smoking Status Never  Smokeless Tobacco Never   Social History   Substance and Sexual Activity  Alcohol Use No   Alcohol/week: 0.0 standard drinks of alcohol    Family History:  Family History  Problem Relation Age of Onset   Stroke Mother    Thyroid disease Mother    Cancer Father    Diabetes Maternal Aunt    Breast cancer Other     Past medical history, surgical history, medications, allergies, family history and social history reviewed with patient today and changes made to appropriate areas of the chart.   Review of Systems - negative All other ROS negative except what is listed above and in the HPI.      Objective:    BP 124/78 (BP Location: Left Arm, Patient Position: Sitting, Cuff Size: Normal)   Pulse 74   Temp 98.2 F (36.8 C) (Oral)   Ht 4' 9.2" (1.453 m)   Wt 151 lb 3.2 oz (68.6 kg)   SpO2 97%   BMI 32.49 kg/m   Wt Readings from Last 3 Encounters:  10/28/23 151 lb 3.2 oz (68.6 kg)  04/28/23 151 lb 1.6 oz (68.5 kg)  09/09/22 152 lb (68.9 kg)    Physical Exam Vitals and nursing note reviewed. Exam conducted with a chaperone present.  Constitutional:      General: She is awake. She is not in acute distress.    Appearance: Normal appearance. She is well-developed and well-groomed. She is obese. She is not ill-appearing or toxic-appearing.  HENT:     Head: Normocephalic and atraumatic.     Right Ear: Hearing, tympanic membrane, ear canal and external ear normal. No drainage.     Left Ear: Hearing, tympanic membrane, ear canal and external ear normal. No drainage.     Nose: Nose normal.     Right Sinus: No maxillary sinus tenderness or frontal sinus tenderness.     Left Sinus: No maxillary sinus tenderness or frontal sinus tenderness.     Mouth/Throat:     Mouth: Mucous membranes are moist.     Pharynx: Oropharynx is clear. Uvula midline. No pharyngeal swelling, oropharyngeal exudate or posterior  oropharyngeal erythema.  Eyes:     General: Lids are normal.        Right eye: No discharge.        Left eye: No discharge.     Extraocular Movements: Extraocular movements intact.     Conjunctiva/sclera: Conjunctivae normal.     Pupils: Pupils are equal, round, and reactive to light.     Visual Fields: Right eye visual fields normal and left eye visual fields normal.  Neck:     Thyroid: No thyromegaly.     Vascular: No carotid bruit.     Trachea: Trachea normal.  Cardiovascular:     Rate and Rhythm: Normal rate and regular rhythm.     Heart sounds: Normal heart sounds. No murmur heard.    No gallop.  Pulmonary:     Effort: Pulmonary effort is normal. No accessory muscle usage or respiratory distress.     Breath sounds: Normal breath sounds.  Chest:  Breasts:    Right: Normal.  Left: Normal.  Abdominal:     General: Bowel sounds are normal.     Palpations: Abdomen is soft. There is no hepatomegaly or splenomegaly.     Tenderness: There is no abdominal tenderness.  Musculoskeletal:        General: Normal range of motion.     Cervical back: Normal range of motion and neck supple.     Right lower leg: No edema.     Left lower leg: No edema.  Lymphadenopathy:     Head:     Right side of head: No submental, submandibular, tonsillar, preauricular or posterior auricular adenopathy.     Left side of head: No submental, submandibular, tonsillar, preauricular or posterior auricular adenopathy.     Cervical: No cervical adenopathy.     Upper Body:     Right upper body: No supraclavicular, axillary or pectoral adenopathy.     Left upper body: No supraclavicular, axillary or pectoral adenopathy.  Skin:    General: Skin is warm and dry.     Capillary Refill: Capillary refill takes less than 2 seconds.     Findings: No rash.  Neurological:     Mental Status: She is alert and oriented to person, place, and time.     Gait: Gait is intact.     Deep Tendon Reflexes: Reflexes are  normal and symmetric.     Reflex Scores:      Brachioradialis reflexes are 2+ on the right side and 2+ on the left side.      Patellar reflexes are 2+ on the right side and 2+ on the left side. Psychiatric:        Attention and Perception: Attention normal.        Mood and Affect: Mood normal.        Speech: Speech normal.        Behavior: Behavior normal. Behavior is cooperative.        Thought Content: Thought content normal.        Judgment: Judgment normal.       10/28/2023    8:44 AM 10/22/2022    8:36 AM 10/20/2021   11:19 AM 10/14/2020    3:26 PM 10/12/2019    3:23 PM  6CIT Screen  What Year? 0 points 0 points 0 points 0 points 0 points  What month? 0 points 0 points 0 points 0 points 0 points  What time? 0 points 0 points 0 points 0 points 0 points  Count back from 20 2 points 2 points 2 points 0 points 0 points  Months in reverse 0 points 2 points 4 points 0 points 0 points  Repeat phrase 2 points 0 points 6 points 0 points 2 points  Total Score 4 points 4 points 12 points 0 points 2 points    Results for orders placed or performed in visit on 04/28/23  Comprehensive metabolic panel  Result Value Ref Range   Glucose 110 (H) 70 - 99 mg/dL   BUN 26 8 - 27 mg/dL   Creatinine, Ser 8.65 (H) 0.57 - 1.00 mg/dL   eGFR 38 (L) >78 IO/NGE/9.52   BUN/Creatinine Ratio 18 12 - 28   Sodium 141 134 - 144 mmol/L   Potassium 5.0 3.5 - 5.2 mmol/L   Chloride 104 96 - 106 mmol/L   CO2 20 20 - 29 mmol/L   Calcium 9.8 8.7 - 10.3 mg/dL   Total Protein 7.5 6.0 - 8.5 g/dL   Albumin 4.8 3.9 - 4.9 g/dL  Globulin, Total 2.7 1.5 - 4.5 g/dL   Albumin/Globulin Ratio 1.8 1.2 - 2.2   Bilirubin Total 0.2 0.0 - 1.2 mg/dL   Alkaline Phosphatase 115 44 - 121 IU/L   AST 28 0 - 40 IU/L   ALT 27 0 - 32 IU/L  Lipid Panel w/o Chol/HDL Ratio  Result Value Ref Range   Cholesterol, Total 159 100 - 199 mg/dL   Triglycerides 90 0 - 149 mg/dL   HDL 55 >38 mg/dL   VLDL Cholesterol Cal 17 5 - 40 mg/dL    LDL Chol Calc (NIH) 87 0 - 99 mg/dL  CBC with Differential/Platelet  Result Value Ref Range   WBC 6.0 3.4 - 10.8 x10E3/uL   RBC 3.68 (L) 3.77 - 5.28 x10E6/uL   Hemoglobin 11.0 (L) 11.1 - 15.9 g/dL   Hematocrit 75.6 43.3 - 46.6 %   MCV 94 79 - 97 fL   MCH 29.9 26.6 - 33.0 pg   MCHC 32.0 31.5 - 35.7 g/dL   RDW 29.5 18.8 - 41.6 %   Platelets 241 150 - 450 x10E3/uL   Neutrophils 52 Not Estab. %   Lymphs 34 Not Estab. %   Monocytes 8 Not Estab. %   Eos 5 Not Estab. %   Basos 1 Not Estab. %   Neutrophils Absolute 3.1 1.4 - 7.0 x10E3/uL   Lymphocytes Absolute 2.0 0.7 - 3.1 x10E3/uL   Monocytes Absolute 0.5 0.1 - 0.9 x10E3/uL   EOS (ABSOLUTE) 0.3 0.0 - 0.4 x10E3/uL   Basophils Absolute 0.0 0.0 - 0.2 x10E3/uL   Immature Granulocytes 0 Not Estab. %   Immature Grans (Abs) 0.0 0.0 - 0.1 x10E3/uL  Microalbumin, Urine Waived  Result Value Ref Range   Microalb, Ur Waived 150 (H) 0 - 19 mg/L   Creatinine, Urine Waived 100 10 - 300 mg/dL   Microalb/Creat Ratio 30-300 (H) <30 mg/g  PTH, intact and calcium  Result Value Ref Range   PTH 52 15 - 65 pg/mL   PTH Interp Comment   Cologuard  Result Value Ref Range   COLOGUARD Negative Negative      Assessment & Plan:   Problem List Items Addressed This Visit       Cardiovascular and Mediastinum   Hypertension    Chronic, stable.  BP at goal for age on recheck. Will continue Losartan 100 MG.  Recommend she monitor BP at home at least as few mornings a week and document, report to provider if consistent elevation >130/90.  DASH diet focus.  LABS: CBC, CMP, TSH.          Digestive   Gastroesophageal reflux disease (Chronic)    Chronic, stable on Omeprazole.  Reports return of symptoms without medication, when has trialed GDR in past.  However, recommend she try to reduce to lowest amount needed dosing. Mag level annually.  Risks of PPI use were discussed with patient including bone loss, C. Diff diarrhea, pneumonia, infections, CKD,  electrolyte abnormalities.  Verbalizes understanding and chooses to continue the medication.       Relevant Orders   Magnesium     Endocrine   Secondary hyperparathyroidism (HCC)    Chronic, stable.  Continue to collaborate with nephrology.  Scheduled to see nephrology in December 2024.      Thyroid goiter    History of reported by patient.  Check TSH, Free T4 today.      Relevant Orders   T4, free   TSH     Musculoskeletal and  Integument   Osteopenia of lumbar spine    Ongoing.  Noted on DEXA 01/01/22.  At this time continue Vitamin D supplement daily and adjust as needed.  Plan on repeat DEXA around 01/01/27.      Relevant Orders   VITAMIN D 25 Hydroxy (Vit-D Deficiency, Fractures)     Genitourinary   CKD (chronic kidney disease) stage 3, GFR 30-59 ml/min (HCC)    Ongoing, CKD 3b.  Continue Losartan for kidney protection.  CMP today.  Continue to collaborate with nephrology, appreciate their input. Recommend she increase her water intake daily.  Avoid Ibuprofen.        Other   Anemia    Chronic, ongoing.  Continue daily iron supplement.  Recheck iron, B12, and CBC today.  Return to hematology as needed.      Relevant Orders   Iron   Ferritin   CBC with Differential/Platelet   HX: breast cancer    Mammogram up to date and remains reassuring.      Hyperlipidemia    Chronic, ongoing.  Continue current medication regimen and adjust as needed.  Lipid panel today.      Relevant Orders   Comprehensive metabolic panel   Lipid Panel w/o Chol/HDL Ratio   Insomnia    Chronic, stable.  Recommend she continue Melatonin every night and may take 10 MG nightly.      Seizure (HCC)    Chronic, stable at this time.  Continue to collaborate with neurology, is now off Lamictal per neurology.      Vitamin B12 deficiency (dietary) anemia    Chronic, stable.  No current supplement as levels have been stable. Check B12 level today and adjust regimen as needed.      Relevant  Orders   CBC with Differential/Platelet   Vitamin B12   Other Visit Diagnoses     Medicare annual wellness visit, subsequent    -  Primary   Medicare wellness due today and performed with patient.   Financial difficulties       Referral to SW for assist with bills if possible.  Discussed with patient.   Relevant Orders   AMB Referral [facility] Care Management   Lack of food       Food bag provided today to take home.  Referral to SW placed.   Relevant Orders   AMB Referral [facility] Care Management   Encounter for annual physical exam       Annual physical today with labs and health maintenance reviewed, discussed with patient.        Follow up plan: Return in about 6 months (around 04/26/2024) for HTN/HLD, CKD, MOOD.   LABORATORY TESTING:  - Pap smear: not applicable  IMMUNIZATIONS: is concerned about having seizures with vaccines due to her past experience with this - Tdap: Tetanus vaccination status reviewed: Refused. - Influenza: Refused - Pneumovax: Refused - Prevnar: Refused - HPV: Not applicable - Zostavax vaccine: Refused  SCREENING: -Mammogram: Up to date -- last 03/26/23 - Colonoscopy: Up to date - Cologuard due next 05/16/26 - Bone Density: Up To Date on 01/01/22 -Hearing Test: Not applicable  -Spirometry: Not applicable   PATIENT COUNSELING:   Advised to take 1 mg of folate supplement per day if capable of pregnancy.   Sexuality: Discussed sexually transmitted diseases, partner selection, use of condoms, avoidance of unintended pregnancy  and contraceptive alternatives.   Advised to avoid cigarette smoking.  I discussed with the patient that most people either abstain from alcohol  or drink within safe limits (<=14/week and <=4 drinks/occasion for males, <=7/weeks and <= 3 drinks/occasion for females) and that the risk for alcohol disorders and other health effects rises proportionally with the number of drinks per week and how often a drinker exceeds daily  limits.  Discussed cessation/primary prevention of drug use and availability of treatment for abuse.   Diet: Encouraged to adjust caloric intake to maintain  or achieve ideal body weight, to reduce intake of dietary saturated fat and total fat, to limit sodium intake by avoiding high sodium foods and not adding table salt, and to maintain adequate dietary potassium and calcium preferably from fresh fruits, vegetables, and low-fat dairy products.    Stressed the importance of regular exercise  Injury prevention: Discussed safety belts, safety helmets, smoke detector, smoking near bedding or upholstery.   Dental health: Discussed importance of regular tooth brushing, flossing, and dental visits.    NEXT PREVENTATIVE PHYSICAL DUE IN 1 YEAR. Return in about 6 months (around 04/26/2024) for HTN/HLD, CKD, MOOD.

## 2023-10-28 NOTE — Assessment & Plan Note (Signed)
Chronic, ongoing.  Continue daily iron supplement.  Recheck iron, B12, and CBC today.  Return to hematology as needed.

## 2023-10-28 NOTE — Assessment & Plan Note (Signed)
Chronic, stable.  No current supplement as levels have been stable. Check B12 level today and adjust regimen as needed. 

## 2023-10-28 NOTE — Assessment & Plan Note (Signed)
Chronic, stable at this time.  Continue to collaborate with neurology, is now off Lamictal per neurology.

## 2023-10-28 NOTE — Assessment & Plan Note (Signed)
History of reported by patient.  Check TSH, Free T4 today.

## 2023-10-28 NOTE — Assessment & Plan Note (Signed)
Chronic, stable.  Recommend she continue Melatonin every night and may take 10 MG nightly.

## 2023-10-28 NOTE — Assessment & Plan Note (Signed)
Chronic, stable.  BP at goal for age on recheck. Will continue Losartan 100 MG.  Recommend she monitor BP at home at least as few mornings a week and document, report to provider if consistent elevation >130/90.  DASH diet focus.  LABS: CBC, CMP, TSH.

## 2023-10-28 NOTE — Assessment & Plan Note (Signed)
Ongoing.  Noted on DEXA 01/01/22.  At this time continue Vitamin D supplement daily and adjust as needed.  Plan on repeat DEXA around 01/01/27.

## 2023-10-28 NOTE — Assessment & Plan Note (Addendum)
Mammogram up to date and remains reassuring.

## 2023-10-28 NOTE — Assessment & Plan Note (Signed)
Chronic, stable on Omeprazole.  Reports return of symptoms without medication, when has trialed GDR in past.  However, recommend she try to reduce to lowest amount needed dosing. Mag level annually.  Risks of PPI use were discussed with patient including bone loss, C. Diff diarrhea, pneumonia, infections, CKD, electrolyte abnormalities.  Verbalizes understanding and chooses to continue the medication.

## 2023-10-29 ENCOUNTER — Telehealth: Payer: Self-pay | Admitting: Nurse Practitioner

## 2023-10-29 LAB — VITAMIN D 25 HYDROXY (VIT D DEFICIENCY, FRACTURES): Vit D, 25-Hydroxy: 49.7 ng/mL (ref 30.0–100.0)

## 2023-10-29 LAB — MAGNESIUM: Magnesium: 1.9 mg/dL (ref 1.6–2.3)

## 2023-10-29 LAB — COMPREHENSIVE METABOLIC PANEL
ALT: 24 [IU]/L (ref 0–32)
AST: 26 [IU]/L (ref 0–40)
Albumin: 4.6 g/dL (ref 3.9–4.9)
Alkaline Phosphatase: 115 [IU]/L (ref 44–121)
BUN/Creatinine Ratio: 15 (ref 12–28)
BUN: 21 mg/dL (ref 8–27)
Bilirubin Total: 0.2 mg/dL (ref 0.0–1.2)
CO2: 20 mmol/L (ref 20–29)
Calcium: 9.8 mg/dL (ref 8.7–10.3)
Chloride: 104 mmol/L (ref 96–106)
Creatinine, Ser: 1.36 mg/dL — ABNORMAL HIGH (ref 0.57–1.00)
Globulin, Total: 2.7 g/dL (ref 1.5–4.5)
Glucose: 93 mg/dL (ref 70–99)
Potassium: 5 mmol/L (ref 3.5–5.2)
Sodium: 139 mmol/L (ref 134–144)
Total Protein: 7.3 g/dL (ref 6.0–8.5)
eGFR: 42 mL/min/{1.73_m2} — ABNORMAL LOW (ref >59)

## 2023-10-29 LAB — CBC WITH DIFFERENTIAL/PLATELET
Basophils Absolute: 0.1 10*3/uL (ref 0.0–0.2)
Basos: 1 %
EOS (ABSOLUTE): 0.3 10*3/uL (ref 0.0–0.4)
Eos: 4 %
Hematocrit: 34.5 % (ref 34.0–46.6)
Hemoglobin: 10.6 g/dL — ABNORMAL LOW (ref 11.1–15.9)
Immature Grans (Abs): 0 10*3/uL (ref 0.0–0.1)
Immature Granulocytes: 0 %
Lymphocytes Absolute: 2.3 10*3/uL (ref 0.7–3.1)
Lymphs: 40 %
MCH: 29.8 pg (ref 26.6–33.0)
MCHC: 30.7 g/dL — ABNORMAL LOW (ref 31.5–35.7)
MCV: 97 fL (ref 79–97)
Monocytes Absolute: 0.5 10*3/uL (ref 0.1–0.9)
Monocytes: 8 %
Neutrophils Absolute: 2.7 10*3/uL (ref 1.4–7.0)
Neutrophils: 47 %
Platelets: 246 10*3/uL (ref 150–450)
RBC: 3.56 x10E6/uL — ABNORMAL LOW (ref 3.77–5.28)
RDW: 12.7 % (ref 11.7–15.4)
WBC: 5.7 10*3/uL (ref 3.4–10.8)

## 2023-10-29 LAB — FERRITIN: Ferritin: 468 ng/mL — ABNORMAL HIGH (ref 15–150)

## 2023-10-29 LAB — LIPID PANEL W/O CHOL/HDL RATIO
Cholesterol, Total: 171 mg/dL (ref 100–199)
HDL: 58 mg/dL (ref >39)
LDL Chol Calc (NIH): 95 mg/dL (ref 0–99)
Triglycerides: 96 mg/dL (ref 0–149)
VLDL Cholesterol Cal: 18 mg/dL (ref 5–40)

## 2023-10-29 LAB — IRON: Iron: 70 ug/dL (ref 27–139)

## 2023-10-29 LAB — VITAMIN B12: Vitamin B-12: 923 pg/mL (ref 232–1245)

## 2023-10-29 LAB — T4, FREE: Free T4: 1.07 ng/dL (ref 0.82–1.77)

## 2023-10-29 LAB — TSH: TSH: 2.97 u[IU]/mL (ref 0.450–4.500)

## 2023-10-29 MED ORDER — EZETIMIBE 10 MG PO TABS: 10.0000 mg | ORAL_TABLET | Freq: Every day | ORAL | 3 refills | Status: DC

## 2023-10-29 NOTE — Telephone Encounter (Signed)
The patient called in stating she has been corresponding with her provider through my chart and that she would like to go ahead with the provider recommended prescription for Zetia. Can that please be called into  [facility] [address] [place]), [place] - [address] Phone: [phone]  Fax: [phone]     If her insurance will cover. Please assist patient further.

## 2023-10-29 NOTE — Telephone Encounter (Signed)
Called and LVM letting patient know that the medication was sent in for her.  

## 2023-10-29 NOTE — Progress Notes (Signed)
Contacted via MyChart   Good afternoon Kelly Poole, your labs have returned: - Ferritin level remains elevated, you saw hematology for this in past.  Hemoglobin is low, but hematocrit is normal.  We will monitor closely.  Ensure you eat lots of rich greens.  Iron and B12 levels are normal. Vitamin D is in normal range. - Thyroid level normal. - Lipid panel shows stable levels, although I would like to see LDL <70.  I do recommend starting Zetia along with your Rosuvastatin to see if we can get levels a bit lower. - Kidney function continues to show chronic kidney disease stage 3b, continue to visit kidney doctor.  Liver function, AST and ALT, is normal.  Any questions? Keep being amazing!!  Thank you for allowing me to participate in your care.  I appreciate you. Kindest regards, Raiden Haydu

## 2023-11-04 ENCOUNTER — Ambulatory Visit: Payer: Self-pay

## 2023-11-04 NOTE — Patient Outreach (Signed)
  Care Coordination   Initial Visit Note   11/04/2023 Name: JAMARIYAH HADDIX MRN: [id number] DOB: February 23, 1951  Nazareth ADVIKA FOXX is a 72 y.o. year old female who sees [place], Corrie Dandy T, NP for primary care. I spoke with  Phineas Inches by phone today.  What matters to the patients health and wellness today?  Patient is requesting information on resources for utilities.  SW does review patient budget.  Patient agrees she can manage on her own with her wages and SSA benefits.    Goals Addressed             This Visit's Progress    Care Coordination Activities       Interventions Today    Flowsheet Row Most Recent Value  Chronic Disease   Chronic disease during today's visit Hypertension (HTN), Chronic Kidney Disease/End Stage Renal Disease (ESRD)  General Interventions   General Interventions Discussed/Reviewed General Interventions Discussed, General Interventions Reviewed, [employer]  [Pt repaired her car and got behind on Utilities. Pt agreed to a payment plan and can manage going forward. Pt brings home almost $2800 monthly and can afford her bills. SW ref to DSS LIEAP for energy assistance.]              SDOH assessments and interventions completed:  Yes  SDOH Interventions Today    Flowsheet Row Most Recent Value  SDOH Interventions   Food Insecurity Interventions Intervention Not Indicated  Housing Interventions Intervention Not Indicated  Transportation Interventions Intervention Not Indicated, Other (Comment)  [Has a car]  Utilities Interventions Intervention Not Indicated  [Set up on payment plan]        Care Coordination Interventions:  Yes, provided   Follow up plan: No further intervention required.   Encounter Outcome:  Patient Visit Completed

## 2023-11-04 NOTE — Patient Instructions (Signed)
Visit Information  Thank you for taking time to visit with me today. Please don't hesitate to contact me if I can be of assistance to you.   Following are the goals we discussed today:  Patient will make regular payments for utilities and comply with payment plan. Patient will contact DSS to apply for the LIEAP program.    If you are experiencing a Mental Health or Behavioral Health Crisis or need someone to talk to, please call 911  Patient verbalizes understanding of instructions and care plan provided today and agrees to view in MyChart. Active MyChart status and patient understanding of how to access instructions and care plan via MyChart confirmed with patient.     No further follow up required: Patient does not request a follow up visit.  Lysle Morales, BSW Social Worker [phone]

## 2023-11-05 ENCOUNTER — Other Ambulatory Visit: Payer: Self-pay | Admitting: Nurse Practitioner

## 2023-11-05 NOTE — Telephone Encounter (Signed)
Requested Prescriptions  Pending Prescriptions Disp Refills   rosuvastatin (CRESTOR) 40 MG tablet [Pharmacy Med Name: Rosuvastatin Calcium Oral Tablet 40 MG] 90 tablet 1    Sig: TAKE 1 TABLET EVERY DAY     Cardiovascular:  Antilipid - Statins 2 Failed - 11/05/2023  3:14 AM      Failed - Cr in normal range and within 360 days    Creatinine, Ser  Date Value Ref Range Status  10/28/2023 1.36 (H) 0.57 - 1.00 mg/dL Final   Creatinine, Urine  Date Value Ref Range Status  12/23/2020 102 mg/dL Final    Comment:    Performed at [facility], [address] [place], [place] [address]         Failed - Lipid Panel in normal range within the last 12 months    Cholesterol, Total  Date Value Ref Range Status  10/28/2023 171 100 - 199 mg/dL Final   Cholesterol Piccolo, Waived  Date Value Ref Range Status  06/03/2015 178 <200 mg/dL Final    Comment:                            Desirable                <200                         Borderline High      200- 239                         High                     >239    LDL Chol Calc (NIH)  Date Value Ref Range Status  10/28/2023 95 0 - 99 mg/dL Final   HDL  Date Value Ref Range Status  10/28/2023 58 >39 mg/dL Final   Triglycerides  Date Value Ref Range Status  10/28/2023 96 0 - 149 mg/dL Final   Triglycerides Piccolo,Waived  Date Value Ref Range Status  06/03/2015 93 <150 mg/dL Final    Comment:                            Normal                   <150                         Borderline High     150 - 199                         High                200 - 499                         Very High                >499          Passed - Patient is not pregnant      Passed - Valid encounter within last 12 months    Recent Outpatient Visits           1 week ago Medicare annual wellness visit, subsequent   [place] [facility]  Aura Dials T, NP   6 months ago Stage 3b chronic kidney disease (HCC)   [facility] [place], Corrie Dandy T, NP   1 year ago Seizure [facility])   [place] [facility] [place], Corrie Dandy T, NP   1 year ago Allergic contact dermatitis due to plants, except food   [facility] Gabriel Cirri, NP   1 year ago Seizure [facility])   [place] [place], Corrie Dandy T, NP       Future Appointments             In 5 months Cannady, Carlton T, NP [place] [facility]             losartan (COZAAR) 100 MG tablet [Pharmacy Med Name: Losartan Potassium Oral Tablet 100 MG] 90 tablet 1    Sig: TAKE 1 TABLET EVERY DAY     Cardiovascular:  Angiotensin Receptor Blockers Failed - 11/05/2023  3:14 AM      Failed - Cr in normal range and within 180 days    Creatinine, Ser  Date Value Ref Range Status  10/28/2023 1.36 (H) 0.57 - 1.00 mg/dL Final   Creatinine, Urine  Date Value Ref Range Status  12/23/2020 102 mg/dL Final    Comment:    Performed at [facility], [address] [place], [place] [address]         Passed - K in normal range and within 180 days    Potassium  Date Value Ref Range Status  10/28/2023 5.0 3.5 - 5.2 mmol/L Final         Passed - Patient is not pregnant      Passed - Last BP in normal range    BP Readings from Last 1 Encounters:  10/28/23 124/78         Passed - Valid encounter within last 6 months    Recent Outpatient Visits           1 week ago Medicare annual wellness visit, subsequent   [place] [facility] [place], [place] T, NP   6 months ago Stage 3b chronic kidney disease (HCC)   [place] [facility] [place], Corrie Dandy T, NP   1 year ago Seizure [facility])   [place] [facility] [place], Corrie Dandy T, NP   1 year ago Allergic contact dermatitis due to plants, except food   [facility] Gabriel Cirri, NP   1 year ago Seizure [facility])   [place] [facility]  [place], Dorie Rank, NP       Future Appointments             In 5 months Cannady, Dorie Rank, NP [facility], [facility]

## 2024-02-24 ENCOUNTER — Ambulatory Visit

## 2024-03-08 ENCOUNTER — Other Ambulatory Visit: Payer: Self-pay | Admitting: Nurse Practitioner

## 2024-03-08 DIAGNOSIS — Z1231 Encounter for screening mammogram for malignant neoplasm of breast: Secondary | ICD-10-CM

## 2024-03-28 ENCOUNTER — Encounter

## 2024-03-28 ENCOUNTER — Telehealth: Payer: Self-pay

## 2024-03-28 NOTE — Telephone Encounter (Signed)
 Copied from CRM [id number]. Topic: Referral - Status >> Mar 28, 2024  2:34 PM Marland Kitchen D wrote: Need supporting notes within last months to a year that supports history of mastectomy Call back [phone] [facility]-kara

## 2024-03-30 ENCOUNTER — Other Ambulatory Visit: Payer: Self-pay | Admitting: Nurse Practitioner

## 2024-03-30 NOTE — Telephone Encounter (Signed)
 Requested Prescriptions  Pending Prescriptions Disp Refills   losartan (COZAAR) 100 MG tablet [Pharmacy Med Name: Losartan Potassium Oral Tablet 100 MG] 90 tablet 0    Sig: TAKE 1 TABLET EVERY DAY     Cardiovascular:  Angiotensin Receptor Blockers Failed - 03/30/2024  2:56 PM      Failed - Cr in normal range and within 180 days    Creatinine, Ser  Date Value Ref Range Status  10/28/2023 1.36 (H) 0.57 - 1.00 mg/dL Final   Creatinine, Urine  Date Value Ref Range Status  12/23/2020 102 mg/dL Final    Comment:    Performed at [facility], [address], [place], [place] [address]         Failed - Valid encounter within last 6 months    Recent Outpatient Visits   None     Future Appointments             In 3 weeks Marjie Skiff, NP [place] [facility], [facility]            Passed - K in normal range and within 180 days    Potassium  Date Value Ref Range Status  10/28/2023 5.0 3.5 - 5.2 mmol/L Final         Passed - Patient is not pregnant      Passed - Last BP in normal range    BP Readings from Last 1 Encounters:  10/28/23 124/78          rosuvastatin (CRESTOR) 40 MG tablet [Pharmacy Med Name: Rosuvastatin Calcium Oral Tablet 40 MG] 90 tablet 0    Sig: TAKE 1 TABLET EVERY DAY     Cardiovascular:  Antilipid - Statins 2 Failed - 03/30/2024  2:56 PM      Failed - Cr in normal range and within 360 days    Creatinine, Ser  Date Value Ref Range Status  10/28/2023 1.36 (H) 0.57 - 1.00 mg/dL Final   Creatinine, Urine  Date Value Ref Range Status  12/23/2020 102 mg/dL Final    Comment:    Performed at [facility], [address] [place], [place] [address]         Failed - Valid encounter within last 12 months    Recent Outpatient Visits   None     Future Appointments             In 3 weeks Marjie Skiff, NP [place] [facility]            Failed - Lipid Panel in normal range within the last  12 months    Cholesterol, Total  Date Value Ref Range Status  10/28/2023 171 100 - 199 mg/dL Final   Cholesterol Piccolo, Waived  Date Value Ref Range Status  06/03/2015 178 <200 mg/dL Final    Comment:                            Desirable                <200                         Borderline High      200- 239                         High                     >  239    LDL Chol Calc (NIH)  Date Value Ref Range Status  10/28/2023 95 0 - 99 mg/dL Final   HDL  Date Value Ref Range Status  10/28/2023 58 >39 mg/dL Final   Triglycerides  Date Value Ref Range Status  10/28/2023 96 0 - 149 mg/dL Final   Triglycerides Piccolo,Waived  Date Value Ref Range Status  06/03/2015 93 <150 mg/dL Final    Comment:                            Normal                   <150                         Borderline High     150 - 199                         High                200 - 499                         Very High                >499          Passed - Patient is not pregnant

## 2024-04-03 ENCOUNTER — Telehealth: Payer: Self-pay | Admitting: Nurse Practitioner

## 2024-04-03 NOTE — Telephone Encounter (Signed)
 Copied from CRM [id number]. Topic: General - Other >> Apr 03, 2024 11:31 AM Elle L wrote: Reason for CRM: Cara with [employer], [phone], was following up on their request of the patient's last appointment notes that show her history of having a mastectomy. Their fax number is [phone].

## 2024-04-03 NOTE — Telephone Encounter (Signed)
 As requested note has been faxed.

## 2024-04-14 ENCOUNTER — Other Ambulatory Visit: Payer: Self-pay | Admitting: Nurse Practitioner

## 2024-04-14 ENCOUNTER — Ambulatory Visit
Admission: RE | Admit: 2024-04-14 | Discharge: 2024-04-14 | Disposition: A | Discharge status: Home / Self Care | Source: Ambulatory Visit | Attending: Nurse Practitioner | Admitting: Nurse Practitioner

## 2024-04-14 DIAGNOSIS — Z1231 Encounter for screening mammogram for malignant neoplasm of breast: Secondary | ICD-10-CM | POA: Diagnosis present

## 2024-04-17 ENCOUNTER — Encounter: Payer: Self-pay | Admitting: Nurse Practitioner

## 2024-04-17 NOTE — Progress Notes (Signed)
 Contacted via MyChart   Normal mammogram, may repeat in one year:)

## 2024-04-22 NOTE — Patient Instructions (Incomplete)
 Start out wit Metamucil gummies and then if need a little extra help take Senna-S. Can also drink prune juice daily.  Be Involved in Caring For Your Health:  Taking Medications When medications are taken as directed, they can greatly improve your health. But if they are not taken as prescribed, they may not work. In some cases, not taking them correctly can be harmful. To help ensure your treatment remains effective and safe, understand your medications and how to take them. Bring your medications to each visit for review by your provider.  Your lab results, notes, and after visit summary will be available on My Chart. We strongly encourage you to use this feature. If lab results are abnormal the clinic will contact you with the appropriate steps. If the clinic does not contact you assume the results are satisfactory. You can always view your results on My Chart. If you have questions regarding your health or results, please contact the clinic during office hours. You can also ask questions on My Chart.  We at [facility] are grateful that you chose us  to provide your care. We strive to provide evidence-based and compassionate care and are always looking for feedback. If you get a survey from the clinic please complete this so we can hear your opinions.  DASH Eating Plan DASH stands for Dietary Approaches to Stop Hypertension. The DASH eating plan is a healthy eating plan that has been shown to: Lower high blood pressure (hypertension). Reduce your risk for type 2 diabetes, heart disease, and stroke. Help with weight loss. What are tips for following this plan? Reading food labels Check food labels for the amount of salt (sodium) per serving. Choose foods with less than 5 percent of the Daily Value (DV) of sodium. In general, foods with less than 300 milligrams (mg) of sodium per serving fit into this eating plan. To find whole grains, look for the word "whole" as the first word in  the ingredient list. Shopping Buy products labeled as "low-sodium" or "no salt added." Buy fresh foods. Avoid canned foods and pre-made or frozen meals. Cooking Try not to add salt when you cook. Use salt-free seasonings or herbs instead of table salt or sea salt. Check with your health care provider or pharmacist before using salt substitutes. Do not fry foods. Cook foods in healthy ways, such as baking, boiling, grilling, roasting, or broiling. Cook using oils that are good for your heart. These include olive, canola, avocado, soybean, and sunflower oil. Meal planning  Eat a balanced diet. This should include: 4 or more servings of fruits and 4 or more servings of vegetables each day. Try to fill half of your plate with fruits and vegetables. 6-8 servings of whole grains each day. 6 or less servings of lean meat, poultry, or fish each day. 1 oz is 1 serving. A 3 oz (85 g) serving of meat is about the same size as the palm of your hand. One egg is 1 oz (28 g). 2-3 servings of low-fat dairy each day. One serving is 1 cup (237 mL). 1 serving of nuts, seeds, or beans 5 times each week. 2-3 servings of heart-healthy fats. Healthy fats called omega-3 fatty acids are found in foods such as walnuts, flaxseeds, fortified milks, and eggs. These fats are also found in cold-water fish, such as sardines, salmon, and mackerel. Limit how much you eat of: Canned or prepackaged foods. Food that is high in trans fat, such as fried foods. Food that  is high in saturated fat, such as fatty meat. Desserts and other sweets, sugary drinks, and other foods with added sugar. Full-fat dairy products. Do not salt foods before eating. Do not eat more than 4 egg yolks a week. Try to eat at least 2 vegetarian meals a week. Eat more home-cooked food and less restaurant, buffet, and fast food. Lifestyle When eating at a restaurant, ask if your food can be made with less salt or no salt. If you drink alcohol: Limit  how much you have to: 0-1 drink a day if you are female. 0-2 drinks a day if you are female. Know how much alcohol is in your drink. In the [place] one drink is one 12 oz bottle of beer (355 mL), one 5 oz glass of wine (148 mL), or one 1 oz glass of hard liquor (44 mL). General information Avoid eating more than 2,300 mg of salt a day. If you have hypertension, you may need to reduce your sodium intake to 1,500 mg a day. Work with your provider to stay at a healthy body weight or lose weight. Ask what the best weight range is for you. On most days of the week, get at least 30 minutes of exercise that causes your heart to beat faster. This may include walking, swimming, or biking. Work with your provider or dietitian to adjust your eating plan to meet your specific calorie needs. What foods should I eat? Fruits All fresh, dried, or frozen fruit. Canned fruits that are in their natural juice and do not have sugar added to them. Vegetables Fresh or frozen vegetables that are raw, steamed, roasted, or grilled. Low-sodium or reduced-sodium tomato and vegetable juice. Low-sodium or reduced-sodium tomato sauce and tomato paste. Low-sodium or reduced-sodium canned vegetables. Grains Whole-grain or whole-wheat bread. Whole-grain or whole-wheat pasta. Brown rice. Dwyane Glad. Bulgur. Whole-grain and low-sodium cereals. Pita bread. Low-fat, low-sodium crackers. Whole-wheat flour tortillas. Meats and other proteins Skinless chicken or [place]. Ground chicken or [place]. Pork with fat trimmed off. Fish and seafood. Egg whites. Dried beans, peas, or lentils. Unsalted nuts, nut butters, and seeds. Unsalted canned beans. Lean cuts of beef with fat trimmed off. Low-sodium, lean precooked or cured meat, such as sausages or meat loaves. Dairy Low-fat (1%) or fat-free (skim) milk. Reduced-fat, low-fat, or fat-free cheeses. Nonfat, low-sodium ricotta or cottage cheese. Low-fat or nonfat yogurt. Low-fat, low-sodium  cheese. Fats and oils Soft margarine without trans fats. Vegetable oil. Reduced-fat, low-fat, or light mayonnaise and salad dressings (reduced-sodium). Canola, safflower, olive, avocado, soybean, and sunflower oils. Avocado. Seasonings and condiments Herbs. Spices. Seasoning mixes without salt. Other foods Unsalted popcorn and pretzels. Fat-free sweets. The items listed above may not be all the foods and drinks you can have. Talk to a dietitian to learn more. What foods should I avoid? Fruits Canned fruit in a light or heavy syrup. Fried fruit. Fruit in cream or butter sauce. Vegetables Creamed or fried vegetables. Vegetables in a cheese sauce. Regular canned vegetables that are not marked as low-sodium or reduced-sodium. Regular canned tomato sauce and paste that are not marked as low-sodium or reduced-sodium. Regular tomato and vegetable juices that are not marked as low-sodium or reduced-sodium. Vanessa General. Olives. Grains Baked goods made with fat, such as croissants, muffins, or some breads. Dry pasta or rice meal packs. Meats and other proteins Fatty cuts of meat. Ribs. Fried meat. Helene Loader. Bologna, salami, and other precooked or cured meats, such as sausages or meat loaves, that are not lean and  low in sodium. Fat from the back of a pig (fatback). Bratwurst. Salted nuts and seeds. Canned beans with added salt. Canned or smoked fish. Whole eggs or egg yolks. Chicken or [place] with skin. Dairy Whole or 2% milk, cream, and half-and-half. Whole or full-fat cream cheese. Whole-fat or sweetened yogurt. Full-fat cheese. Nondairy creamers. Whipped toppings. Processed cheese and cheese spreads. Fats and oils Butter. Stick margarine. Lard. Shortening. Ghee. Bacon fat. Tropical oils, such as coconut, palm kernel, or palm oil. Seasonings and condiments Onion salt, garlic salt, seasoned salt, table salt, and sea salt. Worcestershire sauce. Tartar sauce. Barbecue sauce. Teriyaki sauce. Soy sauce, including  reduced-sodium soy sauce. Steak sauce. Canned and packaged gravies. Fish sauce. Oyster sauce. Cocktail sauce. Store-bought horseradish. Ketchup. Mustard. Meat flavorings and tenderizers. Bouillon cubes. Hot sauces. Pre-made or packaged marinades. Pre-made or packaged taco seasonings. Relishes. Regular salad dressings. Other foods Salted popcorn and pretzels. The items listed above may not be all the foods and drinks you should avoid. Talk to a dietitian to learn more. Where to find more information National Heart, Lung, and Blood Institute (NHLBI): BuffaloDryCleaner.gl American Heart Association (AHA): heart.org Academy of Nutrition and Dietetics: eatright.org National Kidney Foundation (NKF): kidney.org This information is not intended to replace advice given to you by your health care provider. Make sure you discuss any questions you have with your health care provider. Document Revised: 12/24/2022 Document Reviewed: 12/24/2022 Elsevier Patient Education  2024 [employer].

## 2024-04-26 ENCOUNTER — Encounter: Payer: Self-pay | Admitting: Nurse Practitioner

## 2024-04-26 ENCOUNTER — Ambulatory Visit (INDEPENDENT_AMBULATORY_CARE_PROVIDER_SITE_OTHER): Payer: Self-pay | Admitting: Nurse Practitioner

## 2024-04-26 VITALS — BP 118/74 | HR 68 | Temp 97.6°F | Ht <= 58 in | Wt 148.6 lb

## 2024-04-26 DIAGNOSIS — E7800 Pure hypercholesterolemia, unspecified: Secondary | ICD-10-CM | POA: Diagnosis not present

## 2024-04-26 DIAGNOSIS — I10 Essential (primary) hypertension: Secondary | ICD-10-CM

## 2024-04-26 DIAGNOSIS — N1832 Chronic kidney disease, stage 3b: Secondary | ICD-10-CM | POA: Diagnosis not present

## 2024-04-26 DIAGNOSIS — D631 Anemia in chronic kidney disease: Secondary | ICD-10-CM

## 2024-04-26 DIAGNOSIS — R801 Persistent proteinuria, unspecified: Secondary | ICD-10-CM

## 2024-04-26 DIAGNOSIS — R7989 Other specified abnormal findings of blood chemistry: Secondary | ICD-10-CM

## 2024-04-26 DIAGNOSIS — N2581 Secondary hyperparathyroidism of renal origin: Secondary | ICD-10-CM | POA: Diagnosis not present

## 2024-04-26 DIAGNOSIS — R198 Other specified symptoms and signs involving the digestive system and abdomen: Secondary | ICD-10-CM | POA: Insufficient documentation

## 2024-04-26 DIAGNOSIS — Z853 Personal history of malignant neoplasm of breast: Secondary | ICD-10-CM

## 2024-04-26 LAB — MICROALBUMIN, URINE WAIVED
Creatinine, Urine Waived: 200 mg/dL (ref 10–300)
Microalb, Ur Waived: 150 mg/L — ABNORMAL HIGH (ref 0–19)

## 2024-04-26 NOTE — Assessment & Plan Note (Signed)
 Chronic, ongoing.  Continue current medication regimen and adjust as needed. Lipid panel today.

## 2024-04-26 NOTE — Assessment & Plan Note (Signed)
Ongoing on labs with underlying CKD.  Continue Losartan for kidney protection.  Urine ALB in office today.

## 2024-04-26 NOTE — Assessment & Plan Note (Signed)
 Ongoing, saw hematology for this in past and is to return as needed.  Check level today.

## 2024-04-26 NOTE — Assessment & Plan Note (Signed)
 Chronic, stable.  Continue to collaborate with nephrology. She is scheduled upcoming for follow-up.  Check labs today.

## 2024-04-26 NOTE — Assessment & Plan Note (Signed)
 Chronic, ongoing.  Continue daily iron supplement.  Recheck iron, B12, ferritin today.  Return to hematology as needed.

## 2024-04-26 NOTE — Assessment & Plan Note (Signed)
 Mammogram up to date and remains reassuring.

## 2024-04-26 NOTE — Assessment & Plan Note (Signed)
 Over past 2 days, at baseline has no issues.  Recommend she start drinking prune juice daily, which she reports does help, or taking Metamucil gummies daily for next 7 days.  Ensure plenty of water and fiber intake.  Goal is to have no straining with her daily BM.  May try Senna-S if ongoing.

## 2024-04-26 NOTE — Progress Notes (Signed)
 BP 118/74 (BP Location: Left Arm, Patient Position: Sitting, Cuff Size: Normal)   Pulse 68   Temp 97.6 F (36.4 C) (Oral)   Ht 4\' 9"  (1.448 m)   Wt 148 lb 9.6 oz (67.4 kg)   SpO2 99%   BMI 32.16 kg/m    Subjective:    Patient ID: Kelly Poole, female    DOB: 01/17/1951, 73 y.o.   MRN: [id number]  HPI: Kelly Poole is a 73 y.o. female  Chief Complaint  Patient presents with   Chronic Kidney Disease   Depression   Hyperlipidemia   Hypertension   HYPERTENSION / HYPERLIPIDEMIA Continues to take Losartan  100 MG daily & Rosuvastatin  + Zetia  daily. Has been exercising and working on diet, has lost 3 lbs.  Has history of breast cancer with mammogram currently up to date. Satisfied with current treatment? yes Duration of hypertension: chronic BP monitoring frequency: occasional BP range: 110-120/80 BP medication side effects: no Duration of hyperlipidemia: chronic Cholesterol medication side effects: no Cholesterol supplements: none Medication compliance: good compliance Aspirin : yes Recent stressors: no Recurrent headaches: no Visual changes: no Palpitations: no Dyspnea: no Chest pain: no Lower extremity edema: no Dizzy/lightheaded: no  The 10-year ASCVD risk score (Arnett DK, et al., 2019) is: 9%   Values used to calculate the score:     Age: 62 years     Sex: Female     Is Non-Hispanic African American: Yes     Diabetic: No     Tobacco smoker: No     Systolic Blood Pressure: 118 mmHg     Is BP treated: Yes     HDL Cholesterol: 58 mg/dL     Total Cholesterol: 171 mg/dL  CHRONIC KIDNEY DISEASE Saw nephrology last on 05/20/22 and returns to see them next month she reports. Takes iron on occasion & B12 daily for past anemia in CKD + elevation in ferritin, was followed by hematology for this.  Last visit was 06/02/22 she is to return PRN. CKD status: stable Medications renally dose: no Previous renal evaluation: no Pneumovax:  refused Influenza Vaccine:   refused    Latest Ref Rng & Units 10/28/2023    9:27 AM 04/28/2023   10:24 AM 09/09/2022    8:33 AM  BMP  Glucose 70 - 99 mg/dL 93  478  295   BUN 8 - 27 mg/dL 21  26  23    Creatinine 0.57 - 1.00 mg/dL 6.21  3.08  6.57   BUN/Creat Ratio 12 - 28 15  18  17    Sodium 134 - 144 mmol/L 139  141  138   Potassium 3.5 - 5.2 mmol/L 5.0  5.0  4.6   Chloride 96 - 106 mmol/L 104  104  103   CO2 20 - 29 mmol/L 20  20  21    Calcium  8.7 - 10.3 mg/dL 9.8  9.8  84.6     CONSTIPATION Reports for the past two days she has been struggling with constipation.  Has BM every morning at baseline.  The past two days has been straining to have BM. Has been eating lots of fruit and drinking lots of water. Alleviating factors: nothing Aggravating factors: unknown Status: stable Treatments attempted: none Fever: no Nausea: no Vomiting: no Weight loss: no Decreased appetite: no Diarrhea: no Constipation: yes Blood in stool: no Heartburn: no Jaundice: no  Relevant past medical, surgical, family and social history reviewed and updated as indicated. Interim medical history since our last visit  reviewed. Allergies and medications reviewed and updated.  Review of Systems  Constitutional:  Negative for activity change, appetite change, diaphoresis, fatigue and fever.  Respiratory:  Negative for cough, chest tightness and shortness of breath.   Cardiovascular:  Negative for chest pain, palpitations and leg swelling.  Gastrointestinal:  Positive for constipation. Negative for abdominal distention, abdominal pain, blood in stool, diarrhea, nausea and vomiting.  Neurological: Negative.   Psychiatric/Behavioral: Negative.     Per HPI unless specifically indicated above     Objective:    BP 118/74 (BP Location: Left Arm, Patient Position: Sitting, Cuff Size: Normal)   Pulse 68   Temp 97.6 F (36.4 C) (Oral)   Ht 4\' 9"  (1.448 m)   Wt 148 lb 9.6 oz (67.4 kg)   SpO2 99%   BMI 32.16 kg/m   Wt Readings from  Last 3 Encounters:  04/26/24 148 lb 9.6 oz (67.4 kg)  10/28/23 151 lb 3.2 oz (68.6 kg)  04/28/23 151 lb 1.6 oz (68.5 kg)    Physical Exam Vitals and nursing note reviewed.  Constitutional:      General: She is awake. She is not in acute distress.    Appearance: She is well-developed and well-groomed. She is obese. She is not ill-appearing or toxic-appearing.  HENT:     Head: Normocephalic.     Right Ear: Hearing normal.     Left Ear: Hearing normal.  Eyes:     General: Lids are normal.        Right eye: No discharge.        Left eye: No discharge.     Conjunctiva/sclera: Conjunctivae normal.     Pupils: Pupils are equal, round, and reactive to light.  Neck:     Thyroid : No thyromegaly.     Vascular: No carotid bruit.  Cardiovascular:     Rate and Rhythm: Normal rate and regular rhythm.     Heart sounds: Normal heart sounds. No murmur heard.    No gallop.  Pulmonary:     Effort: Pulmonary effort is normal. No accessory muscle usage or respiratory distress.     Breath sounds: Normal breath sounds.  Abdominal:     General: Bowel sounds are normal. There is no distension.     Palpations: Abdomen is soft. There is no mass.     Tenderness: There is no abdominal tenderness. There is no right CVA tenderness or left CVA tenderness.  Musculoskeletal:     Cervical back: Normal range of motion and neck supple.     Right lower leg: No edema.     Left lower leg: No edema.  Lymphadenopathy:     Cervical: No cervical adenopathy.  Skin:    General: Skin is warm and dry.  Neurological:     Mental Status: She is alert and oriented to person, place, and time.  Psychiatric:        Attention and Perception: Attention normal.        Mood and Affect: Mood normal.        Speech: Speech normal.        Behavior: Behavior normal. Behavior is cooperative.        Thought Content: Thought content normal.    Results for orders placed or performed in visit on 10/28/23  Iron   Collection Time:  10/28/23  9:27 AM  Result Value Ref Range   Iron 70 27 - 139 ug/dL  Ferritin   Collection Time: 10/28/23  9:27 AM  Result Value  Ref Range   Ferritin 468 (H) 15 - 150 ng/mL  T4, free   Collection Time: 10/28/23  9:27 AM  Result Value Ref Range   Free T4 1.07 0.82 - 1.77 ng/dL  CBC with Differential/Platelet   Collection Time: 10/28/23  9:27 AM  Result Value Ref Range   WBC 5.7 3.4 - 10.8 x10E3/uL   RBC 3.56 (L) 3.77 - 5.28 x10E6/uL   Hemoglobin 10.6 (L) 11.1 - 15.9 g/dL   Hematocrit 86.5 78.4 - 46.6 %   MCV 97 79 - 97 fL   MCH 29.8 26.6 - 33.0 pg   MCHC 30.7 (L) 31.5 - 35.7 g/dL   RDW 69.6 29.5 - 28.4 %   Platelets 246 150 - 450 x10E3/uL   Neutrophils 47 Not Estab. %   Lymphs 40 Not Estab. %   Monocytes 8 Not Estab. %   Eos 4 Not Estab. %   Basos 1 Not Estab. %   Neutrophils Absolute 2.7 1.4 - 7.0 x10E3/uL   Lymphocytes Absolute 2.3 0.7 - 3.1 x10E3/uL   Monocytes Absolute 0.5 0.1 - 0.9 x10E3/uL   EOS (ABSOLUTE) 0.3 0.0 - 0.4 x10E3/uL   Basophils Absolute 0.1 0.0 - 0.2 x10E3/uL   Immature Granulocytes 0 Not Estab. %   Immature Grans (Abs) 0.0 0.0 - 0.1 x10E3/uL  Comprehensive metabolic panel   Collection Time: 10/28/23  9:27 AM  Result Value Ref Range   Glucose 93 70 - 99 mg/dL   BUN 21 8 - 27 mg/dL   Creatinine, Ser 1.32 (H) 0.57 - 1.00 mg/dL   eGFR 42 (L) >44 WN/UUV/2.53   BUN/Creatinine Ratio 15 12 - 28   Sodium 139 134 - 144 mmol/L   Potassium 5.0 3.5 - 5.2 mmol/L   Chloride 104 96 - 106 mmol/L   CO2 20 20 - 29 mmol/L   Calcium  9.8 8.7 - 10.3 mg/dL   Total Protein 7.3 6.0 - 8.5 g/dL   Albumin 4.6 3.9 - 4.9 g/dL   Globulin, Total 2.7 1.5 - 4.5 g/dL   Bilirubin Total 0.2 0.0 - 1.2 mg/dL   Alkaline Phosphatase 115 44 - 121 IU/L   AST 26 0 - 40 IU/L   ALT 24 0 - 32 IU/L  Lipid Panel w/o Chol/HDL Ratio   Collection Time: 10/28/23  9:27 AM  Result Value Ref Range   Cholesterol, Total 171 100 - 199 mg/dL   Triglycerides 96 0 - 149 mg/dL   HDL 58 >66 mg/dL    VLDL Cholesterol Cal 18 5 - 40 mg/dL   LDL Chol Calc (NIH) 95 0 - 99 mg/dL  Magnesium    Collection Time: 10/28/23  9:27 AM  Result Value Ref Range   Magnesium  1.9 1.6 - 2.3 mg/dL  TSH   Collection Time: 10/28/23  9:27 AM  Result Value Ref Range   TSH 2.970 0.450 - 4.500 uIU/mL  VITAMIN D  25 Hydroxy (Vit-D Deficiency, Fractures)   Collection Time: 10/28/23  9:27 AM  Result Value Ref Range   Vit D, 25-Hydroxy 49.7 30.0 - 100.0 ng/mL  Vitamin B12   Collection Time: 10/28/23  9:27 AM  Result Value Ref Range   Vitamin B-12 923 232 - 1,245 pg/mL      Assessment & Plan:   Problem List Items Addressed This Visit       Cardiovascular and Mediastinum   Hypertension   Chronic, stable.  BP at goal for age on recheck and on her home checks. Will continue Losartan  100 MG.  Recommend she monitor BP at home at least as few mornings a week and document, report to provider if consistent elevation >130/80.  DASH diet focus.  LABS: CMP and TSH.      Relevant Orders   Comprehensive metabolic panel with GFR   TSH     Endocrine   Secondary hyperparathyroidism (HCC)   Chronic, stable.  Continue to collaborate with nephrology. She is scheduled upcoming for follow-up.  Check labs today.        Genitourinary   CKD (chronic kidney disease) stage 3, GFR 30-59 ml/min (HCC) - Primary   Ongoing, CKD 3b.  Continue Losartan  for kidney protection.  CMP today.  Continue to collaborate with nephrology, appreciate their input. Recommend she increase her water intake daily.  Avoid Ibuprofen.      Relevant Orders   Comprehensive metabolic panel with GFR   Microalbumin, Urine Waived     Other   Straining during bowel movements   Over past 2 days, at baseline has no issues.  Recommend she start drinking prune juice daily, which she reports does help, or taking Metamucil gummies daily for next 7 days.  Ensure plenty of water and fiber intake.  Goal is to have no straining with her daily BM.  May try Senna-S  if ongoing.      Persistent proteinuria   Ongoing on labs with underlying CKD.  Continue Losartan  for kidney protection.  Urine ALB in office today.      Relevant Orders   Microalbumin, Urine Waived   Hyperlipidemia   Chronic, ongoing.  Continue current medication regimen and adjust as needed.  Lipid panel today.      Relevant Orders   Comprehensive metabolic panel with GFR   Lipid Panel w/o Chol/HDL Ratio   HX: breast cancer   Mammogram up to date and remains reassuring.      Elevated ferritin level   Ongoing, saw hematology for this in past and is to return as needed.  Check level today.      Relevant Orders   Ferritin   Iron   Vitamin B12   Anemia   Chronic, ongoing.  Continue daily iron supplement.  Recheck iron, B12, ferritin today.  Return to hematology as needed.      Relevant Orders   Iron   Vitamin B12     Follow up plan: Return in about 6 months (around 10/27/2024) for Annual Physical after 10/27/2024.

## 2024-04-26 NOTE — Assessment & Plan Note (Signed)
 Ongoing, CKD 3b.  Continue Losartan for kidney protection.  CMP today.  Continue to collaborate with nephrology, appreciate their input. Recommend she increase her water intake daily.  Avoid Ibuprofen.

## 2024-04-26 NOTE — Assessment & Plan Note (Signed)
 Chronic, stable.  BP at goal for age on recheck and on her home checks. Will continue Losartan  100 MG.  Recommend she monitor BP at home at least as few mornings a week and document, report to provider if consistent elevation >130/80.  DASH diet focus.  LABS: CMP and TSH.

## 2024-04-27 ENCOUNTER — Encounter: Payer: Self-pay | Admitting: Nurse Practitioner

## 2024-04-27 NOTE — Progress Notes (Signed)
 Contacted via MyChart   Good evening Delbra, your labs have returned: - Kidney function continues to show Stage 3b kidney disease which we will continue to monitor closely.  Liver function, AST and ALT, this check is showing slight trend up.  Any increase in Tylenol  use or any alcohol use?  Let me know.:) - Ferritin remains a little elevated, will continue to monitor. - Remainder of labs stable, just waiting on B12.  Any questions? Keep being amazing!!  Thank you for allowing me to participate in your care.  I appreciate you. Kindest regards, Thanvi Blincoe

## 2024-04-28 LAB — COMPREHENSIVE METABOLIC PANEL WITH GFR
ALT: 52 IU/L — ABNORMAL HIGH (ref 0–32)
AST: 41 IU/L — ABNORMAL HIGH (ref 0–40)
Albumin: 4.7 g/dL (ref 3.9–4.9)
Alkaline Phosphatase: 113 IU/L (ref 44–121)
BUN/Creatinine Ratio: 15 (ref 12–28)
BUN: 22 mg/dL (ref 8–27)
Bilirubin Total: 0.2 mg/dL (ref 0.0–1.2)
CO2: 23 mmol/L (ref 20–29)
Calcium: 9.8 mg/dL (ref 8.7–10.3)
Chloride: 105 mmol/L (ref 96–106)
Creatinine, Ser: 1.47 mg/dL — ABNORMAL HIGH (ref 0.57–1.00)
Globulin, Total: 2.6 g/dL (ref 1.5–4.5)
Glucose: 97 mg/dL (ref 70–99)
Potassium: 5 mmol/L (ref 3.5–5.2)
Sodium: 140 mmol/L (ref 134–144)
Total Protein: 7.3 g/dL (ref 6.0–8.5)
eGFR: 38 mL/min/{1.73_m2} — ABNORMAL LOW (ref >59)

## 2024-04-28 LAB — LIPID PANEL W/O CHOL/HDL RATIO
Cholesterol, Total: 142 mg/dL (ref 100–199)
HDL: 51 mg/dL (ref >39)
LDL Chol Calc (NIH): 75 mg/dL (ref 0–99)
Triglycerides: 80 mg/dL (ref 0–149)
VLDL Cholesterol Cal: 16 mg/dL (ref 5–40)

## 2024-04-28 LAB — FERRITIN: Ferritin: 550 ng/mL — ABNORMAL HIGH (ref 15–150)

## 2024-04-28 LAB — VITAMIN B12: Vitamin B-12: 898 pg/mL (ref 232–1245)

## 2024-04-28 LAB — TSH: TSH: 3.39 u[IU]/mL (ref 0.450–4.500)

## 2024-04-28 LAB — IRON: Iron: 65 ug/dL (ref 27–139)

## 2024-06-08 ENCOUNTER — Encounter: Payer: Self-pay | Admitting: Nurse Practitioner

## 2024-06-08 ENCOUNTER — Ambulatory Visit (INDEPENDENT_AMBULATORY_CARE_PROVIDER_SITE_OTHER): Admitting: Nurse Practitioner

## 2024-06-08 VITALS — BP 140/82 | HR 65 | Temp 98.2°F | Ht 59.0 in | Wt 149.0 lb

## 2024-06-08 DIAGNOSIS — J019 Acute sinusitis, unspecified: Secondary | ICD-10-CM | POA: Insufficient documentation

## 2024-06-08 DIAGNOSIS — J0110 Acute frontal sinusitis, unspecified: Secondary | ICD-10-CM | POA: Diagnosis not present

## 2024-06-08 MED ORDER — AZITHROMYCIN 250 MG PO TABS: ORAL_TABLET | ORAL | 0 refills | Status: AC

## 2024-06-08 NOTE — Progress Notes (Signed)
 BP (!) 140/82 (BP Location: Left Arm, Patient Position: Sitting)   Pulse 65   Temp 98.2 F (36.8 C) (Oral)   Ht 4' 11 (1.499 m)   Wt 149 lb (67.6 kg)   SpO2 97%   BMI 30.09 kg/m    Subjective:    Patient ID: Kelly Poole, female    DOB: January 15, 1951, 73 y.o.   MRN: [id number]  HPI: Kelly Poole is a 73 y.o. female  Chief Complaint  Patient presents with   Cough    Patient states she has been having a dry cough for the last week. States she is not coughing up much phlegm. States she has been taking dayquil, nyquil, and coricidin HBP. No fever per patient.    UPPER RESPIRATORY TRACT INFECTION Present for about 7-8 days.  Her sister has this too, but hers is worse.  Dry cough. Fever: chills initially Cough: yes Shortness of breath: no Wheezing: no Chest pain: no Chest tightness: no Chest congestion: no Nasal congestion: yes Runny nose: yes Post nasal drip: yes Sneezing: no Sore throat: no Swollen glands: no Sinus pressure: no Headache: no Face pain: no Toothache: no Ear pain: none Ear pressure: none Eyes red/itching:no Eye drainage/crusting: no  Vomiting: no Rash: no Fatigue: yes Sick contacts: yes Strep contacts: no  Context: fluctuating Recurrent sinusitis: no Relief with OTC cold/cough medications: helps her cough up something  Treatments attempted: Nyquil/Dayquil for HBP and Coricidin    Relevant past medical, surgical, family and social history reviewed and updated as indicated. Interim medical history since our last visit reviewed. Allergies and medications reviewed and updated.  Review of Systems  Constitutional:  Positive for chills and fatigue. Negative for activity change, appetite change and fever.  HENT:  Positive for congestion, postnasal drip and rhinorrhea. Negative for ear discharge, ear pain, facial swelling, sinus pressure, sinus pain, sneezing, sore throat and voice change.   Respiratory:  Positive for cough. Negative for chest  tightness, shortness of breath and wheezing.   Cardiovascular:  Negative for chest pain, palpitations and leg swelling.  Gastrointestinal: Negative.   Psychiatric/Behavioral: Negative.      Per HPI unless specifically indicated above     Objective:    BP (!) 140/82 (BP Location: Left Arm, Patient Position: Sitting)   Pulse 65   Temp 98.2 F (36.8 C) (Oral)   Ht 4' 11 (1.499 m)   Wt 149 lb (67.6 kg)   SpO2 97%   BMI 30.09 kg/m   Wt Readings from Last 3 Encounters:  06/08/24 149 lb (67.6 kg)  04/26/24 148 lb 9.6 oz (67.4 kg)  10/28/23 151 lb 3.2 oz (68.6 kg)    Physical Exam Vitals and nursing note reviewed.  Constitutional:      General: She is awake. She is not in acute distress.    Appearance: She is well-developed and well-groomed. She is obese. She is not ill-appearing or toxic-appearing.  HENT:     Head: Normocephalic.     Right Ear: Hearing, ear canal and external ear normal. A middle ear effusion is present. Tympanic membrane is not injected or perforated.     Left Ear: Hearing, ear canal and external ear normal. A middle ear effusion is present. Tympanic membrane is not injected or perforated.     Nose: Rhinorrhea present. Rhinorrhea is clear.     Right Sinus: No maxillary sinus tenderness or frontal sinus tenderness.     Left Sinus: No maxillary sinus tenderness or frontal sinus tenderness.  Mouth/Throat:     Mouth: Mucous membranes are moist.     Pharynx: Posterior oropharyngeal erythema (mild) present. No pharyngeal swelling or oropharyngeal exudate.   Eyes:     General: Lids are normal.        Right eye: No discharge.        Left eye: No discharge.     Conjunctiva/sclera: Conjunctivae normal.     Pupils: Pupils are equal, round, and reactive to light.   Neck:     Thyroid : No thyromegaly.     Vascular: No carotid bruit.   Cardiovascular:     Rate and Rhythm: Normal rate and regular rhythm.     Heart sounds: Normal heart sounds. No murmur heard.     No gallop.  Pulmonary:     Effort: Pulmonary effort is normal. No accessory muscle usage or respiratory distress.     Breath sounds: Wheezing present. No decreased breath sounds or rales.     Comments: Expiratory wheezes intermittent throughout. Abdominal:     General: Bowel sounds are normal.     Palpations: Abdomen is soft. There is no hepatomegaly or splenomegaly.   Musculoskeletal:     Cervical back: Normal range of motion and neck supple.     Right lower leg: No edema.     Left lower leg: No edema.  Lymphadenopathy:     Head:     Right side of head: No submental, submandibular, tonsillar, preauricular or posterior auricular adenopathy.     Left side of head: No submental, submandibular, tonsillar, preauricular or posterior auricular adenopathy.     Cervical: No cervical adenopathy.   Skin:    General: Skin is warm and dry.   Neurological:     Mental Status: She is alert and oriented to person, place, and time.   Psychiatric:        Attention and Perception: Attention normal.        Mood and Affect: Mood normal.        Speech: Speech normal.        Behavior: Behavior normal. Behavior is cooperative.        Thought Content: Thought content normal.     Results for orders placed or performed in visit on 04/26/24  Comprehensive metabolic panel with GFR   Collection Time: 04/26/24  9:10 AM  Result Value Ref Range   Glucose 97 70 - 99 mg/dL   BUN 22 8 - 27 mg/dL   Creatinine, Ser 1.61 (H) 0.57 - 1.00 mg/dL   eGFR 38 (L) >09 UE/AVW/0.98   BUN/Creatinine Ratio 15 12 - 28   Sodium 140 134 - 144 mmol/L   Potassium 5.0 3.5 - 5.2 mmol/L   Chloride 105 96 - 106 mmol/L   CO2 23 20 - 29 mmol/L   Calcium  9.8 8.7 - 10.3 mg/dL   Total Protein 7.3 6.0 - 8.5 g/dL   Albumin 4.7 3.9 - 4.9 g/dL   Globulin, Total 2.6 1.5 - 4.5 g/dL   Bilirubin Total 0.2 0.0 - 1.2 mg/dL   Alkaline Phosphatase 113 44 - 121 IU/L   AST 41 (H) 0 - 40 IU/L   ALT 52 (H) 0 - 32 IU/L  Lipid Panel w/o  Chol/HDL Ratio   Collection Time: 04/26/24  9:10 AM  Result Value Ref Range   Cholesterol, Total 142 100 - 199 mg/dL   Triglycerides 80 0 - 149 mg/dL   HDL 51 >11 mg/dL   VLDL Cholesterol Cal 16 5 - 40 mg/dL  LDL Chol Calc (NIH) 75 0 - 99 mg/dL  Microalbumin, Urine Waived   Collection Time: 04/26/24  9:10 AM  Result Value Ref Range   Microalb, Ur Waived 150 (H) 0 - 19 mg/L   Creatinine, Urine Waived 200 10 - 300 mg/dL   Microalb/Creat Ratio 30-300 (H) <30 mg/g  Ferritin   Collection Time: 04/26/24  9:10 AM  Result Value Ref Range   Ferritin 550 (H) 15 - 150 ng/mL  Iron   Collection Time: 04/26/24  9:10 AM  Result Value Ref Range   Iron 65 27 - 139 ug/dL  Vitamin B12   Collection Time: 04/26/24  9:10 AM  Result Value Ref Range   Vitamin B-12 898 232 - 1,245 pg/mL  TSH   Collection Time: 04/26/24  9:10 AM  Result Value Ref Range   TSH 3.390 0.450 - 4.500 uIU/mL      Assessment & Plan:   Problem List Items Addressed This Visit       Respiratory   Acute sinusitis - Primary   Acute for over 7 days.  Start Zpack to assist with ongoing symptoms and inflammation.  Continue OTC cold medications and allergy medications.  Recommend: - Increased rest - Increasing Fluids - Acetaminophen  as needed for fever/pain.  - Salt water gargling, chloraseptic spray and throat lozenges - Mucinex.  - Saline sinus flushes or a neti pot.  - Humidifying the air.       Relevant Medications   azithromycin (ZITHROMAX) 250 MG tablet     Follow up plan: Return for as scheduled.

## 2024-06-08 NOTE — Patient Instructions (Signed)

## 2024-06-08 NOTE — Assessment & Plan Note (Signed)
 Acute for over 7 days.  Start Zpack to assist with ongoing symptoms and inflammation.  Continue OTC cold medications and allergy medications.  Recommend: - Increased rest - Increasing Fluids - Acetaminophen  as needed for fever/pain.  - Salt water gargling, chloraseptic spray and throat lozenges - Mucinex.  - Saline sinus flushes or a neti pot.  - Humidifying the air.

## 2024-06-12 ENCOUNTER — Other Ambulatory Visit: Payer: Self-pay | Admitting: Nurse Practitioner

## 2024-06-13 NOTE — Telephone Encounter (Signed)
 Requested Prescriptions  Pending Prescriptions Disp Refills   rosuvastatin  (CRESTOR ) 40 MG tablet [Pharmacy Med Name: Rosuvastatin  Calcium  Oral Tablet 40 MG] 90 tablet 3    Sig: TAKE 1 TABLET EVERY DAY     Cardiovascular:  Antilipid - Statins 2 Failed - 06/13/2024  3:17 PM      Failed - Cr in normal range and within 360 days    Creatinine, Ser  Date Value Ref Range Status  04/26/2024 1.47 (H) 0.57 - 1.00 mg/dL Final   Creatinine, Urine  Date Value Ref Range Status  12/23/2020 102 mg/dL Final    Comment:    Performed at [facility], [address] [place], [place] [address]         Failed - Lipid Panel in normal range within the last 12 months    Cholesterol, Total  Date Value Ref Range Status  04/26/2024 142 100 - 199 mg/dL Final   Cholesterol Piccolo, Waived  Date Value Ref Range Status  06/03/2015 178 <200 mg/dL Final    Comment:                            Desirable                <200                         Borderline High      200- 239                         High                     >239    LDL Chol Calc (NIH)  Date Value Ref Range Status  04/26/2024 75 0 - 99 mg/dL Final   HDL  Date Value Ref Range Status  04/26/2024 51 >39 mg/dL Final   Triglycerides  Date Value Ref Range Status  04/26/2024 80 0 - 149 mg/dL Final   Triglycerides Piccolo,Waived  Date Value Ref Range Status  06/03/2015 93 <150 mg/dL Final    Comment:                            Normal                   <150                         Borderline High     150 - 199                         High                200 - 499                         Very High                >499          Passed - Patient is not pregnant      Passed - Valid encounter within last 12 months    Recent Outpatient Visits           5 days ago Acute non-recurrent frontal sinusitis   [place] [facility] [place],  Melanie DASEN, NP   1 month ago Stage 3b chronic kidney disease (HCC)   [place]  [facility] [place], Jolene T, NP               omeprazole  (PRILOSEC ) 20 MG capsule [Pharmacy Med Name: Omeprazole  Oral Capsule Delayed Release 20 MG] 90 capsule 3    Sig: TAKE 1 CAPSULE EVERY DAY     Gastroenterology: Proton Pump Inhibitors Passed - 06/13/2024  3:17 PM      Passed - Valid encounter within last 12 months    Recent Outpatient Visits           5 days ago Acute non-recurrent frontal sinusitis   [place] [facility] [place], Hodges T, NP   1 month ago Stage 3b chronic kidney disease (HCC)   [place] [facility] [place], Jolene T, NP               losartan  (COZAAR ) 100 MG tablet [Pharmacy Med Name: Losartan  Potassium Oral Tablet 100 MG] 90 tablet 1    Sig: TAKE 1 TABLET EVERY DAY     Cardiovascular:  Angiotensin Receptor Blockers Failed - 06/13/2024  3:17 PM      Failed - Cr in normal range and within 180 days    Creatinine, Ser  Date Value Ref Range Status  04/26/2024 1.47 (H) 0.57 - 1.00 mg/dL Final   Creatinine, Urine  Date Value Ref Range Status  12/23/2020 102 mg/dL Final    Comment:    Performed at [facility], [address] [place], [place] [address]         Failed - Last BP in normal range    BP Readings from Last 1 Encounters:  06/08/24 (!) 140/82         Passed - K in normal range and within 180 days    Potassium  Date Value Ref Range Status  04/26/2024 5.0 3.5 - 5.2 mmol/L Final         Passed - Patient is not pregnant      Passed - Valid encounter within last 6 months    Recent Outpatient Visits           5 days ago Acute non-recurrent frontal sinusitis   [place] [facility] [place], Pace T, NP   1 month ago Stage 3b chronic kidney disease (HCC)   [place] [facility] [place], Melanie DASEN, NP

## 2024-08-16 ENCOUNTER — Ambulatory Visit: Payer: Self-pay

## 2024-08-16 NOTE — Telephone Encounter (Signed)
 Reason for Disposition ?? Third attempt to contact caller AND no contact made. Phone number verified. ? ?Protocols used: No Contact or Duplicate Contact Call-A-AH ? ?

## 2024-08-16 NOTE — Telephone Encounter (Signed)
 Copied from CRM #[id number]. Topic: Clinical - Red Word Triage >> Aug 16, 2024  4:12 PM Wess RAMAN wrote: Red Word that prompted transfer to Nurse Triage: Patient fell out her bed on last Thursday and has a bruise on her right leg. She also has Arthritis but no pain to her leg  Patient hung up the phone prior to agent transferring the patient to Nurse Triage. This RN placed a call to patient. No answer-voicemail left for patient to call back to office to speak to a member of Nurse Triage. Will place in callbacks.

## 2024-09-15 ENCOUNTER — Telehealth: Payer: Self-pay

## 2024-09-15 NOTE — Telephone Encounter (Signed)
 Copied from CRM [id number]. Topic: Clinical - Medication Question >> Sep 14, 2024  1:47 PM Olam RAMAN wrote: Reason for CRM:  pt calling to to see if she can take turmeric pill and takes bp meds as well C/B [phone] wanted to be sure if can be taken

## 2024-09-18 ENCOUNTER — Telehealth: Payer: Self-pay

## 2024-09-18 NOTE — Telephone Encounter (Signed)
 Copied from CRM [id number]. Topic: Clinical - Medication Question >> Sep 14, 2024  1:47 PM Olam RAMAN wrote: Reason for CRM:  pt calling to to see if she can take turmeric pill and takes bp meds as well C/B [phone] wanted to be sure if can be taken >> Sep 18, 2024 11:10 AM Mia F wrote: Pt called back to see if NP had responded to her message. Per messages, tumeric is ok to take. Message was relayed to pt. She says she will take 500mg  of Tumeric

## 2024-09-19 NOTE — Telephone Encounter (Signed)
 Reviewed with patient

## 2024-09-25 NOTE — Progress Notes (Signed)
 Kelly Poole                                          MRN: [id number]   09/25/2024   The [facility] Quality Team Specialist reviewed this patient medical record for the purposes of chart review for care gap closure. The following were reviewed: chart review for care gap closure-controlling blood pressure.    [facility] Quality Team

## 2024-10-01 ENCOUNTER — Other Ambulatory Visit: Payer: Self-pay | Admitting: Nurse Practitioner

## 2024-10-03 DIAGNOSIS — N1832 Chronic kidney disease, stage 3b: Secondary | ICD-10-CM | POA: Diagnosis not present

## 2024-10-03 DIAGNOSIS — G40909 Epilepsy, unspecified, not intractable, without status epilepticus: Secondary | ICD-10-CM | POA: Diagnosis not present

## 2024-10-03 DIAGNOSIS — I129 Hypertensive chronic kidney disease with stage 1 through stage 4 chronic kidney disease, or unspecified chronic kidney disease: Secondary | ICD-10-CM | POA: Diagnosis not present

## 2024-10-03 DIAGNOSIS — Z7982 Long term (current) use of aspirin: Secondary | ICD-10-CM | POA: Diagnosis not present

## 2024-10-03 DIAGNOSIS — N2581 Secondary hyperparathyroidism of renal origin: Secondary | ICD-10-CM | POA: Diagnosis not present

## 2024-10-03 DIAGNOSIS — M1990 Unspecified osteoarthritis, unspecified site: Secondary | ICD-10-CM | POA: Diagnosis not present

## 2024-10-03 DIAGNOSIS — E785 Hyperlipidemia, unspecified: Secondary | ICD-10-CM | POA: Diagnosis not present

## 2024-10-03 DIAGNOSIS — K219 Gastro-esophageal reflux disease without esophagitis: Secondary | ICD-10-CM | POA: Diagnosis not present

## 2024-10-03 DIAGNOSIS — Z8249 Family history of ischemic heart disease and other diseases of the circulatory system: Secondary | ICD-10-CM | POA: Diagnosis not present

## 2024-10-03 NOTE — Telephone Encounter (Signed)
 Provider aware of abn lab values Requested Prescriptions  Pending Prescriptions Disp Refills   ezetimibe  (ZETIA ) 10 MG tablet [Pharmacy Med Name: Ezetimibe  10 MG Oral Tablet] 90 tablet 0    Sig: Take 1 tablet by mouth once daily     Cardiovascular:  Antilipid - Sterol Transport Inhibitors Failed - 10/03/2024  2:47 PM      Failed - AST in normal range and within 360 days    AST  Date Value Ref Range Status  04/26/2024 41 (H) 0 - 40 IU/L Final         Failed - ALT in normal range and within 360 days    ALT  Date Value Ref Range Status  04/26/2024 52 (H) 0 - 32 IU/L Final         Failed - Lipid Panel in normal range within the last 12 months    Cholesterol, Total  Date Value Ref Range Status  04/26/2024 142 100 - 199 mg/dL Final   Cholesterol Piccolo, Waived  Date Value Ref Range Status  06/03/2015 178 <200 mg/dL Final    Comment:                            Desirable                <200                         Borderline High      200- 239                         High                     >239    LDL Chol Calc (NIH)  Date Value Ref Range Status  04/26/2024 75 0 - 99 mg/dL Final   HDL  Date Value Ref Range Status  04/26/2024 51 >39 mg/dL Final   Triglycerides  Date Value Ref Range Status  04/26/2024 80 0 - 149 mg/dL Final   Triglycerides Piccolo,Waived  Date Value Ref Range Status  06/03/2015 93 <150 mg/dL Final    Comment:                            Normal                   <150                         Borderline High     150 - 199                         High                200 - 499                         Very High                >499          Passed - Patient is not pregnant      Passed - Valid encounter within last 12 months    Recent Outpatient Visits           3 months ago Acute non-recurrent  frontal sinusitis   [facility] [place], [place] T, NP   5 months ago Stage 3b chronic kidney disease [facility])   [facility] [place], Melanie DASEN, NP

## 2024-10-28 NOTE — Patient Instructions (Signed)
 Be Involved in Caring For Your Health:  Taking Medications When medications are taken as directed, they can greatly improve your health. But if they are not taken as prescribed, they may not work. In some cases, not taking them correctly can be harmful. To help ensure your treatment remains effective and safe, understand your medications and how to take them. Bring your medications to each visit for review by your provider.  Your lab results, notes, and after visit summary will be available on My Chart. We strongly encourage you to use this feature. If lab results are abnormal the clinic will contact you with the appropriate steps. If the clinic does not contact you assume the results are satisfactory. You can always view your results on My Chart. If you have questions regarding your health or results, please contact the clinic during office hours. You can also ask questions on My Chart.  We at [facility] are grateful that you chose [place] to provide your care. We strive to provide evidence-based and compassionate care and are always looking for feedback. If you get a survey from the clinic please complete this so we can hear your opinions.  Heart-Healthy Eating Plan Many factors influence your heart health, including eating and exercise habits. Heart health is also called coronary health. Coronary risk increases with abnormal blood fat (lipid) levels. A heart-healthy eating plan includes limiting unhealthy fats, increasing healthy fats, limiting salt (sodium) intake, and making other diet and lifestyle changes. What is my plan? Your health care provider may recommend that: You limit your fat intake to _________% or less of your total calories each day. You limit your saturated fat intake to _________% or less of your total calories each day. You limit the amount of cholesterol in your diet to less than _________ mg per day. You limit the amount of sodium in your diet to less than _________  mg per day. What are tips for following this plan? Cooking Cook foods using methods other than frying. Baking, boiling, grilling, and broiling are all good options. Other ways to reduce fat include: Removing the skin from poultry. Removing all visible fats from meats. Steaming vegetables in water or broth. Meal planning  At meals, imagine dividing your plate into fourths: Fill one-half of your plate with vegetables and green salads. Fill one-fourth of your plate with whole grains. Fill one-fourth of your plate with lean protein foods. Eat 2-4 cups of vegetables per day. One cup of vegetables equals 1 cup (91 g) broccoli or cauliflower florets, 2 medium carrots, 1 large bell pepper, 1 large sweet potato, 1 large tomato, 1 medium white potato, 2 cups (150 g) raw leafy greens. Eat 1-2 cups of fruit per day. One cup of fruit equals 1 small apple, 1 large banana, 1 cup (237 g) mixed fruit, 1 large orange,  cup (82 g) dried fruit, 1 cup (240 mL) 100% fruit juice. Eat more foods that contain soluble fiber. Examples include apples, broccoli, carrots, beans, peas, and barley. Aim to get 25-30 g of fiber per day. Increase your consumption of legumes, nuts, and seeds to 4-5 servings per week. One serving of dried beans or legumes equals  cup (90 g) cooked, 1 serving of nuts is  oz (12 almonds, 24 pistachios, or 7 walnut halves), and 1 serving of seeds equals  oz (8 g). Fats Choose healthy fats more often. Choose monounsaturated and polyunsaturated fats, such as olive and canola oils, avocado oil, flaxseeds, walnuts, almonds, and seeds. Eat  more omega-3 fats. Choose salmon, mackerel, sardines, tuna, flaxseed oil, and ground flaxseeds. Aim to eat fish at least 2 times each week. Check food labels carefully to identify foods with trans fats or high amounts of saturated fat. Limit saturated fats. These are found in animal products, such as meats, butter, and cream. Plant sources of saturated fats  include palm oil, palm kernel oil, and coconut oil. Avoid foods with partially hydrogenated oils in them. These contain trans fats. Examples are stick margarine, some tub margarines, cookies, crackers, and other baked goods. Avoid fried foods. General information Eat more home-cooked food and less restaurant, buffet, and fast food. Limit or avoid alcohol. Limit foods that are high in added sugar and simple starches such as foods made using white refined flour (white breads, pastries, sweets). Lose weight if you are overweight. Losing just 5-10% of your body weight can help your overall health and prevent diseases such as diabetes and heart disease. Monitor your sodium intake, especially if you have high blood pressure. Talk with your health care provider about your sodium intake. Try to incorporate more vegetarian meals weekly. What foods should I eat? Fruits All fresh, canned (in natural juice), or frozen fruits. Vegetables Fresh or frozen vegetables (raw, steamed, roasted, or grilled). Green salads. Grains Most grains. Choose whole wheat and whole grains most of the time. Rice and pasta, including brown rice and pastas made with whole wheat. Meats and other proteins Lean, well-trimmed beef, veal, pork, and lamb. Chicken and [place] without skin. All fish and shellfish. Wild duck, rabbit, pheasant, and venison. Egg whites or low-cholesterol egg substitutes. Dried beans, peas, lentils, and tofu. Seeds and most nuts. Dairy Low-fat or nonfat cheeses, including ricotta and mozzarella. Skim or 1% milk (liquid, powdered, or evaporated). Buttermilk made with low-fat milk. Nonfat or low-fat yogurt. Fats and oils Non-hydrogenated (trans-free) margarines. Vegetable oils, including soybean, sesame, sunflower, olive, avocado, peanut, safflower, corn, canola, and cottonseed. Salad dressings or mayonnaise made with a vegetable oil. Beverages Water (mineral or sparkling). Coffee and tea. Unsweetened ice  tea. Diet beverages. Sweets and desserts Sherbet, gelatin, and fruit ice. Small amounts of dark chocolate. Limit all sweets and desserts. Seasonings and condiments All seasonings and condiments. The items listed above may not be a complete list of foods and beverages you can eat. Contact a dietitian for more options. What foods should I avoid? Fruits Canned fruit in heavy syrup. Fruit in cream or butter sauce. Fried fruit. Limit coconut. Vegetables Vegetables cooked in cheese, cream, or butter sauce. Fried vegetables. Grains Breads made with saturated or trans fats, oils, or whole milk. Croissants. Sweet rolls. Donuts. High-fat crackers, such as cheese crackers and chips. Meats and other proteins Fatty meats, such as hot dogs, ribs, sausage, bacon, rib-eye roast or steak. High-fat deli meats, such as salami and bologna. Caviar. Domestic duck and goose. Organ meats, such as liver. Dairy Cream, sour cream, cream cheese, and creamed cottage cheese. Whole-milk cheeses. Whole or 2% milk (liquid, evaporated, or condensed). Whole buttermilk. Cream sauce or high-fat cheese sauce. Whole-milk yogurt. Fats and oils Meat fat, or shortening. Cocoa butter, hydrogenated oils, palm oil, coconut oil, palm kernel oil. Solid fats and shortenings, including bacon fat, salt pork, lard, and butter. Nondairy cream substitutes. Salad dressings with cheese or sour cream. Beverages Regular sodas and any drinks with added sugar. Sweets and desserts Frosting. Pudding. Cookies. Cakes. Pies. Milk chocolate or white chocolate. Buttered syrups. Full-fat ice cream or ice cream drinks. The items listed above may  not be a complete list of foods and beverages to avoid. Contact a dietitian for more information. Summary Heart-healthy meal planning includes limiting unhealthy fats, increasing healthy fats, limiting salt (sodium) intake and making other diet and lifestyle changes. Lose weight if you are overweight. Losing just  5-10% of your body weight can help your overall health and prevent diseases such as diabetes and heart disease. Focus on eating a balance of foods, including fruits and vegetables, low-fat or nonfat dairy, lean protein, nuts and legumes, whole grains, and heart-healthy oils and fats. This information is not intended to replace advice given to you by your health care provider. Make sure you discuss any questions you have with your health care provider. Document Revised: 01/12/2022 Document Reviewed: 01/12/2022 Elsevier Patient Education  2024 [employer].

## 2024-10-31 ENCOUNTER — Encounter: Payer: Self-pay | Admitting: Nurse Practitioner

## 2024-10-31 ENCOUNTER — Ambulatory Visit (INDEPENDENT_AMBULATORY_CARE_PROVIDER_SITE_OTHER): Admitting: Nurse Practitioner

## 2024-10-31 VITALS — BP 138/80 | HR 73 | Temp 97.8°F | Resp 15 | Ht <= 58 in | Wt 149.4 lb

## 2024-10-31 DIAGNOSIS — N1832 Chronic kidney disease, stage 3b: Secondary | ICD-10-CM | POA: Diagnosis not present

## 2024-10-31 DIAGNOSIS — D518 Other vitamin B12 deficiency anemias: Secondary | ICD-10-CM | POA: Diagnosis not present

## 2024-10-31 DIAGNOSIS — R7989 Other specified abnormal findings of blood chemistry: Secondary | ICD-10-CM

## 2024-10-31 DIAGNOSIS — Z0000 Encounter for general adult medical examination without abnormal findings: Secondary | ICD-10-CM | POA: Diagnosis not present

## 2024-10-31 DIAGNOSIS — I10 Essential (primary) hypertension: Secondary | ICD-10-CM

## 2024-10-31 DIAGNOSIS — K219 Gastro-esophageal reflux disease without esophagitis: Secondary | ICD-10-CM

## 2024-10-31 DIAGNOSIS — N2581 Secondary hyperparathyroidism of renal origin: Secondary | ICD-10-CM

## 2024-10-31 DIAGNOSIS — E049 Nontoxic goiter, unspecified: Secondary | ICD-10-CM | POA: Diagnosis not present

## 2024-10-31 DIAGNOSIS — M8588 Other specified disorders of bone density and structure, other site: Secondary | ICD-10-CM

## 2024-10-31 DIAGNOSIS — Z87898 Personal history of other specified conditions: Secondary | ICD-10-CM

## 2024-10-31 DIAGNOSIS — R801 Persistent proteinuria, unspecified: Secondary | ICD-10-CM | POA: Diagnosis not present

## 2024-10-31 DIAGNOSIS — E7800 Pure hypercholesterolemia, unspecified: Secondary | ICD-10-CM

## 2024-10-31 LAB — MICROALBUMIN, URINE WAIVED
Creatinine, Urine Waived: 200 mg/dL (ref 10–300)
Microalb, Ur Waived: 150 mg/L — ABNORMAL HIGH (ref 0–19)

## 2024-10-31 MED ORDER — OMEPRAZOLE 20 MG PO CPDR: 20.0000 mg | DELAYED_RELEASE_CAPSULE | Freq: Every day | ORAL | 4 refills | Status: AC

## 2024-10-31 MED ORDER — ROSUVASTATIN CALCIUM 40 MG PO TABS: 40.0000 mg | ORAL_TABLET | Freq: Every day | ORAL | 4 refills | Status: AC

## 2024-10-31 MED ORDER — EZETIMIBE 10 MG PO TABS: 10.0000 mg | ORAL_TABLET | Freq: Every day | ORAL | 4 refills | Status: AC

## 2024-10-31 MED ORDER — LOSARTAN POTASSIUM 100 MG PO TABS: 100.0000 mg | ORAL_TABLET | Freq: Every day | ORAL | 4 refills | Status: AC

## 2024-10-31 NOTE — Progress Notes (Signed)
 BP 138/80 (BP Location: Left Arm, Patient Position: Sitting, Cuff Size: Normal)   Pulse 73   Temp 97.8 F (36.6 C) (Oral)   Resp 15   Ht 4' 9.44 (1.459 m)   Wt 149 lb 6.4 oz (67.8 kg)   SpO2 98%   BMI 31.84 kg/m    Subjective:    Patient ID: Kelly Poole, female    DOB: [birth year], 73 y.o.   MRN: [id number]  HPI: Kelly Poole is a 73 y.o. female presenting on 10/31/2024 for annual comprehensive exam. Current medical complaints include:none  She currently lives with: self Menopausal Symptoms: no   HYPERTENSION / HYPERLIPIDEMIA Continues Losartan  100 MG daily, Zetia  10 MG, and Rosuvastatin  40 MG daily.  Took Amlodipine  in past but this made her feel bad. Followed with neurology in past due to a seizure episode, has not had any further.  Last visit with them was 07/12/23. They performed EEG which was stable and stopped her Lamictal. Is to return and see them on 11/08/24. Satisfied with current treatment? yes Duration of hypertension: chronic BP monitoring frequency: occasional BP range: <130/80 BP medication side effects: no Duration of hyperlipidemia: chronic Cholesterol medication side effects: no Cholesterol supplements: none Medication compliance: good compliance Aspirin : yes Recent stressors: no Recurrent headaches: no Visual changes: no Palpitations: no Dyspnea: no Chest pain: no Lower extremity edema: no Dizzy/lightheaded: no  The 10-year ASCVD risk score (Arnett DK, et al., 2019) is: 10.5%   Values used to calculate the score:     Age: 78 years     Clincally relevant sex: Female     Is Non-Hispanic African American: Yes     Diabetic: No     Tobacco smoker: No     Systolic Blood Pressure: 138 mmHg     Is BP treated: Yes     HDL Cholesterol: 51 mg/dL     Total Cholesterol: 142 mg/dL  GERD Takes Prilosec  20 MG daily. Has history of thyroid  goiter. GERD control status: stable  Satisfied with current treatment? yes Heartburn frequency:  none Medication side effects: no  Medication compliance: stable Previous GERD medications: OTC medications Antacid use frequency:  none Dysphagia: no Odynophagia:  no Hematemesis: no Blood in stool: no EGD: no  CHRONIC KIDNEY DISEASE (CKD 3b) Saw nephrology last on 05/20/22. Takes Ferosul and multivitamin daily + B12 for past anemia in CKD. Was followed by hematology, last visit was 06/02/22, to 12/18/24. CKD status: stable Medications renally dose: no Previous renal evaluation: no Pneumovax:  refused Influenza Vaccine:  refused  OSTEOPENIA: Continues Vitamin D  supplement daily, no recent falls or fractures.  DEXA scan done 01/01/22 with T-score -2.3.   Satisfied with current treatment?: yes Adequate calcium  & vitamin D : yes Weight bearing exercises: yes   Depression Screen done today and results listed below:     10/31/2024    8:12 AM 04/26/2024    8:48 AM 10/28/2023    8:50 AM 04/28/2023    9:48 AM 10/22/2022    8:44 AM  Depression screen PHQ 2/9  Decreased Interest 0 0 2 0 0  Down, Depressed, Hopeless 0 0 0 0 0  PHQ - 2 Score 0 0 2 0 0  Altered sleeping 3 3 3 3 2   Tired, decreased energy 3 2 1  0 0  Change in appetite 0 0 0 0 0  Feeling bad or failure about yourself  0 0 0 0 0  Trouble concentrating 0 0 3 0 0  Moving slowly  or fidgety/restless 0 0 1 0 0  Suicidal thoughts 0 0 0 0 0  PHQ-9 Score 6 5  10  3  2    Difficult doing work/chores Not difficult at all Not difficult at all Not difficult at all Not difficult at all Not difficult at all     Data saved with a previous flowsheet row definition      10/31/2024    8:12 AM 04/26/2024    8:49 AM 10/28/2023    8:50 AM 04/28/2023    9:48 AM  GAD 7 : Generalized Anxiety Score  Nervous, Anxious, on Edge 0 0 2 0  Control/stop worrying 1 2 0 0  Worry too much - different things 0 3 0 0  Trouble relaxing 0 0 1 0  Restless 0 0 0 0  Easily annoyed or irritable 1 0 1 1  Afraid - awful might happen 0 0 0 0  Total GAD 7 Score 2 5  4 1   Anxiety Difficulty  Not difficult at all Not difficult at all Not difficult at all      10/31/2024    8:12 AM 04/26/2024    8:48 AM 11/04/2023   10:14 AM 10/28/2023    8:50 AM 04/28/2023    9:47 AM  Fall Risk   Falls in the past year? 0 0 0 0 0  Number falls in past yr: 0 0  0 0  Injury with Fall? 0 0  0 0  Risk for fall due to : No Fall Risks No Fall Risks  No Fall Risks No Fall Risks  Follow up Falls evaluation completed Falls evaluation completed  Falls evaluation completed Falls evaluation completed    Functional Status Survey: Is the patient deaf or have difficulty hearing?: No Does the patient have difficulty seeing, even when wearing glasses/contacts?: No Does the patient have difficulty concentrating, remembering, or making decisions?: No Does the patient have difficulty walking or climbing stairs?: No Does the patient have difficulty dressing or bathing?: No Does the patient have difficulty doing errands alone such as visiting a doctor's office or shopping?: No    Past Medical History:  Past Medical History:  Diagnosis Date   Breast cancer (HCC) 1991   left breast mastectomy. No chemo or rad tx   GERD (gastroesophageal reflux disease)    Heart murmur    High cholesterol    Hypertension    Seizures (HCC)     Surgical History:  Past Surgical History:  Procedure Laterality Date   ABDOMINAL HYSTERECTOMY  1978   MASTECTOMY Left 1991   Due to breast cancer   TUBAL LIGATION      Medications:  Current Outpatient Medications on File Prior to Visit  Medication Sig   Ascorbic Acid (VITAMIN C) 1000 MG tablet Take 1,000 mg by mouth daily.   aspirin  81 MG tablet Take 81 mg by mouth daily.   Cholecalciferol 50 MCG (2000 UT) TABS Take 1 tablet by mouth daily.   FEROSUL 325 (65 Fe) MG tablet TAKE 1 TABLET EVERY DAY WITH BREAKFAST   Garlic 1000 MG CAPS Take 1,000 mg by mouth daily.   Multiple Vitamin (MULTIVITAMINS PO) Take by mouth daily.   vitamin E 100 UNIT capsule  Take 100 Units by mouth daily.   No current facility-administered medications on file prior to visit.    Allergies:  Allergies  Allergen Reactions   Macrobid  [Nitrofurantoin  Macrocrystal] Other (See Comments)    Sore throat and irritation to throat  Social History:  Social History   Socioeconomic History   Marital status: Single    Spouse name: Not on file   Number of children: Not on file   Years of education: Not on file   Highest education level: Not on file  Occupational History   Not on file  Tobacco Use   Smoking status: Never   Smokeless tobacco: Never  Vaping Use   Vaping status: Never Used  Substance and Sexual Activity   Alcohol use: No    Alcohol/week: 0.0 standard drinks of alcohol   Drug use: No   Sexual activity: Yes  Other Topics Concern   Not on file  Social History Narrative   Not on file   Social Drivers of Health   Financial Resource Strain: Low Risk  (10/31/2024)   Overall Financial Resource Strain (CARDIA)    Difficulty of Paying Living Expenses: Not very hard  Food Insecurity: No Food Insecurity (10/31/2024)   Hunger Vital Sign    Worried About Running Out of Food in the Last Year: Never true    Ran Out of Food in the Last Year: Never true  Transportation Needs: No Transportation Needs (10/31/2024)   PRAPARE - Administrator, Civil Service (Medical): No    Lack of Transportation (Non-Medical): No  Physical Activity: Insufficiently Active (10/31/2024)   Exercise Vital Sign    Days of Exercise per Week: 2 days    Minutes of Exercise per Session: 60 min  Stress: No Stress Concern Present (10/31/2024)   [employer] of Occupational Health - Occupational Stress Questionnaire    Feeling of Stress: Only a little  Social Connections: Moderately Isolated (10/31/2024)   Social Connection and Isolation Panel    Frequency of Communication with Friends and Family: Twice a week    Frequency of Social Gatherings with Friends and  Family: Once a week    Attends Religious Services: Never    Database Administrator or Organizations: Yes    Attends Banker Meetings: Never    Marital Status: Divorced  Catering Manager Violence: Not At Risk (10/31/2024)   Humiliation, Afraid, Rape, and Kick questionnaire    Fear of Current or Ex-Partner: No    Emotionally Abused: No    Physically Abused: No    Sexually Abused: No   Social History   Tobacco Use  Smoking Status Never  Smokeless Tobacco Never   Social History   Substance and Sexual Activity  Alcohol Use No   Alcohol/week: 0.0 standard drinks of alcohol    Family History:  Family History  Problem Relation Age of Onset   Stroke Mother    Thyroid  disease Mother    Cancer Father    Diabetes Maternal Aunt    Breast cancer Other     Past medical history, surgical history, medications, allergies, family history and social history reviewed with patient today and changes made to appropriate areas of the chart.   Review of Systems - negative All other ROS negative except what is listed above and in the HPI.      Objective:    BP 138/80 (BP Location: Left Arm, Patient Position: Sitting, Cuff Size: Normal)   Pulse 73   Temp 97.8 F (36.6 C) (Oral)   Resp 15   Ht 4' 9.44 (1.459 m)   Wt 149 lb 6.4 oz (67.8 kg)   SpO2 98%   BMI 31.84 kg/m   Wt Readings from Last 3 Encounters:  10/31/24 149 lb  6.4 oz (67.8 kg)  06/08/24 149 lb (67.6 kg)  04/26/24 148 lb 9.6 oz (67.4 kg)    Physical Exam Vitals and nursing note reviewed. Exam conducted with a chaperone present.  Constitutional:      General: She is awake. She is not in acute distress.    Appearance: Normal appearance. She is well-developed and well-groomed. She is obese. She is not ill-appearing or toxic-appearing.  HENT:     Head: Normocephalic and atraumatic.     Right Ear: Hearing, tympanic membrane, ear canal and external ear normal. No drainage.     Left Ear: Hearing, tympanic  membrane, ear canal and external ear normal. No drainage.     Nose: Nose normal.     Right Sinus: No maxillary sinus tenderness or frontal sinus tenderness.     Left Sinus: No maxillary sinus tenderness or frontal sinus tenderness.     Mouth/Throat:     Mouth: Mucous membranes are moist.     Pharynx: Oropharynx is clear. Uvula midline. No pharyngeal swelling, oropharyngeal exudate or posterior oropharyngeal erythema.  Eyes:     General: Lids are normal.        Right eye: No discharge.        Left eye: No discharge.     Extraocular Movements: Extraocular movements intact.     Conjunctiva/sclera: Conjunctivae normal.     Pupils: Pupils are equal, round, and reactive to light.     Visual Fields: Right eye visual fields normal and left eye visual fields normal.  Neck:     Thyroid : No thyromegaly.     Vascular: No carotid bruit.     Trachea: Trachea normal.  Cardiovascular:     Rate and Rhythm: Normal rate and regular rhythm.     Heart sounds: Normal heart sounds. No murmur heard.    No gallop.  Pulmonary:     Effort: Pulmonary effort is normal. No accessory muscle usage or respiratory distress.     Breath sounds: Normal breath sounds.  Chest:  Breasts:    Right: Normal.     Left: Normal.  Abdominal:     General: Bowel sounds are normal.     Palpations: Abdomen is soft. There is no hepatomegaly or splenomegaly.     Tenderness: There is no abdominal tenderness.  Musculoskeletal:        General: Normal range of motion.     Cervical back: Normal range of motion and neck supple.     Right lower leg: No edema.     Left lower leg: No edema.  Lymphadenopathy:     Head:     Right side of head: No submental, submandibular, tonsillar, preauricular or posterior auricular adenopathy.     Left side of head: No submental, submandibular, tonsillar, preauricular or posterior auricular adenopathy.     Cervical: No cervical adenopathy.     Upper Body:     Right upper body: No supraclavicular,  axillary or pectoral adenopathy.     Left upper body: No supraclavicular, axillary or pectoral adenopathy.  Skin:    General: Skin is warm and dry.     Capillary Refill: Capillary refill takes less than 2 seconds.     Findings: No rash.  Neurological:     Mental Status: She is alert and oriented to person, place, and time.     Gait: Gait is intact.     Deep Tendon Reflexes: Reflexes are normal and symmetric.     Reflex Scores:  Brachioradialis reflexes are 2+ on the right side and 2+ on the left side.      Patellar reflexes are 2+ on the right side and 2+ on the left side. Psychiatric:        Attention and Perception: Attention normal.        Mood and Affect: Mood normal.        Speech: Speech normal.        Behavior: Behavior normal. Behavior is cooperative.        Thought Content: Thought content normal.        Judgment: Judgment normal.       10/28/2023    8:44 AM 10/22/2022    8:36 AM 10/20/2021   11:19 AM 10/14/2020    3:26 PM 10/12/2019    3:23 PM  6CIT Screen  What Year? 0 points 0 points 0 points 0 points 0 points  What month? 0 points 0 points 0 points 0 points 0 points  What time? 0 points 0 points 0 points 0 points 0 points  Count back from 20 2 points 2 points 2 points 0 points 0 points  Months in reverse 0 points 2 points 4 points 0 points 0 points  Repeat phrase 2 points 0 points 6 points 0 points 2 points  Total Score 4 points 4 points 12 points 0 points 2 points    Results for orders placed or performed in visit on 04/26/24  Comprehensive metabolic panel with GFR   Collection Time: 04/26/24  9:10 AM  Result Value Ref Range   Glucose 97 70 - 99 mg/dL   BUN 22 8 - 27 mg/dL   Creatinine, Ser 8.52 (H) 0.57 - 1.00 mg/dL   eGFR 38 (L) >40 fO/fpw/8.26   BUN/Creatinine Ratio 15 12 - 28   Sodium 140 134 - 144 mmol/L   Potassium 5.0 3.5 - 5.2 mmol/L   Chloride 105 96 - 106 mmol/L   CO2 23 20 - 29 mmol/L   Calcium  9.8 8.7 - 10.3 mg/dL   Total Protein 7.3  6.0 - 8.5 g/dL   Albumin 4.7 3.9 - 4.9 g/dL   Globulin, Total 2.6 1.5 - 4.5 g/dL   Bilirubin Total 0.2 0.0 - 1.2 mg/dL   Alkaline Phosphatase 113 44 - 121 IU/L   AST 41 (H) 0 - 40 IU/L   ALT 52 (H) 0 - 32 IU/L  Lipid Panel w/o Chol/HDL Ratio   Collection Time: 04/26/24  9:10 AM  Result Value Ref Range   Cholesterol, Total 142 100 - 199 mg/dL   Triglycerides 80 0 - 149 mg/dL   HDL 51 >60 mg/dL   VLDL Cholesterol Cal 16 5 - 40 mg/dL   LDL Chol Calc (NIH) 75 0 - 99 mg/dL  Microalbumin, Urine Waived   Collection Time: 04/26/24  9:10 AM  Result Value Ref Range   Microalb, Ur Waived 150 (H) 0 - 19 mg/L   Creatinine, Urine Waived 200 10 - 300 mg/dL   Microalb/Creat Ratio 30-300 (H) <30 mg/g  Ferritin   Collection Time: 04/26/24  9:10 AM  Result Value Ref Range   Ferritin 550 (H) 15 - 150 ng/mL  Iron   Collection Time: 04/26/24  9:10 AM  Result Value Ref Range   Iron 65 27 - 139 ug/dL  Vitamin B12   Collection Time: 04/26/24  9:10 AM  Result Value Ref Range   Vitamin B-12 898 232 - 1,245 pg/mL  TSH   Collection Time: 04/26/24  9:10 AM  Result Value Ref Range   TSH 3.390 0.450 - 4.500 uIU/mL      Assessment & Plan:   Problem List Items Addressed This Visit       Cardiovascular and Mediastinum   Hypertension   Chronic, stable.  BP at goal for age on recheck and on her home checks. Will continue Losartan  100 MG.  Recommend she monitor BP at home at least as few mornings a week and document, report to provider if consistent elevation >130/80.  DASH diet focus.  LABS: CBC, CMP and TSH. Could consider adding on Chlorthalidone in future if needed, would benefit BP and bone health.      Relevant Medications   ezetimibe  (ZETIA ) 10 MG tablet   losartan  (COZAAR ) 100 MG tablet   rosuvastatin  (CRESTOR ) 40 MG tablet   Other Relevant Orders   CBC with Differential/Platelet   Comprehensive metabolic panel with GFR   TSH   Microalbumin, Urine Waived     Digestive   Gastroesophageal  reflux disease (Chronic)   Chronic, stable on Omeprazole .  Reports return of symptoms without medication, when has trialed GDR in past.  However, recommend she try to reduce to lowest amount needed dosing. Mag level annually.  Risks of PPI use were discussed with patient including bone loss, C. Diff diarrhea, pneumonia, infections, CKD, electrolyte abnormalities.  Verbalizes understanding and chooses to continue the medication.       Relevant Medications   omeprazole  (PRILOSEC ) 20 MG capsule   Other Relevant Orders   Magnesium      Endocrine   Thyroid  goiter   History of reported by patient.  Check TSH, Free T4 today.      Relevant Orders   TSH   Secondary hyperparathyroidism   Chronic, stable.  Continue to collaborate with nephrology. She is scheduled upcoming for follow-up.  Check labs today.        Musculoskeletal and Integument   Osteopenia of lumbar spine   Ongoing.  Noted on DEXA 01/01/22.  At this time continue Vitamin D  supplement daily and adjust as needed.  Plan on repeat DEXA around 01/01/27.      Relevant Orders   VITAMIN D  25 Hydroxy (Vit-D Deficiency, Fractures)     Genitourinary   CKD (chronic kidney disease) stage 3, GFR 30-59 ml/min (HCC) - Primary   Ongoing, CKD 3b.  Continue Losartan  for kidney protection.  CMP today.  Continue to collaborate with nephrology, appreciate their input. Recommend she increase her water intake daily.  Avoid Ibuprofen. Urine ALB 150 May 2025.      Relevant Orders   Comprehensive metabolic panel with GFR   Microalbumin, Urine Waived     Other   Vitamin B12 deficiency (dietary) anemia   Chronic, stable.  No current supplement as levels have been stable. Check B12 level today and adjust regimen as needed.      Relevant Orders   Vitamin B12   Persistent proteinuria   Ongoing on labs with underlying CKD.  Continue Losartan  for kidney protection.  Urine ALB 150 May 2025.      Relevant Orders   Microalbumin, Urine Waived    Hyperlipidemia   Chronic, ongoing.  Continue current medication regimen and adjust as needed.  Lipid panel today.      Relevant Medications   ezetimibe  (ZETIA ) 10 MG tablet   losartan  (COZAAR ) 100 MG tablet   rosuvastatin  (CRESTOR ) 40 MG tablet   Other Relevant Orders   Comprehensive metabolic panel with GFR  Lipid Panel w/o Chol/HDL Ratio   History of seizure   Chronic, stable at this time.  Continue to collaborate with neurology, is now off Lamictal per neurology.      Elevated ferritin level   Ongoing, saw hematology for this in past and is to return as needed.  Check level today.      Relevant Orders   Ferritin   Iron   Other Visit Diagnoses       Encounter for annual physical exam       Annual physical today with labs and health maintenance reviewed, discussed with patient.        Follow up plan: Return in about 6 months (around 04/30/2025) for HTN/HLD.   LABORATORY TESTING:  - Pap smear: not applicable  IMMUNIZATIONS: is concerned about having seizures with vaccines due to her past experience with this - Tdap: Tetanus vaccination status reviewed: Refused. - Influenza: Refused - Pneumovax: Refused - Prevnar: Refused - HPV: Not applicable - Zostavax vaccine: Has had initial vaccine and will get second one at drugstore  SCREENING: -Mammogram: Up to date -- last 04/14/24 - Colonoscopy: Up to date - Cologuard due next 05/16/26 - Bone Density: Up To Date on 01/01/22 last -Hearing Test: Not applicable  -Spirometry: Not applicable   PATIENT COUNSELING:   Advised to take 1 mg of folate supplement per day if capable of pregnancy.   Sexuality: Discussed sexually transmitted diseases, partner selection, use of condoms, avoidance of unintended pregnancy  and contraceptive alternatives.   Advised to avoid cigarette smoking.  I discussed with the patient that most people either abstain from alcohol or drink within safe limits (<=14/week and <=4 drinks/occasion for  males, <=7/weeks and <= 3 drinks/occasion for females) and that the risk for alcohol disorders and other health effects rises proportionally with the number of drinks per week and how often a drinker exceeds daily limits.  Discussed cessation/primary prevention of drug use and availability of treatment for abuse.   Diet: Encouraged to adjust caloric intake to maintain  or achieve ideal body weight, to reduce intake of dietary saturated fat and total fat, to limit sodium intake by avoiding high sodium foods and not adding table salt, and to maintain adequate dietary potassium and calcium  preferably from fresh fruits, vegetables, and low-fat dairy products.    Stressed the importance of regular exercise  Injury prevention: Discussed safety belts, safety helmets, smoke detector, smoking near bedding or upholstery.   Dental health: Discussed importance of regular tooth brushing, flossing, and dental visits.    NEXT PREVENTATIVE PHYSICAL DUE IN 1 YEAR. Return in about 6 months (around 04/30/2025) for HTN/HLD.

## 2024-10-31 NOTE — Assessment & Plan Note (Signed)
 Ongoing, CKD 3b.  Continue Losartan  for kidney protection.  CMP today.  Continue to collaborate with nephrology, appreciate their input. Recommend she increase her water intake daily.  Avoid Ibuprofen. Urine ALB 150 May 2025.

## 2024-10-31 NOTE — Assessment & Plan Note (Signed)
 Chronic, stable at this time.  Continue to collaborate with neurology, is now off Lamictal per neurology.

## 2024-10-31 NOTE — Assessment & Plan Note (Signed)
Ongoing.  Noted on DEXA 01/01/22.  At this time continue Vitamin D supplement daily and adjust as needed.  Plan on repeat DEXA around 01/01/27.

## 2024-10-31 NOTE — Assessment & Plan Note (Signed)
 Ongoing, saw hematology for this in past and is to return as needed.  Check level today.

## 2024-10-31 NOTE — Assessment & Plan Note (Signed)
History of reported by patient.  Check TSH, Free T4 today.

## 2024-10-31 NOTE — Assessment & Plan Note (Signed)
 Ongoing on labs with underlying CKD.  Continue Losartan  for kidney protection.  Urine ALB 150 May 2025.

## 2024-10-31 NOTE — Assessment & Plan Note (Signed)
 Chronic, stable.  BP at goal for age on recheck and on her home checks. Will continue Losartan  100 MG.  Recommend she monitor BP at home at least as few mornings a week and document, report to provider if consistent elevation >130/80.  DASH diet focus.  LABS: CBC, CMP and TSH. Could consider adding on Chlorthalidone in future if needed, would benefit BP and bone health.

## 2024-10-31 NOTE — Assessment & Plan Note (Signed)
 Chronic, stable.  Continue to collaborate with nephrology. She is scheduled upcoming for follow-up.  Check labs today.

## 2024-10-31 NOTE — Assessment & Plan Note (Signed)
Chronic, stable on Omeprazole.  Reports return of symptoms without medication, when has trialed GDR in past.  However, recommend she try to reduce to lowest amount needed dosing. Mag level annually.  Risks of PPI use were discussed with patient including bone loss, C. Diff diarrhea, pneumonia, infections, CKD, electrolyte abnormalities.  Verbalizes understanding and chooses to continue the medication.

## 2024-10-31 NOTE — Assessment & Plan Note (Signed)
 Chronic, ongoing.  Continue current medication regimen and adjust as needed. Lipid panel today.

## 2024-10-31 NOTE — Assessment & Plan Note (Signed)
Chronic, stable.  No current supplement as levels have been stable. Check B12 level today and adjust regimen as needed. 

## 2024-11-01 ENCOUNTER — Ambulatory Visit: Payer: Self-pay | Admitting: Nurse Practitioner

## 2024-11-01 LAB — CBC WITH DIFFERENTIAL/PLATELET
Basophils Absolute: 0.1 x10E3/uL (ref 0.0–0.2)
Basos: 1 %
EOS (ABSOLUTE): 0.3 x10E3/uL (ref 0.0–0.4)
Eos: 4 %
Hematocrit: 32.1 % — ABNORMAL LOW (ref 34.0–46.6)
Hemoglobin: 10.2 g/dL — ABNORMAL LOW (ref 11.1–15.9)
Immature Grans (Abs): 0 x10E3/uL (ref 0.0–0.1)
Immature Granulocytes: 0 %
Lymphocytes Absolute: 3.1 x10E3/uL (ref 0.7–3.1)
Lymphs: 44 %
MCH: 30.8 pg (ref 26.6–33.0)
MCHC: 31.8 g/dL (ref 31.5–35.7)
MCV: 97 fL (ref 79–97)
Monocytes Absolute: 0.6 x10E3/uL (ref 0.1–0.9)
Monocytes: 9 %
Neutrophils Absolute: 3 x10E3/uL (ref 1.4–7.0)
Neutrophils: 42 %
Platelets: 244 x10E3/uL (ref 150–450)
RBC: 3.31 x10E6/uL — ABNORMAL LOW (ref 3.77–5.28)
RDW: 12.5 % (ref 11.7–15.4)
WBC: 7.1 x10E3/uL (ref 3.4–10.8)

## 2024-11-01 LAB — LIPID PANEL W/O CHOL/HDL RATIO
Cholesterol, Total: 143 mg/dL (ref 100–199)
HDL: 57 mg/dL (ref >39)
LDL Chol Calc (NIH): 72 mg/dL (ref 0–99)
Triglycerides: 71 mg/dL (ref 0–149)
VLDL Cholesterol Cal: 14 mg/dL (ref 5–40)

## 2024-11-01 LAB — COMPREHENSIVE METABOLIC PANEL WITH GFR
ALT: 38 IU/L — ABNORMAL HIGH (ref 0–32)
AST: 35 IU/L (ref 0–40)
Albumin: 4.7 g/dL (ref 3.8–4.8)
Alkaline Phosphatase: 103 IU/L (ref 49–135)
BUN/Creatinine Ratio: 14 (ref 12–28)
BUN: 21 mg/dL (ref 8–27)
Bilirubin Total: 0.2 mg/dL (ref 0.0–1.2)
CO2: 21 mmol/L (ref 20–29)
Calcium: 9.8 mg/dL (ref 8.7–10.3)
Chloride: 105 mmol/L (ref 96–106)
Creatinine, Ser: 1.45 mg/dL — ABNORMAL HIGH (ref 0.57–1.00)
Globulin, Total: 2.8 g/dL (ref 1.5–4.5)
Glucose: 100 mg/dL — ABNORMAL HIGH (ref 70–99)
Potassium: 4.6 mmol/L (ref 3.5–5.2)
Sodium: 140 mmol/L (ref 134–144)
Total Protein: 7.5 g/dL (ref 6.0–8.5)
eGFR: 39 mL/min/1.73 — ABNORMAL LOW (ref >59)

## 2024-11-01 LAB — VITAMIN D 25 HYDROXY (VIT D DEFICIENCY, FRACTURES): Vit D, 25-Hydroxy: 58.6 ng/mL (ref 30.0–100.0)

## 2024-11-01 LAB — FERRITIN: Ferritin: 480 ng/mL — ABNORMAL HIGH (ref 15–150)

## 2024-11-01 LAB — IRON: Iron: 56 ug/dL (ref 27–139)

## 2024-11-01 LAB — MAGNESIUM: Magnesium: 1.8 mg/dL (ref 1.6–2.3)

## 2024-11-01 LAB — VITAMIN B12: Vitamin B-12: 766 pg/mL (ref 232–1245)

## 2024-11-01 LAB — TSH: TSH: 3.55 u[IU]/mL (ref 0.450–4.500)

## 2024-11-01 NOTE — Progress Notes (Signed)
 Contacted via MyChart  Good afternoon Kelly Poole, your labs have returned: - Ferritin remains a little elevated at 480, but has come down a little from last check. - Hemoglobin and hematocrit remain a little low, but iron and B12 stable. Continue any supplements at home. Ensure to follow-up with kidney doctor. - Kidney function continues to show Stage 3b kidney disease, as above please ensure to follow-up with kidney doctor. - Liver function, AST and ALT overall stable. Trending down from last check. - Remainder of labs stable, no medication changes needed. Any questions? Keep being amazing!!  Thank you for allowing me to participate in your care.  I appreciate you. Kindest regards, Jayleon Mcfarlane

## 2024-11-06 ENCOUNTER — Other Ambulatory Visit: Payer: Self-pay | Admitting: Nurse Practitioner

## 2024-11-07 NOTE — Telephone Encounter (Signed)
 Duplicate request.  Requested Prescriptions  Pending Prescriptions Disp Refills   losartan  (COZAAR ) 100 MG tablet [Pharmacy Med Name: LOSARTAN  POTASSIUM 100 MG Oral Tablet] 90 tablet 3    Sig: TAKE 1 TABLET EVERY DAY     Cardiovascular:  Angiotensin Receptor Blockers Failed - 11/07/2024  4:25 PM      Failed - Cr in normal range and within 180 days    Creatinine, Ser  Date Value Ref Range Status  10/31/2024 1.45 (H) 0.57 - 1.00 mg/dL Final   Creatinine, Urine  Date Value Ref Range Status  12/23/2020 102 mg/dL Final    Comment:    Performed at [facility], [address] [place], [place] [address]         Passed - K in normal range and within 180 days    Potassium  Date Value Ref Range Status  10/31/2024 4.6 3.5 - 5.2 mmol/L Final         Passed - Patient is not pregnant      Passed - Last BP in normal range    BP Readings from Last 1 Encounters:  10/31/24 138/80         Passed - Valid encounter within last 6 months    Recent Outpatient Visits           1 week ago Stage 3b chronic kidney disease (HCC)   [facility] [place], Dresden T, NP   5 months ago Acute non-recurrent frontal sinusitis   [facility] [place], Marlin T, NP   6 months ago Stage 3b chronic kidney disease [facility])   [facility] [place], Melanie DASEN, NP

## 2024-11-14 ENCOUNTER — Ambulatory Visit: Payer: Self-pay

## 2024-11-14 VITALS — Ht 59.0 in | Wt 151.0 lb

## 2024-11-14 DIAGNOSIS — Z0000 Encounter for general adult medical examination without abnormal findings: Secondary | ICD-10-CM

## 2024-11-14 DIAGNOSIS — Z1231 Encounter for screening mammogram for malignant neoplasm of breast: Secondary | ICD-10-CM

## 2024-11-14 NOTE — Progress Notes (Signed)
 Chief Complaint  Patient presents with   Medicare Wellness     Subjective:   Kelly Poole is a 73 y.o. female who presents for a Medicare Annual Wellness Visit.  Allergies (verified) Macrobid  [nitrofurantoin  macrocrystal]   History: Past Medical History:  Diagnosis Date   Breast cancer (HCC) 1991   left breast mastectomy. No chemo or rad tx   GERD (gastroesophageal reflux disease)    Heart murmur    High cholesterol    Hypertension    Seizures (HCC)    Past Surgical History:  Procedure Laterality Date   ABDOMINAL HYSTERECTOMY  1978   MASTECTOMY Left 1991   Due to breast cancer   TUBAL LIGATION     Family History  Problem Relation Age of Onset   Stroke Mother    Thyroid  disease Mother    Cancer Father    Diabetes Maternal Aunt    Breast cancer Other    Social History   Occupational History   Not on file  Tobacco Use   Smoking status: Never   Smokeless tobacco: Never  Vaping Use   Vaping status: Never Used  Substance and Sexual Activity   Alcohol use: No    Alcohol/week: 0.0 standard drinks of alcohol   Drug use: No   Sexual activity: Yes   Tobacco Counseling Counseling given: Not Answered  SDOH Screenings   Food Insecurity: No Food Insecurity (11/14/2024)  Housing: Low Risk  (11/14/2024)  Transportation Needs: No Transportation Needs (11/14/2024)  Utilities: Not At Risk (11/14/2024)  Alcohol Screen: Low Risk  (10/31/2024)  Depression (PHQ2-9): Low Risk  (11/14/2024)  Recent Concern: Depression (PHQ2-9) - Medium Risk (10/31/2024)  Financial Resource Strain: Low Risk  (10/31/2024)  Physical Activity: Inactive (11/14/2024)  Social Connections: Socially Isolated (11/14/2024)  Stress: No Stress Concern Present (11/14/2024)  Tobacco Use: Low Risk  (11/14/2024)  Health Literacy: Adequate Health Literacy (11/14/2024)   See flowsheets for full screening details  Depression Screen PHQ 2 & 9 Depression Scale- Over the past 2 weeks, how often have  you been bothered by any of the following problems? Little interest or pleasure in doing things: 0 Feeling down, depressed, or hopeless (PHQ Adolescent also includes...irritable): 0 PHQ-2 Total Score: 0 Trouble falling or staying asleep, or sleeping too much: 1 Feeling tired or having little energy: 2 Poor appetite or overeating (PHQ Adolescent also includes...weight loss): 0 Feeling bad about yourself - or that you are a failure or have let yourself or your family down: 0 Trouble concentrating on things, such as reading the newspaper or watching television (PHQ Adolescent also includes...like school work): 0 Moving or speaking so slowly that other people could have noticed. Or the opposite - being so fidgety or restless that you have been moving around a lot more than usual: 0 Thoughts that you would be better off dead, or of hurting yourself in some way: 0 PHQ-9 Total Score: 3 If you checked off any problems, how difficult have these problems made it for you to do your work, take care of things at home, or get along with other people?: Not difficult at all  Depression Treatment Depression Interventions/Treatment : EYV7-0 Score <4 Follow-up Not Indicated     Goals Addressed               This Visit's Progress     lose weight (pt-stated)        COMPLETED: Weight (lb) < 200 lb (90.7 kg)   151 lb (68.5 kg)  Visit info / Clinical Intake: Medicare Wellness Visit Type:: Subsequent Annual Wellness Visit Persons participating in visit:: patient Medicare Wellness Visit Mode:: Telephone If telephone:: video declined Because this visit was a virtual/telehealth visit:: pt reported vitals If Telephone or Video please confirm:: I connected with the patient using audio enabled telemedicine application and verified that I am speaking with the correct person using two identifiers; I discussed the limitations of evaluation and management by telemedicine; The patient expressed understanding  and agreed to proceed Patient Location:: home Provider Location:: office/clinic Information given by:: patient Interpreter Needed?: No Pre-visit prep was completed: yes AWV questionnaire completed by patient prior to visit?: no Living arrangements:: (!) lives alone Patient's Overall Health Status Rating: good Typical amount of pain: some Does pain affect daily life?: no Are you currently prescribed opioids?: no  Dietary Habits and Nutritional Risks How many meals a day?: 2 Eats fruit and vegetables daily?: yes Most meals are obtained by: preparing own meals In the last 2 weeks, have you had any of the following?: none Diabetic:: no  Functional Status Activities of Daily Living (to include ambulation/medication): Independent Ambulation: Independent with device- listed below Home Assistive Devices/Equipment: Eyeglasses Medication Administration: Independent Home Management: Independent Manage your own finances?: yes Primary transportation is: driving Concerns about vision?: no *vision screening is required for WTM* Concerns about hearing?: no  Fall Screening Falls in the past year?: 1 Number of falls in past year: 0 Was there an injury with Fall?: 0 Fall Risk Category Calculator: 1 Patient Fall Risk Level: Low Fall Risk  Fall Risk Patient at Risk for Falls Due to: History of fall(s); Impaired balance/gait Fall risk Follow up: Falls evaluation completed; Education provided; Falls prevention discussed  Home and Transportation Safety: All rugs have non-skid backing?: yes All stairs or steps have railings?: (!) no (2 steps without handrail) Grab bars in the bathtub or shower?: (!) no Have non-skid surface in bathtub or shower?: yes Good home lighting?: yes Regular seat belt use?: yes Hospital stays in the last year:: no  Cognitive Assessment Difficulty concentrating, remembering, or making decisions? : yes (I forget things sometimes) Will 6CIT or Mini Cog be  Completed: yes What year is it?: 0 points What month is it?: 0 points Give patient an address phrase to remember (5 components): [address] [place] About what time is it?: 0 points Count backwards from 20 to 1: 2 points Say the months of the year in reverse: 0 points Repeat the address phrase from earlier: 4 points 6 CIT Score: 6 points  Advance Directives (For Healthcare) Does Patient Have a Medical Advance Directive?: No Would patient like information on creating a medical advance directive?: No - Patient declined  Reviewed/Updated  Reviewed/Updated: Reviewed All (Medical, Surgical, Family, Medications, Allergies, Care Teams, Patient Goals)        Objective:    Today's Vitals   11/14/24 0803  Weight: 151 lb (68.5 kg)  Height: 4' 11 (1.499 m)   Body mass index is 30.5 kg/m.  Current Medications (verified) Outpatient Encounter Medications as of 11/14/2024  Medication Sig   Ascorbic Acid (VITAMIN C) 1000 MG tablet Take 1,000 mg by mouth daily.   aspirin  81 MG tablet Take 81 mg by mouth daily.   Cholecalciferol 50 MCG (2000 UT) TABS Take 1 tablet by mouth daily.   ezetimibe  (ZETIA ) 10 MG tablet Take 1 tablet (10 mg total) by mouth daily.   FEROSUL 325 (65 Fe) MG tablet TAKE 1 TABLET EVERY DAY WITH  BREAKFAST (Patient taking differently: Doesn't take every day)   Garlic 1000 MG CAPS Take 1,000 mg by mouth daily.   losartan  (COZAAR ) 100 MG tablet Take 1 tablet (100 mg total) by mouth daily.   Multiple Vitamin (MULTIVITAMINS PO) Take by mouth daily.   omeprazole  (PRILOSEC ) 20 MG capsule Take 1 capsule (20 mg total) by mouth daily.   rosuvastatin  (CRESTOR ) 40 MG tablet Take 1 tablet (40 mg total) by mouth daily.   Turmeric (QC TUMERIC COMPLEX) 500 MG CAPS Take 1 capsule by mouth daily.   vitamin E 100 UNIT capsule Take 100 Units by mouth daily.   No facility-administered encounter medications on file as of 11/14/2024.   Hearing/Vision screen Hearing Screening -  Comments:: Denies hearing loss  Vision Screening - Comments:: UTD @ [facility] Immunizations and Health Maintenance Health Maintenance  Topic Date Due   COVID-19 Vaccine (5 - 2025-26 season) 08/21/2024   Zoster Vaccines- Shingrix (2 of 2) 01/28/2025 (Originally 04/22/2024)   Influenza Vaccine  03/20/2025 (Originally 07/21/2024)   Pneumococcal Vaccine: 50+ Years (1 of 2 - PCV) 10/28/2025 (Originally 10/15/1970)   Mammogram  04/14/2025   Medicare Annual Wellness (AWV)  11/14/2025   Fecal DNA (Cologuard)  05/16/2026   Bone Density Scan  01/01/2027   Hepatitis C Screening  Completed   Meningococcal B Vaccine  Aged Out   DTaP/Tdap/Td  Discontinued   Colonoscopy  Discontinued        Assessment/Plan:  This is a routine wellness examination for Kelly Poole.  Patient Care Team: Valerio Melanie DASEN, NP as PCP - General (Nurse Practitioner) Maree Jannett POUR, MD as Consulting Physician (Neurology) [facility], [facility])  I have personally reviewed and noted the following in the patient's chart:   Medical and social history Use of alcohol, tobacco or illicit drugs  Current medications and supplements including opioid prescriptions. Functional ability and status Nutritional status Physical activity Advanced directives List of other physicians Hospitalizations, surgeries, and ER visits in previous 12 months Vitals Screenings to include cognitive, depression, and falls Referrals and appointments  Orders Placed This Encounter  Procedures   MM 3D SCREENING MAMMOGRAM UNILATERAL RIGHT BREAST    Standing Status:   Future    Expected Date:   04/14/2025    Expiration Date:   11/14/2025    Reason for Exam (SYMPTOM  OR DIAGNOSIS REQUIRED):   screening for breast cancer    Preferred imaging location?:   [facility]   In addition, I have reviewed and discussed with patient certain preventive protocols, quality metrics, and best practice recommendations. A  written personalized care plan for preventive services as well as general preventive health recommendations were provided to patient.   Vina Ned, CMA   11/14/2024   Return in 1 year (on 11/22/2025).  After Visit Summary: (MyChart) Due to this being a telephonic visit, the after visit summary with patients personalized plan was offered to patient via MyChart   Nurse Notes:  6 CIT Score - 6 Placed order for MMG (due ~ 04/14/25) Plans to get 2nd Shingles vaccine (pharmacy) Declined flu, covid, Tdap and pneumonia vaccines.

## 2024-11-14 NOTE — Patient Instructions (Signed)
 Kelly Poole,  Thank you for taking the time for your Medicare Wellness Visit. I appreciate your continued commitment to your health goals. Please review the care plan we discussed, and feel free to reach out if I can assist you further.  Please note that Annual Wellness Visits do not include a physical exam. Some assessments may be limited, especially if the visit was conducted virtually. If needed, we may recommend an in-person follow-up with your provider.  Ongoing Care Seeing your primary care provider every 3 to 6 months helps us  monitor your health and provide consistent, personalized care.   Referrals If a referral was made during today's visit and you haven't received any updates within two weeks, please contact the referred provider directly to check on the status.  Recommended Screenings:  Get the 2nd Shingles vaccine.  Please call to schedule your mammogram (due after 04/14/25):  [facility] Address: [address], [place], [place] Phone: [phone]  [facility] [address] [place], [place] [address] Phone: [phone]   Health Maintenance  Topic Date Due   COVID-19 Vaccine (5 - 2025-26 season) 08/21/2024   Medicare Annual Wellness Visit  10/27/2024   Zoster (Shingles) Vaccine (2 of 2) 01/28/2025*   Flu Shot  03/20/2025*   Pneumococcal Vaccine for age over 83 (1 of 2 - PCV) 10/28/2025*   Breast Cancer Screening  04/14/2025   Cologuard (Stool DNA test)  05/16/2026   Osteoporosis screening with Bone Density Scan  01/01/2027   Hepatitis C Screening  Completed   Meningitis B Vaccine  Aged Out   DTaP/Tdap/Td vaccine  Discontinued   Colon Cancer Screening  Discontinued  *Topic was postponed. The date shown is not the original due date.       11/14/2024    8:07 AM  Advanced Directives  Does Patient Have a Medical Advance Directive? No  Would patient like information on creating a  medical advance directive? No - Patient declined    Vision: Annual vision screenings are recommended for early detection of glaucoma, cataracts, and diabetic retinopathy. These exams can also reveal signs of chronic conditions such as diabetes and high blood pressure.  Dental: Annual dental screenings help detect early signs of oral cancer, gum disease, and other conditions linked to overall health, including heart disease and diabetes.  Please see the attached documents for additional preventive care recommendations.

## 2024-12-06 ENCOUNTER — Other Ambulatory Visit: Payer: Self-pay | Admitting: Nurse Practitioner

## 2024-12-08 NOTE — Telephone Encounter (Signed)
 Too soon for refill, LRF 10/31/24.  Requested Prescriptions  Pending Prescriptions Disp Refills   omeprazole  (PRILOSEC ) 20 MG capsule [Pharmacy Med Name: OMEPRAZOLE  DR 20 MG Oral Capsule Delayed Release] 90 capsule 3    Sig: TAKE 1 CAPSULE EVERY DAY     Gastroenterology: Proton Pump Inhibitors Passed - 12/08/2024  4:29 PM      Passed - Valid encounter within last 12 months    Recent Outpatient Visits           1 month ago Stage 3b chronic kidney disease (HCC)   [place] [facility] [place], Smiley T, NP   6 months ago Acute non-recurrent frontal sinusitis   [place] [facility] [place], [place]erbe T, NP   7 months ago Stage 3b chronic kidney disease (HCC)   [place] [facility] [place], Jolene T, NP               losartan  (COZAAR ) 100 MG tablet [Pharmacy Med Name: LOSARTAN  POTASSIUM 100 MG Oral Tablet] 90 tablet 3    Sig: TAKE 1 TABLET EVERY DAY     Cardiovascular:  Angiotensin Receptor Blockers Failed - 12/08/2024  4:29 PM      Failed - Cr in normal range and within 180 days    Creatinine, Ser  Date Value Ref Range Status  10/31/2024 1.45 (H) 0.57 - 1.00 mg/dL Final   Creatinine, Urine  Date Value Ref Range Status  12/23/2020 102 mg/dL Final    Comment:    Performed at [facility], [address] [place], [place] [address]         Passed - K in normal range and within 180 days    Potassium  Date Value Ref Range Status  10/31/2024 4.6 3.5 - 5.2 mmol/L Final         Passed - Patient is not pregnant      Passed - Last BP in normal range    BP Readings from Last 1 Encounters:  10/31/24 138/80         Passed - Valid encounter within last 6 months    Recent Outpatient Visits           1 month ago Stage 3b chronic kidney disease (HCC)   [place] [facility] [place], [place] T, NP   6 months ago Acute non-recurrent frontal sinusitis   [place] [facility] [place], [place] T,  NP   7 months ago Stage 3b chronic kidney disease [facility])   [place] [facility] [place], Melanie DASEN, NP

## 2024-12-12 ENCOUNTER — Ambulatory Visit: Payer: Self-pay

## 2024-12-12 NOTE — Telephone Encounter (Signed)
 FYI Only or Action Required?: Action required by provider: clinical question for provider.  Asking if okay to take Aleve for arthritic knee pain.  History of CKD.  Denies any concerns or acute changes. Please advise if okay to take via MyChart, thanks!   Patient was last seen in primary care on 10/31/2024 by Valerio Melanie DASEN, NP.  Called Nurse Triage reporting Arthritis.  Symptoms began chronic/ongoing.  Interventions attempted: OTC medications: Tylenol .  Symptoms are: stable.  Triage Disposition: Call PCP When Office is Open- await response from provider.   Patient/caregiver understands and will follow disposition?: Yes     Reason for Disposition  Caller wants to use a complementary or alternative medicine  Answer Assessment - Initial Assessment Questions NAME of MEDICINE: What medicine(s) are you calling about?     Aleve  QUESTION: What is your question? (e.g., double dose of medicine, side effect)     Is it okay for patient to take Aleve for arthritis knee pain?   SYMPTOMS: Do you have any symptoms? If Yes, ask: What symptoms are you having?  How bad are the symptoms (e.g., mild, moderate, severe)     Chronic arthritic pain to knee.  Denies any concerns or changes today, inquires if can take Aleve for pain. No allergy noted on chart; however, pt does have a history of chronic kidney disease (CKD).  Thus advised patient, Triage RN will send over message to provider.  Advised do not take any NSAIDs (Ibuprofen, Aleve, Motrin) until further advised by provider due to CKD.   Patient will monitor MyChart for response.  Plans to take Tylenol .  Encouraged only taking Tylenol  and make sure there are no NSAIDs in any OTC medication she takes until further advised.  States understanding.  Protocols used: Medication Question Call-A-AH

## 2024-12-12 NOTE — Telephone Encounter (Signed)
 Left message, 1st attempt    Copied from CRM #[id number]. Topic: Clinical - Medication Question >> Dec 12, 2024  3:18 PM Emylou G wrote: Reason for CRM: patient called.. wants to know if she can take aleve for her arthritis for knee.SABRA

## 2024-12-13 NOTE — Telephone Encounter (Signed)
 Discussed with patient and she agrees to use only when needed rather than as 1st resort.

## 2025-05-01 ENCOUNTER — Ambulatory Visit: Admitting: Nurse Practitioner

## 2025-11-22 ENCOUNTER — Ambulatory Visit
